# Patient Record
Sex: Male | Born: 1970 | Race: Black or African American | Hispanic: No | Marital: Single | Smoking: Light tobacco smoker
Health system: Southern US, Community
[De-identification: ages and names within clinical notes are randomized; demographics above are authoritative.]

## PROBLEM LIST (undated history)

## (undated) DIAGNOSIS — I5022 Chronic systolic (congestive) heart failure: Secondary | ICD-10-CM

## (undated) DIAGNOSIS — I10 Essential (primary) hypertension: Secondary | ICD-10-CM

## (undated) DIAGNOSIS — E669 Obesity, unspecified: Secondary | ICD-10-CM

## (undated) DIAGNOSIS — I429 Cardiomyopathy, unspecified: Secondary | ICD-10-CM

## (undated) DIAGNOSIS — I428 Other cardiomyopathies: Secondary | ICD-10-CM

## (undated) DIAGNOSIS — G4730 Sleep apnea, unspecified: Secondary | ICD-10-CM

## (undated) DIAGNOSIS — E785 Hyperlipidemia, unspecified: Secondary | ICD-10-CM

## (undated) HISTORY — DX: Chronic systolic (congestive) heart failure: I50.22

## (undated) HISTORY — DX: Hyperlipidemia, unspecified: E78.5

## (undated) HISTORY — PX: VASECTOMY: SHX75

## (undated) HISTORY — DX: Cardiomyopathy, unspecified: I42.9

## (undated) HISTORY — DX: Other cardiomyopathies: I42.8

## (undated) HISTORY — DX: Obesity, unspecified: E66.9

---

## 1998-11-18 ENCOUNTER — Emergency Department (HOSPITAL_COMMUNITY): Admission: EM | Admit: 1998-11-18 | Discharge: 1998-11-18 | Payer: Self-pay | Admitting: Emergency Medicine

## 2000-06-06 ENCOUNTER — Other Ambulatory Visit: Admission: RE | Admit: 2000-06-06 | Discharge: 2000-06-06 | Payer: Self-pay | Admitting: Family Medicine

## 2006-06-24 ENCOUNTER — Emergency Department (HOSPITAL_COMMUNITY): Admission: EM | Admit: 2006-06-24 | Discharge: 2006-06-24 | Payer: Self-pay | Admitting: Emergency Medicine

## 2012-12-22 ENCOUNTER — Encounter (HOSPITAL_COMMUNITY): Payer: Self-pay | Admitting: *Deleted

## 2012-12-22 ENCOUNTER — Inpatient Hospital Stay (HOSPITAL_COMMUNITY)
Admission: EM | Admit: 2012-12-22 | Discharge: 2012-12-26 | DRG: 293 | Disposition: A | Discharge status: Home Health | Payer: MEDICAID | Attending: Internal Medicine | Admitting: Internal Medicine

## 2012-12-22 ENCOUNTER — Emergency Department (INDEPENDENT_AMBULATORY_CARE_PROVIDER_SITE_OTHER)
Admission: EM | Admit: 2012-12-22 | Discharge: 2012-12-22 | Disposition: A | Discharge status: Nursing Home (Includes ICF) | Payer: Self-pay | Source: Home / Self Care

## 2012-12-22 ENCOUNTER — Emergency Department (HOSPITAL_COMMUNITY): Payer: Self-pay

## 2012-12-22 ENCOUNTER — Encounter (HOSPITAL_COMMUNITY): Payer: Self-pay | Admitting: Neurology

## 2012-12-22 DIAGNOSIS — R0609 Other forms of dyspnea: Secondary | ICD-10-CM

## 2012-12-22 DIAGNOSIS — I509 Heart failure, unspecified: Secondary | ICD-10-CM

## 2012-12-22 DIAGNOSIS — I10 Essential (primary) hypertension: Secondary | ICD-10-CM

## 2012-12-22 DIAGNOSIS — R011 Cardiac murmur, unspecified: Secondary | ICD-10-CM | POA: Diagnosis present

## 2012-12-22 DIAGNOSIS — F17200 Nicotine dependence, unspecified, uncomplicated: Secondary | ICD-10-CM

## 2012-12-22 DIAGNOSIS — R600 Localized edema: Secondary | ICD-10-CM

## 2012-12-22 DIAGNOSIS — R609 Edema, unspecified: Secondary | ICD-10-CM

## 2012-12-22 DIAGNOSIS — E876 Hypokalemia: Secondary | ICD-10-CM | POA: Diagnosis present

## 2012-12-22 DIAGNOSIS — R0600 Dyspnea, unspecified: Secondary | ICD-10-CM

## 2012-12-22 DIAGNOSIS — G4733 Obstructive sleep apnea (adult) (pediatric): Secondary | ICD-10-CM | POA: Diagnosis present

## 2012-12-22 DIAGNOSIS — R000 Tachycardia, unspecified: Secondary | ICD-10-CM

## 2012-12-22 DIAGNOSIS — Z720 Tobacco use: Secondary | ICD-10-CM | POA: Diagnosis present

## 2012-12-22 HISTORY — DX: Essential (primary) hypertension: I10

## 2012-12-22 HISTORY — DX: Sleep apnea, unspecified: G47.30

## 2012-12-22 LAB — COMPREHENSIVE METABOLIC PANEL
Albumin: 3.2 g/dL — ABNORMAL LOW (ref 3.5–5.2)
BUN: 15 mg/dL (ref 6–23)
Calcium: 9.2 mg/dL (ref 8.4–10.5)
Chloride: 105 mEq/L (ref 96–112)
Sodium: 141 mEq/L (ref 135–145)
Total Bilirubin: 0.9 mg/dL (ref 0.3–1.2)

## 2012-12-22 LAB — POCT I-STAT TROPONIN I

## 2012-12-22 LAB — CBC WITH DIFFERENTIAL/PLATELET
Eosinophils Relative: 1 % (ref 0–5)
HCT: 47 % (ref 39.0–52.0)
Hemoglobin: 15.4 g/dL (ref 13.0–17.0)
Lymphocytes Relative: 40 % (ref 12–46)
Lymphs Abs: 2.3 10*3/uL (ref 0.7–4.0)
MCH: 28.8 pg (ref 26.0–34.0)
Neutro Abs: 2.5 10*3/uL (ref 1.7–7.7)
RBC: 5.35 MIL/uL (ref 4.22–5.81)

## 2012-12-22 LAB — D-DIMER, QUANTITATIVE: D-Dimer, Quant: 2.56 ug/mL-FEU — ABNORMAL HIGH (ref 0.00–0.48)

## 2012-12-22 MED ORDER — FUROSEMIDE 10 MG/ML IJ SOLN
INTRAMUSCULAR | Status: AC
  Filled 2012-12-22: qty 2

## 2012-12-22 MED ORDER — FUROSEMIDE 10 MG/ML IJ SOLN
40.0000 mg | INTRAMUSCULAR | Status: AC
  Administered 2012-12-22: 40 mg via INTRAVENOUS
  Filled 2012-12-22: qty 4

## 2012-12-22 MED ORDER — SODIUM CHLORIDE 0.9 % IJ SOLN
3.0000 mL | Freq: Two times a day (BID) | INTRAMUSCULAR | Status: DC
Start: 2012-12-22 — End: 2012-12-26
  Administered 2012-12-22 – 2012-12-26 (×8): 3 mL via INTRAVENOUS

## 2012-12-22 MED ORDER — ACETAMINOPHEN 650 MG RE SUPP: 650.0000 mg | Freq: Four times a day (QID) | RECTAL | Status: DC | PRN

## 2012-12-22 MED ORDER — FUROSEMIDE 10 MG/ML IJ SOLN
40.0000 mg | Freq: Two times a day (BID) | INTRAMUSCULAR | Status: DC
  Administered 2012-12-23 – 2012-12-24 (×3): 40 mg via INTRAVENOUS
  Filled 2012-12-22 (×6): qty 4

## 2012-12-22 MED ORDER — ONDANSETRON HCL 4 MG/2ML IJ SOLN: 4.0000 mg | Freq: Four times a day (QID) | INTRAMUSCULAR | Status: DC | PRN

## 2012-12-22 MED ORDER — ACETAMINOPHEN 325 MG PO TABS: 650.0000 mg | ORAL_TABLET | Freq: Four times a day (QID) | ORAL | Status: DC | PRN

## 2012-12-22 MED ORDER — NITROGLYCERIN 2 % TD OINT
1.0000 [in_us] | TOPICAL_OINTMENT | Freq: Once | TRANSDERMAL | Status: AC
  Administered 2012-12-22: 1 [in_us] via TOPICAL
  Filled 2012-12-22: qty 1

## 2012-12-22 MED ORDER — NITROGLYCERIN IN D5W 200-5 MCG/ML-% IV SOLN
5.0000 ug/min | Freq: Once | INTRAVENOUS | Status: AC
  Administered 2012-12-22: 5 ug/min via INTRAVENOUS
  Filled 2012-12-22: qty 250

## 2012-12-22 MED ORDER — SODIUM CHLORIDE 0.9 % IV SOLN
Freq: Once | INTRAVENOUS | Status: AC
  Administered 2012-12-22: 11:00:00 via INTRAVENOUS

## 2012-12-22 MED ORDER — ONDANSETRON HCL 4 MG PO TABS: 4.0000 mg | ORAL_TABLET | Freq: Four times a day (QID) | ORAL | Status: DC | PRN

## 2012-12-22 MED ORDER — ASPIRIN 81 MG PO CHEW
324.0000 mg | CHEWABLE_TABLET | Freq: Once | ORAL | Status: AC
  Administered 2012-12-22: 324 mg via ORAL
  Filled 2012-12-22: qty 4

## 2012-12-22 MED ORDER — FUROSEMIDE 10 MG/ML IJ SOLN
40.0000 mg | Freq: Once | INTRAMUSCULAR | Status: AC
  Administered 2012-12-22: 40 mg via INTRAVENOUS

## 2012-12-22 MED ORDER — ENOXAPARIN SODIUM 40 MG/0.4ML ~~LOC~~ SOLN
40.0000 mg | SUBCUTANEOUS | Status: DC
  Administered 2012-12-22 – 2012-12-25 (×4): 40 mg via SUBCUTANEOUS
  Filled 2012-12-22 (×5): qty 0.4

## 2012-12-22 MED ORDER — FUROSEMIDE 10 MG/ML IJ SOLN
40.0000 mg | Freq: Two times a day (BID) | INTRAMUSCULAR | Status: DC
  Filled 2012-12-22: qty 4

## 2012-12-22 NOTE — ED Provider Notes (Signed)
History     CSN: [id number]  Arrival date & time 12/22/12  1134   First MD Initiated Contact with Patient 12/22/12 1152      Chief Complaint  Patient presents with  . Leg Swelling  . Shortness of Breath    (Consider location/radiation/quality/duration/timing/severity/associated sxs/prior treatment) HPI Comments: Patient is a 41 year old male with a history of hypertension who presents with a 1 month history of SOB and bilateral leg swelling. Symptoms started gradually and progressively worsened since the onset. Today, the symptoms became worse. Associated symptoms include abdominal bloating, orthopnea, and palpitations. Patient is not followed by a physician and denies any other medical history. No alleviating factors. Patient has not tried anything for symptoms.    Past Medical History  Diagnosis Date  . Hypertension     Past Surgical History  Procedure Date  . Vasectomy     No family history on file.  History  Substance Use Topics  . Smoking status: Current Every Day Smoker  . Smokeless tobacco: Not on file  . Alcohol Use: Yes      Review of Systems  Allergies  Review of patient's allergies indicates no known allergies.  Home Medications  No current outpatient prescriptions on file.  BP 158/93  Pulse 117  Temp 98.6 F (37 C) (Oral)  Resp 18  SpO2 99%  Physical Exam  Nursing note and vitals reviewed. Constitutional: He is oriented to person, place, and time. He appears well-developed and well-nourished. No distress.  HENT:  Head: Normocephalic and atraumatic.  Eyes: Conjunctivae normal and EOM are normal. Pupils are equal, round, and reactive to light. No scleral icterus.  Neck: Normal range of motion. Neck supple.  Cardiovascular: Normal rate and regular rhythm.  Exam reveals no gallop and no friction rub.   No murmur heard. Pulmonary/Chest: Effort normal. He has no wheezes. He has rales. He exhibits no tenderness.       Rales at bilateral lung  bases.   Abdominal: Soft. He exhibits distension. There is no tenderness. There is no rebound and no guarding.       Notable abdomen distension.   Musculoskeletal: Normal range of motion.  Neurological: He is alert and oriented to person, place, and time. Coordination normal.       Speech is goal-oriented. Moves limbs without ataxia.   Skin: Skin is warm and dry.  Psychiatric: He has a normal mood and affect. His behavior is normal.    ED Course  Procedures (including critical care time)  CRITICAL CARE Performed by: Emilia Beck   Total critical care time: 30 min  Critical care time was exclusive of separately billable procedures and treating other patients.  Critical care was necessary to treat or prevent imminent or life-threatening deterioration.  Critical care was time spent personally by me on the following activities: development of treatment plan with patient and/or surrogate as well as nursing, discussions with consultants, evaluation of patient's response to treatment, examination of patient, obtaining history from patient or surrogate, ordering and performing treatments and interventions, ordering and review of laboratory studies, ordering and review of radiographic studies, pulse oximetry and re-evaluation of patient's condition.   Labs Reviewed  CBC WITH DIFFERENTIAL - Abnormal; Notable for the following:    Monocytes Relative 15 (*)     All other components within normal limits  COMPREHENSIVE METABOLIC PANEL - Abnormal; Notable for the following:    Albumin 3.2 (*)     AST 91 (*)     ALT  69 (*)     GFR calc non Af Amer 78 (*)     All other components within normal limits  PRO B NATRIURETIC PEPTIDE - Abnormal; Notable for the following:    Pro B Natriuretic peptide (BNP) 2210.0 (*)     All other components within normal limits  D-DIMER, QUANTITATIVE - Abnormal; Notable for the following:    D-Dimer, Quant 2.56 (*)     All other components within normal limits    Dg Chest 2 View  12/22/2012  *RADIOLOGY REPORT*  Clinical Data: Leg swelling and shortness of breath.  CHEST - 2 VIEW  Comparison: None.  Findings: The heart is enlarged.  There is mild tortuosity and ectasia of the thoracic aorta.  There is central vascular congestion, perihilar pulmonary edema and small effusions consistent with CHF.  No infiltrates or pneumothorax.  The bony thorax is intact.  IMPRESSION: CHF.   Original Report Authenticated By: Rudie Meyer, M.D.      1. CHF (congestive heart failure)       MDM  12:01 PM Labs pending. Chest xray pending. Patient will have aspirin here.   3:58 PM Patient experiencing CHF with elevated BNP and concerning chest xray for CHF. I will call for admission.   4:10 PM Dr. Fonnie Jarvis will admit the patient.    Emilia Beck, PA-C 12/22/12 1610

## 2012-12-22 NOTE — Progress Notes (Signed)
Patient BP elevated on admission to 163/105. BP recheck 157/92. MD notified and no new orders placed. RN will continue to monitor. Louretta Parma, RN

## 2012-12-22 NOTE — ED Provider Notes (Signed)
Medical screening examination/treatment/procedure(s) were conducted as a shared visit with non-physician practitioner(s) and myself.  I personally evaluated the patient during the encounter.  Gradual onset DOE, weight gain, edema over last couple weeks, no Hx CHF, voided 3L in ED feeling better but still rales halfway up bilat and periph edema, await trop, plan admit request unassigned IM. RA sat 95% in ED after initial diuresis. 1605  D/w OPC for admit.  ECG: Sinus tachycardia, ventricular 14, right axis deviation, borderline R-wave progression, nonspecific T wave changes, no comparison ECG available  Hurman Horn, MD 12/24/12 2325

## 2012-12-22 NOTE — ED Notes (Signed)
Per EMS- Pt comes from [facility] c/o leg swelling and sob x 1 month. CXR showing fluid in lungs. Given 40 mg lasix. Pt alert and oriented. BP 156/114, HR 122, 99% RA.

## 2012-12-22 NOTE — H&P (Signed)
Hospital Admission Note Date: 12/22/2012  Patient name: Colin Walton Medical record number: [id number] Date of birth: 02/17/1971 Age: 41 y.o. Gender: male PCP: Default, Provider, MD  Medical Service: Internal Medicine Teaching Service  Attending physician:  Dr. Criselda Peaches   1st Contact:  Dr. Sherrine Maples   Pager: [phone] 2nd Contact:  Dr. Dorise Hiss   Pager: [phone] After 5 pm or weekends: 1st Contact:      Pager: [phone] 2nd Contact:      Pager: [phone]  Chief Complaint: New onset CHF  History of Present Illness: 41 year old male with PMH HTN presents with a 1 month history of SOB and bilateral leg swelling. Symptoms started gradually and progressively worsened since the onset. Sx began on Dec 7th with bilateral feet swelling that he thought was due to an allergic reaction. His edema persisted and he began to develop distention of his abdomen and SOB. His symptoms have become progressively worse, so he came to the ED. He endorses worsening orthopnea and states that last night he had to sleep sitting straight up. He has been sleeping sitting up for the past several nights and is not having any cough. He is not having any chest pain. He states that he has family history of heart problems but he is unable to clarify what those may be. He denies history of heart problems or heart attack. He states he has never been tested for sugar or cholesterol problems. He admits to drinking a 40 oz beer daily with "much more" on the weekends. He denies fevers, headaches, chest pain, cough, nausea, vomiting, change in bowel habits, burning on urination, or joint pain.     Meds: None  Allergies: Allergies as of 12/22/2012  . (No Known Allergies)   Past Medical History  Diagnosis Date  . Hypertension    Past Surgical History  Procedure Date  . Vasectomy    No family history on file. History   Social History  . Marital Status: Single    Spouse Name: N/A    Number of Children: N/A  . Years of  Education: N/A   Occupational History  . Not on file.   Social History Main Topics  . Smoking status: Current Every Day Smoker  . Smokeless tobacco: Not on file  . Alcohol Use: Yes  . Drug Use: Yes    Special: Marijuana  . Sexually Active:    Other Topics Concern  . Not on file   Social History Narrative  . No narrative on file    Review of Systems: A 10 point ROS was performed; pertinent positives and negatives were noted in the HPI   Physical Exam: Blood pressure 141/89, pulse 116, temperature 98.6 F (37 C), temperature source Oral, resp. rate 24, SpO2 94.00%. General: Alert & oriented x 3, well-developed, and cooperative on examination.  Head: Normocephalic and atraumatic.  Eyes: Vision grossly intact, pupils equal, round, and reactive to light.  Mouth: Pharynx pink and moist Neck: Supple, full ROM Lungs: Breath sounds diminished at the bases, increased respiratory effort, no accessory muscle use, no crackles, and no wheezes. Heart: Regular rate, regular rhythm, blowing systolic mid sternal murmur, no gallop, and no rub.  Abdomen: Distended, TTP at umbilicius, normal bowel sounds, no guarding, no rebound tenderness, no organomegaly, no palpable hernia.  Msk: No joint swelling, warmth, or erythema.  Extremities: 2+ radial and DP pulses bilaterally. 2+ pitting edema of BLE into feet. Neurologic: Alert & oriented X3, cranial nerves II-XII intact, strength normal in  all extremities Skin: Turgor normal and no rashes.  Psych: Memory intact for recent and remote, normally interactive, good eye contact   Lab results: Basic Metabolic Panel:  Basename 12/22/12 1210  NA 141  K 4.5  CL 105  CO2 24  GLUCOSE 94  BUN 15  CREATININE 1.15  CALCIUM 9.2  MG --  PHOS --   Liver Function Tests:  Basename 12/22/12 1210  AST 91*  ALT 69*  ALKPHOS 76  BILITOT 0.9  PROT 6.6  ALBUMIN 3.2*   CBC:  Basename 12/22/12 1210  WBC 5.7  NEUTROABS 2.5  HGB 15.4  HCT 47.0    MCV 87.9  PLT 226   Cardiac Enzymes: POCT troponin 0.05 (negative)  BNP:  Basename 12/22/12 1210  PROBNP 2210.0*   D-Dimer:  Basename 12/22/12 1210  DDIMER 2.56*   Imaging results:  Dg Chest 2 View  12/22/2012  *RADIOLOGY REPORT*  Clinical Data: Leg swelling and shortness of breath.  CHEST - 2 VIEW  Comparison: None.  Findings: The heart is enlarged.  There is mild tortuosity and ectasia of the thoracic aorta.  There is central vascular congestion, perihilar pulmonary edema and small effusions consistent with CHF.  No infiltrates or pneumothorax.  The bony thorax is intact.  IMPRESSION: CHF.   Original Report Authenticated By: Rudie Meyer, M.D.     Other results: EKG: there are no previous tracings available for comparison, normal sinus rhythm, poor R wave progression and slightly low voltage.  Assessment & Plan by Problem:   Acute CHF- Lasix 40 mg IV given in the ED produced good diuresis (3L) and he still has a lot of fluid additionally. Will also check TSH, HgA1c, ECHO, FLP. Will use 40 mg BID IV lasix to start and will titrate according to output. Will monitor I/O and daily weights. May get CHF team consult given new diagnosis. Unclear etiology although has had uncontrolled hypertension for a number of years and may have some significant alcohol consumption. There is a blowing murmur on exam. Pro BNP 2000. D Dimer positive however very low clinical suspicion of PE and clinically evident process to explain SOB. Wells score 1.5 for PE and very low probability. Saline lock IV and watch on tele overnight.    Hypertension - BP as high as 168/98 in the ED and given some nitro IV. Will start BP medication in the morning as needed. Current BP in the 140s/80s.    Tobacco abuse - 1 PPD smoker for 20 years. Ordered nursing smoking cessation consult. He appears to be pre-contemplative at this time.   Dispo: Disposition is deferred at this time, awaiting improvement of current medical  problems. Anticipated discharge in approximately 1-3 day(s).   The patient does not have a current PCP (Default, Provider, MD), therefore will be requiring OPC follow-up after discharge.   The patient does not have transportation limitations that hinder transportation to clinic appointments.  Signed: Genelle Gather 12/22/2012, 6:34 PM

## 2012-12-22 NOTE — ED Notes (Signed)
Started with BLE swelling on 12/6.  Over past several days has felt "full in my stomach", SOB, fast HR.  Crackles noted in bases.  Pitting edema BLE.  Denies hx.

## 2012-12-22 NOTE — ED Notes (Signed)
[facility] notified of transfer - truck unavailable.  [facility] en route.

## 2012-12-22 NOTE — ED Notes (Signed)
Pt returned from CT with a nosebleed. Study was not done due to nosebleed. At this time pt using gauze, applying pressure to attempt stop nosebleed.

## 2012-12-22 NOTE — ED Notes (Signed)
Called CT notified patient needing CT angio. Nose bleed has stopped at this time.

## 2012-12-22 NOTE — ED Provider Notes (Signed)
History     CSN: [id number]  Arrival date & time 12/22/12  1047   None     Chief Complaint  Patient presents with  . Shortness of Breath    (Consider location/radiation/quality/duration/timing/severity/associated sxs/prior treatment) HPI Comments: 41-year-old male with history of untreated, controlled hypertension that developed insidious onset of dyspnea and abdominal bloating associated with peripheral edema approximately 3 weeks ago. The symptoms is progressed through today when the dyspnea worsened. He is complaining of abdominal bloating, dyspnea, orthopnea, peripheral edema and palpitations. He has no physician and has not seen one in "a long time". No known history of CAD or other cardiopulmonary symptoms.   History reviewed. No pertinent past medical history.  History reviewed. No pertinent past surgical history.  No family history on file.  History  Substance Use Topics  . Smoking status: Current Every Day Smoker  . Smokeless tobacco: Not on file  . Alcohol Use:       Review of Systems  Constitutional: Positive for activity change and fatigue.  HENT: Negative.   Respiratory: Positive for shortness of breath.   Cardiovascular: Positive for palpitations and leg swelling. Negative for chest pain.  Gastrointestinal: Positive for abdominal distention.  Genitourinary: Negative.   Skin: Negative.   Neurological: Negative.     Allergies  Review of patient's allergies indicates no known allergies.  Home Medications  No current outpatient prescriptions on file.  BP 157/85  Pulse 124  Temp 97.4 F (36.3 C) (Oral)  Resp 18  SpO2 100%  Physical Exam  Constitutional: He is oriented to person, place, and time. He appears well-developed and well-nourished. He appears distressed.  Eyes: EOM are normal.  Neck: Normal range of motion. Neck supple.  Cardiovascular:  Murmur heard.      Tachycardia rate 130.   Pulmonary/Chest: He has no wheezes.       Respiratory  effort has increased. Tachypnea left basilar crackles.  Abdominal: He exhibits distension.       Abdomen is firm distended suspect ascites.  Musculoskeletal: He exhibits edema.  Neurological: He is alert and oriented to person, place, and time.  Skin: Skin is warm and dry. No erythema.    ED Course  Procedures (including critical care time)  Labs Reviewed - No data to display No results found.   1. Edema extremities   2. Tachycardia   3. Dyspnea   4. CHF (congestive heart failure)   5. HTN (hypertension)       MDM  Patient in apparent CHF upon arrival. At this point feeling was called for transport and an IV, O2 and monitor was started. The patient is alert, talkative with dyspnea but no chest discomfort. There is peripheral edema in distended tight abdomen probably represents ascites. Is administered Lasix 40 mg IV. He is transferred to the emergency department via [facility] EKG: Poor R-wave progression in the precordial lead nonspecific ST-T changes in V1 V2, Sinus tachycardia       Hayden Rasmussen, NP 12/22/12 1130  Hayden Rasmussen, NP 12/22/12 1132

## 2012-12-22 NOTE — ED Notes (Signed)
Report called to Lequita Halt, ED Charge RN.  Report given to [facility].

## 2012-12-22 NOTE — ED Provider Notes (Signed)
Medical screening examination/treatment/procedure(s) were performed by resident physician or non-physician practitioner and as supervising physician I was immediately available for consultation/collaboration.   KINDL,JAMES DOUGLAS MD.    James D Kindl, MD 12/22/12 1133 

## 2012-12-23 DIAGNOSIS — I359 Nonrheumatic aortic valve disorder, unspecified: Secondary | ICD-10-CM

## 2012-12-23 LAB — CBC
MCH: 27.6 pg (ref 26.0–34.0)
MCHC: 31.2 g/dL (ref 30.0–36.0)
Platelets: 266 10*3/uL (ref 150–400)
RBC: 4.71 MIL/uL (ref 4.22–5.81)

## 2012-12-23 LAB — LIPID PANEL
Cholesterol: 146 mg/dL (ref 0–200)
Triglycerides: 196 mg/dL — ABNORMAL HIGH (ref <150)

## 2012-12-23 LAB — BASIC METABOLIC PANEL
Calcium: 8.6 mg/dL (ref 8.4–10.5)
GFR calc Af Amer: >90 mL/min (ref >90)
GFR calc non Af Amer: 81 mL/min — ABNORMAL LOW (ref >90)
Glucose, Bld: 106 mg/dL — ABNORMAL HIGH (ref 70–99)
Sodium: 137 mEq/L (ref 135–145)

## 2012-12-23 LAB — HEMOGLOBIN A1C: Hgb A1c MFr Bld: 6.2 % — ABNORMAL HIGH (ref <5.7)

## 2012-12-23 LAB — TSH: TSH: 1.674 u[IU]/mL (ref 0.350–4.500)

## 2012-12-23 MED ORDER — HYDROCHLOROTHIAZIDE 25 MG PO TABS
25.0000 mg | ORAL_TABLET | Freq: Every day | ORAL | Status: DC
  Administered 2012-12-23 – 2012-12-24 (×2): 25 mg via ORAL
  Filled 2012-12-23 (×2): qty 1

## 2012-12-23 NOTE — Progress Notes (Signed)
Pt having small nose bleed. Likely d/t BP. Will continue to monitor.

## 2012-12-23 NOTE — Progress Notes (Signed)
Subjective: Pt states that he is feeling better today, and his breathing has improved. Pt going down for ECHO today.  Objective: Vital signs in last 24 hours: Filed Vitals:   12/22/12 2102 12/22/12 2229 12/23/12 0235 12/23/12 0428  BP: 157/92  142/82 154/96  Pulse: 108  86 108  Temp:   98.1 F (36.7 C) 98.3 F (36.8 C)  TempSrc:   Oral Oral  Resp:   18 17  Height:      Weight:   252 lb 3.3 oz (114.4 kg) 251 lb 15.8 oz (114.3 kg)  SpO2:  89% 93% 100%   Weight change:   Intake/Output Summary (Last 24 hours) at 12/23/12 0958 Last data filed at 12/23/12 0930  Gross per 24 hour  Intake    840 ml  Output   4450 ml  Net  -3610 ml   Vitals reviewed. General: Resting in bed, NAD Cardiac: RRR Pulm: Breath sounds diminished at the bases, no crackles, and no wheezes. Abd: Distended, TTP at umbilicius, normal bowel sounds Ext: BLE pitting edema Neuro: Alert and oriented X3, cranial nerves II-XII grossly intact  Lab Results: Basic Metabolic Panel:  Lab 12/23/12 1610 12/22/12 1210  NA 137 141  K 5.7* 4.5  CL 101 105  CO2 27 24  GLUCOSE 106* 94  BUN 15 15  CREATININE 1.12 1.15  CALCIUM 8.6 9.2  MG -- --  PHOS -- --   Liver Function Tests:  Lab 12/22/12 1210  AST 91*  ALT 69*  ALKPHOS 76  BILITOT 0.9  PROT 6.6  ALBUMIN 3.2*   CBC:  Lab 12/23/12 0515 12/22/12 1210  WBC 7.6 5.7  NEUTROABS -- 2.5  HGB 13.0 15.4  HCT 41.7 47.0  MCV 88.5 87.9  PLT 266 226   BNP:  Lab 12/22/12 1210  PROBNP 2210.0*   D-Dimer:  Lab 12/22/12 1210  DDIMER 2.56*   Hemoglobin A1C:  Lab 12/22/12 2044  HGBA1C 6.2*   Fasting Lipid Panel:  Lab 12/23/12 0515  CHOL 146  HDL 34*  LDLCALC 73  TRIG 960*  CHOLHDL 4.3  LDLDIRECT --   Thyroid Function Tests:  Lab 12/22/12 2044  TSH 1.674  T4TOTAL --  FREET4 --  T3FREE --  THYROIDAB --    Studies/Results: Dg Chest 2 View  12/22/2012  *RADIOLOGY REPORT*  Clinical Data: Leg swelling and shortness of breath.  CHEST - 2  VIEW  Comparison: None.  Findings: The heart is enlarged.  There is mild tortuosity and ectasia of the thoracic aorta.  There is central vascular congestion, perihilar pulmonary edema and small effusions consistent with CHF.  No infiltrates or pneumothorax.  The bony thorax is intact.  IMPRESSION: CHF.   Original Report Authenticated By: Rudie Meyer, M.D.    Medications: I have reviewed the patient's current medications. Scheduled Meds:   . enoxaparin (LOVENOX) injection  40 mg Subcutaneous Q24H  . furosemide  40 mg Intravenous BID  . sodium chloride  3 mL Intravenous Q12H   Continuous Infusions:  PRN Meds:.acetaminophen, acetaminophen, ondansetron (ZOFRAN) IV, ondansetron Assessment/Plan: 42 yo M with HTN presents with new onset CHF.  1. Acute CHF: Sxbegan Dec 7th per pt and have gradually worsened. Unsure if sx from uncontrolled HTN or possible EtOH use. Lasix 40 mg IV given in the ED produced good diuresis (3L). Started 40 mg BID IV Lasix with good response; will titrate as needed according to output. Checking ECHO today, and will consult CHF team, likely tomorrow.  - 40mg   IV Lasix BID - f/u ECHO - Consult CHF team  2. Hypertension: BP as high as 168/98 in the ED and pt given IV Nitro. BP remained elevated, will start on HCTZ today. Will continue to monitor. - HCTZ 25mg  daily  3. Tobacco abuse: 1 PPD smoker for 20 years. A nursing smoking cessation consult was ordered on admission.     Dispo: Disposition is deferred at this time, awaiting improvement of current medical problems.  Anticipated discharge in approximately 2 day(s).   The patient I have reviewed the patient's current medications. have a current PCP (Default, Provider, MD), therefore will possibly be requiring OPC follow-up after discharge.   The patient does not have transportation limitations that hinder transportation to clinic appointments.  .Services Needed at time of discharge: Y = Yes, Blank = No PT:   OT:     RN:   Equipment:   Other:     LOS: 1 day   Genelle Gather 12/23/2012, 9:58 AM

## 2012-12-23 NOTE — Progress Notes (Signed)
  Echocardiogram 2D Echocardiogram has been performed.  Colin Walton 12/23/2012, 9:40 AM

## 2012-12-23 NOTE — H&P (Signed)
Internal Medicine Teaching Service Attending Note Date: 12/23/2012  Patient name: Colin Walton  Medical record number: [id number]  Date of birth: February 06, 1971   I have seen and evaluated Colin Walton and discussed their care with the Residency Team.    Colin Walton is a 42yo male with PMH of HTN who presented to the [facility] ED complaining of a one month history of LE edema and SOB.  He reports that about 1 month ago, he began noticing LE edema which he attributed to eating a particularly salty meal.  However, over the month his edema worsened and he also developed abdominal distention/edema.  During this time he also began experiencing SOB which was worse with lying down and decreased exercise tolerance particularly with climbing stairs.  He also reports pillow orthopnea and most recently needing to sleep completely sitting up.  He also endorses occasional PND.  He denies any cough or chest pain.  He reports a family history of heart disease, but no history of sudden cardiac death or heart failure.  He drinks ETOH daily.  He was told he had HTN "many years ago."  He was on medication for a short amount of time, but was able to control his BP with diet and exercise so this medication was discontinued.  Since that time he has not been seen by and MD and does not know what his BP has been.    For further PMH, PSH, PFSH, meds, allergies, ROS, please see resident note.   Physical Exam: Blood pressure 163/96, pulse 110, temperature 98.3 F (36.8 C), temperature source Oral, resp. rate 17, height 5\' 6"  (1.676 m), weight 251 lb 15.8 oz (114.3 kg), SpO2 100.00%. General appearance: alert, cooperative, appears stated age and no distress Head: Normocephalic, without obvious abnormality, atraumatic, healed wound to forehead Eyes: conjunctivae/corneas clear. EOM's intact.  Lungs: Breath sounds diminished at bases, no rales, no wheezes Heart: + systolic murmur, RR, NR Abdomen: + distention, bowel sounds present,  NT Extremities: 2+ pitting edema to mid calf, no skin changes Pulses:difficult to palpate in LE due to edema Skin: no rash or wound  Lab results: Results for orders placed during the hospital encounter of 12/22/12 (from the past 24 hour(s))  CBC WITH DIFFERENTIAL     Status: Abnormal   Collection Time   12/22/12 12:10 PM      Component Value Range   WBC 5.7  4.0 - 10.5 K/uL   RBC 5.35  4.22 - 5.81 MIL/uL   Hemoglobin 15.4  13.0 - 17.0 g/dL   HCT 40.9  81.1 - 91.4 %   MCV 87.9  78.0 - 100.0 fL   MCH 28.8  26.0 - 34.0 pg   MCHC 32.8  30.0 - 36.0 g/dL   RDW 78.2  95.6 - 21.3 %   Platelets 226  150 - 400 K/uL   Neutrophils Relative 43  43 - 77 %   Neutro Abs 2.5  1.7 - 7.7 K/uL   Lymphocytes Relative 40  12 - 46 %   Lymphs Abs 2.3  0.7 - 4.0 K/uL   Monocytes Relative 15 (*) 3 - 12 %   Monocytes Absolute 0.9  0.1 - 1.0 K/uL   Eosinophils Relative 1  0 - 5 %   Eosinophils Absolute 0.1  0.0 - 0.7 K/uL   Basophils Relative 0  0 - 1 %   Basophils Absolute 0.0  0.0 - 0.1 K/uL  COMPREHENSIVE METABOLIC PANEL     Status: Abnormal  Collection Time   12/22/12 12:10 PM      Component Value Range   Sodium 141  135 - 145 mEq/L   Potassium 4.5  3.5 - 5.1 mEq/L   Chloride 105  96 - 112 mEq/L   CO2 24  19 - 32 mEq/L   Glucose, Bld 94  70 - 99 mg/dL   BUN 15  6 - 23 mg/dL   Creatinine, Ser 1.61  0.50 - 1.35 mg/dL   Calcium 9.2  8.4 - 09.6 mg/dL   Total Protein 6.6  6.0 - 8.3 g/dL   Albumin 3.2 (*) 3.5 - 5.2 g/dL   AST 91 (*) 0 - 37 U/L   ALT 69 (*) 0 - 53 U/L   Alkaline Phosphatase 76  39 - 117 U/L   Total Bilirubin 0.9  0.3 - 1.2 mg/dL   GFR calc non Af Amer 78 (*) >90 mL/min   GFR calc Af Amer >90  >90 mL/min  PRO B NATRIURETIC PEPTIDE     Status: Abnormal   Collection Time   12/22/12 12:10 PM      Component Value Range   Pro B Natriuretic peptide (BNP) 2210.0 (*) 0 - 125 pg/mL  D-DIMER, QUANTITATIVE     Status: Abnormal   Collection Time   12/22/12 12:10 PM      Component  Value Range   D-Dimer, Quant 2.56 (*) 0.00 - 0.48 ug/mL-FEU  POCT I-STAT TROPONIN I     Status: Normal   Collection Time   12/22/12  4:08 PM      Component Value Range   Troponin i, poc 0.05  0.00 - 0.08 ng/mL   Comment 3           TSH     Status: Normal   Collection Time   12/22/12  8:44 PM      Component Value Range   TSH 1.674  0.350 - 4.500 uIU/mL  HEMOGLOBIN A1C     Status: Abnormal   Collection Time   12/22/12  8:44 PM      Component Value Range   Hemoglobin A1C 6.2 (*) <5.7 %   Mean Plasma Glucose 131 (*) <117 mg/dL  BASIC METABOLIC PANEL     Status: Abnormal   Collection Time   12/23/12  5:15 AM      Component Value Range   Sodium 137  135 - 145 mEq/L   Potassium 5.7 (*) 3.5 - 5.1 mEq/L   Chloride 101  96 - 112 mEq/L   CO2 27  19 - 32 mEq/L   Glucose, Bld 106 (*) 70 - 99 mg/dL   BUN 15  6 - 23 mg/dL   Creatinine, Ser 0.45  0.50 - 1.35 mg/dL   Calcium 8.6  8.4 - 40.9 mg/dL   GFR calc non Af Amer 81 (*) >90 mL/min   GFR calc Af Amer >90  >90 mL/min  CBC     Status: Normal   Collection Time   12/23/12  5:15 AM      Component Value Range   WBC 7.6  4.0 - 10.5 K/uL   RBC 4.71  4.22 - 5.81 MIL/uL   Hemoglobin 13.0  13.0 - 17.0 g/dL   HCT 81.1  91.4 - 78.2 %   MCV 88.5  78.0 - 100.0 fL   MCH 27.6  26.0 - 34.0 pg   MCHC 31.2  30.0 - 36.0 g/dL   RDW 95.6  21.3 - 08.6 %  Platelets 266  150 - 400 K/uL  LIPID PANEL     Status: Abnormal   Collection Time   12/23/12  5:15 AM      Component Value Range   Cholesterol 146  0 - 200 mg/dL   Triglycerides 161 (*) <150 mg/dL   HDL 34 (*) >09 mg/dL   Total CHOL/HDL Ratio 4.3     VLDL 39  0 - 40 mg/dL   LDL Cholesterol 73  0 - 99 mg/dL    Imaging results:  Dg Chest 2 View  12/22/2012  *RADIOLOGY REPORT*  Clinical Data: Leg swelling and shortness of breath.  CHEST - 2 VIEW  Comparison: None.  Findings: The heart is enlarged.  There is mild tortuosity and ectasia of the thoracic aorta.  There is central vascular congestion,  perihilar pulmonary edema and small effusions consistent with CHF.  No infiltrates or pneumothorax.  The bony thorax is intact.  IMPRESSION: CHF.   Original Report Authenticated By: Rudie Meyer, M.D.     Assessment and Plan: I agree with the formulated Assessment and Plan with the following changes:   1. Acute CHF - TTE is pending to tell us whether this is more systolic or diastolic - Most likely cause is prolonged uncontrolled HTN, but this could be idiopathic or due to ETOH use as well, he denies any signs/symptoms of viral illness making this less likely - Lasix IV BID, he has had good response so far, transition to PO once weight stabilizes - Continue tele monitoring - Will likely need addition of beta blocker, ace-I as tolerated both for HF and BP control  2. HTN - Will start medications including ACE-I and BB as tolerated  Other issues as per resident note  Inez Catalina, MD 1/1/201411:46 AM

## 2012-12-23 NOTE — Progress Notes (Signed)
Pur Pt on RA, will continue to monitor. Instructed Pt to put [place] 2l back on when sleeping

## 2012-12-24 ENCOUNTER — Encounter (HOSPITAL_COMMUNITY): Payer: Self-pay | Admitting: *Deleted

## 2012-12-24 DIAGNOSIS — G4730 Sleep apnea, unspecified: Secondary | ICD-10-CM

## 2012-12-24 HISTORY — DX: Sleep apnea, unspecified: G47.30

## 2012-12-24 LAB — BASIC METABOLIC PANEL
BUN: 12 mg/dL (ref 6–23)
CO2: 31 mEq/L (ref 19–32)
Calcium: 9 mg/dL (ref 8.4–10.5)
Creatinine, Ser: 1.13 mg/dL (ref 0.50–1.35)
GFR calc non Af Amer: 80 mL/min — ABNORMAL LOW (ref >90)
Glucose, Bld: 129 mg/dL — ABNORMAL HIGH (ref 70–99)
Sodium: 138 mEq/L (ref 135–145)

## 2012-12-24 MED ORDER — CARVEDILOL 3.125 MG PO TABS
3.1250 mg | ORAL_TABLET | Freq: Two times a day (BID) | ORAL | Status: DC
  Administered 2012-12-24 – 2012-12-26 (×4): 3.125 mg via ORAL
  Filled 2012-12-24 (×6): qty 1

## 2012-12-24 MED ORDER — FUROSEMIDE 10 MG/ML IJ SOLN
60.0000 mg | Freq: Two times a day (BID) | INTRAMUSCULAR | Status: DC
  Administered 2012-12-24 – 2012-12-25 (×2): 60 mg via INTRAVENOUS
  Filled 2012-12-24 (×3): qty 6

## 2012-12-24 MED ORDER — ASPIRIN EC 81 MG PO TBEC
81.0000 mg | DELAYED_RELEASE_TABLET | Freq: Every day | ORAL | Status: DC
  Administered 2012-12-24 – 2012-12-26 (×3): 81 mg via ORAL
  Filled 2012-12-24 (×3): qty 1

## 2012-12-24 MED ORDER — METOPROLOL TARTRATE 12.5 MG HALF TABLET
12.5000 mg | ORAL_TABLET | Freq: Two times a day (BID) | ORAL | Status: DC
  Filled 2012-12-24 (×2): qty 1

## 2012-12-24 NOTE — Progress Notes (Addendum)
Subjective:    Patient states he is feeling well today, although his leg swelling and SOB at rest persist relatively unchanged (perhaps minimally improved per his report). He is comfortable, however, and denies any other complaints. Denies CP. Pt states he is not using the O2 per [facility] as he does not feel he needs it.    Interval Events: No acute events.   Objective:    Vital Signs:   Temp:  [98.1 F (36.7 C)-99.4 F (37.4 C)] 98.1 F (36.7 C) (01/02 0607) Pulse Rate:  [106-114] 106  (01/02 0940) Resp:  [18] 18  (01/02 0940) BP: (142-159)/(88-95) 142/89 mmHg (01/02 0940) SpO2:  [95 %-97 %] 96 % (01/02 0940) Weight:  [246 lb 12.8 oz (111.948 kg)] 246 lb 12.8 oz (111.948 kg) (01/02 0607) Last BM Date: 12/23/12  24-hour weight change: Weight change: -5 lb 6.5 oz (-2.452 kg)  Intake/Output:   Intake/Output Summary (Last 24 hours) at 12/24/12 1401 Last data filed at 12/24/12 1237  Gross per 24 hour  Intake    963 ml  Output   4850 ml  Net  -3887 ml      Physical Exam: General: Vital signs reviewed and noted. Well-developed, well-nourished, in no acute distress; alert, appropriate and cooperative throughout examination.  Lungs:  Normal respiratory effort. Diminished BS in bilateral bases. No rales/wheezes.  Heart: RRR. S1 and S2 normal without gallop, murmur, or rubs.  Abdomen:  BS normoactive. Soft, Nondistended, non-tender.  No masses or organomegaly.  Extremities: 2+ BLE pitting edema     Labs:  Basic Metabolic Panel:  Lab 12/24/12 1610 12/23/12 1110 12/23/12 0515 12/22/12 1210  NA 138 -- 137 141  K 3.5 4.2 5.7* 4.5  CL 98 -- 101 105  CO2 31 -- 27 24  GLUCOSE 129* -- 106* 94  BUN 12 -- 15 15  CREATININE 1.13 -- 1.12 1.15  CALCIUM 9.0 -- 8.6 9.2  MG -- -- -- --  PHOS -- -- -- --    Liver Function Tests:  Lab 12/22/12 1210  AST 91*  ALT 69*  ALKPHOS 76  BILITOT 0.9  PROT 6.6  ALBUMIN 3.2*    CBC:  Lab 12/23/12 0515 12/22/12 1210  WBC 7.6 5.7    NEUTROABS -- 2.5  HGB 13.0 15.4  HCT 41.7 47.0  MCV 88.5 87.9  PLT 266 226      Medications:    Infusions:    Scheduled Medications:    . enoxaparin (LOVENOX) injection  40 mg Subcutaneous Q24H  . furosemide  40 mg Intravenous BID  . hydrochlorothiazide  25 mg Oral Daily  . sodium chloride  3 mL Intravenous Q12H    PRN Medications: acetaminophen, acetaminophen, ondansetron (ZOFRAN) IV, ondansetron   Assessment/ Plan:   42 yo M with HTN admitted for new onset CHF.   1. Acute CHF: Echo reveals EF = 40-45%, with bilateral atrial enlargement (R>L), and elevated pulm artery pressure (39mm Hg). Pt has diuresed 7.6L since admission, and weight has decreased from 252 -> 246 lbs over ~24hrs. Given pt's presentation of new-onset CHF with unclear etiology, CHF team has been consulted. Pt will need to be started on b-blocker, ace-I, aspirin, and statin therapy. Will start low-dose b-blocker and ASA but hold off on ACE-I and statin in the acute setting. As pt still has considerable LE edema as well as SOB, will escalate diuresis given his Cr has remained stable at ~1.1. - incr IV lasix from 40mg  bid -> 60mg  bid -  coreg 3.125mg  bid - ASA 81mg  QD - await CHF recs  2. Hypertension: BP improved since admission after diuresis, currently ~150/80. Will d/c HCTZ as pt will be on long-term lasix given his new diagnosis of CHF. B-blocker will be instituted at low doses, per above. - d/c HCTZ - coreg 3.125mg  bid - inc lasix to 60mg  IV bid, per above  3. Tobacco abuse: 1 PPD smoker for 20 years. A nursing smoking cessation consult was ordered on admission.   Dispo: Disposition is deferred at this time, awaiting improvement of current medical problems. Anticipated discharge in approximately 2-3 day(s).  The patient I have reviewed the patient's current medications. have a current PCP (Default, Provider, MD), therefore will possibly be requiring OPC follow-up after discharge.  The patient does not  have transportation limitations that hinder transportation to clinic appointments.  .Services Needed at time of discharge: Y = Yes, Blank = No  PT:    OT:    RN:    Equipment:    Other:      Length of Stay: 2 day(s)   Signed: Elfredia Nevins, MD  PGY-1, Internal Medicine Resident Pager: [phone] (7AM-5PM) 12/24/2012, 2:01 PM

## 2012-12-24 NOTE — Progress Notes (Signed)
Internal Medicine Teaching Service Attending Note Date: 12/24/2012  Patient name: Colin Walton  Medical record number: [id number]  Date of birth: May 03, 1971    This patient has been seen and discussed with the house staff. Please see their note for complete details. I concur with their findings with the following additions/corrections: Patient is a 42 year old man with past medical history of hypertension who comes in with acute congestive heart failure and found to have an ejection fraction of 40-45% with pulmonary hypertension per echo. Patient has been diuresed very well since admission but continues to be short of breath and still has a lot of fluids in his extremities. We will continue to diurese him as his renal function allows. We will consult cardiology given that patient has a new onset of congestive heart failure. Patient has been tachycardic so the plan to add small amount of beta blockers today. Patient will need aspirin, statin, beta blockers and Lasix and will need to be evaluated for outpatient addition of ACE inhibitor, aldosterone antagonist and hydralazine/long-acting nitrate for optimal management of systolic congestive heart failure.  Lars Mage 12/24/2012, 11:32 AM

## 2012-12-24 NOTE — Plan of Care (Signed)
Problem: Phase I Progression Outcomes Goal: EF % per last Echo/documented,Core Reminder form on chart Outcome: Completed/Met Date Met:  12/24/12 EF 40-45% per ECHO performed on 12/23/12.

## 2012-12-25 LAB — BASIC METABOLIC PANEL
BUN: 9 mg/dL (ref 6–23)
Creatinine, Ser: 1.13 mg/dL (ref 0.50–1.35)
GFR calc Af Amer: >90 mL/min (ref >90)
GFR calc non Af Amer: 80 mL/min — ABNORMAL LOW (ref >90)
Glucose, Bld: 174 mg/dL — ABNORMAL HIGH (ref 70–99)
Potassium: 3.4 mEq/L — ABNORMAL LOW (ref 3.5–5.1)

## 2012-12-25 MED ORDER — POTASSIUM CHLORIDE CRYS ER 20 MEQ PO TBCR
40.0000 meq | EXTENDED_RELEASE_TABLET | Freq: Once | ORAL | Status: AC
  Administered 2012-12-25: 40 meq via ORAL
  Filled 2012-12-25: qty 2

## 2012-12-25 MED ORDER — FUROSEMIDE 40 MG PO TABS
40.0000 mg | ORAL_TABLET | Freq: Two times a day (BID) | ORAL | Status: DC
  Administered 2012-12-25 – 2012-12-26 (×2): 40 mg via ORAL
  Filled 2012-12-25 (×4): qty 1

## 2012-12-25 NOTE — Progress Notes (Signed)
Internal Medicine Teaching Service Attending Note Date: 12/25/2012  Patient name: Colin Walton  Medical record number: [id number]  Date of birth: 03-17-1971    This patient has been seen and discussed with the house staff. Please see their note for complete details. I concur with their findings with the following additions/corrections:   Patient seems to be on path and currently about -2.5 L since yesterday. Patient's renal function continues to tolerate diuresis. We added a low-dose beta blocker to her therapy yesterday. Patient's blood pressure and heart rate is currently able to tolerate the beta blocker and diuresis. We are awaiting cardiology recommendations at this time for this new onset heart failure.  Lars Mage 12/25/2012, 1:56 PM

## 2012-12-25 NOTE — ED Provider Notes (Signed)
Medical screening examination/treatment/procedure(s) were conducted as a shared visit with non-physician practitioner(s) and myself.  I personally evaluated the patient during the encounter  Patient with increased body edema, by CXR cw pulmonary edema and CHF, will treat with lasix IV and start NTG drip. Patient ended up diuresing significant amount of urine just with the lasix and NTG can be stopped. Initial concern for elevated D-dimer not clinically cw with presentation of CHF and suspect not relavent to PE. Will need admission.    CRITICAL CARE Performed by: Shelda Jakes.   Total critical care time: 30  Critical care time was exclusive of separately billable procedures and treating other patients.  Critical care was necessary to treat or prevent imminent or life-threatening deterioration.  Critical care was time spent personally by me on the following activities: development of treatment plan with patient and/or surrogate as well as nursing, discussions with consultants, evaluation of patient's response to treatment, examination of patient, obtaining history from patient or surrogate, ordering and performing treatments and interventions, ordering and review of laboratory studies, ordering and review of radiographic studies, pulse oximetry and re-evaluation of patient's condition.   Shelda Jakes, MD 12/25/12 2033

## 2012-12-25 NOTE — Progress Notes (Signed)
Subjective:    Pt states his SOB and leg swelling are improved today, and that he is feeling better.   Interval Events: No acute events.   Objective:    Vital Signs:   Temp:  [98.4 F (36.9 C)-99.2 F (37.3 C)] 99.1 F (37.3 C) (01/03 0432) Pulse Rate:  [106-108] 106  (01/03 0900) Resp:  [18-20] 18  (01/03 0432) BP: (131-153)/(70-88) 153/88 mmHg (01/03 0900) SpO2:  [91 %-100 %] 100 % (01/03 0432) Weight:  [239 lb 1.6 oz (108.455 kg)] 239 lb 1.6 oz (108.455 kg) (01/03 0432) Last BM Date: 12/24/12  24-hour weight change: Weight change: -7 lb 11.2 oz (-3.493 kg)  Intake/Output:   Intake/Output Summary (Last 24 hours) at 12/25/12 1136 Last data filed at 12/25/12 0816  Gross per 24 hour  Intake    723 ml  Output   3025 ml  Net  -2302 ml      Physical Exam: General: Vital signs reviewed and noted. Well-developed, well-nourished, in no acute distress; alert, appropriate and cooperative throughout examination.  Lungs:  Normal respiratory effort. Bibasilar rales.   Heart: RRR. 3/6 systolic murmur.  Abdomen:  BS normoactive. Soft, Nondistended, non-tender.  No masses or organomegaly.  Extremities: 2+ BLE edema     Labs:  Basic Metabolic Panel:  Lab 12/25/12 6962 12/24/12 0540 12/23/12 1110 12/23/12 0515 12/22/12 1210  NA 137 138 -- 137 141  K 3.4* 3.5 4.2 5.7* 4.5  CL 95* 98 -- 101 105  CO2 32 31 -- 27 24  GLUCOSE 174* 129* -- 106* 94  BUN 9 12 -- 15 15  CREATININE 1.13 1.13 -- 1.12 1.15  CALCIUM 9.0 9.0 -- 8.6 --  MG -- -- -- -- --  PHOS -- -- -- -- --    Liver Function Tests:  Lab 12/22/12 1210  AST 91*  ALT 69*  ALKPHOS 76  BILITOT 0.9  PROT 6.6  ALBUMIN 3.2*   CBC:  Lab 12/23/12 0515 12/22/12 1210  WBC 7.6 5.7  NEUTROABS -- 2.5  HGB 13.0 15.4  HCT 41.7 47.0  MCV 88.5 87.9  PLT 266 226      Medications:    Infusions:    Scheduled Medications:    . aspirin EC  81 mg Oral Daily  . carvedilol  3.125 mg Oral BID WC  . enoxaparin  (LOVENOX) injection  40 mg Subcutaneous Q24H  . furosemide  60 mg Intravenous BID  . sodium chloride  3 mL Intravenous Q12H    PRN Medications: acetaminophen, acetaminophen, ondansetron (ZOFRAN) IV, ondansetron   Assessment/ Plan:   42 yo M with HTN admitted for new onset CHF.   Acute CHF - Echo reveals EF = 40-45%, with bilateral atrial enlargement (R>L), and elevated pulm artery pressure (39mm Hg). Pt has diuresed 9.3L since admission, and weight has decreased from 252 -> 239 lbs over ~48hrs. Given pt's presentation of new-onset CHF with unclear etiology, CHF team has been consulted. Pt will need to be started on b-blocker, ace-I, aspirin, and statin therapy. Will start low-dose b-blocker and ASA but hold off on ACE-I and statin in the acute setting. As SOB and LE edema are improved, will switch pt to oral lasix in anticipation of discharge in the next 24-48 hrs.  - lasix 40mg  PO bid - coreg 3.125mg  bid  - ASA 81mg  QD  - await CHF recs   Hypertension -  BP improved since admission after diuresis, currently ~130-150/80.  - coreg 3.125mg  bid  -  lasix 40mg  PO bid   Hypokalemia - K = 3.4 today, 2/2 lasix diuresis.  - PO KCl   Tobacco abuse - 1 PPD smoker for 20 years. A nursing smoking cessation consult was ordered on admission.   Dispo: Disposition is deferred at this time, awaiting improvement of current medical problems. Anticipated discharge in approximately 1-2 day(s).  The patient I have reviewed the patient's current medications. have a current PCP (Default, Provider, MD), therefore will possibly be requiring OPC follow-up after discharge.  The patient does not have transportation limitations that hinder transportation to clinic appointments.  .Services Needed at time of discharge: Y = Yes, Blank = No  PT:    OT:    RN:    Equipment:    Other:       Length of Stay: 3 day(s)   Signed: Elfredia Nevins, MD  PGY-1, Internal Medicine Resident Pager: [phone]  (7AM-5PM) 12/25/2012, 11:36 AM

## 2012-12-26 DIAGNOSIS — E876 Hypokalemia: Secondary | ICD-10-CM

## 2012-12-26 LAB — BASIC METABOLIC PANEL
BUN: 11 mg/dL (ref 6–23)
Calcium: 9.3 mg/dL (ref 8.4–10.5)
Chloride: 95 mEq/L — ABNORMAL LOW (ref 96–112)
Creatinine, Ser: 1.23 mg/dL (ref 0.50–1.35)
GFR calc Af Amer: 83 mL/min — ABNORMAL LOW (ref >90)

## 2012-12-26 MED ORDER — CARVEDILOL 3.125 MG PO TABS: 3.1250 mg | ORAL_TABLET | Freq: Two times a day (BID) | ORAL | Status: DC

## 2012-12-26 MED ORDER — ASPIRIN 81 MG PO TBEC: 81.0000 mg | DELAYED_RELEASE_TABLET | Freq: Every day | ORAL | Status: DC

## 2012-12-26 MED ORDER — FUROSEMIDE 40 MG PO TABS: 40.0000 mg | ORAL_TABLET | Freq: Every day | ORAL | Status: DC

## 2012-12-26 NOTE — Plan of Care (Signed)
Problem: Food- and Nutrition-Related Knowledge Deficit (NB-1.1) Goal: Nutrition education Formal process to instruct or train a patient/client in a skill or to impart knowledge to help patients/clients voluntarily manage or modify food choices and eating behavior to maintain or improve health.  Outcome: Completed/Met Date Met:  12/26/12 Nutrition Education Note  RD consulted for nutrition education regarding new onset CHF.  RD provided "Low Sodium Nutrition Therapy" handout from the Academy of [employer]. Reviewed patient's dietary recall. Provided examples on ways to decrease sodium intake in diet. Discouraged intake of processed foods and use of salt shaker. Encouraged fresh fruits and vegetables as well as whole grain sources of carbohydrates to maximize fiber intake.   RD discussed why it is important for patient to adhere to diet recommendations, and emphasized the role of fluids, foods to avoid, and importance of weighing self daily. Teach back method used.  Expect good compliance.  Body mass index is 38.11 kg/(m^2). Pt meets criteria for class 2 obesity based on current BMI.  Current diet order is low sodium, patient is consuming 100% of meals at this time. Labs and medications reviewed. No further nutrition interventions warranted at this time. RD contact information provided. If additional nutrition issues arise, please re-consult RD.   Colin Walton, RD, LDN, CNSC Pager# [phone] After Hours Pager# [phone]

## 2012-12-26 NOTE — Progress Notes (Addendum)
NCM spoke to pt and states he did receive information from [facility]. NCM explained he can pick up his medications from [facility] from $4 meds. States he does work, but currently without drug coverage or insurance. He will pick up scale and meds from [facility]. Provided pt with clinics that accept self pay patients. Made pt aware that [facility] agency cannot follow him for the CHF if he does not have a PCP. Explained the importance of follow up and scheduling an appt with MD post d/c from hospital. States he will call Monday to make appt with [facility].  NCM did follow up with Unit RN, [place] to make her aware information was added to dc instructions. Isidoro Donning [facility] Case Management phone [phone]

## 2012-12-26 NOTE — Progress Notes (Signed)
Subjective:    Pt's SOB and complaints of LE swelling continue to improve. He denies CP. States he is ready to go home.  Interval Events: No acute events.    Objective:    Vital Signs:   Temp:  [98.3 F (36.8 C)-98.6 F (37 C)] 98.3 F (36.8 C) (01/04 0810) Pulse Rate:  [102-109] 102  (01/04 0810) Resp:  [20-22] 20  (01/04 0810) BP: (128-153)/(70-91) 141/80 mmHg (01/04 0810) SpO2:  [96 %-100 %] 100 % (01/04 0810) Weight:  [236 lb 1.8 oz (107.1 kg)] 236 lb 1.8 oz (107.1 kg) (01/04 0414) Last BM Date: 12/26/12  24-hour weight change: Weight change: -2 lb 15.8 oz (-1.355 kg)  Intake/Output:   Intake/Output Summary (Last 24 hours) at 12/26/12 0910 Last data filed at 12/26/12 0827  Gross per 24 hour  Intake   1203 ml  Output   3175 ml  Net  -1972 ml      Physical Exam: General:  Vital signs reviewed and noted. Well-developed, well-nourished, in no acute distress; alert, appropriate and cooperative throughout examination.  Lungs:  Normal respiratory effort. No rales. Heart:  RRR. 3/6 systolic murmur.  Abdomen:  BS normoactive. Soft, Nondistended, non-tender. No masses or organomegaly.  Extremities:  2+ BLE edema   Labs:  Basic Metabolic Panel:  Lab 12/26/12 1610 12/25/12 0845 12/24/12 0540 12/23/12 1110 12/23/12 0515 12/22/12 1210  NA 136 137 138 -- 137 141  K 3.5 3.4* 3.5 4.2 5.7* --  CL 95* 95* 98 -- 101 105  CO2 33* 32 31 -- 27 24  GLUCOSE 138* 174* 129* -- 106* 94  BUN 11 9 12  -- 15 15  CREATININE 1.23 1.13 1.13 -- 1.12 1.15  CALCIUM 9.3 9.0 9.0 -- -- --  MG -- -- -- -- -- --  PHOS -- -- -- -- -- --    Liver Function Tests:  Lab 12/22/12 1210  AST 91*  ALT 69*  ALKPHOS 76  BILITOT 0.9  PROT 6.6  ALBUMIN 3.2*    CBC:  Lab 12/23/12 0515 12/22/12 1210  WBC 7.6 5.7  NEUTROABS -- 2.5  HGB 13.0 15.4  HCT 41.7 47.0  MCV 88.5 87.9  PLT 266 226      Medications:    Scheduled Medications:    . aspirin EC  81 mg Oral Daily  .  carvedilol  3.125 mg Oral BID WC  . enoxaparin (LOVENOX) injection  40 mg Subcutaneous Q24H  . furosemide  40 mg Oral BID  . sodium chloride  3 mL Intravenous Q12H    PRN Medications: acetaminophen, acetaminophen, ondansetron (ZOFRAN) IV, ondansetron   Assessment/ Plan:   42 yo M with HTN admitted for new onset CHF.   Acute CHF - Echo reveals EF = 40-45%, with bilateral atrial enlargement (R>L), and elevated pulm artery pressure (39mm Hg). Pt has diuresed 11.3L since admission, and weight has decreased from 252 -> 236 lbs over ~72hrs. Given pt's presentation of new-onset CHF with unclear etiology, CHF team has been consulted. Pt will need to be started on b-blocker, ace-I, aspirin, and statin therapy. Will start low-dose b-blocker and ASA but hold off on ACE-I and statin in the acute setting. Continues to diurese well after being switched to PO lasix.   Hypertension - BP stable, currently ~140/80.   Hypokalemia - Resolved. K = 3.5 today.   Tobacco abuse - 1 PPD smoker for 20 years. A nursing smoking cessation consult was ordered on admission.   Dispo: Pt  will be discharged home today. He will be prescribed lasix 40mg  QD, coreg, and ASA, and will f/u with cardiology and also with the [facility] to establish with a PCP. The patient does not have transportation limitations that hinder transportation to clinic appointments.  .Services Needed at time of discharge: Y = Yes, Blank = No  PT:    OT:    RN:    Equipment:    Other:       Length of Stay: 4 day(s)   Signed: Elfredia Nevins, MD  PGY-1, Internal Medicine Resident Pager: [phone] (7AM-5PM) 12/26/2012, 9:10 AM

## 2012-12-26 NOTE — Progress Notes (Signed)
Patient discharged with all discharge paperwork and prescriptions. Patient verbalizes understanding of discharge paperwork. Anastasia Fiedler RN

## 2012-12-26 NOTE — Discharge Summary (Signed)
Patient Name:  Colin Walton MRN: [id number]  PCP: Provider Default, MD DOB:  [birth year]       Date of Admission:  12/22/2012  Date of Discharge:  12/26/2012      Attending Physician: Dr. Lars Mage, MD         DISCHARGE DIAGNOSES: Acute CHF Hypertension Hypokalemia Tobacco abuse   DISPOSITION AND FOLLOW-UP: Colin Walton is to follow-up with the listed providers as detailed below, at which time, the following should be addressed:   1. F/u on pt's recently diagnosed CHF, with further investigation into underlying etiology (sleep study, referral to cardiology) 2. F/u on pt's recent hypokalemia he experienced after starting diuretic therapy 3. F/u on BP. Pt was started on coreg and lasix during hospital course (for CHF). He will need to start ACE-I therapy at time of f/u.  4. Labs / imaging needed: BMET 5. Pending labs/ test needing follow-up: N/A  Follow-up Information    Schedule an appointment as soon as possible for a visit with Default, Provider, MD.   Contact information:   [address] [place] [address] [phone]         Discharge Orders    Future Orders Please Complete By Expires   Diet - low sodium heart healthy      Increase activity slowly          DISCHARGE MEDICATIONS:   Medication List     As of 12/26/2012  9:56 AM    TAKE these medications         aspirin 81 MG EC tablet   Take 1 tablet (81 mg total) by mouth daily.      carvedilol 3.125 MG tablet   Commonly known as: COREG   Take 1 tablet (3.125 mg total) by mouth 2 (two) times daily with a meal.      furosemide 40 MG tablet   Commonly known as: LASIX   Take 1 tablet (40 mg total) by mouth daily.         CONSULTS:  CHF Team   PROCEDURES PERFORMED:  Dg Chest 2 View  12/22/2012  *RADIOLOGY REPORT*  Clinical Data: Leg swelling and shortness of breath.  CHEST - 2 VIEW  Comparison: None.  Findings: The heart is enlarged.  There is mild tortuosity and ectasia of the  thoracic aorta.  There is central vascular congestion, perihilar pulmonary edema and small effusions consistent with CHF.  No infiltrates or pneumothorax.  The bony thorax is intact.  IMPRESSION: CHF.   Original Report Authenticated By: Rudie Meyer, M.D.        ADMISSION DATA: H&P: 42 year old male with PMH HTN presents with a 1 month history of SOB and bilateral leg swelling. Symptoms started gradually and progressively worsened since the onset. Sx began on Dec 7th with bilateral feet swelling that he thought was due to an allergic reaction. His edema persisted and he began to develop distention of his abdomen and SOB. His symptoms have become progressively worse, so he came to the ED. He endorses worsening orthopnea and states that last night he had to sleep sitting straight up. He has been sleeping sitting up for the past several nights and is not having any cough. He is not having any chest pain. He states that he has family history of heart problems but he is unable to clarify what those may be. He denies history of heart problems or heart attack. He states he has never been  tested for sugar or cholesterol problems. He admits to drinking a 40 oz beer daily with "much more" on the weekends. He denies fevers, headaches, chest pain, cough, nausea, vomiting, change in bowel habits, burning on urination, or joint pain.   Physical Exam: Blood pressure 141/89, pulse 116, temperature 98.6 F (37 C), temperature source Oral, resp. rate 24, SpO2 94.00%.  General: Alert & oriented x 3, well-developed, and cooperative on examination.  Head: Normocephalic and atraumatic.  Eyes: Vision grossly intact, pupils equal, round, and reactive to light.  Mouth: Pharynx pink and moist Neck: Supple, full ROM Lungs: Breath sounds diminished at the bases, increased respiratory effort, no accessory muscle use, no crackles, and no wheezes. Heart: Regular rate, regular rhythm, blowing systolic mid sternal murmur, no  gallop, and no rub.  Abdomen: Distended, TTP at umbilicius, normal bowel sounds, no guarding, no rebound tenderness, no organomegaly, no palpable hernia.  Msk: No joint swelling, warmth, or erythema.  Extremities: 2+ radial and DP pulses bilaterally. 2+ pitting edema of BLE into feet. Neurologic: Alert & oriented X3, cranial nerves II-XII intact, strength normal in all extremities  Skin: Turgor normal and no rashes.  Psych: Memory intact for recent and remote, normally interactive, good eye contact      Labs: Basic Metabolic Panel:   Basename  12/22/12 1210   NA  141   K  4.5   CL  105   CO2  24   GLUCOSE  94   BUN  15   CREATININE  1.15   CALCIUM  9.2   MG  --   PHOS  --    Liver Function Tests:   Basename  12/22/12 1210   AST  91*   ALT  69*   ALKPHOS  76   BILITOT  0.9   PROT  6.6   ALBUMIN  3.2*    CBC:   Basename  12/22/12 1210   WBC  5.7   NEUTROABS  2.5   HGB  15.4   HCT  47.0   MCV  87.9   PLT  226    Cardiac Enzymes:  POCT troponin 0.05 (negative)  BNP:   Basename  12/22/12 1210   PROBNP  2210.0*    D-Dimer:   Alvira Philips  12/22/12 1210   DDIMER  2.56*      HOSPITAL COURSE: Acute CHF - Echo reveals EF = 40-45%, with bilateral atrial enlargement (R>L), and elevated pulm artery pressure (39mm Hg). Given pt's body habitus and elevated PA pressures, it appears he likely has OSA, which is likely contributing to or causing his CHF. Pt was started on IV lasix and diuresed 11.3L during his hospital course. The SOB and LE edema he complained of on admission were much improved at time of discharge.  Given pt's presentation of new-onset CHF with unclear etiology, CHF team was consulted for patient education. Pt was started on b-blocker and aspirin therapy. His LDL is at goal, so no statin was started during course (however this could be considered at f/u if deemed warranted at that time). ACE-I was not started during course so as to avoid overwhelming pt with  medications, however this should be started at pt's follow-up visit.  He was transitioned to PO lasix prior to discharge, and was sent home on 40mg  lasix QD. Pt should have a sleep study performed as an outpatient, and should be referred to cardiology for further evaluation/management of his newly-diagnosed CHF.   Hypertension - BP slightly elevated during  course, likely 2/2 hypervolemia. This issue should continue to improve as pt continues to diurese. Pt will need to be started on an ACE-I at hospital f/u with the [facility].   Hypokalemia - Mild hypokalemia during course, 2/2 starting diuretic therapy. K = 3.5 at discharge. Issue should be reassessed with BMET at f/u as pt was discharged on lasix.   Tobacco abuse - 1 PPD smoker for 20 years. Pt received smoking cessation counseling during hospital course.  DISCHARGE DATA: Vital Signs: BP 141/80  Pulse 102  Temp 98.3 F (36.8 C) (Oral)  Resp 20  Ht 5\' 6"  (1.676 m)  Wt 236 lb 1.8 oz (107.1 kg)  BMI 38.11 kg/m2  SpO2 100%  Labs: Results for orders placed during the hospital encounter of 12/22/12 (from the past 24 hour(s))  BASIC METABOLIC PANEL     Status: Abnormal   Collection Time   12/26/12  5:00 AM      Component Value Range   Sodium 136  135 - 145 mEq/L   Potassium 3.5  3.5 - 5.1 mEq/L   Chloride 95 (*) 96 - 112 mEq/L   CO2 33 (*) 19 - 32 mEq/L   Glucose, Bld 138 (*) 70 - 99 mg/dL   BUN 11  6 - 23 mg/dL   Creatinine, Ser 8.65  0.50 - 1.35 mg/dL   Calcium 9.3  8.4 - 78.4 mg/dL   GFR calc non Af Amer 72 (*) >90 mL/min   GFR calc Af Amer 83 (*) >90 mL/min     Time spent on discharge: 40 minutes  Services Ordered on Discharge: Y = Yes; Blank = No PT:   OT:   RN:   Equipment:   Other:     Signed: Elfredia Nevins, MD   PGY-1, Internal Medicine Resident 12/26/2012, 9:56 AM

## 2012-12-28 NOTE — Progress Notes (Signed)
Utilization Review Completed.   Cherokee Clowers, RN, BSN Nurse Case Manager  [phone]  

## 2012-12-30 ENCOUNTER — Telehealth: Payer: Self-pay | Admitting: *Deleted

## 2012-12-30 NOTE — Telephone Encounter (Signed)
Pt told HHN that he was declining visit today, he told her possibly Friday would be good

## 2013-01-05 ENCOUNTER — Ambulatory Visit: Payer: Self-pay | Admitting: Internal Medicine

## 2013-01-05 ENCOUNTER — Encounter: Payer: Self-pay | Admitting: Internal Medicine

## 2013-01-05 ENCOUNTER — Ambulatory Visit (INDEPENDENT_AMBULATORY_CARE_PROVIDER_SITE_OTHER): Payer: Self-pay | Admitting: Internal Medicine

## 2013-01-05 VITALS — BP 132/81 | HR 100 | Temp 98.3°F | Ht 66.0 in | Wt 230.4 lb

## 2013-01-05 DIAGNOSIS — Z0000 Encounter for general adult medical examination without abnormal findings: Secondary | ICD-10-CM

## 2013-01-05 DIAGNOSIS — Z720 Tobacco use: Secondary | ICD-10-CM

## 2013-01-05 DIAGNOSIS — F17200 Nicotine dependence, unspecified, uncomplicated: Secondary | ICD-10-CM

## 2013-01-05 DIAGNOSIS — Z23 Encounter for immunization: Secondary | ICD-10-CM

## 2013-01-05 DIAGNOSIS — I509 Heart failure, unspecified: Secondary | ICD-10-CM

## 2013-01-05 DIAGNOSIS — G4733 Obstructive sleep apnea (adult) (pediatric): Secondary | ICD-10-CM | POA: Insufficient documentation

## 2013-01-05 DIAGNOSIS — R040 Epistaxis: Secondary | ICD-10-CM | POA: Insufficient documentation

## 2013-01-05 LAB — BASIC METABOLIC PANEL WITH GFR
CO2: 27 mEq/L (ref 19–32)
Calcium: 9.2 mg/dL (ref 8.4–10.5)
Creat: 1.11 mg/dL (ref 0.50–1.35)
GFR, Est African American: >89 mL/min
Glucose, Bld: 104 mg/dL — ABNORMAL HIGH (ref 70–99)
Sodium: 137 mEq/L (ref 135–145)

## 2013-01-05 MED ORDER — CARVEDILOL 3.125 MG PO TABS: 3.1250 mg | ORAL_TABLET | Freq: Two times a day (BID) | ORAL | Status: DC

## 2013-01-05 MED ORDER — FUROSEMIDE 40 MG PO TABS: 40.0000 mg | ORAL_TABLET | Freq: Every day | ORAL | Status: DC

## 2013-01-05 MED ORDER — LISINOPRIL 5 MG PO TABS: 5.0000 mg | ORAL_TABLET | Freq: Every day | ORAL | Status: DC

## 2013-01-05 NOTE — Patient Instructions (Signed)
1. I added another medications, lisinopril, 5 mg daily to your regimen. This is for your congestive heart failure. We will make an appointment for you to see cardiologist. We will also make an appointment for you to do a sleep study. Please did not miss these appointments. We will let you to see our social worker for application of orange card.  2. Please take all medications as prescribed.  3. If you have worsening of your symptoms or new symptoms arise, please call the clinic ([phone]), or go to the ER immediately if symptoms are severe.  Heart Failure  Heart failure (HF) is a condition in which the heart has trouble pumping blood. This means your heart does not pump blood efficiently for your body to work well. In some cases of HF, fluid may back up into your lungs or you may have swelling (edema) in your lower legs. HF is a long-term (chronic) condition. It is important for you to take good care of yourself and follow your caregiver's treatment plan. CAUSES   Health conditions:  High blood pressure (hypertension) causes the heart muscle to work harder than normal. When pressure in the blood vessels is high, the heart needs to pump (contract) with more force in order to circulate blood throughout the body. High blood pressure eventually causes the heart to become stiff and weak.  Coronary artery disease (CAD) is the buildup of cholesterol and fat (plaques) in the arteries of the heart. The blockage in the arteries deprives the heart muscle of oxygen and blood. This can cause chest pain and may lead to a heart attack. High blood pressure can also contribute to CAD.  Heart attack (myocardial infarction) occurs when 1 or more arteries in the heart become blocked. The loss of oxygen damages the muscle tissue of the heart. When this happens, part of the heart muscle dies. The injured tissue does not contract as well and weakens the heart's ability to pump blood.  Abnormal heart valves can cause HF  when the heart valves do not open and close properly. This makes the heart muscle pump harder to keep the blood flowing.  Heart muscle disease (cardiomyopathy or myocarditis) is damage to the heart muscle from a variety of causes. These can include drug or alcohol abuse, infections, or unknown reasons. These can increase the risk of HF.  Lung disease makes the heart work harder because the lungs do not work properly. This can cause a strain on the heart leading it to fail.  Diabetes increases the risk of HF. High blood sugar contributes to high fat (lipid) levels in the blood. Diabetes can also cause slow damage to tiny blood vessels that carry important nutrients to the heart muscle. When the heart does not get enough oxygen and food, it can cause the heart to become weak and stiff. This leads to a heart that does not contract efficiently.  Other diseases can contribute to HF. These include abnormal heart rhythms, thyroid problems, and low blood counts (anemia).  Unhealthy lifestyle habits:  Obesity.  Smoking.  Eating foods high in fat and cholesterol.  Eating or drinking beverages high in salt.  Drug or alcohol abuse.  Lack of exercise. SYMPTOMS  HF symptoms may vary and can be hard to detect. Symptoms may include:  Shortness of breath with activity, such as climbing stairs.  Persistent cough.  Swelling of the feet, ankles, legs, or abdomen.  Unexplained weight gain.  Difficulty breathing when lying flat.  Waking from sleep because  of the need to sit up and get more air.  Rapid heartbeat.  Fatigue and loss of energy.  Feeling lightheaded or close to fainting. DIAGNOSIS  A diagnosis of HF is based on your history, symptoms, physical examination, and diagnostic tests. Diagnostic tests for HF may include:  EKG.  Chest X-ray.  Blood tests.  Exercise stress test.  Blood oxygen test (arterial blood gas).  Evaluation by a heart doctor (cardiologist).  Ultrasound  evaluation of the heart (echocardiogram).  Heart artery test to look for blockages (angiogram).  Radioactive imaging to look at the heart (radionuclide test). TREATMENT  Treatment is aimed at managing the symptoms of HF. Medicines, lifestyle changes, or surgical intervention may be necessary to treat HF.  Medicines to help treat HF may include:  Angiotensin-converting enzyme (ACE) inhibitors. These block the effects of a blood protein called angiotensin-converting enzyme. ACE inhibitors relax (dilate) the blood vessels and help lower blood pressure. This decreases the workload of the heart, slows the progression of HF, and improves symptoms.  Angiotensin receptor blockers (ARBs). These medications work similar to ACE inhibitors. ARBs may be an alternative for people who cannot tolerate an ACE inhibitor.  Aldosterone antagonists. This medication helps get rid of extra fluid from your body. This lowers the volume of blood the heart has to pump.  Water pills (diuretics). Diuretics cause the kidneys to remove salt and water from the blood. The extra fluid is removed by urination. By removing extra fluid from the body, diuretics help lower the workload of the heart and help prevent fluid buildup in the lungs so breathing is easier.  Beta blockers. These prevent the heart from beating too fast and improve heart muscle strength. Beta blockers help maintain a normal heart rate, control blood pressure, and improve HF symptoms.  Digitalis. This increases the force of the heartbeat and may be helpful to people with HF or heart rhythm problems.  Healthy lifestyle changes include:  Stopping smoking.  Eating a healthy diet. Avoid foods high in fat. Avoid foods fried in oil or made with fat. A dietician can help with healthy food choices.  Limiting how much salt you eat.  Limiting alcohol intake to no more than 1 drink per day for women and 2 drinks per day for men. Drinking more than that is harmful  to your heart. If your heart has already been damaged by alcohol or you have severe HF, drinking alcohol should be stopped completely.  Exercising as directed by your caregiver.  Surgical treatment for HF may include:  Procedures to open blocked arteries, repair damaged heart valves, or remove damaged heart muscle tissue.  A pacemaker to help heart muscle function and to control certain abnormal heart rhythms.  A defibrillator to possibly prevent sudden cardiac death. HOME CARE INSTRUCTIONS   Activity level. Your caregiver can help you determine what type of exercise program may be helpful. It is important to maintain your strength. Pace your physical activity to avoid shortness of breath or chest pain. Rest for 1 hour before and after meals. A cardiac rehabilitation program may be helpful to some people with HF.  Diet. Eat a heart healthy diet. Food choices should be low in saturated fat and cholesterol. Talk to a dietician to learn about heart healthy foods.  Salt intake. When you have HF, you need to limit the amount of salt you eat. Eat less than 1500 milligrams (mg) of salt per day or as recommended by your caregiver.  Weight monitoring. Weigh yourself  every day. You should weigh yourself in the morning after you urinate and before you eat breakfast. Wear the same amount of clothing each time you weigh yourself. Record your weight daily. Bring your recorded weights to your clinic visits. Tell your caregiver right away if you have gained 3 lb/1.4 kg in 1 day, or 5 lb/2.3 kg in a week or whatever amount you were told to report.  Blood pressure monitoring. This should be done as directed by your caregiver. A home blood pressure cuff can be purchased at a drugstore. Record your blood pressure numbers and bring them to your clinic visits. Tell your caregiver if you become dizzy or lightheaded upon standing up.  Smoking. If you are currently a smoker, it is time to quit. Nicotine makes your  heart work harder by causing your blood vessels to constrict. Do not use nicotine gum or patches before talking to your caregiver.  Follow up. Be sure to schedule a follow-up visit with your caregiver. Keep all your appointments. SEEK MEDICAL CARE IF:   Your weight increases by 3 lb/1.4 kg in 1 day or 5 lb/2.3 kg in a week.  You notice increasing shortness of breath that is unusual for you. This may happen during rest, sleep, or with activity.  You cough more than normal, especially with physical activity.  You notice more swelling in your hands, feet, ankles, or belly (abdomen).  You are unable to sleep because it is hard to breathe.  You cough up bloody mucus (sputum).  You begin to feel "jumping" or "fluttering" sensations (palpitations) in your chest. SEEK IMMEDIATE MEDICAL CARE IF:   You have severe chest pain or pressure which may include symptoms such as:  Pain or pressure in the arms, neck, jaw, or back.  Feeling sweaty.  Feeling sick to your stomach (nauseous).  Feeling short of breath while at rest.  Having a fast or irregular heartbeat.  You experience stroke symptoms. These symptoms include:  Facial weakness or numbness.  Weakness or numbness in an arm, leg, or on one side of your body.  Blurred vision.  Difficulty talking or thinking.  Dizziness or fainting.  Severe headache. MAKE SURE YOU:   Understand these instructions.  Will watch your condition.  Will get help right away if you are not doing well or get worse. Document Released: 12/09/2005 Document Revised: 06/09/2012 Document Reviewed: 03/23/2010 [facility] Patient Information 2013 [place], [place].

## 2013-01-05 NOTE — Assessment & Plan Note (Signed)
Patient smoked one pack a day for more than 20 years. He quit his smoking on 12/22/13. He does not have any withdrawal symptoms. He does not need nicotine patch per patient. Patient was encouraged to not smoke again.

## 2013-01-05 NOTE — Assessment & Plan Note (Signed)
Patient has some mild nosebleeding for several days. It is aggravated by coughing. Nose examination showed no obvious abnormalities. It is likely due to the dry season. We'll followup.

## 2013-01-05 NOTE — Assessment & Plan Note (Signed)
Patient had acute congestive heart failure recently. 2-D echo showed EF 40-45% with diffuse hypokinesis and bilateral atrial enlargement. He also had elevated pulmonary artery pressure at 39 mmHG. He had  negative EKG for ischemic change in the hospital. Patient's acute symptoms resolved after diuresis. Patient was discharged on aspirin, beta blocker and Lasix. Today he feels much better. He has mild cough, no palpitation or chest pain. His body weight is decreased by 7 kg since 12/23/12. The etiology for patient's acute congestive heart failure is not clear currently, but it could be related to obstructive sleep apnea. Patient has symptoms for OSA for long time before his recent admission.  -will add ACE inhibitor, lisinopril 5 mg daily. -Check BMP -Give referral to cardiology -will get sleep study -Give referral to social worker for orange card application

## 2013-01-05 NOTE — Assessment & Plan Note (Signed)
-  will give flu shot today.  

## 2013-01-05 NOTE — Assessment & Plan Note (Signed)
Patient has symptoms for obstructive sleep apnea which may have caused his acute congestive heart failure. Will get a sleep study.

## 2013-01-05 NOTE — Progress Notes (Signed)
Patient ID: Colin Walton, male   DOB: [birth year], 42 y.o.   MRN: [id number] Subjective:   Patient ID: Colin Walton male   DOB: 04-20-1971 42 y.o.   MRN: [id number]  CC:   Hospital followup visit following recent acute CHF HPI:  Colin Walton is a 42 y.o. manwith past medical history as outlined below, who presents for a hospital followup visit today.  1) CHF: Patient was recently hospitalized from 12/31-12/27/11 due to acute congestive heart failure. Patient had a 2-D echo on 12/23/12, demonstrating EF = 40-45%, with bilateral atrial enlargement (R>L), and elevated pulmo artery pressure (39mm Hg). Patient may have OSA given his body habitus and elevated PA pressures. Patient had a negative troponin and a LDL 73 in the hospital. Patient was treated with diuretics and discharged on lasix, beta blocker and aspirin. Today patient's feels much better. He reports having mild cough which is also improving. Occasionally he said he has a cough spell which caused nosebleeding. Patient does not have chest pain, shortness of breath or palpitation today. His body weight decreased by 7 kg 12/23/12.   2) OSA: Patient reports that he snores a lot before his recent admission. He wakes up at least 4-5 times during the night. He feels tired during the daytime and sometimes he needs to have shortness sleep and a day times.  Denies fever, chills, headaches, abdominal pain,diarrhea, constipation, dysuria, urgency, frequency, hematuria, joint pain or leg swelling.  Past Medical History  Diagnosis Date  . Hypertension   . Sleep apnea 12/24/12   Current Outpatient Prescriptions  Medication Sig Dispense Refill  . aspirin EC 81 MG EC tablet Take 1 tablet (81 mg total) by mouth daily.  30 tablet  5  . carvedilol (COREG) 3.125 MG tablet Take 1 tablet (3.125 mg total) by mouth 2 (two) times daily with a meal.  60 tablet  1  . furosemide (LASIX) 40 MG tablet Take 1 tablet (40 mg total) by mouth daily.  30 tablet  1    No family history on file. History   Social History  . Marital Status: Single    Spouse Name: N/A    Number of Children: N/A  . Years of Education: N/A   Social History Main Topics  . Smoking status: Former Smoker    Quit date: 12/23/2012  . Smokeless tobacco: None  . Alcohol Use: Yes  . Drug Use: Yes    Special: Marijuana  . Sexually Active: None   Other Topics Concern  . None   Social History Narrative  . None    Review of Systems: General: no fevers, chills. Has nose bleeding. Skin: no rash HEENT: no blurry vision, hearing changes or sore throat Pulm: no dyspnea, has mild coughing, No wheezing CV: no chest pain, palpitations Abd: no nausea/vomiting, abdominal pain, diarrhea/constipation GU: no dysuria, hematuria, polyuria Ext: no arthralgias, myalgias Neuro: no weakness, numbness, or tingling  Objective:  Physical Exam: Filed Vitals:   01/05/13 0837  BP: 132/81  Pulse: 100  Temp: 98.3 F (36.8 C)  TempSrc: Oral  Height: 5\' 6"  (1.676 m)  Weight: 230 lb 6.4 oz (104.509 kg)  SpO2: 96%   General: Not in acute distress HEENT: PERRL, EOMI, no scleral icterus, No bruit or JVD Cardiac: S1/S2, RRR, has 1/6 systolic murmurs, No gallops or rubs Pulm: Good air movement bilaterally, Clear to auscultation bilaterally, No rales, wheezing, rhonchi or rubs. Abd: Soft,  nondistended, nontender, no rebound pain, no organomegaly, BS present Ext:  No rashes. Has 1 to 2 + pitting edema in legs, 2+DP/PT pulse bilaterally Musculoskeletal: No joint deformities, erythema, or stiffness, ROM full and nontender Skin: no rashes. No skin bruise. Neuro: Alert and oriented X3, cranial nerves II-XII grossly intact, muscle strength 5/5 in all extremeties,  sensation to light touch intact. Brachial reflexes 1+ bilaterally. Knee reflexes 2+ bilaterally. Babinski's sign negative. Psych: patient is not psychotic, no suicidal or hemocidal ideation.    Assessment & Plan:

## 2013-01-08 ENCOUNTER — Ambulatory Visit: Payer: Self-pay

## 2013-01-18 ENCOUNTER — Encounter: Payer: Self-pay | Admitting: Internal Medicine

## 2013-01-18 ENCOUNTER — Ambulatory Visit (INDEPENDENT_AMBULATORY_CARE_PROVIDER_SITE_OTHER): Payer: Self-pay | Admitting: Internal Medicine

## 2013-01-18 VITALS — BP 149/89 | HR 98 | Temp 97.4°F | Ht 66.0 in | Wt 220.1 lb

## 2013-01-18 DIAGNOSIS — I509 Heart failure, unspecified: Secondary | ICD-10-CM

## 2013-01-18 DIAGNOSIS — G4733 Obstructive sleep apnea (adult) (pediatric): Secondary | ICD-10-CM

## 2013-01-18 LAB — BASIC METABOLIC PANEL WITH GFR
BUN: 16 mg/dL (ref 6–23)
Calcium: 9.6 mg/dL (ref 8.4–10.5)
Chloride: 102 mEq/L (ref 96–112)
GFR, Est African American: >89 mL/min
GFR, Est Non African American: 79 mL/min
Glucose, Bld: 91 mg/dL (ref 70–99)
Sodium: 140 mEq/L (ref 135–145)

## 2013-01-18 MED ORDER — CARVEDILOL 3.125 MG PO TABS: 6.2500 mg | ORAL_TABLET | Freq: Two times a day (BID) | ORAL | Status: DC

## 2013-01-18 NOTE — Patient Instructions (Addendum)
1. You have done great job in taking all your medications. I appreciate it very much. Please continue doing that. 2. Please increase your Coreg dose to 6.25 mg twice day. 3. If you have worsening of your symptoms or new symptoms arise, please call the clinic ([phone]), or go to the ER immediately if symptoms are severe.    Heart Failure Heart failure (HF) is a condition in which the heart has trouble pumping blood. This means your heart does not pump blood efficiently for your body to work well. In some cases of HF, fluid may back up into your lungs or you may have swelling (edema) in your lower legs. HF is a long-term (chronic) condition. It is important for you to take good care of yourself and follow your caregiver's treatment plan. CAUSES   Health conditions:  High blood pressure (hypertension) causes the heart muscle to work harder than normal. When pressure in the blood vessels is high, the heart needs to pump (contract) with more force in order to circulate blood throughout the body. High blood pressure eventually causes the heart to become stiff and weak.  Coronary artery disease (CAD) is the buildup of cholesterol and fat (plaques) in the arteries of the heart. The blockage in the arteries deprives the heart muscle of oxygen and blood. This can cause chest pain and may lead to a heart attack. High blood pressure can also contribute to CAD.  Heart attack (myocardial infarction) occurs when 1 or more arteries in the heart become blocked. The loss of oxygen damages the muscle tissue of the heart. When this happens, part of the heart muscle dies. The injured tissue does not contract as well and weakens the heart's ability to pump blood.  Abnormal heart valves can cause HF when the heart valves do not open and close properly. This makes the heart muscle pump harder to keep the blood flowing.  Heart muscle disease (cardiomyopathy or myocarditis) is damage to the heart muscle from a variety of  causes. These can include drug or alcohol abuse, infections, or unknown reasons. These can increase the risk of HF.  Lung disease makes the heart work harder because the lungs do not work properly. This can cause a strain on the heart leading it to fail.  Diabetes increases the risk of HF. High blood sugar contributes to high fat (lipid) levels in the blood. Diabetes can also cause slow damage to tiny blood vessels that carry important nutrients to the heart muscle. When the heart does not get enough oxygen and food, it can cause the heart to become weak and stiff. This leads to a heart that does not contract efficiently.  Other diseases can contribute to HF. These include abnormal heart rhythms, thyroid problems, and low blood counts (anemia).  Unhealthy lifestyle habits:  Obesity.  Smoking.  Eating foods high in fat and cholesterol.  Eating or drinking beverages high in salt.  Drug or alcohol abuse.  Lack of exercise. SYMPTOMS  HF symptoms may vary and can be hard to detect. Symptoms may include:  Shortness of breath with activity, such as climbing stairs.  Persistent cough.  Swelling of the feet, ankles, legs, or abdomen.  Unexplained weight gain.  Difficulty breathing when lying flat.  Waking from sleep because of the need to sit up and get more air.  Rapid heartbeat.  Fatigue and loss of energy.  Feeling lightheaded or close to fainting. DIAGNOSIS  A diagnosis of HF is based on your history, symptoms, physical  examination, and diagnostic tests. Diagnostic tests for HF may include:  EKG.  Chest X-ray.  Blood tests.  Exercise stress test.  Blood oxygen test (arterial blood gas).  Evaluation by a heart doctor (cardiologist).  Ultrasound evaluation of the heart (echocardiogram).  Heart artery test to look for blockages (angiogram).  Radioactive imaging to look at the heart (radionuclide test). TREATMENT  Treatment is aimed at managing the symptoms of  HF. Medicines, lifestyle changes, or surgical intervention may be necessary to treat HF.  Medicines to help treat HF may include:  Angiotensin-converting enzyme (ACE) inhibitors. These block the effects of a blood protein called angiotensin-converting enzyme. ACE inhibitors relax (dilate) the blood vessels and help lower blood pressure. This decreases the workload of the heart, slows the progression of HF, and improves symptoms.  Angiotensin receptor blockers (ARBs). These medications work similar to ACE inhibitors. ARBs may be an alternative for people who cannot tolerate an ACE inhibitor.  Aldosterone antagonists. This medication helps get rid of extra fluid from your body. This lowers the volume of blood the heart has to pump.  Water pills (diuretics). Diuretics cause the kidneys to remove salt and water from the blood. The extra fluid is removed by urination. By removing extra fluid from the body, diuretics help lower the workload of the heart and help prevent fluid buildup in the lungs so breathing is easier.  Beta blockers. These prevent the heart from beating too fast and improve heart muscle strength. Beta blockers help maintain a normal heart rate, control blood pressure, and improve HF symptoms.  Digitalis. This increases the force of the heartbeat and may be helpful to people with HF or heart rhythm problems.  Healthy lifestyle changes include:  Stopping smoking.  Eating a healthy diet. Avoid foods high in fat. Avoid foods fried in oil or made with fat. A dietician can help with healthy food choices.  Limiting how much salt you eat.  Limiting alcohol intake to no more than 1 drink per day for women and 2 drinks per day for men. Drinking more than that is harmful to your heart. If your heart has already been damaged by alcohol or you have severe HF, drinking alcohol should be stopped completely.  Exercising as directed by your caregiver.  Surgical treatment for HF may  include:  Procedures to open blocked arteries, repair damaged heart valves, or remove damaged heart muscle tissue.  A pacemaker to help heart muscle function and to control certain abnormal heart rhythms.  A defibrillator to possibly prevent sudden cardiac death. HOME CARE INSTRUCTIONS   Activity level. Your caregiver can help you determine what type of exercise program may be helpful. It is important to maintain your strength. Pace your physical activity to avoid shortness of breath or chest pain. Rest for 1 hour before and after meals. A cardiac rehabilitation program may be helpful to some people with HF.  Diet. Eat a heart healthy diet. Food choices should be low in saturated fat and cholesterol. Talk to a dietician to learn about heart healthy foods.  Salt intake. When you have HF, you need to limit the amount of salt you eat. Eat less than 1500 milligrams (mg) of salt per day or as recommended by your caregiver.  Weight monitoring. Weigh yourself every day. You should weigh yourself in the morning after you urinate and before you eat breakfast. Wear the same amount of clothing each time you weigh yourself. Record your weight daily. Bring your recorded weights to your clinic  visits. Tell your caregiver right away if you have gained 3 lb/1.4 kg in 1 day, or 5 lb/2.3 kg in a week or whatever amount you were told to report.  Blood pressure monitoring. This should be done as directed by your caregiver. A home blood pressure cuff can be purchased at a drugstore. Record your blood pressure numbers and bring them to your clinic visits. Tell your caregiver if you become dizzy or lightheaded upon standing up.  Smoking. If you are currently a smoker, it is time to quit. Nicotine makes your heart work harder by causing your blood vessels to constrict. Do not use nicotine gum or patches before talking to your caregiver.  Follow up. Be sure to schedule a follow-up visit with your caregiver. Keep all  your appointments. SEEK MEDICAL CARE IF:   Your weight increases by 3 lb/1.4 kg in 1 day or 5 lb/2.3 kg in a week.  You notice increasing shortness of breath that is unusual for you. This may happen during rest, sleep, or with activity.  You cough more than normal, especially with physical activity.  You notice more swelling in your hands, feet, ankles, or belly (abdomen).  You are unable to sleep because it is hard to breathe.  You cough up bloody mucus (sputum).  You begin to feel "jumping" or "fluttering" sensations (palpitations) in your chest. SEEK IMMEDIATE MEDICAL CARE IF:   You have severe chest pain or pressure which may include symptoms such as:  Pain or pressure in the arms, neck, jaw, or back.  Feeling sweaty.  Feeling sick to your stomach (nauseous).  Feeling short of breath while at rest.  Having a fast or irregular heartbeat.  You experience stroke symptoms. These symptoms include:  Facial weakness or numbness.  Weakness or numbness in an arm, leg, or on one side of your body.  Blurred vision.  Difficulty talking or thinking.  Dizziness or fainting.  Severe headache. MAKE SURE YOU:   Understand these instructions.  Will watch your condition.  Will get help right away if you are not doing well or get worse. Document Released: 12/09/2005 Document Revised: 06/09/2012 Document Reviewed: 03/23/2010 [facility] Patient Information 2013 [place], [place].

## 2013-01-18 NOTE — Assessment & Plan Note (Signed)
Pending sleep study, if positive, will consider CPAP.

## 2013-01-18 NOTE — Assessment & Plan Note (Addendum)
Patient had acute congestive heart failure recently. Etiology was not completely clear.  2-D echo showed EF 40-45% with diffuse hypokinesis and bilateral atrial enlargement. He also had elevated pulmonary artery pressure at 39 mmHG. He had negative EKG for ischemic change in the hospital. Currently patient is on aspirin, lisinopril, Coreg and Lasix. Today he is asymptomatic. No leg edema. His body weight is decreased by more than 4 Kg. patient's heart rate 98 is not targeting goal for beta blocker. Lisinopril targeting dose should be 20-40 mg daily.   -Check BMP  -will increase Coreg dose to 6.25 mg daily. Will consider to titrate up lisinopril dose in next visit. -Give referral to cardiology  -Pending sleep study  -patient was given a referral to social worker for orange card application, but patient choose to purchase his own private insurance.

## 2013-01-18 NOTE — Progress Notes (Signed)
Patient ID: Colin Walton, male   DOB: 1970/12/26, 42 y.o.   MRN: [id number] Patient ID: Colin Walton, male   DOB: 27-Nov-1971, 42 y.o.   MRN: [id number] Subjective:   Patient ID: Colin Walton male   DOB: 04/18/1971 41 y.o.   MRN: [id number]  CC:  Follow up visit for CHF HPI:  Colin Walton is a 42 y.o. manwith past medical history as outlined below, who presents for a  followup visit today.  1) CHF: patient was seen by me in clinic on 01/05/13 for recent acute CHF hospital follow up visit. He was recently hospitalized from 12/31-12/27/11 due to acute congestive heart failure. Patient had a 2-D echo on 12/23/12, demonstrating EF = 40-45%, with bilateral atrial enlargement (R>L), and elevated pulmo artery pressure (39mm Hg). Patient may have OSA given his body habitus and elevated PA pressures. Patient had a negative troponin and a LDL 73 in the hospital. The patient is currently taking Lasix, aspirin, Coreg and lisinopril. Today patient's feels good except for left eye redness. He reports that he used to have right eye redness which resolved spontaneously. Yesterday, he noticed left eye redness without any trigerring reason. There is no vision change or discharge. He reports having mild cough which is also improving. Occasionally he said he has a cough spell which caused left lower chest wall muscle pain. Patient does not have chest pain, shortness of breath or palpitation today. His body weight decreased by more than 4 kg.   2) OSA: Patient reports that he snores a lot before his recent admission. He wakes up at least 4-5 times during the night. He feels tired during the daytime and sometimes he needs to have shortness sleep and a day times. He was given a referral for sleep study in last visit. He has an appointment on 01/29/29 for sleep study.   Denies fever, chills, headaches, abdominal pain,diarrhea, constipation, dysuria, urgency, frequency, hematuria, joint pain or leg swelling.  Past  Medical History  Diagnosis Date  . Hypertension   . Sleep apnea 12/24/12   Current Outpatient Prescriptions  Medication Sig Dispense Refill  . aspirin EC 81 MG EC tablet Take 1 tablet (81 mg total) by mouth daily.  30 tablet  5  . carvedilol (COREG) 3.125 MG tablet Take 1 tablet (3.125 mg total) by mouth 2 (two) times daily with a meal.  60 tablet  1  . furosemide (LASIX) 40 MG tablet Take 1 tablet (40 mg total) by mouth daily.  30 tablet  1   No family history on file. History   Social History  . Marital Status: Single    Spouse Name: N/A    Number of Children: N/A  . Years of Education: N/A   Social History Main Topics  . Smoking status: Former Smoker    Quit date: 12/23/2012  . Smokeless tobacco: None  . Alcohol Use: Yes  . Drug Use: Yes    Special: Marijuana  . Sexually Active: None   Other Topics Concern  . None   Social History Narrative  . None    Review of Systems: General: no fevers, chills. Has red eye on the left side Skin: no rash HEENT: no blurry vision, hearing changes or sore throat Pulm: no dyspnea, has mild coughing, No wheezing CV: no chest pain, palpitations Abd: no nausea/vomiting, abdominal pain, diarrhea/constipation GU: no dysuria, hematuria, polyuria Ext: no arthralgias, myalgias Neuro: no weakness, numbness, or tingling  Objective:  Bp 149/89, and Repeat  BP 128/76 mmHg, HR=98, O2 Sat 96%.  General: Not in acute distress HEENT: PERRL, EOMI, no scleral icterus, No bruit or JVD Cardiac: S1/S2, RRR, has 1/6 systolic murmurs, No gallops or rubs Pulm: Good air movement bilaterally, Clear to auscultation bilaterally, No rales, wheezing, rhonchi or rubs. Abd: Soft,  nondistended, nontender, no rebound pain, no organomegaly, BS present Ext: No rashes. Has no edema in legs, 2+DP/PT pulse bilaterally. There is no tenderness or swelling over calf areas bilaterally. Musculoskeletal: No joint deformities, erythema, or stiffness, ROM full and  nontender Skin: no rashes. No skin bruise. Neuro: Alert and oriented X3, cranial nerves II-XII grossly intact, muscle strength 5/5 in all extremeties,  sensation to light touch intact. Brachial reflexes 1+ bilaterally. Knee reflexes 2+ bilaterally. Babinski's sign negative. Psych: patient is not psychotic, no suicidal or hemocidal ideation.    Assessment & Plan:

## 2013-01-21 ENCOUNTER — Telehealth: Payer: Self-pay | Admitting: Internal Medicine

## 2013-01-21 NOTE — Telephone Encounter (Signed)
Per patient's request, I called patient back. Patient reports that he has red eyes. Actually, patient's right eye was red before, which is better now. But his left eye turns red recently. He does not have eye pain or loss of vision. He said that his mother read from some where that his lisinopril and lasix could be responsible for this problem. I discussed with patient that these two medications are important medications for his CHF. Although it is possible that lisinopril or lasix can cause red eye side effect, if it is not getting worse, we probably can continue current regimen and observe him closely from now. If red eye gets worse, will consider to switch medications. He agreed with this plan.   Lorretta Harp, MD PGY2, Internal Medicine Teaching Service Pager: [phone]

## 2013-01-29 ENCOUNTER — Encounter (HOSPITAL_BASED_OUTPATIENT_CLINIC_OR_DEPARTMENT_OTHER): Payer: Self-pay

## 2013-02-12 ENCOUNTER — Ambulatory Visit (HOSPITAL_BASED_OUTPATIENT_CLINIC_OR_DEPARTMENT_OTHER): Payer: 59 | Attending: Internal Medicine

## 2013-02-12 VITALS — Ht 68.0 in | Wt 213.0 lb

## 2013-02-12 DIAGNOSIS — R0982 Postnasal drip: Secondary | ICD-10-CM | POA: Insufficient documentation

## 2013-02-12 DIAGNOSIS — R059 Cough, unspecified: Secondary | ICD-10-CM | POA: Insufficient documentation

## 2013-02-12 DIAGNOSIS — R0989 Other specified symptoms and signs involving the circulatory and respiratory systems: Secondary | ICD-10-CM | POA: Insufficient documentation

## 2013-02-12 DIAGNOSIS — Z7982 Long term (current) use of aspirin: Secondary | ICD-10-CM | POA: Insufficient documentation

## 2013-02-12 DIAGNOSIS — R05 Cough: Secondary | ICD-10-CM | POA: Insufficient documentation

## 2013-02-12 DIAGNOSIS — R0609 Other forms of dyspnea: Secondary | ICD-10-CM | POA: Insufficient documentation

## 2013-02-12 DIAGNOSIS — G4733 Obstructive sleep apnea (adult) (pediatric): Secondary | ICD-10-CM

## 2013-02-12 DIAGNOSIS — Z79899 Other long term (current) drug therapy: Secondary | ICD-10-CM | POA: Insufficient documentation

## 2013-02-14 DIAGNOSIS — R0989 Other specified symptoms and signs involving the circulatory and respiratory systems: Secondary | ICD-10-CM

## 2013-02-14 DIAGNOSIS — R0982 Postnasal drip: Secondary | ICD-10-CM

## 2013-02-14 DIAGNOSIS — Z79899 Other long term (current) drug therapy: Secondary | ICD-10-CM

## 2013-02-14 DIAGNOSIS — R0609 Other forms of dyspnea: Secondary | ICD-10-CM

## 2013-02-14 DIAGNOSIS — Z7982 Long term (current) use of aspirin: Secondary | ICD-10-CM

## 2013-02-14 DIAGNOSIS — R05 Cough: Secondary | ICD-10-CM

## 2013-02-19 ENCOUNTER — Encounter (HOSPITAL_BASED_OUTPATIENT_CLINIC_OR_DEPARTMENT_OTHER): Payer: Self-pay

## 2013-02-22 ENCOUNTER — Ambulatory Visit: Payer: 59 | Admitting: Cardiovascular Disease

## 2013-02-24 NOTE — Procedures (Signed)
NAMEBRADON, Colin Walton             ACCOUNT NO.:  [id number]  MEDICAL RECORD NO.:  [id number]          PATIENT TYPE:  OUT  LOCATION:  SLEEP CENTER                 FACILITY[facility]  [facility]  PHYSICIAN:  Clinton D. Maple Hudson, MD, FCCP, FACPDATE OF BIRTH:  08-30-72  DATE OF STUDY:  02/12/2013                           NOCTURNAL POLYSOMNOGRAM  REFERRING PHYSICIAN:  C. Ulyess Mort, M.D.  INDICATION FOR STUDY:  Hypersomnia with sleep apnea.  EPWORTH SLEEPINESS SCORE:  5/24.  BMI 32, weight 213 pounds, height 68 inches, neck 18 inches.  MEDICATIONS:  Home medications are charted and reviewed.  SLEEP ARCHITECTURE:  Split-study protocol.  During the diagnostic phase, total sleep time 121.5 minutes with sleep efficiency 84.4%.  Stage I was 4.1%, stage II 71.2%, stage III absent. REM 24.7% of total sleep time. Sleep latency 73.5 minutes, awake after sleep onset 2.5 minutes. Arousal index 1.5.  BEDTIME MEDICATION:  None.  RESPIRATORY DATA:  Apnea-hypopnea index (AHI) 115.6 per hour.  A total of 234 events was scored including 109 obstructive apneas, 31 central apneas, 7 mixed apneas, 87 hypopneas.  The events were associated with non-supine sleep position.  REM/AHI 78 per hour.  CPAP was titrated to 13 CWP with inadequate control and a residual AHI of 83.1 per hour. Bilevel (BiPAP) was then titrated to an inspiratory pressure of 14 and an expiratory pressure of 12 CWP.  At the end of the study, this left a few residual hypopneas with an AHI of 27.7 per hour.  He wore a medium ResMed Quattro FX full-face mask with heated humidifier.  Snoring before CPAP with oxygen desaturation to a nadir of 84% on room air.  With final BiPAP titration, snoring was prevented and mean oxygen saturation held 96.2% on room air.  OXYGEN DATA:  CARDIAC DATA:  Normal sinus rhythm.  MOVEMENT-PARASOMNIA:  No significant movement disturbance.  No bathroom trips.  IMPRESSIONS-RECOMMENDATIONS: 1. Severe obstructive  sleep apnea/hypopnea syndrome, AHI 115.6 per     hour with non-supine events.  Loud snoring with oxygen desaturation     to a nadir of 84% on room air. 2. CPAP titration was not providing adequate control at 13 CWP, and     was changed at that point to bilevel (BiPAP).  Final BiLevel titration to     inspiratory pressure of 14 and expiratory pressure 12 CWP.  There     were a few residual events.  He     wore a medium ResMed Quattro FX full-face mask with heated     humidifier.  Recommend that he start at home at inspiratory 14 and     expiratory 12 with Bilevel Pap.  Outpatient adjustment may be     required for optimum pressure.  Consider if ENT evaluation is     needed for potential obstructing anatomy in the nasopharynx.     Clinton D. Maple Hudson, MD, [facility], FACP Diplomate, American Board of Sleep Medicine    CDY/MEDQ  D:  02/14/2013 13:59:31  T:  02/15/2013 01:10:56  Job:  [id number]

## 2013-02-25 ENCOUNTER — Telehealth: Payer: Self-pay | Admitting: *Deleted

## 2013-02-25 NOTE — Telephone Encounter (Signed)
Agree with plan 

## 2013-02-25 NOTE — Telephone Encounter (Signed)
Pt called stating he noted a 6 pound weight gain over 4 days.  Denies SOB, ankle swelling.  He feels pretty good, but was told to call for weight gain. Pt has appointment scheduled in clinic on 3/11. Instructed to call back if he gains more weight, develops SOB or swelling. Also to be sure and keep his appointment next week.  Please advise for more instructions. Pt # [id number]  Hx: CHF

## 2013-03-02 ENCOUNTER — Encounter: Payer: Self-pay | Admitting: Internal Medicine

## 2013-03-02 ENCOUNTER — Ambulatory Visit (INDEPENDENT_AMBULATORY_CARE_PROVIDER_SITE_OTHER): Payer: 59 | Admitting: Internal Medicine

## 2013-03-02 VITALS — BP 149/88 | HR 100 | Temp 98.2°F | Ht 66.0 in | Wt 229.8 lb

## 2013-03-02 DIAGNOSIS — I509 Heart failure, unspecified: Secondary | ICD-10-CM

## 2013-03-02 DIAGNOSIS — G4733 Obstructive sleep apnea (adult) (pediatric): Secondary | ICD-10-CM

## 2013-03-02 NOTE — Patient Instructions (Addendum)
1. Please increase your lasix dose to 60 mg daily from now. 2. Please increase your Coreg dose to 12.5 mg twice a day from now. please come back in 2 weeks. 3. If you have worsening of your symptoms or new symptoms arise, please call the clinic ([phone]), or go to the ER immediately if symptoms are severe.  You have done great job in taking all your medications. I appreciate it very much. Please continue doing that.

## 2013-03-02 NOTE — Progress Notes (Signed)
Patient ID: Colin Walton, male   DOB: 1971-04-02, 42 y.o.   MRN: [id number] Subjective:   Patient ID: Colin Walton male   DOB: January 10, 1971 42 y.o.   MRN: [id number]  CC:  Follow up visit for CHF and OSA         HPI:  Colin Walton is a 42 y.o. man with past medical history as outlined below, who presents for a followup visit today.  1) CHF:  Patient was newly diagnosed as congestive heart failure in recent hospitalization from 12/22/12 to 12/26/12. He was seen by me on 01/18/13 for follow up visit. His Coreg dose was incresed from 3.15 to 6.25 mg bid. Patient had a 2-D echo on 12/23/12, demonstrating EF = 40-45%, with bilateral atrial enlargement (R>L), and elevated pulmo artery pressure (39mm Hg). He is currently taking Lasix 40 mg daily, aspirin, Coreg 6.25 mg bid and lisinopril 5 mg daily.  Today patient's feels good except for gaining 9 LBs since last seen on 01/18/13. He has occasional SOB. He sleeps with 2 pillows at night. He does not have PND or orthopnea. 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 He can walk more than 2 blocks without shortness breath. Patient reports that she has been having occasional dry cough before he was hospitalized and before he started taking lisinopril. Currently his cough is mild and occasional. He has an appointment with cardiologist on 03/26/13.   2) OSA: Patient reports that he snores a lot before his recent admission. He wakes up at least 4-5 times during the night. He feels tired during the daytime and sometimes he needs to have shortness sleep and a day times. He was given a referral for sleep study in last visit. The results showed severe obstructive sleep apnea/hypopnea syndrome with AHI 115.6 per Hour and loud snoring with oxygen desaturation  to a nadir of 84% on room air.   ROS: Denies fever, chills, headaches, abdominal pain,diarrhea, constipation, dysuria, urgency, frequency, hematuria, joint pain or leg swelling.  Past  Medical History  Diagnosis Date  . Hypertension   . Sleep apnea 12/24/12   Current Outpatient Prescriptions  Medication Sig Dispense Refill  . aspirin EC 81 MG EC tablet Take 1 tablet (81 mg total) by mouth daily.  30 tablet  5  . carvedilol (COREG) 3.125 MG tablet Take 12.5 mg by mouth 2 (two) times daily with a meal.      . furosemide (LASIX) 40 MG tablet Take 60 mg by mouth daily.      Marland Kitchen lisinopril (PRINIVIL,ZESTRIL) 5 MG tablet Take 1 tablet (5 mg total) by mouth daily.  30 tablet  3   No current facility-administered medications for this visit.   No family history on file. History   Social History  . Marital Status: Single    Spouse Name: N/A    Number of Children: N/A  . Years of Education: N/A   Social History Main Topics  . Smoking status: Former Smoker    Quit date: 12/22/2012  . Smokeless tobacco: None  . Alcohol Use: Yes  . Drug Use: Yes    Special: Marijuana  . Sexually Active: None   Other Topics Concern  . None   Social History Narrative  . None    Review of Systems: as per HPI  Objective:  Physical Exam: Filed Vitals:   03/02/13 1609  BP: 149/88  Pulse: 100  Temp: 98.2 F (36.8 C)  TempSrc: Oral  Height: 5'  6" (1.676 m)  Weight: 229 lb 12.8 oz (104.237 kg)  SpO2: 97%   General: Not in acute distress HEENT: PERRL, EOMI, no scleral icterus, No bruit. It is difficult to assess JVD due to body habitus. Cardiac: S1/S2, RRR, has 1/6 systolic murmurs, No gallops or rubs Pulm: Good air movement bilaterally, Clear to auscultation bilaterally, No rales, wheezing, rhonchi or rubs. Abd: Soft,  nondistended, nontender, no rebound pain, no organomegaly, BS present Ext: No rashes. Trace edema in legs, 2+DP/PT pulse bilaterally. There is no tenderness or swelling over calf areas bilaterally. Musculoskeletal: No joint deformities, erythema, or stiffness, ROM full and nontender Skin: no rashes. No skin bruise. Neuro: Alert and oriented X3, cranial nerves  II-XII grossly intact, muscle strength 5/5 in all extremeties,  sensation to light touch intact.  Psych: patient is not psychotic, no suicidal or hemocidal ideation.   Assessment & Plan:

## 2013-03-02 NOTE — Assessment & Plan Note (Signed)
He had sleep study. The results showed severe OSA with AHI of 115.6 per hour.  I discussed with him about starting BiPAP. He feels that the machine is very uncomfortable and would like to see ENT doctor first before he can decide whether to start BiPAP.   -will give him referral to ENT and follow up recommendation.

## 2013-03-02 NOTE — Assessment & Plan Note (Signed)
The etiology was not clear for his CHF.  2-D echo showed EF 40-45% with diffuse hypokinesis and bilateral atrial enlargement. He also had elevated pulmonary artery pressure at 39 mmHG. He had negative EKG for ischemic change in the hospital. Currently patient is on aspirin, lisinopril 5 mg daily, Coreg 6.25 mg bid and Lasix 40 mg daily. He has occasional mild SOB. His body weight has increased by 9 Lbs since last seen on 01/18/13, indicating he has some fluid retention. Patient's heart rate of 100/min is not at targeting goal for beta blocker. Lisinopril targeting dose should be 20-40 mg daily. He has an appointment with cardiology on 03/26/13.  - will increase Coreg dose to 12.5 bid.  - Increase his lasix dose to 60 mg daily and follow up in 2 weeks.  - Will consider to titrate up lisinopril dose in next visit.

## 2013-03-03 ENCOUNTER — Other Ambulatory Visit: Payer: Self-pay | Admitting: *Deleted

## 2013-03-03 DIAGNOSIS — I509 Heart failure, unspecified: Secondary | ICD-10-CM

## 2013-03-03 MED ORDER — FUROSEMIDE 40 MG PO TABS: 60.0000 mg | ORAL_TABLET | Freq: Every day | ORAL | Status: DC

## 2013-03-03 MED ORDER — CARVEDILOL 3.125 MG PO TABS: 12.5000 mg | ORAL_TABLET | Freq: Two times a day (BID) | ORAL | Status: DC

## 2013-03-23 ENCOUNTER — Ambulatory Visit (INDEPENDENT_AMBULATORY_CARE_PROVIDER_SITE_OTHER): Payer: 59 | Admitting: Internal Medicine

## 2013-03-23 ENCOUNTER — Encounter: Payer: Self-pay | Admitting: Internal Medicine

## 2013-03-23 DIAGNOSIS — I509 Heart failure, unspecified: Secondary | ICD-10-CM

## 2013-03-23 DIAGNOSIS — G4733 Obstructive sleep apnea (adult) (pediatric): Secondary | ICD-10-CM

## 2013-03-23 MED ORDER — FUROSEMIDE 40 MG PO TABS: 80.0000 mg | ORAL_TABLET | Freq: Every day | ORAL | Status: DC

## 2013-03-23 MED ORDER — LISINOPRIL 5 MG PO TABS: 10.0000 mg | ORAL_TABLET | Freq: Every day | ORAL | Status: DC

## 2013-03-23 NOTE — Progress Notes (Signed)
Patient ID: Colin Walton, male   DOB: 02-17-1971, 42 y.o.   MRN: [id number] Subjective:   Patient ID: Colin Walton male   DOB: [birth year] 41 y.o.   MRN: [id number]  CC:   Follow up visit for CHF  HPI:   Colin Walton is a 41 y.o.  Man with past medical history as outlined below, who presents for a followup visit today.  1. CHF:  Patient was newly diagnosed with congestive heart failure 12/22/12. Patient had a 2-D echo on 12/23/12, demonstrating EF = 40-45%, with bilateral atrial enlargement (R>L), and elevated pulmo artery pressure (39mm Hg).He was seen by me on 03/02/13 for follow up visit. Because of BW increase by 9 LBs, I increased his lasix from 40 mg daily to 60 mg daily. I also increased his Coreg dose to 12.5 mg bid. Currently, he is on Lasix 60 mg daily, coreg 12.g mg daily, lisinopril 5 mg daily, and ASA  BW:  220 Lbs on 01/18/13 213 Lbs on 02/12/13 (likely his dry weight) 220 Lbs on 03/02/13 236 Lbs on 03/23/13 (Today)  He has occasional SOB. He sleeps with 2 pillows at night. He does not have PND or orthopnea. 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 He can walk more than 2 blocks without shortness breath. Patient does not have chest pain, cough, or palpitation. He has an appointment with cardiologist on 03/26/13.   2) OSA: Patient reports that he snores a lot before his recent admission. He wakes up at least 4-5 times during the night. He feels tired during the daytime and sometimes he needs to have shortness sleep and a day times. He was given a referral for sleep study in last visit. The results showed severe obstructive sleep apnea/hypopnea syndrome with AHI 115.6 per Hour and loud snoring with oxygen desaturation to a nadir of 84% on room air. I discussed with him about using BiPAP in last visit. He felt that the machine waa very uncomfortable and would like to see ENT doctor first before he could decide whether to start BiPAP. He was given a referral  to ENT in last visit. paitent visited ENT office on  03/19/13 and was told that he needs to start using BiPAP.  ROS: Denies fever, chills, headaches, abdominal pain,diarrhea, constipation, dysuria, urgency, frequency, hematuria, joint pain or leg swelling.    Past Medical History  Diagnosis Date  . Hypertension   . Sleep apnea 12/24/12   Current Outpatient Prescriptions  Medication Sig Dispense Refill  . aspirin EC 81 MG EC tablet Take 1 tablet (81 mg total) by mouth daily.  30 tablet  5  . carvedilol (COREG) 3.125 MG tablet Take 4 tablets (12.5 mg total) by mouth 2 (two) times daily with a meal.  120 tablet  5  . furosemide (LASIX) 40 MG tablet Take 1.5 tablets (60 mg total) by mouth daily.  30 tablet  5  . lisinopril (PRINIVIL,ZESTRIL) 5 MG tablet Take 1 tablet (5 mg total) by mouth daily.  30 tablet  3   No current facility-administered medications for this visit.   No family history on file. History   Social History  . Marital Status: Single    Spouse Name: N/A    Number of Children: N/A  . Years of Education: N/A   Social History Main Topics  . Smoking status: Former Smoker    Quit date: 12/22/2012  . Smokeless tobacco: Not on file  . Alcohol Use: Yes  .  Drug Use: Yes    Special: Marijuana  . Sexually Active: Not on file   Other Topics Concern  . Not on file   Social History Narrative  . No narrative on file    Review of Systems: as per HPI   Objective:  Physical Exam: There were no vitals filed for this visit.  General: Not in acute distress HEENT: PERRL, EOMI, no scleral icterus, No bruit. It is difficult to assess JVD due to body habitus. Cardiac: S1/S2, RRR, has 1/6 systolic murmurs, No gallops or rubs Pulm: Good air movement bilaterally, Clear to auscultation bilaterally, No rales, wheezing, rhonchi or rubs. Abd: Soft,  nondistended, nontender, no rebound pain, no organomegaly, BS present Ext: No rashes. Trace edema in legs, 2+DP/PT pulse bilaterally.  There is no tenderness or swelling over calf areas bilaterally. Musculoskeletal: No joint deformities, erythema, or stiffness, ROM full and nontender Skin: no rashes. No skin bruise. Neuro: Alert and oriented X3, cranial nerves II-XII grossly intact, muscle strength 5/5 in all extremeties,  sensation to light touch intact.   Psych: patient is not psychotic, no suicidal or hemocidal ideation.   Assessment & Plan:

## 2013-03-23 NOTE — Assessment & Plan Note (Signed)
Patient has severe obstructive sleep apnea on recent sleep study. He was also evaluated by ENT doctor, who suggested to start BiPAP for patient.  -will give prescription for BiPAP today.

## 2013-03-23 NOTE — Assessment & Plan Note (Signed)
Patient is clinically doing okay. He does not have worsening shortness of breath, PND, chest pain, palpitation. His physical examination showed trace amount of leg edema bilaterally. However his body weight continued to increase from 213 on 02/12/13 to 236 Lbs today. Patient reports that he has been drinking beer, 40 ounce daily.  - will increase Lasix from 60 mg to 80 mg daily. Patient was advised to quit drinking beer or at least cut down on his drinking. - will increase his lisinopril dosage from 5 mg daily to 10 mg daily from now.

## 2013-03-23 NOTE — Patient Instructions (Addendum)
1. Please increase your lasix to 80 mg daily from now. Please increase your lisinopril to 10 mg daily from now. 2. Please stop drink beers, at least cut down on your drinking. 3. If you have worsening of your symptoms or new symptoms arise, please call the clinic ([phone]), or go to the ER immediately if symptoms are severe.  You have done great job in taking all your medications. I appreciate it very much. Please continue doing that.   Heart Failure Heart failure (HF) is a condition in which the heart has trouble pumping blood. This means your heart does not pump blood efficiently for your body to work well. In some cases of HF, fluid may back up into your lungs or you may have swelling (edema) in your lower legs. HF is a long-term (chronic) condition. It is important for you to take good care of yourself and follow your caregiver's treatment plan. CAUSES   Health conditions:  High blood pressure (hypertension) causes the heart muscle to work harder than normal. When pressure in the blood vessels is high, the heart needs to pump (contract) with more force in order to circulate blood throughout the body. High blood pressure eventually causes the heart to become stiff and weak.  Coronary artery disease (CAD) is the buildup of cholesterol and fat (plaques) in the arteries of the heart. The blockage in the arteries deprives the heart muscle of oxygen and blood. This can cause chest pain and may lead to a heart attack. High blood pressure can also contribute to CAD.  Heart attack (myocardial infarction) occurs when 1 or more arteries in the heart become blocked. The loss of oxygen damages the muscle tissue of the heart. When this happens, part of the heart muscle dies. The injured tissue does not contract as well and weakens the heart's ability to pump blood.  Abnormal heart valves can cause HF when the heart valves do not open and close properly. This makes the heart muscle pump harder to keep the  blood flowing.  Heart muscle disease (cardiomyopathy or myocarditis) is damage to the heart muscle from a variety of causes. These can include drug or alcohol abuse, infections, or unknown reasons. These can increase the risk of HF.  Lung disease makes the heart work harder because the lungs do not work properly. This can cause a strain on the heart leading it to fail.  Diabetes increases the risk of HF. High blood sugar contributes to high fat (lipid) levels in the blood. Diabetes can also cause slow damage to tiny blood vessels that carry important nutrients to the heart muscle. When the heart does not get enough oxygen and food, it can cause the heart to become weak and stiff. This leads to a heart that does not contract efficiently.  Other diseases can contribute to HF. These include abnormal heart rhythms, thyroid problems, and low blood counts (anemia).  Unhealthy lifestyle habits:  Obesity.  Smoking.  Eating foods high in fat and cholesterol.  Eating or drinking beverages high in salt.  Drug or alcohol abuse.  Lack of exercise. SYMPTOMS  HF symptoms may vary and can be hard to detect. Symptoms may include:  Shortness of breath with activity, such as climbing stairs.  Persistent cough.  Swelling of the feet, ankles, legs, or abdomen.  Unexplained weight gain.  Difficulty breathing when lying flat.  Waking from sleep because of the need to sit up and get more air.  Rapid heartbeat.  Fatigue and loss of  energy.  Feeling lightheaded or close to fainting. DIAGNOSIS  A diagnosis of HF is based on your history, symptoms, physical examination, and diagnostic tests. Diagnostic tests for HF may include:  EKG.  Chest X-ray.  Blood tests.  Exercise stress test.  Blood oxygen test (arterial blood gas).  Evaluation by a heart doctor (cardiologist).  Ultrasound evaluation of the heart (echocardiogram).  Heart artery test to look for blockages  (angiogram).  Radioactive imaging to look at the heart (radionuclide test). TREATMENT  Treatment is aimed at managing the symptoms of HF. Medicines, lifestyle changes, or surgical intervention may be necessary to treat HF.  Medicines to help treat HF may include:  Angiotensin-converting enzyme (ACE) inhibitors. These block the effects of a blood protein called angiotensin-converting enzyme. ACE inhibitors relax (dilate) the blood vessels and help lower blood pressure. This decreases the workload of the heart, slows the progression of HF, and improves symptoms.  Angiotensin receptor blockers (ARBs). These medications work similar to ACE inhibitors. ARBs may be an alternative for people who cannot tolerate an ACE inhibitor.  Aldosterone antagonists. This medication helps get rid of extra fluid from your body. This lowers the volume of blood the heart has to pump.  Water pills (diuretics). Diuretics cause the kidneys to remove salt and water from the blood. The extra fluid is removed by urination. By removing extra fluid from the body, diuretics help lower the workload of the heart and help prevent fluid buildup in the lungs so breathing is easier.  Beta blockers. These prevent the heart from beating too fast and improve heart muscle strength. Beta blockers help maintain a normal heart rate, control blood pressure, and improve HF symptoms.  Digitalis. This increases the force of the heartbeat and may be helpful to people with HF or heart rhythm problems.  Healthy lifestyle changes include:  Stopping smoking.  Eating a healthy diet. Avoid foods high in fat. Avoid foods fried in oil or made with fat. A dietician can help with healthy food choices.  Limiting how much salt you eat.  Limiting alcohol intake to no more than 1 drink per day for women and 2 drinks per day for men. Drinking more than that is harmful to your heart. If your heart has already been damaged by alcohol or you have severe  HF, drinking alcohol should be stopped completely.  Exercising as directed by your caregiver.  Surgical treatment for HF may include:  Procedures to open blocked arteries, repair damaged heart valves, or remove damaged heart muscle tissue.  A pacemaker to help heart muscle function and to control certain abnormal heart rhythms.  A defibrillator to possibly prevent sudden cardiac death. HOME CARE INSTRUCTIONS   Activity level. Your caregiver can help you determine what type of exercise program may be helpful. It is important to maintain your strength. Pace your physical activity to avoid shortness of breath or chest pain. Rest for 1 hour before and after meals. A cardiac rehabilitation program may be helpful to some people with HF.  Diet. Eat a heart healthy diet. Food choices should be low in saturated fat and cholesterol. Talk to a dietician to learn about heart healthy foods.  Salt intake. When you have HF, you need to limit the amount of salt you eat. Eat less than 1500 milligrams (mg) of salt per day or as recommended by your caregiver.  Weight monitoring. Weigh yourself every day. You should weigh yourself in the morning after you urinate and before you eat breakfast. Wear  the same amount of clothing each time you weigh yourself. Record your weight daily. Bring your recorded weights to your clinic visits. Tell your caregiver right away if you have gained 3 lb/1.4 kg in 1 day, or 5 lb/2.3 kg in a week or whatever amount you were told to report.  Blood pressure monitoring. This should be done as directed by your caregiver. A home blood pressure cuff can be purchased at a drugstore. Record your blood pressure numbers and bring them to your clinic visits. Tell your caregiver if you become dizzy or lightheaded upon standing up.  Smoking. If you are currently a smoker, it is time to quit. Nicotine makes your heart work harder by causing your blood vessels to constrict. Do not use nicotine gum  or patches before talking to your caregiver.  Follow up. Be sure to schedule a follow-up visit with your caregiver. Keep all your appointments. SEEK MEDICAL CARE IF:   Your weight increases by 3 lb/1.4 kg in 1 day or 5 lb/2.3 kg in a week.  You notice increasing shortness of breath that is unusual for you. This may happen during rest, sleep, or with activity.  You cough more than normal, especially with physical activity.  You notice more swelling in your hands, feet, ankles, or belly (abdomen).  You are unable to sleep because it is hard to breathe.  You cough up bloody mucus (sputum).  You begin to feel "jumping" or "fluttering" sensations (palpitations) in your chest. SEEK IMMEDIATE MEDICAL CARE IF:   You have severe chest pain or pressure which may include symptoms such as:  Pain or pressure in the arms, neck, jaw, or back.  Feeling sweaty.  Feeling sick to your stomach (nauseous).  Feeling short of breath while at rest.  Having a fast or irregular heartbeat.  You experience stroke symptoms. These symptoms include:  Facial weakness or numbness.  Weakness or numbness in an arm, leg, or on one side of your body.  Blurred vision.  Difficulty talking or thinking.  Dizziness or fainting.  Severe headache. MAKE SURE YOU:   Understand these instructions.  Will watch your condition.  Will get help right away if you are not doing well or get worse. Document Released: 12/09/2005 Document Revised: 06/09/2012 Document Reviewed: 03/23/2010 [facility] Patient Information 2013 [place], [place].

## 2013-03-24 LAB — BASIC METABOLIC PANEL WITH GFR
BUN: 12 mg/dL (ref 6–23)
Creat: 1.24 mg/dL (ref 0.50–1.35)
GFR, Est African American: 84 mL/min
GFR, Est Non African American: 72 mL/min
Glucose, Bld: 80 mg/dL (ref 70–99)

## 2013-03-26 ENCOUNTER — Encounter: Payer: Self-pay | Admitting: Cardiovascular Disease

## 2013-03-26 ENCOUNTER — Ambulatory Visit (INDEPENDENT_AMBULATORY_CARE_PROVIDER_SITE_OTHER): Payer: 59 | Admitting: Cardiovascular Disease

## 2013-03-26 ENCOUNTER — Encounter: Payer: Self-pay | Admitting: *Deleted

## 2013-03-26 VITALS — BP 159/77 | HR 87 | Ht 67.5 in | Wt 231.8 lb

## 2013-03-26 DIAGNOSIS — I428 Other cardiomyopathies: Secondary | ICD-10-CM

## 2013-03-26 DIAGNOSIS — I5023 Acute on chronic systolic (congestive) heart failure: Secondary | ICD-10-CM

## 2013-03-26 DIAGNOSIS — I420 Dilated cardiomyopathy: Secondary | ICD-10-CM

## 2013-03-26 DIAGNOSIS — I509 Heart failure, unspecified: Secondary | ICD-10-CM

## 2013-03-26 LAB — CBC WITH DIFFERENTIAL/PLATELET
Basophils Absolute: 0 10*3/uL (ref 0.0–0.1)
Eosinophils Absolute: 0.1 10*3/uL (ref 0.0–0.7)
Lymphocytes Relative: 44 % (ref 12.0–46.0)
MCHC: 32.9 g/dL (ref 30.0–36.0)
Monocytes Relative: 10.1 % (ref 3.0–12.0)
Neutrophils Relative %: 43.6 % (ref 43.0–77.0)
Platelets: 281 10*3/uL (ref 150.0–400.0)
RDW: 17 % — ABNORMAL HIGH (ref 11.5–14.6)

## 2013-03-26 LAB — BASIC METABOLIC PANEL
CO2: 28 mEq/L (ref 19–32)
Calcium: 9.4 mg/dL (ref 8.4–10.5)
Chloride: 101 mEq/L (ref 96–112)
Sodium: 138 mEq/L (ref 135–145)

## 2013-03-26 LAB — PROTIME-INR
INR: 0.9 ratio (ref 0.8–1.0)
Prothrombin Time: 9.3 s — ABNORMAL LOW (ref 10.2–12.4)

## 2013-03-26 MED ORDER — LISINOPRIL 20 MG PO TABS: 20.0000 mg | ORAL_TABLET | Freq: Every day | ORAL | Status: DC

## 2013-03-26 MED ORDER — FUROSEMIDE 40 MG PO TABS: ORAL_TABLET | ORAL | Status: DC

## 2013-03-26 NOTE — Progress Notes (Signed)
 History of Present Illness: 41 yo HTN, OSA, cardiomyopathy who is referred today for further evaluation of his cardiomyopathy. He has been seen in the [facility]. Initially admitted to [facility] 12/22/13 with volume overload and acute CHF. He was diuresed. Echo 12/23/12 with LVEF 40-45%. He was discharged home on Lasix and Coreg. Sleep study February 2014 with severe OSA. He has been drinking 4-6 beers per day for years. He has had uncontrolled HTN for years.   He tells me today that he has gained over 20 lbs in the last 2 months. He denies any chest pain or pressure. Breathing is normal. No PND, orthopnea, LE edema. No dyspnea with exertion.    Primary Care Physician: Niu, Xilin ([facility])  Last Lipid Profile:Lipid Panel     Component Value Date/Time   CHOL 146 12/23/2012 0515   TRIG 196* 12/23/2012 0515   HDL 34* 12/23/2012 0515   CHOLHDL 4.3 12/23/2012 0515   VLDL 39 12/23/2012 0515   LDLCALC 73 12/23/2012 0515     Past Medical History  Diagnosis Date  . Hypertension   . Sleep apnea 12/24/12    Sleep study February 2014  . Cardiomyopathy   . Chronic systolic CHF (congestive heart failure), NYHA class 3     Past Surgical History  Procedure Laterality Date  . Vasectomy      Current Outpatient Prescriptions  Medication Sig Dispense Refill  . aspirin EC 81 MG EC tablet Take 1 tablet (81 mg total) by mouth daily.  30 tablet  5  . carvedilol (COREG) 3.125 MG tablet Take 4 tablets (12.5 mg total) by mouth 2 (two) times daily with a meal.  120 tablet  5  . furosemide (LASIX) 40 MG tablet Take 2 tablets (80 mg total) by mouth daily.  30 tablet  5  . lisinopril (PRINIVIL,ZESTRIL) 5 MG tablet Take 2 tablets (10 mg total) by mouth daily.  30 tablet  3   No current facility-administered medications for this visit.    No Known Allergies  History   Social History  . Marital Status: Single    Spouse Name: N/A    Number of Children: 3  . Years of Education: N/A    Occupational History  . Works with furniture    Social History Main Topics  . Smoking status: Former Smoker -- 1.00 packs/day for 22 years    Types: Cigarettes    Quit date: 12/22/2012  . Smokeless tobacco: Not on file  . Alcohol Use: 15.0 oz/week    30 drink(s) per week  . Drug Use: No  . Sexually Active: Not on file   Other Topics Concern  . Not on file   Social History Narrative  . No narrative on file    Family History  Problem Relation Age of Onset  . Arrhythmia Maternal Grandfather     Atrial fibrillation  . CAD Neg Hx     Review of Systems:  As stated in the HPI and otherwise negative.   BP 159/77  Pulse 87  Ht 5' 7.5" (1.715 m)  Wt 231 lb 12.8 oz (105.144 kg)  BMI 35.75 kg/m2  Physical Examination: General: Well developed, well nourished, NAD HEENT: OP clear, mucus membranes moist SKIN: warm, dry. No rashes. Neuro: No focal deficits Musculoskeletal: Muscle strength 5/5 all ext Psychiatric: Mood and affect normal Neck: No JVD, no carotid bruits, no thyromegaly, no lymphadenopathy. Lungs:Clear bilaterally, no wheezes, rhonci, crackles Cardiovascular: Regular rate and rhythm. No murmurs,   gallops or rubs. Abdomen:Soft. Bowel sounds present. Non-tender.  Extremities: No lower extremity edema. Pulses are 2 + in the bilateral DP/PT.  EKG: NSR, rate 81 bpm. LAE.   Echo 12/23/12:  Left ventricle: The cavity size was normal. Wall thickness was increased in a pattern of mild LVH. Systolic function was mildly to moderately reduced. The estimated ejection fraction was in the range of 40% to 45%. Diffuse hypokinesis. Left ventricular diastolic function parameters were normal. - Aortic valve: Mild regurgitation. - Left atrium: The atrium was mildly dilated. - Right atrium: The atrium was moderately dilated. - Pulmonary arteries: PA peak pressure: 39mm Hg (S). - Pericardium, extracardiac: A trivial pericardial effusion was identified.   Assessment and Plan:    1. Cardiomyopathy: Question etiology. Will arrange right and left heart cath on Friday April 02, 2013 to measure pressures and exclude CAD. Etoh use or HTN may also be a reason for his cardiomyopathy. Continue Coreg and ASA.    2. Acute on chronic systolic CHF: Continue current therapy. Will increase Lasix to 80 mg po QAM and 40 mg po QPM. Will increase Lisinopril to 20 mg po Qdaily.   3. HTN: BP elevated. Increase Lisinopril as above  4. Etoh abuse: He has stopped drinking.  

## 2013-03-26 NOTE — Patient Instructions (Addendum)
Your physician recommends that you schedule a follow-up appointment in:  4 weeks  Your physician has requested that you have a cardiac catheterization. Cardiac catheterization is used to diagnose and/or treat various heart conditions. Doctors may recommend this procedure for a number of different reasons. The most common reason is to evaluate chest pain. Chest pain can be a symptom of coronary artery disease (CAD), and cardiac catheterization can show whether plaque is narrowing or blocking your heart's arteries. This procedure is also used to evaluate the valves, as well as measure the blood flow and oxygen levels in different parts of your heart. For further information please visit [email or web address] Please follow instruction sheet, as given. Scheduled for April 02, 2013   Your physician has recommended you make the following change in your medication:  Increase lisinopril to 20 mg by mouth daily. Increase furosemide to 80 mg every morning and 40 mg every evening.       Coronary Angiography Coronary angiography is an X-ray procedure used to look at the arteries in the heart. In this procedure, a dye is injected through a long, hollow tube (catheter). The catheter is about the size of a piece of cooked spaghetti. The catheter injects a dye into an artery in your groin. X-rays are then taken to show if there is a blockage in the arteries of your heart. BEFORE THE PROCEDURE   Let your caregiver know if you have allergies to shellfish or contrast dye. Also let your caregiver know if you have kidney problems or failure.  Do not eat or drink starting from midnight up to the time of the procedure, or as directed.  You may drink enough water to take your medications the morning of the procedure if you were instructed to do so.  You should be at the hospital or outpatient facility where the procedure is to be done 60 minutes prior to the procedure or as directed. PROCEDURE  You may be given an IV  medication to help you relax before the procedure.  You will be prepared for the procedure by washing and shaving the area where the catheter will be inserted. This is usually done in the groin but may be done in the fold of your arm by your elbow.  A medicine will be given to numb your groin where the catheter will be inserted.  A specially trained doctor will insert the catheter into an artery in your groin. The catheter is guided by using a special type of X-ray (fluoroscopy) to the blood vessel being examined.  A special dye is then injected into the catheter and X-rays are taken. The dye helps to show where any narrowing or blockages are located in the heart arteries. AFTER THE PROCEDURE   After the procedure you will be kept in bed lying flat for several hours. You will be instructed to not bend or cross your legs.  The groin insertion site will be watched and checked frequently.  The pulse in your feet will be checked frequently.  Additional blood tests, X-rays and an EKG may be done.  You may stay in the hospital overnight for observation. SEEK IMMEDIATE MEDICAL CARE IF:   You develop chest pain, shortness of breath, feel faint, or pass out.  There is bleeding, swelling, or drainage from the catheter insertion site.  You develop pain, discoloration, coldness, or severe bruising in the leg or area where the catheter was inserted.  You have a fever. Document Released: 06/15/2003 Document Revised: 03/02/2012 Document  Reviewed: 08/03/2008 [facility] Patient Information 2013 [place], [place].

## 2013-03-29 ENCOUNTER — Telehealth: Payer: Self-pay | Admitting: Cardiovascular Disease

## 2013-03-29 NOTE — Telephone Encounter (Signed)
**Note De-Identified Colin Walton Obfuscation** Pt given his activation code for My Chart, he repeated code back and verbalized understanding.

## 2013-03-29 NOTE — Telephone Encounter (Signed)
New Prob   Pt having some issues with MyChart, would like some assistance. Please call.

## 2013-03-29 NOTE — Telephone Encounter (Signed)
New problem   Pt need code to MyChart to access his chart. Please call pt concerning this matter.

## 2013-03-29 NOTE — Telephone Encounter (Signed)
LMTCB on 03/29/13@1 :10pm and I left the number for him to call for assistance with MyChart [phone]).

## 2013-04-02 ENCOUNTER — Inpatient Hospital Stay (HOSPITAL_BASED_OUTPATIENT_CLINIC_OR_DEPARTMENT_OTHER)
Admission: RE | Admit: 2013-04-02 | Discharge: 2013-04-02 | Disposition: A | Discharge status: Home / Self Care | Payer: 59 | Source: Ambulatory Visit | Attending: Cardiovascular Disease | Admitting: Cardiovascular Disease

## 2013-04-02 ENCOUNTER — Encounter (HOSPITAL_BASED_OUTPATIENT_CLINIC_OR_DEPARTMENT_OTHER)
Admission: RE | Disposition: A | Discharge status: Home / Self Care | Payer: Self-pay | Source: Ambulatory Visit | Attending: Cardiovascular Disease

## 2013-04-02 DIAGNOSIS — I5022 Chronic systolic (congestive) heart failure: Secondary | ICD-10-CM | POA: Insufficient documentation

## 2013-04-02 DIAGNOSIS — I10 Essential (primary) hypertension: Secondary | ICD-10-CM | POA: Insufficient documentation

## 2013-04-02 DIAGNOSIS — G4733 Obstructive sleep apnea (adult) (pediatric): Secondary | ICD-10-CM | POA: Insufficient documentation

## 2013-04-02 DIAGNOSIS — I509 Heart failure, unspecified: Secondary | ICD-10-CM

## 2013-04-02 DIAGNOSIS — I5023 Acute on chronic systolic (congestive) heart failure: Secondary | ICD-10-CM

## 2013-04-02 DIAGNOSIS — I428 Other cardiomyopathies: Secondary | ICD-10-CM | POA: Insufficient documentation

## 2013-04-02 SURGERY — JV LEFT AND RIGHT HEART CATHETERIZATION WITH CORONARY ANGIOGRAM
Anesthesia: Moderate Sedation | Laterality: Left

## 2013-04-02 MED ORDER — SODIUM CHLORIDE 0.9 % IJ SOLN: 3.0000 mL | Freq: Two times a day (BID) | INTRAMUSCULAR | Status: DC

## 2013-04-02 MED ORDER — SODIUM CHLORIDE 0.9 % IV SOLN: INTRAVENOUS | Status: AC

## 2013-04-02 MED ORDER — ASPIRIN 81 MG PO CHEW
324.0000 mg | CHEWABLE_TABLET | ORAL | Status: AC
  Administered 2013-04-02: 243 mg via ORAL

## 2013-04-02 MED ORDER — SODIUM CHLORIDE 0.9 % IV SOLN
250.0000 mL | INTRAVENOUS | Status: DC | PRN
Start: 2013-04-02 — End: 2013-04-02

## 2013-04-02 MED ORDER — SODIUM CHLORIDE 0.9 % IJ SOLN: 3.0000 mL | INTRAMUSCULAR | Status: DC | PRN

## 2013-04-02 MED ORDER — ONDANSETRON HCL 4 MG/2ML IJ SOLN: 4.0000 mg | Freq: Four times a day (QID) | INTRAMUSCULAR | Status: DC | PRN

## 2013-04-02 MED ORDER — DIAZEPAM 5 MG PO TABS
5.0000 mg | ORAL_TABLET | ORAL | Status: AC
  Administered 2013-04-02: 5 mg via ORAL

## 2013-04-02 MED ORDER — SODIUM CHLORIDE 0.9 % IV SOLN: INTRAVENOUS | Status: DC

## 2013-04-02 MED ORDER — ACETAMINOPHEN 325 MG PO TABS: 650.0000 mg | ORAL_TABLET | ORAL | Status: DC | PRN

## 2013-04-02 NOTE — OR Nursing (Signed)
Dr McAlhany at bedside to discuss results and treatment plan with pt and family 

## 2013-04-02 NOTE — OR Nursing (Signed)
Meal served 

## 2013-04-02 NOTE — CV Procedure (Signed)
    Cardiac Catheterization Operative Report  KHALEEF RUBY [id number] 4/11/20149:07 AM Lorretta Harp, MD  Procedure Performed:  1. Left Heart Catheterization 2. Selective Coronary Angiography 3. Right Heart Catheterization 4. Left ventricular angiogram  Operator: Verne Carrow, MD  Indication:   42 yo male with history of etoh abuse, HTN, OSA, recent diagnosis of CHF and found to have a cardiomyopathy who is here today for further evaluation of his cardiomyopathy with right and left heart cath.  He has been seen in the IM residents clinic. Initially admitted to [facility] 12/22/13 with volume overload and acute CHF. He was diuresed. Echo 12/23/12 with LVEF 40-45%. He was discharged home on Lasix and Coreg. Sleep study February 2014 with severe OSA. He has been drinking 4-6 beers per day for years. He has had uncontrolled HTN for years.                                Procedure Details: The risks, benefits, complications, treatment options, and expected outcomes were discussed with the patient. The patient and/or family concurred with the proposed plan, giving informed consent. The patient was brought to the cath lab after IV hydration was begun and oral premedication was given. The patient was further sedated with Versed and Fentanyl. The patient was prepped and draped in the usual manner. Using the modified Seldinger access technique, a 4 French sheath was placed in the femoral artery. A 6 French sheath was inserted into the right femoral vein. A multi-purpose catheter was used to perform a right heart catheterization. Standard diagnostic catheters were used to perform selective coronary angiography. A pigtail catheter was used to perform a left ventricular angiogram. There were no immediate complications. The patient was taken to the recovery area in stable condition.   Hemodynamic Findings: Ao:  120/60              LV: 119/17/23 RA:  10          RV: 37/12/13 PA:  37/18 (mean  26) PCWP:  9 Fick Cardiac Output:  Fick Cardiac Index: Central Aortic Saturation: 96% Pulmonary Artery Saturation: 63%   Angiographic Findings:  Left main: No obstructive disease.   Left Anterior Descending Artery: Large caliber vessel that reaches the apex but becomes very small caliber at the apex. There is a moderate caliber diagonal branch with no obstructive disease.   Circumflex Artery: Large caliber vessel with large obtuse marginal branch. No obstructive disease noted.   Right Coronary Artery: Large, dominant vessel with no obstructive disease noted.   Left Ventricular Angiogram: LVEF=55%.   Impression: 1. No angiographic evidence of CAD 2. Low normal LV systolic function 3. Non-ischemic cardiomyopathy  Recommendations: Continue medical management.        Complications:  None; patient tolerated the procedure well.

## 2013-04-02 NOTE — OR Nursing (Signed)
Tegaderm dressing applied, site level 0, bedrest begins at 0920

## 2013-04-02 NOTE — H&P (View-Only) (Signed)
History of Present Illness: 42 yo HTN, OSA, cardiomyopathy who is referred today for further evaluation of his cardiomyopathy. He has been seen in the IM residents clinic. Initially admitted to [facility] 12/22/13 with volume overload and acute CHF. He was diuresed. Echo 12/23/12 with LVEF 40-45%. He was discharged home on Lasix and Coreg. Sleep study February 2014 with severe OSA. He has been drinking 4-6 beers per day for years. He has had uncontrolled HTN for years.   He tells me today that he has gained over 20 lbs in the last 2 months. He denies any chest pain or pressure. Breathing is normal. No PND, orthopnea, LE edema. No dyspnea with exertion.    Primary Care Physician: Lorretta Harp ([facility])  Last Lipid Profile:Lipid Panel     Component Value Date/Time   CHOL 146 12/23/2012 0515   TRIG 196* 12/23/2012 0515   HDL 34* 12/23/2012 0515   CHOLHDL 4.3 12/23/2012 0515   VLDL 39 12/23/2012 0515   LDLCALC 73 12/23/2012 0515     Past Medical History  Diagnosis Date  . Hypertension   . Sleep apnea 12/24/12    Sleep study February 2014  . Cardiomyopathy   . Chronic systolic CHF (congestive heart failure), NYHA class 3     Past Surgical History  Procedure Laterality Date  . Vasectomy      Current Outpatient Prescriptions  Medication Sig Dispense Refill  . aspirin EC 81 MG EC tablet Take 1 tablet (81 mg total) by mouth daily.  30 tablet  5  . carvedilol (COREG) 3.125 MG tablet Take 4 tablets (12.5 mg total) by mouth 2 (two) times daily with a meal.  120 tablet  5  . furosemide (LASIX) 40 MG tablet Take 2 tablets (80 mg total) by mouth daily.  30 tablet  5  . lisinopril (PRINIVIL,ZESTRIL) 5 MG tablet Take 2 tablets (10 mg total) by mouth daily.  30 tablet  3   No current facility-administered medications for this visit.    No Known Allergies  History   Social History  . Marital Status: Single    Spouse Name: N/A    Number of Children: 3  . Years of Education: N/A    Occupational History  . Works with furniture    Social History Main Topics  . Smoking status: Former Smoker -- 1.00 packs/day for 22 years    Types: Cigarettes    Quit date: 12/22/2012  . Smokeless tobacco: Not on file  . Alcohol Use: 15.0 oz/week    30 drink(s) per week  . Drug Use: No  . Sexually Active: Not on file   Other Topics Concern  . Not on file   Social History Narrative  . No narrative on file    Family History  Problem Relation Age of Onset  . Arrhythmia Maternal Grandfather     Atrial fibrillation  . CAD Neg Hx     Review of Systems:  As stated in the HPI and otherwise negative.   BP 159/77  Pulse 87  Ht 5' 7.5" (1.715 m)  Wt 231 lb 12.8 oz (105.144 kg)  BMI 35.75 kg/m2  Physical Examination: General: Well developed, well nourished, NAD HEENT: OP clear, mucus membranes moist SKIN: warm, dry. No rashes. Neuro: No focal deficits Musculoskeletal: Muscle strength 5/5 all ext Psychiatric: Mood and affect normal Neck: No JVD, no carotid bruits, no thyromegaly, no lymphadenopathy. Lungs:Clear bilaterally, no wheezes, rhonci, crackles Cardiovascular: Regular rate and rhythm. No murmurs,  gallops or rubs. Abdomen:Soft. Bowel sounds present. Non-tender.  Extremities: No lower extremity edema. Pulses are 2 + in the bilateral DP/PT.  EKG: NSR, rate 81 bpm. LAE.   Echo 12/23/12:  Left ventricle: The cavity size was normal. Wall thickness was increased in a pattern of mild LVH. Systolic function was mildly to moderately reduced. The estimated ejection fraction was in the range of 40% to 45%. Diffuse hypokinesis. Left ventricular diastolic function parameters were normal. - Aortic valve: Mild regurgitation. - Left atrium: The atrium was mildly dilated. - Right atrium: The atrium was moderately dilated. - Pulmonary arteries: PA peak pressure: 39mm Hg (S). - Pericardium, extracardiac: A trivial pericardial effusion was identified.   Assessment and Plan:    1. Cardiomyopathy: Question etiology. Will arrange right and left heart cath on Friday April 02, 2013 to measure pressures and exclude CAD. Etoh use or HTN may also be a reason for his cardiomyopathy. Continue Coreg and ASA.    2. Acute on chronic systolic CHF: Continue current therapy. Will increase Lasix to 80 mg po QAM and 40 mg po QPM. Will increase Lisinopril to 20 mg po Qdaily.   3. HTN: BP elevated. Increase Lisinopril as above  4. Etoh abuse: He has stopped drinking.

## 2013-04-02 NOTE — OR Nursing (Signed)
Discharge instructions reviewed and signed, pt stated understanding, ambulated in hall without difficulty, site level 0, transported to mother's car via wheelchair 

## 2013-04-02 NOTE — Interval H&P Note (Signed)
History and Physical Interval Note:  04/02/2013 8:17 AM  Colin Walton  has presented today for cardiac cath with the diagnosis of chf.  The various methods of treatment have been discussed with the patient and family. After consideration of risks, benefits and other options for treatment, the patient has consented to  Procedure(s): JV LEFT AND RIGHT HEART CATHETERIZATION WITH CORONARY ANGIOGRAM (Left) as a surgical intervention .  The patient's history has been reviewed, patient examined, no change in status, stable for surgery.  I have reviewed the patient's chart and labs.  Questions were answered to the patient's satisfaction.     MCALHANY,CHRISTOPHER

## 2013-04-05 LAB — POCT I-STAT 3, VENOUS BLOOD GAS (G3P V)
Bicarbonate: 27.5 mEq/L — ABNORMAL HIGH (ref 20.0–24.0)
O2 Saturation: 63 %
pCO2, Ven: 52.1 mmHg — ABNORMAL HIGH (ref 45.0–50.0)
pO2, Ven: 36 mmHg (ref 30.0–45.0)

## 2013-04-05 LAB — POCT I-STAT 3, ART BLOOD GAS (G3+)
Bicarbonate: 27.9 mEq/L — ABNORMAL HIGH (ref 20.0–24.0)
TCO2: 29 mmol/L (ref 0–100)
pH, Arterial: 7.348 — ABNORMAL LOW (ref 7.350–7.450)

## 2013-04-21 ENCOUNTER — Encounter: Payer: Self-pay | Admitting: Cardiovascular Disease

## 2013-04-21 ENCOUNTER — Ambulatory Visit (INDEPENDENT_AMBULATORY_CARE_PROVIDER_SITE_OTHER): Payer: 59 | Admitting: Cardiovascular Disease

## 2013-04-21 VITALS — BP 150/78 | HR 84 | Ht 67.0 in | Wt 242.8 lb

## 2013-04-21 DIAGNOSIS — I428 Other cardiomyopathies: Secondary | ICD-10-CM

## 2013-04-21 DIAGNOSIS — I5022 Chronic systolic (congestive) heart failure: Secondary | ICD-10-CM

## 2013-04-21 DIAGNOSIS — I509 Heart failure, unspecified: Secondary | ICD-10-CM

## 2013-04-21 MED ORDER — LISINOPRIL 40 MG PO TABS: 40.0000 mg | ORAL_TABLET | Freq: Every day | ORAL | Status: DC

## 2013-04-21 MED ORDER — FUROSEMIDE 80 MG PO TABS: 80.0000 mg | ORAL_TABLET | Freq: Two times a day (BID) | ORAL | Status: DC

## 2013-04-21 NOTE — Progress Notes (Signed)
History of Present Illness: 42 yo male with history of HTN, OSA, cardiomyopathy who is here today for cardiac follow up. He was seen as a new patient 03/26/13 for further evaluation of his cardiomyopathy. He has been seen in the IM residents clinic. Initially admitted to [facility] 12/22/13 with volume overload and acute CHF. He was diuresed. Echo 12/23/12 with LVEF 40-45%. He was discharged home on Lasix and Coreg. Sleep study February 2014 with severe OSA. He has been drinking 4-6 beers per day for years. He has had uncontrolled HTN for years. I performed a right and left heart cath on  He tells me today that he has gained over 10 lbs in the last 3 weeks. He denies any chest pain or pressure. Breathing is normal. No PND, orthopnea, LE edema. No dyspnea with exertion. Feels well. Taking all meds.   Primary Care Physician: Lorretta Harp ([facility])  Last Lipid Profile:Lipid Panel     Component Value Date/Time   CHOL 146 12/23/2012 0515   TRIG 196* 12/23/2012 0515   HDL 34* 12/23/2012 0515   CHOLHDL 4.3 12/23/2012 0515   VLDL 39 12/23/2012 0515   LDLCALC 73 12/23/2012 0515     Past Medical History  Diagnosis Date  . Hypertension   . Sleep apnea 12/24/12    Sleep study February 2014  . Cardiomyopathy   . Chronic systolic CHF (congestive heart failure), NYHA class 3     Past Surgical History  Procedure Laterality Date  . Vasectomy      Current Outpatient Prescriptions  Medication Sig Dispense Refill  . aspirin EC 81 MG EC tablet Take 1 tablet (81 mg total) by mouth daily.  30 tablet  5  . carvedilol (COREG) 3.125 MG tablet Take 4 tablets (12.5 mg total) by mouth 2 (two) times daily with a meal.  120 tablet  5  . furosemide (LASIX) 40 MG tablet Take 2 tablets by mouth every morning and 1 tablet by mouth every afternoon  90 tablet  6  . lisinopril (PRINIVIL,ZESTRIL) 20 MG tablet Take 1 tablet (20 mg total) by mouth daily.  30 tablet  6   No current facility-administered medications for  this visit.    No Known Allergies  History   Social History  . Marital Status: Single    Spouse Name: N/A    Number of Children: 3  . Years of Education: N/A   Occupational History  . Works with furniture    Social History Main Topics  . Smoking status: Former Smoker -- 1.00 packs/day for 22 years    Types: Cigarettes    Quit date: 12/22/2012  . Smokeless tobacco: Not on file  . Alcohol Use: 15.0 oz/week    30 drink(s) per week  . Drug Use: No  . Sexually Active: Not on file   Other Topics Concern  . Not on file   Social History Narrative  . No narrative on file    Family History  Problem Relation Age of Onset  . Arrhythmia Maternal Grandfather     Atrial fibrillation  . CAD Neg Hx     Review of Systems:  As stated in the HPI and otherwise negative.   BP 150/78  Pulse 84  Ht 5\' 7"  (1.702 m)  Wt 242 lb 12.8 oz (110.133 kg)  BMI 38.02 kg/m2  Physical Examination: General: Well developed, well nourished, NAD HEENT: OP clear, mucus membranes moist SKIN: warm, dry. No rashes. Neuro: No focal deficits Musculoskeletal:  Muscle strength 5/5 all ext Psychiatric: Mood and affect normal Neck: No JVD, no carotid bruits, no thyromegaly, no lymphadenopathy. Lungs:Clear bilaterally, no wheezes, rhonci, crackles Cardiovascular: Regular rate and rhythm. No murmurs, gallops or rubs. Abdomen:Soft. Bowel sounds present. Non-tender.  Extremities: No lower extremity edema. Pulses are 2 + in the bilateral DP/PT.  Cardiac cath 04/02/13: Left main: No obstructive disease.  Left Anterior Descending Artery: Large caliber vessel that reaches the apex but becomes very small caliber at the apex. There is a moderate caliber diagonal branch with no obstructive disease.  Circumflex Artery: Large caliber vessel with large obtuse marginal branch. No obstructive disease noted.  Right Coronary Artery: Large, dominant vessel with no obstructive disease noted.  Left Ventricular Angiogram:  LVEF=55%.  Impression:  1. No angiographic evidence of CAD  2. Low normal LV systolic function  3. Non-ischemic cardiomyopathy   Assessment and Plan:   1. Non-ischemic Cardiomyopathy: Likely due to etoh abuse or HTN. Continue medical therapy   2. Acute on chronic systolic CHF: His weight is up by 10 lbs. No SOB. Continue Coreg. Will increase Lasix to 80 mg po BID. Will increase Lisinopril to 40 mg po Qdaily.   3. HTN: BP elevated. Increase Lisinopril to 40 mg po Qdaily.   4. Etoh abuse: He has stopped drinking.

## 2013-04-21 NOTE — Patient Instructions (Addendum)
Your physician recommends that you schedule a follow-up appointment in:  3 months. --July 20, 2013 at 4:15  Your physician has recommended you make the following change in your medication:  Increase furosemide to 80 mg by mouth twice daily. Increase lisinopril to 40 mg by mouth daily

## 2013-04-23 ENCOUNTER — Ambulatory Visit (INDEPENDENT_AMBULATORY_CARE_PROVIDER_SITE_OTHER): Payer: 59 | Admitting: Internal Medicine

## 2013-04-23 ENCOUNTER — Encounter: Payer: Self-pay | Admitting: Internal Medicine

## 2013-04-23 VITALS — BP 140/80 | HR 85 | Temp 98.6°F | Ht 67.0 in | Wt 243.0 lb

## 2013-04-23 DIAGNOSIS — F1010 Alcohol abuse, uncomplicated: Secondary | ICD-10-CM

## 2013-04-23 DIAGNOSIS — I509 Heart failure, unspecified: Secondary | ICD-10-CM

## 2013-04-23 DIAGNOSIS — I10 Essential (primary) hypertension: Secondary | ICD-10-CM

## 2013-04-23 MED ORDER — CARVEDILOL 12.5 MG PO TABS: 12.5000 mg | ORAL_TABLET | Freq: Two times a day (BID) | ORAL | Status: DC

## 2013-04-23 NOTE — Progress Notes (Signed)
Subjective:   Patient ID: Colin Walton male   DOB: May 20, 1971 41 y.o.   MRN: [id number]  HPI: Colin Walton is a 42 y.o. male with PHM significant as outlined below who presented to the clinic for a regular follow up .  1. CHF: patient was evaluated on 04/21/13 at cardiology office and was noted to have a 10 pound weight gain. Patient was advised to increase Lasix to 80 mg twice a day as well as lisinopril 40 mg daily. Patient noted he has been weighing himself every day. He noticed a 1 pound loss. Denies today chest pain, shortness of breath, leg swelling.   2. Alcohol use: Patient continues to drink but has cut down significantly. Patient currently drinks one beer a day. He wouldn't use to drink 40 ounce of beer a day along with brandy and other liquor. Patient noted he does not want any help. He had stopped smoking by himself and he would like to do this seen with alcohol. I underlined the importance of discontinuing drinking alcohol in the setting of severe CHF. Patient noted he understood and he will try his best.    Past Medical History  Diagnosis Date  . Hypertension   . Sleep apnea 12/24/12    Sleep study February 2014  . Cardiomyopathy   . Chronic systolic CHF (congestive heart failure), NYHA class 3    Current Outpatient Prescriptions  Medication Sig Dispense Refill  . aspirin EC 81 MG EC tablet Take 1 tablet (81 mg total) by mouth daily.  30 tablet  5  . carvedilol (COREG) 3.125 MG tablet Take 4 tablets (12.5 mg total) by mouth 2 (two) times daily with a meal.  120 tablet  5  . furosemide (LASIX) 80 MG tablet Take 1 tablet (80 mg total) by mouth 2 (two) times daily.  60 tablet  6  . lisinopril (PRINIVIL,ZESTRIL) 40 MG tablet Take 1 tablet (40 mg total) by mouth daily.  30 tablet  6   No current facility-administered medications for this visit.   Family History  Problem Relation Age of Onset  . Arrhythmia Maternal Grandfather     Atrial fibrillation  . CAD Neg Hx     History   Social History  . Marital Status: Single    Spouse Name: N/A    Number of Children: 3  . Years of Education: N/A   Occupational History  . Works with furniture    Social History Main Topics  . Smoking status: Former Smoker -- 1.00 packs/day for 22 years    Types: Cigarettes    Quit date: 12/22/2012  . Smokeless tobacco: None  . Alcohol Use: No     Comment: x 1 week  . Drug Use: No  . Sexually Active: None   Other Topics Concern  . None   Social History Narrative  . None   Review of Systems: Constitutional: Denies fever, chills, diaphoresis, appetite change and fatigue.  Respiratory: Denies SOB, DOE, cough, chest tightness,  and wheezing.   Cardiovascular: Denies chest pain, palpitations and leg swelling.  Gastrointestinal: Denies nausea, vomiting, abdominal pain, diarrhea, constipation, blood in stool and abdominal distention.   Neurological: Denies dizziness, headaches.    Objective:  Physical Exam: Filed Vitals:   04/23/13 1527  BP: 140/80  Pulse: 100  Temp: 98.6 F (37 C)  TempSrc: Oral  Height: 5\' 7"  (1.702 m)  Weight: 243 lb (110.224 kg)  SpO2: 95%   Constitutional: Vital signs reviewed.  Patient is  a well-developed and well-nourished male in no acute distress and cooperative with exam. Alert and oriented x3.   Neck: Supple,  Cardiovascular: RRR, S1 normal, S2 normal, no MRG, pulses symmetric and intact bilaterally Pulmonary/Chest: CTAB, no wheezes, rales, or rhonchi Abdominal: Soft. Non-tender, non-distended, bowel sounds are normal,  Neurological: A&O x3, Strength is normal and symmetric bilaterally,

## 2013-04-24 DIAGNOSIS — I10 Essential (primary) hypertension: Secondary | ICD-10-CM | POA: Insufficient documentation

## 2013-04-24 DIAGNOSIS — F1010 Alcohol abuse, uncomplicated: Secondary | ICD-10-CM | POA: Insufficient documentation

## 2013-04-24 NOTE — Assessment & Plan Note (Signed)
Patient continues to drink but has cut down significantly. Counseled on the importance of discontinuing alcohol in the setting of  severe CHF the

## 2013-04-24 NOTE — Assessment & Plan Note (Signed)
Recommended to continue Lasix 80 mg twice a day along with lisinopril 40 mg daily. I provided patient a prescription for Coreg 12.5 mg twice a day since he was taking 4 tablets of 3.125 mg twice a day daily.

## 2013-04-28 ENCOUNTER — Telehealth: Payer: Self-pay | Admitting: Cardiovascular Disease

## 2013-04-28 NOTE — Telephone Encounter (Signed)
New problem    Was told to call back speak with nurse.

## 2013-04-28 NOTE — Telephone Encounter (Signed)
Pt advised,verbalized understanding. 

## 2013-04-28 NOTE — Telephone Encounter (Signed)
Continue current lasix regimen for now and follow weights daily at home. If any increase in weight, he should let us know? Thanks, chris

## 2013-04-28 NOTE — Telephone Encounter (Signed)
His lasix was increased to 80mg  bid at the time of office visit 04/21/13.  His weight in our office 04/21/13 was 242 lbs. Pt states he did not weigh on his home scales the day he saw Dr Clifton James 04/21/13 but our scales and his scales are off by 5-7 pounds. Today pt weighed 237lbs on his scales at home. He states he usually weights 236-237lbs on his home scales. He feels he has only lost a pound or so since his lasix was increased 04/21/13. He states he was asked to call back if he had not lost a significant amount of weight since lasix was increased 04/21/13.  He states he feels fine with no increase in SOB or edema. I will forward to Dr Clifton James for review.

## 2013-04-28 NOTE — Telephone Encounter (Signed)
Spoke with patient. Pt saw Dr Clifton James 04/21/13.

## 2013-05-25 ENCOUNTER — Encounter: Payer: Self-pay | Admitting: Internal Medicine

## 2013-05-25 ENCOUNTER — Ambulatory Visit (INDEPENDENT_AMBULATORY_CARE_PROVIDER_SITE_OTHER): Payer: 59 | Admitting: Internal Medicine

## 2013-05-25 VITALS — BP 155/78 | HR 83 | Temp 97.6°F | Ht 67.0 in | Wt 250.2 lb

## 2013-05-25 DIAGNOSIS — F1010 Alcohol abuse, uncomplicated: Secondary | ICD-10-CM

## 2013-05-25 DIAGNOSIS — I509 Heart failure, unspecified: Secondary | ICD-10-CM

## 2013-05-25 DIAGNOSIS — I5022 Chronic systolic (congestive) heart failure: Secondary | ICD-10-CM

## 2013-05-25 MED ORDER — CARVEDILOL 25 MG PO TABS: 25.0000 mg | ORAL_TABLET | Freq: Two times a day (BID) | ORAL | Status: DC

## 2013-05-25 NOTE — Assessment & Plan Note (Addendum)
Patient continued to drink alcohol but has cut down. Patient was educated again for the importance of quitting alcohol. He completed understand the importance of quitting alcohol. He would like to try his best to cut down his drinking further and finally stop it. He said he does not need help for quitting alcohol.

## 2013-05-25 NOTE — Assessment & Plan Note (Addendum)
patient's BW increased significantly from his dry weight of 213 to 250 Lbs. however patient is completely asymptomatic. Physical examination hs no signs of fluid overload. Lungs is clear to auscultation bilaterally. There is no any edema in legs. It ss a not completely clear why patient gain so much weight. She quit smoking in last December which may partially explain weight gain. Of note, his recent cardiac cath on 04/02/13 showed no evidence of CAD and Left ventricular EF is 55% which is better than his previous EF of 40 to 45% .   -I discussed with Dr. Dalphine Handing. Since he is completely asymptomatic. Clinically there is no evidence of fluid overload. We will observe him closely for 2 weeks and then reevaluate him  in clinic. -will continue current regimen -Check a BMP today -will increase his dose from a 12.5 to 25 mg twice a day since his HR is not beta-blocked. This change should also help his bp.

## 2013-05-25 NOTE — Patient Instructions (Signed)
1. Please increase your Coreg to 25 mg daily from now.  2. Please come back in 2 weeks. 3. If you have worsening of your symptoms or new symptoms arise, please call the clinic ([phone]), or go to the ER immediately if symptoms are severe.  You have done great job in taking all your medications. I appreciate it very much. Please continue doing that.

## 2013-05-25 NOTE — Progress Notes (Addendum)
Patient ID: Colin Walton, male   DOB: January 05, 1971, 42 y.o.   MRN: [id number] Subjective:   Patient ID: Colin Walton male   DOB: 10-Dec-1971 42 y.o.   MRN: [id number]  CC:  Follow up visit.       HPI:  Colin Walton is a 42 y.o.  man with past medical history as outlined below, who presents for a followup visit today.  1. CHF:  Patient was diagnosed with congestive heart failure 12/22/12. Patient had a 2-D echo on 12/23/12, demonstrating EF = 40-45%, with bilateral atrial enlargement (R>L), and elevated pulmo artery pressure (39mm Hg). Patient was evaluated on 04/21/13 at cardiology office and was noted to have a 10 pound weight gain. Patient was advised to increase Lasix to 80 mg twice a day as well as lisinopril 40 mg daily. Patient's.weight continued to increase from 242 to 250 Lbs today. Patient is completely asymptomatic. He does not have chest pain, shortness of breath, leg swelling, PNA or orthopnea. Patient is doing painting work. He did not noticed any shortness of breath in work. Of note , his recent cardiac cath on 04/02/13 showed left ventricular EF of 55% without any evidence of CAD.  BW:  250 Lbs on 05/25/13 242 Lbs on 04/21/13 231 Lbs on 04/02/13 220 Lbs on 01/18/13 213 Lbs on 02/12/13 (likely his dry weight) 220 Lbs on 03/02/13 236 Lbs on 03/23/13 (Today)  2. Alcohol use: Patient continues to drink, but has cut down significantly. Patient currently drinks one to two beers a day. Patient noted he does not want any help. He had stopped smoking by himself and he would like to do this seen with alcohol. He underlined the importance of discontinuing drinking alcohol in the setting of severe CHF. Patient noted he understood and he will try his best in cutting down his drinking and finally stop it.   ROS: Denies fever, chills, headaches, abdominal pain,diarrhea, constipation, dysuria, urgency, frequency, hematuria, joint pain or leg swelling. He had sleep study.     Past Medical  History  Diagnosis Date  . Hypertension   . Sleep apnea 12/24/12    Sleep study February 2014  . Cardiomyopathy   . Chronic systolic CHF (congestive heart failure), NYHA class 3    Current Outpatient Prescriptions  Medication Sig Dispense Refill  . aspirin EC 81 MG EC tablet Take 1 tablet (81 mg total) by mouth daily.  30 tablet  5  . carvedilol (COREG) 25 MG tablet Take 1 tablet (25 mg total) by mouth 2 (two) times daily with a meal.  60 tablet  2  . furosemide (LASIX) 80 MG tablet Take 1 tablet (80 mg total) by mouth 2 (two) times daily.  60 tablet  6  . lisinopril (PRINIVIL,ZESTRIL) 40 MG tablet Take 1 tablet (40 mg total) by mouth daily.  30 tablet  6   No current facility-administered medications for this visit.   Family History  Problem Relation Age of Onset  . Arrhythmia Maternal Grandfather     Atrial fibrillation  . CAD Neg Hx    History   Social History  . Marital Status: Single    Spouse Name: N/A    Number of Children: 3  . Years of Education: N/A   Occupational History  . Works with furniture    Social History Main Topics  . Smoking status: Former Smoker -- 1.00 packs/day for 22 years    Types: Cigarettes    Quit date: 12/22/2012  .  Smokeless tobacco: None  . Alcohol Use: No     Comment: x 1 week  . Drug Use: No  . Sexually Active: None   Other Topics Concern  . None   Social History Narrative  . None    Review of Systems: as per HPI  Objective:  Physical Exam: Filed Vitals:   05/25/13 1612  BP: 155/78  Pulse: 83  Temp: 97.6 F (36.4 C)  TempSrc: Oral  Height: 5\' 7"  (1.702 m)  Weight: 250 lb 3.2 oz (113.49 kg)  SpO2: 93%   General: Not in acute distress HEENT: PERRL, EOMI, no scleral icterus, No bruit. It is difficult to assess JVD due to body habitus. Cardiac: S1/S2, RRR, has 1/6 systolic murmurs, No gallops or rubs Pulm: Good air movement bilaterally, Clear to auscultation bilaterally, No rales, wheezing, rhonchi or rubs. Abd: Soft,   nondistended, nontender, no rebound pain, no organomegaly, BS present Ext: No rashes. No any edema in legs, 2+DP/PT pulse bilaterally. There is no tenderness or swelling over calf areas bilaterally. Musculoskeletal: No joint deformities, erythema, or stiffness, ROM full and nontender Skin: no rashes. No skin bruise. Neuro: Alert and oriented X3, cranial nerves II-XII grossly intact, muscle strength 5/5 in all extremeties,  sensation to light touch intact.   Psych: patient is not psychotic, no suicidal or hemocidal ideation.   Assessment & Plan:   Addendum: Patient's BMP showed Cre increased from normal on 03/26/13 to 1.47 on 05/26/13. It is mostly likely due to the combination of two factors: 1) increased Lasix dose from 80 daily to 80 mg bid, and 2) increased dose of lisinopril from 20 to 40 mg daily recently.  Plan:  Will hold lisinopril temporarily from now. He will have a follow up appointment in 2 weeks. Will re-check his BMP then. His bp was elevated when I sam him in clinic yesterday (155/78 mmHg). Will treat him temporarily with Amlodipine 10 mg  daily until his next visit in 2 weeks. I called patient to have informed this change. He verbally understood and agreed to do so.  Lorretta Harp, MD PGY2, Internal Medicine Teaching Service Pager: [phone]

## 2013-05-26 ENCOUNTER — Other Ambulatory Visit: Payer: Self-pay | Admitting: Internal Medicine

## 2013-05-26 DIAGNOSIS — I10 Essential (primary) hypertension: Secondary | ICD-10-CM

## 2013-05-26 LAB — BASIC METABOLIC PANEL WITH GFR
CO2: 32 mEq/L (ref 19–32)
Calcium: 9.9 mg/dL (ref 8.4–10.5)
Creat: 1.47 mg/dL — ABNORMAL HIGH (ref 0.50–1.35)
Glucose, Bld: 68 mg/dL — ABNORMAL LOW (ref 70–99)

## 2013-05-26 MED ORDER — AMLODIPINE BESYLATE 10 MG PO TABS: 10.0000 mg | ORAL_TABLET | Freq: Every day | ORAL | Status: DC

## 2013-05-28 NOTE — Progress Notes (Signed)
TEACHING ATTENDING ADDENDUM: I discussed this case with Dr. Clyde Lundborg at the time of the patient visit. I agree with the HPI, exam findings and have read the documentation provided by the resident,  and I concur with the plan of care. Please see the resident note for details of management.

## 2013-06-11 ENCOUNTER — Ambulatory Visit (INDEPENDENT_AMBULATORY_CARE_PROVIDER_SITE_OTHER): Payer: 59 | Admitting: Internal Medicine

## 2013-06-11 ENCOUNTER — Encounter: Payer: Self-pay | Admitting: Internal Medicine

## 2013-06-11 VITALS — BP 127/74 | HR 82 | Temp 97.6°F | Ht 67.0 in | Wt 246.8 lb

## 2013-06-11 DIAGNOSIS — I5022 Chronic systolic (congestive) heart failure: Secondary | ICD-10-CM

## 2013-06-11 DIAGNOSIS — I509 Heart failure, unspecified: Secondary | ICD-10-CM

## 2013-06-11 LAB — BASIC METABOLIC PANEL WITH GFR
BUN: 11 mg/dL (ref 6–23)
Calcium: 9.7 mg/dL (ref 8.4–10.5)
GFR, Est African American: >89 mL/min
Glucose, Bld: 122 mg/dL — ABNORMAL HIGH (ref 70–99)
Sodium: 137 mEq/L (ref 135–145)

## 2013-06-11 LAB — MAGNESIUM: Magnesium: 2 mg/dL (ref 1.5–2.5)

## 2013-06-11 MED ORDER — CARVEDILOL 25 MG PO TABS: 25.0000 mg | ORAL_TABLET | Freq: Two times a day (BID) | ORAL | Status: DC

## 2013-06-11 NOTE — Progress Notes (Signed)
Subjective:   Patient ID: Colin Walton male   DOB: November 30, 1971 42 y.o.   MRN: [id number]  CC:  Follow up visit.       HPI:  [name] is a 42 y.o.  man with PMH of HTN, OSA, and CHF, who presents for a followup visit today.  Patient was diagnosed with congestive heart failure 12/22/12. A 2-D echo on 12/23/12 showed EF 40-45%, with bilateral atrial enlargement (R>L), and elevated pulmo artery pressure (39mm Hg). Patient was evaluated at cardiology office on 04/21/13 and was noted to have a 10 pound weight gain. Patient was advised to increase Lasix to 80 mg twice a day as well as lisinopril 40 mg daily.  Subsequently, he was followed by his PCP Dr. Clyde Lundborg on 05/25/13.  His Cr was noted to be elevated at 1.47. His lisinopril was discontinued, Coreg dose was increased to 25 BID, and Amlodipine was added temporarily for BP control.  He is here for follow up. He states that he feels fine, and denies any discomfort.    Past Medical History  Diagnosis Date  . Hypertension   . Sleep apnea 12/24/12    Sleep study February 2014  . Cardiomyopathy   . Chronic systolic CHF (congestive heart failure), NYHA class 3    Current Outpatient Prescriptions  Medication Sig Dispense Refill  . amLODipine (NORVASC) 10 MG tablet Take 1 tablet (10 mg total) by mouth daily.  30 tablet  0  . aspirin EC 81 MG EC tablet Take 1 tablet (81 mg total) by mouth daily.  30 tablet  5  . carvedilol (COREG) 25 MG tablet Take 1 tablet (25 mg total) by mouth 2 (two) times daily with a meal.  60 tablet  2  . furosemide (LASIX) 80 MG tablet Take 1 tablet (80 mg total) by mouth 2 (two) times daily.  60 tablet  6  . lisinopril (PRINIVIL,ZESTRIL) 40 MG tablet Take 1 tablet (40 mg total) by mouth daily.  30 tablet  6   No current facility-administered medications for this visit.   Family History  Problem Relation Age of Onset  . Arrhythmia Maternal Grandfather     Atrial fibrillation  . CAD Neg Hx    History   Social  History  . Marital Status: Single    Spouse Name: N/A    Number of Children: 3  . Years of Education: N/A   Occupational History  . Works with furniture    Social History Main Topics  . Smoking status: Former Smoker -- 1.00 packs/day for 22 years    Types: Cigarettes    Quit date: 12/22/2012  . Smokeless tobacco: None  . Alcohol Use: No     Comment: x 1 week  . Drug Use: No  . Sexually Active: None   Other Topics Concern  . None   Social History Narrative  . None    Review of Systems: Review of Systems:  Constitutional:  Denies fever, chills, diaphoresis, appetite change and fatigue.   HEENT:  Denies congestion, sore throat, rhinorrhea, sneezing, mouth sores, trouble swallowing, neck pain   Respiratory:  Denies SOB, DOE, cough, and wheezing.   Cardiovascular:  Denies palpitations and leg swelling.   Gastrointestinal:  Denies nausea, vomiting, abdominal pain, diarrhea, constipation, blood in stool and abdominal distention.   Genitourinary:  Denies dysuria, urgency, frequency, hematuria, flank pain and difficulty urinating.   Musculoskeletal:  Denies myalgias, back pain, joint swelling, arthralgias and gait problem.   Skin:  Denies pallor, rash and wound.   Neurological:  Denies dizziness, seizures, syncope, weakness, light-headedness, numbness and headaches.    .    Objective:  Physical Exam: Filed Vitals:   06/11/13 1109  BP: 127/74  Pulse: 82  Temp: 97.6 F (36.4 C)  TempSrc: Oral  Height: 5\' 7"  (1.702 m)  Weight: 246 lb 12.8 oz (111.948 kg)  SpO2: 100%    General: alert, well-developed, and cooperative to examination.  Head: normocephalic and atraumatic.  Eyes: vision grossly intact, pupils equal, pupils round, pupils reactive to light, no injection and anicteric.  Mouth: pharynx pink and moist, no erythema, and no exudates.  Neck: supple, full ROM, no thyromegaly, no JVD, and no carotid bruits.  Lungs: normal respiratory effort, no accessory muscle use,  normal breath sounds, no crackles, and no wheezes. Heart: normal rate, regular rhythm, no murmur, no gallop, and no rub.  Abdomen: soft, non-tender, normal bowel sounds, no distention, no guarding, no rebound tenderness, no hepatomegaly, and no splenomegaly.  Msk: no joint swelling, no joint warmth, and no redness over joints.  Pulses: 2+ DP/PT pulses bilaterally Extremities: No cyanosis, clubbing, edema Neurologic: alert & oriented X3, cranial nerves II-XII intact, strength normal in all extremities, sensation intact to light touch, and gait normal.  Skin: turgor normal and no rashes.  Psych: Oriented X3, memory intact for recent and remote, normally interactive, good eye contact, not anxious appearing, and not depressed appearing.    Assessment & Plan:

## 2013-06-11 NOTE — Assessment & Plan Note (Addendum)
Patient is asymptomatic.  Weight down 4 pounds since last visit 2 weeks ago. Physical examination is unremarkable.  No signs/symptoms of fluid overload.  -Patient is euvolemic.  - continue his current diuretic regimen. - will check his BMP today - follow up in 2 weeks to determine whether he indeed needs long-term Lasix 80 BID.  - need to repeat BMP again during next office visit - may resume Lisinopril and stop amlodipine if his BMI is stablized next office visit in 2 weeks.

## 2013-06-11 NOTE — Patient Instructions (Addendum)
1. Will check your BMP and Mg 2. Will need follow up in 2 weeks 3. Will follow up with Dr. Clyde Lundborg in 1-2 months

## 2013-06-14 NOTE — Progress Notes (Signed)
Case discussed with Dr. Li soon after the resident saw the patient. We reviewed the resident's history and exam and pertinent patient test results. I agree with the assessment, diagnosis, and plan of care documented in the resident's note. 

## 2013-07-02 ENCOUNTER — Other Ambulatory Visit: Payer: Self-pay | Admitting: Internal Medicine

## 2013-07-02 DIAGNOSIS — I10 Essential (primary) hypertension: Secondary | ICD-10-CM

## 2013-07-20 ENCOUNTER — Ambulatory Visit: Payer: 59 | Admitting: Cardiovascular Disease

## 2013-08-05 ENCOUNTER — Ambulatory Visit (INDEPENDENT_AMBULATORY_CARE_PROVIDER_SITE_OTHER): Payer: 59 | Admitting: Cardiovascular Disease

## 2013-08-05 ENCOUNTER — Encounter: Payer: Self-pay | Admitting: Cardiovascular Disease

## 2013-08-05 VITALS — BP 148/90 | HR 80 | Ht 67.0 in | Wt 242.0 lb

## 2013-08-05 DIAGNOSIS — I10 Essential (primary) hypertension: Secondary | ICD-10-CM

## 2013-08-05 DIAGNOSIS — I509 Heart failure, unspecified: Secondary | ICD-10-CM

## 2013-08-05 DIAGNOSIS — I428 Other cardiomyopathies: Secondary | ICD-10-CM

## 2013-08-05 DIAGNOSIS — I5022 Chronic systolic (congestive) heart failure: Secondary | ICD-10-CM

## 2013-08-05 DIAGNOSIS — I351 Nonrheumatic aortic (valve) insufficiency: Secondary | ICD-10-CM

## 2013-08-05 DIAGNOSIS — I359 Nonrheumatic aortic valve disorder, unspecified: Secondary | ICD-10-CM

## 2013-08-05 MED ORDER — LISINOPRIL 40 MG PO TABS: 40.0000 mg | ORAL_TABLET | Freq: Every day | ORAL | Status: DC

## 2013-08-05 MED ORDER — FUROSEMIDE 80 MG PO TABS: 80.0000 mg | ORAL_TABLET | Freq: Two times a day (BID) | ORAL | Status: DC

## 2013-08-05 MED ORDER — CARVEDILOL 25 MG PO TABS: 25.0000 mg | ORAL_TABLET | Freq: Two times a day (BID) | ORAL | Status: DC

## 2013-08-05 NOTE — Progress Notes (Signed)
History of Present Illness: 42 yo male with history of HTN, OSA, cardiomyopathy who is here today for cardiac follow up. He was seen as a new patient 03/26/13 for further evaluation of his cardiomyopathy. He has been seen in the IM residents clinic. Initially admitted to [facility] 12/22/12 with volume overload and acute CHF. He was diuresed. Echo 12/23/12 with LVEF 40-45%. He was discharged home on Lasix and Coreg. Sleep study February 2014 with severe OSA. He has been drinking 4-6 beers per day for years. He has had uncontrolled HTN for years. I performed a right and left heart cath on 04/02/13 with no evidence of CAD.  He is here today for follow up. He tells me today that he feels great. No chest pain, SOB, orthopnea, PND. Weight down 8 lbs over 2 months. Taking all meds.   Primary Care Physician: Lorretta Harp ([facility])  Last Lipid Profile:Lipid Panel     Component Value Date/Time   CHOL 146 12/23/2012 0515   TRIG 196* 12/23/2012 0515   HDL 34* 12/23/2012 0515   CHOLHDL 4.3 12/23/2012 0515   VLDL 39 12/23/2012 0515   LDLCALC 73 12/23/2012 0515     Past Medical History  Diagnosis Date  . Hypertension   . Sleep apnea 12/24/12    Sleep study February 2014  . Cardiomyopathy   . Chronic systolic CHF (congestive heart failure), NYHA class 3     Past Surgical History  Procedure Laterality Date  . Vasectomy      Current Outpatient Prescriptions  Medication Sig Dispense Refill  . aspirin EC 81 MG EC tablet Take 1 tablet (81 mg total) by mouth daily.  30 tablet  5  . carvedilol (COREG) 25 MG tablet Take 1 tablet (25 mg total) by mouth 2 (two) times daily with a meal.  60 tablet  2  . furosemide (LASIX) 80 MG tablet Take 1 tablet (80 mg total) by mouth 2 (two) times daily.  60 tablet  6  . lisinopril (PRINIVIL,ZESTRIL) 40 MG tablet Take 1 tablet (40 mg total) by mouth daily.  30 tablet  6  . amLODipine (NORVASC) 10 MG tablet TAKE ONE TABLET BY MOUTH ONCE DAILY  90 tablet  3   No  current facility-administered medications for this visit.    No Known Allergies  History   Social History  . Marital Status: Single    Spouse Name: N/A    Number of Children: 3  . Years of Education: N/A   Occupational History  . Works with furniture    Social History Main Topics  . Smoking status: Former Smoker -- 1.00 packs/day for 22 years    Types: Cigarettes    Quit date: 12/22/2012  . Smokeless tobacco: Not on file  . Alcohol Use: No     Comment: x 1 week  . Drug Use: No  . Sexual Activity: Not on file   Other Topics Concern  . Not on file   Social History Narrative  . No narrative on file    Family History  Problem Relation Age of Onset  . Arrhythmia Maternal Grandfather     Atrial fibrillation  . CAD Neg Hx     Review of Systems:  As stated in the HPI and otherwise negative.   BP 148/90  Pulse 80  Ht 5\' 7"  (1.702 m)  Wt 242 lb (109.77 kg)  BMI 37.89 kg/m2  SpO2 97%  Physical Examination: General: Well developed, well nourished, NAD HEENT:  OP clear, mucus membranes moist SKIN: warm, dry. No rashes. Neuro: No focal deficits Musculoskeletal: Muscle strength 5/5 all ext Psychiatric: Mood and affect normal Neck: No JVD, no carotid bruits, no thyromegaly, no lymphadenopathy. Lungs:Clear bilaterally, no wheezes, rhonci, crackles Cardiovascular: Regular rate and rhythm. Diastolic murmur noted. No gallops or rubs. Abdomen:Soft. Bowel sounds present. Non-tender.  Extremities: No lower extremity edema. Pulses are 2 + in the bilateral DP/PT.  Assessment and Plan:   1. Non-ischemic Cardiomyopathy: Likely due to etoh abuse or HTN. Continue medical therapy. Repeat echo now.   2. Acute on chronic systolic CHF: Continue Coreg, Lisinopril. Will continue Lasix 80 mg po BID. Weight stable.    3. Etoh abuse: He has stopped drinking.   4. Aortic valve insufficiency: Mild by echo January 2014. Will repeat echo to assess as murmur seems louder.

## 2013-08-05 NOTE — Patient Instructions (Addendum)
Your physician wants you to follow-up in: 6 MONTHS with Dr Clifton James.  You will receive a reminder letter in the mail two months in advance. If you don't receive a letter, please call our office to schedule the follow-up appointment.  Your physician has requested that you have an echocardiogram within the next month. Echocardiography is a painless test that uses sound waves to create images of your heart. It provides your doctor with information about the size and shape of your heart and how well your heart's chambers and valves are working. This procedure takes approximately one hour. There are no restrictions for this procedure.  Your physician recommends that you continue on your current medications as directed. Please refer to the Current Medication list given to you today.

## 2013-08-27 ENCOUNTER — Other Ambulatory Visit: Payer: Self-pay

## 2013-08-27 DIAGNOSIS — I509 Heart failure, unspecified: Secondary | ICD-10-CM

## 2013-08-27 DIAGNOSIS — I5022 Chronic systolic (congestive) heart failure: Secondary | ICD-10-CM

## 2013-08-27 MED ORDER — LISINOPRIL 40 MG PO TABS: 40.0000 mg | ORAL_TABLET | Freq: Every day | ORAL | Status: DC

## 2013-08-27 MED ORDER — CARVEDILOL 25 MG PO TABS: 25.0000 mg | ORAL_TABLET | Freq: Two times a day (BID) | ORAL | Status: DC

## 2013-08-27 MED ORDER — FUROSEMIDE 80 MG PO TABS: 80.0000 mg | ORAL_TABLET | Freq: Two times a day (BID) | ORAL | Status: DC

## 2013-09-03 ENCOUNTER — Ambulatory Visit (HOSPITAL_COMMUNITY): Payer: 59 | Attending: Cardiovascular Disease | Admitting: Radiology

## 2013-09-03 ENCOUNTER — Other Ambulatory Visit (HOSPITAL_COMMUNITY): Payer: 59

## 2013-09-03 DIAGNOSIS — I428 Other cardiomyopathies: Secondary | ICD-10-CM

## 2013-09-03 DIAGNOSIS — I10 Essential (primary) hypertension: Secondary | ICD-10-CM | POA: Insufficient documentation

## 2013-09-03 DIAGNOSIS — I359 Nonrheumatic aortic valve disorder, unspecified: Secondary | ICD-10-CM | POA: Insufficient documentation

## 2013-09-03 DIAGNOSIS — Z87891 Personal history of nicotine dependence: Secondary | ICD-10-CM | POA: Insufficient documentation

## 2013-09-03 DIAGNOSIS — I351 Nonrheumatic aortic (valve) insufficiency: Secondary | ICD-10-CM

## 2013-09-03 DIAGNOSIS — I319 Disease of pericardium, unspecified: Secondary | ICD-10-CM | POA: Insufficient documentation

## 2013-09-03 DIAGNOSIS — G4733 Obstructive sleep apnea (adult) (pediatric): Secondary | ICD-10-CM | POA: Insufficient documentation

## 2013-09-03 DIAGNOSIS — I5020 Unspecified systolic (congestive) heart failure: Secondary | ICD-10-CM | POA: Insufficient documentation

## 2013-09-03 NOTE — Progress Notes (Signed)
Echocardiogram performed.  

## 2013-09-15 ENCOUNTER — Ambulatory Visit: Payer: 59 | Admitting: Cardiovascular Disease

## 2013-09-23 ENCOUNTER — Telehealth: Payer: Self-pay | Admitting: Cardiovascular Disease

## 2013-09-23 MED ORDER — LOSARTAN POTASSIUM 100 MG PO TABS: 100.0000 mg | ORAL_TABLET | Freq: Every day | ORAL | Status: DC

## 2013-09-23 NOTE — Telephone Encounter (Signed)
Called patient back. He is willing to take Cozaar 100mg  every day. Will call back if he has any problems.

## 2013-09-23 NOTE — Telephone Encounter (Signed)
He needs to have an Ace-inh or ARB on board with LV dysfunction and HTN. Can we check and see if he would be willing to take Cozaar 100 mg po Qdaily instead of Lisinopril? Thanks, chris

## 2013-09-23 NOTE — Telephone Encounter (Signed)
New Problem   Pt stop taking Lisinopril because he believes it was the cause of the sharp pains in his legs. He states that he has stopped taking it for 3 days and the pain has seised. He is asking for an alternative medication if its even needed. Please call back to advise.

## 2013-09-23 NOTE — Telephone Encounter (Signed)
Spoke with patient who states he stopped taking Lisinopril Sunday or Monday due to pain or cramping in his leg, particularly on one side and in the area of the thigh.  Patient states since he has stopped taking the Lisinopril he has not had any pain.  I reviewed patient's medications with him and he verified that he is taking Lasix 80 mg BID and is not taking a potassium supplement.  Patient does not have his blood pressure readings with him at work but states he has been checking it regularly and it has been "fine."  Patient denies SOB, chest discomfort or any other symptoms.  I advised patient that I will send message to Dr. Clifton James for advice and will call patient back.  Patient verbalized understanding and agreement with plan.

## 2013-10-28 ENCOUNTER — Other Ambulatory Visit: Payer: Self-pay

## 2013-12-27 ENCOUNTER — Telehealth: Payer: Self-pay | Admitting: Cardiovascular Disease

## 2013-12-27 NOTE — Telephone Encounter (Signed)
Chart reviewed and I do not see pt scheduled for any procedures. I placed call to pt and left message to call back

## 2013-12-27 NOTE — Telephone Encounter (Signed)
Patient is returning your call. Please call back.  °

## 2013-12-27 NOTE — Telephone Encounter (Signed)
New message     Pt thinks he has a procedure scheduled for 12-27-13 at 7:30.   Is this true and what is the procedure?

## 2013-12-28 NOTE — Telephone Encounter (Signed)
New Problem:  Pt is requesting a call back from [facility] in reference to his upcoming Cath procedure.

## 2013-12-28 NOTE — Telephone Encounter (Signed)
Spoke with pt who reports he received automated message from our office that he had an appointment on December 27, 2013 at 7:30. He thinks this is for a catheterization. Chart reviewed and I do not see any appointments scheduled for pt. He reports he is doing well and not having any problems.  I told him he is scheduled for an appointment with Dr. Clifton James on February 25, 2014 at 9:45.  He will call us if he has problems prior to this appointment.

## 2014-01-28 ENCOUNTER — Encounter: Payer: Self-pay | Admitting: Internal Medicine

## 2014-01-31 ENCOUNTER — Other Ambulatory Visit: Payer: Self-pay | Admitting: *Deleted

## 2014-01-31 MED ORDER — LOSARTAN POTASSIUM 100 MG PO TABS: 100.0000 mg | ORAL_TABLET | Freq: Every day | ORAL | Status: DC

## 2014-02-18 ENCOUNTER — Ambulatory Visit: Payer: 59 | Admitting: Cardiovascular Disease

## 2014-02-18 ENCOUNTER — Encounter: Payer: Self-pay | Admitting: Cardiovascular Disease

## 2014-02-18 ENCOUNTER — Ambulatory Visit (INDEPENDENT_AMBULATORY_CARE_PROVIDER_SITE_OTHER): Payer: 59 | Admitting: Cardiovascular Disease

## 2014-02-18 VITALS — BP 166/79 | HR 74 | Ht 67.0 in | Wt 245.4 lb

## 2014-02-18 DIAGNOSIS — I428 Other cardiomyopathies: Secondary | ICD-10-CM

## 2014-02-18 DIAGNOSIS — I359 Nonrheumatic aortic valve disorder, unspecified: Secondary | ICD-10-CM

## 2014-02-18 DIAGNOSIS — I509 Heart failure, unspecified: Secondary | ICD-10-CM

## 2014-02-18 DIAGNOSIS — I351 Nonrheumatic aortic (valve) insufficiency: Secondary | ICD-10-CM

## 2014-02-18 DIAGNOSIS — I5022 Chronic systolic (congestive) heart failure: Secondary | ICD-10-CM

## 2014-02-18 DIAGNOSIS — N529 Male erectile dysfunction, unspecified: Secondary | ICD-10-CM

## 2014-02-18 DIAGNOSIS — I10 Essential (primary) hypertension: Secondary | ICD-10-CM

## 2014-02-18 MED ORDER — AMLODIPINE BESYLATE 10 MG PO TABS: 10.0000 mg | ORAL_TABLET | Freq: Every day | ORAL | Status: DC

## 2014-02-18 MED ORDER — SILDENAFIL CITRATE 50 MG PO TABS: 50.0000 mg | ORAL_TABLET | Freq: Every day | ORAL | Status: DC | PRN

## 2014-02-18 NOTE — Patient Instructions (Signed)
Your physician wants you to follow-up in:  12 months.  You will receive a reminder letter in the mail two months in advance. If you don't receive a letter, please call our office to schedule the follow-up appointment.  Your physician has recommended you make the following change in your medication: Stop Coreg (carvedilol).  Start amlodipine 10 mg by mouth daily

## 2014-02-18 NOTE — Progress Notes (Signed)
History of Present Illness: 43 yo male with history of HTN, OSA, cardiomyopathy who is here today for cardiac follow up. He was seen as a new patient 03/26/13 for further evaluation of his cardiomyopathy. He has been seen in the IM residents clinic. Initially admitted to [facility] 12/22/12 with volume overload and acute CHF. He was diuresed. Echo 12/23/12 with LVEF 40-45%. He was discharged home on Lasix and Coreg. Sleep study February 2014 with severe OSA. He has been drinking 4-6 beers per day for years. He has had uncontrolled HTN for years. I performed a right and left heart cath on 04/02/13 with no evidence of CAD.  He is here today for follow up. He tells me today that he feels great. No chest pain, SOB, orthopnea, PND. Weight down 8 lbs over 2 months. Taking all meds.   Primary Care Physician: Lorretta Harpiu, [place] ([facility])  Last Lipid Profile:Lipid Panel     Component Value Date/Time   CHOL 146 12/23/2012 0515   TRIG 196* 12/23/2012 0515   HDL 34* 12/23/2012 0515   CHOLHDL 4.3 12/23/2012 0515   VLDL 39 12/23/2012 0515   LDLCALC 73 12/23/2012 0515     Past Medical History  Diagnosis Date  . Hypertension   . Sleep apnea 12/24/12    Sleep study February 2014  . Cardiomyopathy   . Chronic systolic CHF (congestive heart failure), NYHA class 3     Past Surgical History  Procedure Laterality Date  . Vasectomy      Current Outpatient Prescriptions  Medication Sig Dispense Refill  . aspirin EC 81 MG EC tablet Take 1 tablet (81 mg total) by mouth daily.  30 tablet  5  . carvedilol (COREG) 25 MG tablet Take 1 tablet (25 mg total) by mouth 2 (two) times daily with a meal.  180 tablet  3  . furosemide (LASIX) 80 MG tablet Take 1 tablet (80 mg total) by mouth 2 (two) times daily.  180 tablet  3  . losartan (COZAAR) 100 MG tablet Take 1 tablet (100 mg total) by mouth daily.  90 tablet  1   No current facility-administered medications for this visit.    No Known Allergies  History    Social History  . Marital Status: Single    Spouse Name: N/A    Number of Children: 3  . Years of Education: N/A   Occupational History  . Works with furniture    Social History Main Topics  . Smoking status: Former Smoker -- 1.00 packs/day for 22 years    Types: Cigarettes    Quit date: 12/22/2012  . Smokeless tobacco: Not on file  . Alcohol Use: No     Comment: x 1 week  . Drug Use: No  . Sexual Activity: Not on file   Other Topics Concern  . Not on file   Social History Narrative  . No narrative on file    Family History  Problem Relation Age of Onset  . Arrhythmia Maternal Grandfather     Atrial fibrillation  . CAD Neg Hx     Review of Systems:  As stated in the HPI and otherwise negative.   BP 166/79  Pulse 74  Ht 5\' 7"  (1.702 m)  Wt 245 lb 6.4 oz (111.313 kg)  BMI 38.43 kg/m2  Physical Examination: General: Well developed, well nourished, NAD HEENT: OP clear, mucus membranes moist SKIN: warm, dry. No rashes. Neuro: No focal deficits Musculoskeletal: Muscle strength 5/5 all ext  Psychiatric: Mood and affect normal Neck: No JVD, no carotid bruits, no thyromegaly, no lymphadenopathy. Lungs:Clear bilaterally, no wheezes, rhonci, crackles Cardiovascular: Regular rate and rhythm. Diastolic murmur noted. No gallops or rubs. Abdomen:Soft. Bowel sounds present. Non-tender.  Extremities: No lower extremity edema. Pulses are 2 + in the bilateral DP/PT.  EKG: NSR, rate 74 bpm.   Echo 09/03/13: Left ventricle: The cavity size was mildly dilated. Wall thickness was increased in a pattern of mild LVH. Systolic function was normal. The estimated ejection fraction was in the range of 60% to 65%. Features are consistent with a pseudonormal left ventricular filling pattern, with concomitant abnormal relaxation and increased filling pressure (grade 2 diastolic dysfunction).  Assessment and Plan:   1. Non-ischemic Cardiomyopathy: Likely due to etoh abuse or HTN.  Continue medical therapy. LV function normal by echo 9/14.   2. Acute on chronic systolic CHF: Will stop Coreg as he thinks this is contributing to his impotence. LV function is normal. Will start Norvasc 10 mg daily and continue Lisinopril. Will continue Lasix 80 mg po BID. Weight stable.    3. Etoh abuse: He has stopped drinking.   4. Aortic valve insufficiency: Mild by echo 9/14.  5. Impotence: may be related to beta blocker. Will stop Coreg and start Norvasc for BP control. Viagra prn.

## 2014-02-25 ENCOUNTER — Ambulatory Visit: Payer: 59 | Admitting: Cardiovascular Disease

## 2014-07-01 ENCOUNTER — Other Ambulatory Visit: Payer: Self-pay | Admitting: *Deleted

## 2014-07-01 MED ORDER — LOSARTAN POTASSIUM 100 MG PO TABS: 100.0000 mg | ORAL_TABLET | Freq: Every day | ORAL | Status: DC

## 2014-07-01 MED ORDER — AMLODIPINE BESYLATE 10 MG PO TABS: 10.0000 mg | ORAL_TABLET | Freq: Every day | ORAL | Status: DC

## 2014-07-01 MED ORDER — FUROSEMIDE 80 MG PO TABS: 80.0000 mg | ORAL_TABLET | Freq: Two times a day (BID) | ORAL | Status: DC

## 2014-07-01 MED ORDER — AMLODIPINE BESYLATE 10 MG PO TABS
10.0000 mg | ORAL_TABLET | Freq: Every day | ORAL | Status: DC
Start: 2014-07-01 — End: 2014-07-14

## 2014-07-14 ENCOUNTER — Other Ambulatory Visit: Payer: Self-pay

## 2014-07-14 MED ORDER — FUROSEMIDE 80 MG PO TABS: 80.0000 mg | ORAL_TABLET | Freq: Two times a day (BID) | ORAL | Status: DC

## 2014-07-14 MED ORDER — LOSARTAN POTASSIUM 100 MG PO TABS: 100.0000 mg | ORAL_TABLET | Freq: Every day | ORAL | Status: DC

## 2014-07-14 MED ORDER — AMLODIPINE BESYLATE 10 MG PO TABS: 10.0000 mg | ORAL_TABLET | Freq: Every day | ORAL | Status: DC

## 2014-09-09 ENCOUNTER — Encounter: Payer: Self-pay | Admitting: Cardiovascular Disease

## 2014-09-09 ENCOUNTER — Ambulatory Visit (INDEPENDENT_AMBULATORY_CARE_PROVIDER_SITE_OTHER): Payer: BC Managed Care – PPO | Admitting: Cardiovascular Disease

## 2014-09-09 VITALS — BP 138/80 | HR 92 | Ht 67.0 in | Wt 237.4 lb

## 2014-09-09 DIAGNOSIS — I5022 Chronic systolic (congestive) heart failure: Secondary | ICD-10-CM

## 2014-09-09 DIAGNOSIS — I509 Heart failure, unspecified: Secondary | ICD-10-CM

## 2014-09-09 DIAGNOSIS — N529 Male erectile dysfunction, unspecified: Secondary | ICD-10-CM

## 2014-09-09 DIAGNOSIS — I351 Nonrheumatic aortic (valve) insufficiency: Secondary | ICD-10-CM

## 2014-09-09 DIAGNOSIS — I359 Nonrheumatic aortic valve disorder, unspecified: Secondary | ICD-10-CM

## 2014-09-09 DIAGNOSIS — I428 Other cardiomyopathies: Secondary | ICD-10-CM

## 2014-09-09 LAB — BASIC METABOLIC PANEL
BUN: 13 mg/dL (ref 6–23)
CHLORIDE: 101 meq/L (ref 96–112)
CO2: 26 meq/L (ref 19–32)
Calcium: 9.7 mg/dL (ref 8.4–10.5)
Creatinine, Ser: 1.3 mg/dL (ref 0.4–1.5)
GFR: 81.36 mL/min (ref >60.00)
Glucose, Bld: 113 mg/dL — ABNORMAL HIGH (ref 70–99)
POTASSIUM: 3.7 meq/L (ref 3.5–5.1)
SODIUM: 137 meq/L (ref 135–145)

## 2014-09-09 MED ORDER — TADALAFIL 10 MG PO TABS: 10.0000 mg | ORAL_TABLET | Freq: Every day | ORAL | Status: DC | PRN

## 2014-09-09 NOTE — Patient Instructions (Addendum)
Your physician wants you to follow-up in:  12 months.  You will receive a reminder letter in the mail two months in advance. If you don't receive a letter, please call our office to schedule the follow-up appointment.  Your physician has recommended you make the following change in your medication: Stop aspirin   

## 2014-09-09 NOTE — Progress Notes (Signed)
History of Present Illness: 43 yo male with history of HTN, OSA, cardiomyopathy who is here today for cardiac follow up. He was seen as a new patient 03/26/13 for evaluation of his cardiomyopathy. Initially admitted to [facility] 12/22/12 with volume overload and acute CHF. He was diuresed. Echo 12/23/12 with LVEF 40-45%. He was discharged home on Lasix and Coreg. Sleep study February 2014 with severe OSA. He had been drinking 4-6 beers per day for years. He has had uncontrolled HTN for years. I performed a right and left heart cath on 04/02/13 with no evidence of CAD.  He is here today for follow up. He tells me today that he feels great. No chest pain, SOB, orthopnea, PND. No LE edema. Weight stable. Could not afford viagra.   Primary Care Physician: Rich Number  Last Lipid Profile:Lipid Panel     Component Value Date/Time   CHOL 146 12/23/2012 0515   TRIG 196* 12/23/2012 0515   HDL 34* 12/23/2012 0515   CHOLHDL 4.3 12/23/2012 0515   VLDL 39 12/23/2012 0515   LDLCALC 73 12/23/2012 0515     Past Medical History  Diagnosis Date  . Hypertension   . Sleep apnea 12/24/12    Sleep study February 2014  . Cardiomyopathy   . Chronic systolic CHF (congestive heart failure), NYHA class 3     Past Surgical History  Procedure Laterality Date  . Vasectomy      Current Outpatient Prescriptions  Medication Sig Dispense Refill  . amLODipine (NORVASC) 10 MG tablet Take 1 tablet (10 mg total) by mouth daily.  90 tablet  1  . aspirin EC 81 MG EC tablet Take 1 tablet (81 mg total) by mouth daily.  30 tablet  5  . furosemide (LASIX) 80 MG tablet Take 1 tablet (80 mg total) by mouth 2 (two) times daily.  180 tablet  1  . losartan (COZAAR) 100 MG tablet Take 1 tablet (100 mg total) by mouth daily.  90 tablet  1   No current facility-administered medications for this visit.    No Known Allergies  History   Social History  . Marital Status: Single    Spouse Name: N/A    Number of Children: 3  .  Years of Education: N/A   Occupational History  . Works with furniture    Social History Main Topics  . Smoking status: Former Smoker -- 1.00 packs/day for 22 years    Types: Cigarettes    Quit date: 12/22/2012  . Smokeless tobacco: Not on file  . Alcohol Use: No     Comment: x 1 week  . Drug Use: No  . Sexual Activity: Not on file   Other Topics Concern  . Not on file   Social History Narrative  . No narrative on file    Family History  Problem Relation Age of Onset  . Arrhythmia Maternal Grandfather     Atrial fibrillation  . CAD Neg Hx     Review of Systems:  As stated in the HPI and otherwise negative.   BP 138/80  Pulse 92  Ht 5\' 7"  (1.702 m)  Wt 237 lb 6.4 oz (107.684 kg)  BMI 37.17 kg/m2  Physical Examination: General: Well developed, well nourished, NAD HEENT: OP clear, mucus membranes moist SKIN: warm, dry. No rashes. Neuro: No focal deficits Musculoskeletal: Muscle strength 5/5 all ext Psychiatric: Mood and affect normal Neck: No JVD, no carotid bruits, no thyromegaly, no lymphadenopathy. Lungs:Clear bilaterally, no wheezes,  rhonci, crackles Cardiovascular: Regular rate and rhythm. Diastolic murmur noted. No gallops or rubs. Abdomen:Soft. Bowel sounds present. Non-tender.  Extremities: No lower extremity edema. Pulses are 2 + in the bilateral DP/PT.  Echo 09/03/13: Left ventricle: The cavity size was mildly dilated. Wall thickness was increased in a pattern of mild LVH. Systolic function was normal. The estimated ejection fraction was in the range of 60% to 65%. Features are consistent with a pseudonormal left ventricular filling pattern, with concomitant abnormal relaxation and increased filling pressure (grade 2 diastolic dysfunction).  Assessment and Plan:   1. Non-ischemic Cardiomyopathy: Likely due to etoh abuse or HTN. Continue medical therapy. LV function normal by echo 9/14. Stop ASA.   2. Chronic systolic CHF: He is not on Coreg due to  sexual dysfunction. LV function is normal. Continue Norvasc and Cozaar. Will continue Lasix 80 mg po BID. Weight stable.    3. Etoh abuse: He has stopped drinking.   4. Aortic valve insufficiency: Mild by echo 9/14.  5. Impotence: He cannot afford Viagra. Will try Cialis. Free voucher printed.

## 2014-09-12 ENCOUNTER — Encounter: Payer: Self-pay | Admitting: *Deleted

## 2014-09-14 ENCOUNTER — Telehealth: Payer: Self-pay | Admitting: *Deleted

## 2014-09-14 NOTE — Telephone Encounter (Signed)
Spoke with pt and reviewed recent BMP results with him.

## 2014-10-07 ENCOUNTER — Other Ambulatory Visit: Payer: Self-pay

## 2014-12-23 DIAGNOSIS — I340 Nonrheumatic mitral (valve) insufficiency: Secondary | ICD-10-CM

## 2014-12-23 HISTORY — DX: Nonrheumatic mitral (valve) insufficiency: I34.0

## 2015-01-04 ENCOUNTER — Other Ambulatory Visit: Payer: Self-pay | Admitting: *Deleted

## 2015-01-04 MED ORDER — AMLODIPINE BESYLATE 10 MG PO TABS: 10.0000 mg | ORAL_TABLET | Freq: Every day | ORAL | Status: DC

## 2015-01-04 MED ORDER — LOSARTAN POTASSIUM 100 MG PO TABS: 100.0000 mg | ORAL_TABLET | Freq: Every day | ORAL | Status: DC

## 2015-01-04 MED ORDER — FUROSEMIDE 80 MG PO TABS: 80.0000 mg | ORAL_TABLET | Freq: Two times a day (BID) | ORAL | Status: DC

## 2015-12-07 ENCOUNTER — Ambulatory Visit
Admission: RE | Admit: 2015-12-07 | Discharge: 2015-12-07 | Disposition: A | Discharge status: Home / Self Care | Payer: 59 | Source: Ambulatory Visit | Attending: Physician Assistant | Admitting: Physician Assistant

## 2015-12-07 ENCOUNTER — Ambulatory Visit (INDEPENDENT_AMBULATORY_CARE_PROVIDER_SITE_OTHER): Payer: 59

## 2015-12-07 ENCOUNTER — Ambulatory Visit (INDEPENDENT_AMBULATORY_CARE_PROVIDER_SITE_OTHER): Payer: 59 | Admitting: Physician Assistant

## 2015-12-07 ENCOUNTER — Telehealth: Payer: Self-pay

## 2015-12-07 VITALS — BP 158/88 | HR 116 | Temp 99.5°F | Ht 66.75 in | Wt 230.0 lb

## 2015-12-07 DIAGNOSIS — R9389 Abnormal findings on diagnostic imaging of other specified body structures: Secondary | ICD-10-CM

## 2015-12-07 DIAGNOSIS — I509 Heart failure, unspecified: Secondary | ICD-10-CM

## 2015-12-07 DIAGNOSIS — R058 Other specified cough: Secondary | ICD-10-CM

## 2015-12-07 DIAGNOSIS — R938 Abnormal findings on diagnostic imaging of other specified body structures: Secondary | ICD-10-CM

## 2015-12-07 DIAGNOSIS — R05 Cough: Secondary | ICD-10-CM | POA: Diagnosis not present

## 2015-12-07 DIAGNOSIS — R0600 Dyspnea, unspecified: Secondary | ICD-10-CM | POA: Diagnosis not present

## 2015-12-07 LAB — POCT CBC
Granulocyte percent: 62.1 %G (ref 37–80)
HEMATOCRIT: 48.5 % (ref 43.5–53.7)
HEMOGLOBIN: 16.7 g/dL (ref 14.1–18.1)
LYMPH, POC: 2.4 (ref 0.6–3.4)
MCH, POC: 30.3 pg (ref 27–31.2)
MCHC: 34.4 g/dL (ref 31.8–35.4)
MCV: 88.1 fL (ref 80–97)
MID (CBC): 0.8 (ref 0–0.9)
MPV: 7.5 fL (ref 0–99.8)
POC Granulocyte: 5.3 (ref 2–6.9)
POC LYMPH %: 28.5 % (ref 10–50)
POC MID %: 9.4 % (ref 0–12)
Platelet Count, POC: 202 10*3/uL (ref 142–424)
RBC: 5.5 M/uL (ref 4.69–6.13)
RDW, POC: 14.1 %
WBC: 8.5 10*3/uL (ref 4.6–10.2)

## 2015-12-07 MED ORDER — ALBUTEROL SULFATE HFA 108 (90 BASE) MCG/ACT IN AERS: 2.0000 | INHALATION_SPRAY | RESPIRATORY_TRACT | Status: DC | PRN

## 2015-12-07 MED ORDER — AZITHROMYCIN 250 MG PO TABS: ORAL_TABLET | ORAL | Status: DC

## 2015-12-07 MED ORDER — BENZONATATE 100 MG PO CAPS: 100.0000 mg | ORAL_CAPSULE | Freq: Three times a day (TID) | ORAL | Status: DC | PRN

## 2015-12-07 NOTE — Telephone Encounter (Signed)
Discussed ct image with patient.  Advised that we need to re-image in 3-6 months.  He voiced understanding.

## 2015-12-07 NOTE — Telephone Encounter (Signed)
Can you review his CT results?

## 2015-12-07 NOTE — Progress Notes (Signed)
[facility] [address] [place] [address] [phone]  Date:  12/07/2015   Name:  Colin Walton   DOB:  28-Aug-1971   MRN:  [id number]  PCP:  No primary care provider on file.    History of Present Illness:  Colin Walton is a 44 y.o. male patient who presents to [facility] for cc of cough and dyspnea.   --2 weeks,  Started as mild cough with some productive sputum.  He thought  cough--worse with laying down.  Coughing mucus yellow/green.  Some dyspnea when laying.  Laying up, or sides.  No fever or chills.  No sorethroat.  Some nasal congestion.  He has taken mucinex, emergen-c.  No one sick around him.   No chest pains or leg swellings.  He is taking his anti-hypertensives. Smoking.   etOH no etOH use.  7 days 1/2 fifth.     Patient Active Problem List   Diagnosis Date Noted  . Alcohol abuse 04/24/2013  . Essential hypertension, benign 04/24/2013  . CHF (congestive heart failure) (HCC) 01/18/2013  . Bleeding nose 01/05/2013  . OSA (obstructive sleep apnea) 01/05/2013  . Health care maintenance 01/05/2013    Past Medical History  Diagnosis Date  . Hypertension   . Sleep apnea 12/24/12    Sleep study February 2014  . Cardiomyopathy (HCC)   . Chronic systolic CHF (congestive heart failure), NYHA class 3 (HCC)     Past Surgical History  Procedure Laterality Date  . Vasectomy      Social History  Substance Use Topics  . Smoking status: Light Tobacco Smoker -- 0.05 packs/day for 22 years    Types: Cigarettes  . Smokeless tobacco: Never Used  . Alcohol Use: No     Comment: x 1 week    Family History  Problem Relation Age of Onset  . Arrhythmia Maternal Grandfather     Atrial fibrillation  . CAD Neg Hx     No Known Allergies  Medication list has been reviewed and updated.  Current Outpatient Prescriptions on File Prior to Visit  Medication Sig Dispense Refill  . amLODipine (NORVASC) 10 MG tablet Take 1 tablet (10 mg total) by  mouth daily. 90 tablet 1  . furosemide (LASIX) 80 MG tablet Take 1 tablet (80 mg total) by mouth 2 (two) times daily. 180 tablet 1  . losartan (COZAAR) 100 MG tablet Take 1 tablet (100 mg total) by mouth daily. 90 tablet 1  . tadalafil (CIALIS) 10 MG tablet Take 1 tablet (10 mg total) by mouth daily as needed for erectile dysfunction. (Patient not taking: Reported on 12/07/2015) 10 tablet 2   No current facility-administered medications on file prior to visit.    ROS ROS otherwise unremarkable unless listed above.   Physical Examination: BP 158/88 mmHg  Pulse 116  Temp(Src) 99.5 F (37.5 C) (Oral)  Ht 5' 6.75" (1.695 m)  Wt 230 lb (104.327 kg)  BMI 36.31 kg/m2  SpO2 93% Ideal Body Weight: Weight in (lb) to have BMI = 25: 158.1  Physical Exam  Constitutional: He is oriented to person, place, and time. He appears well-developed and well-nourished. No distress.  HENT:  Head: Atraumatic.  Right Ear: Tympanic membrane, external ear and ear canal normal.  Left Ear: Tympanic membrane, external ear and ear canal normal.  Nose: Mucosal edema and rhinorrhea present. Right sinus exhibits no maxillary sinus tenderness and no frontal sinus tenderness. Left sinus exhibits no maxillary sinus tenderness and  no frontal sinus tenderness.  Mouth/Throat: No uvula swelling. No oropharyngeal exudate, posterior oropharyngeal edema or posterior oropharyngeal erythema.  Eyes: Conjunctivae, EOM and lids are normal. Pupils are equal, round, and reactive to light. Right eye exhibits normal extraocular motion. Left eye exhibits normal extraocular motion.  Neck: Trachea normal and full passive range of motion without pain. No edema and no erythema present.  Cardiovascular: Normal rate and regular rhythm.   Pulses:      Radial pulses are 2+ on the right side, and 2+ on the left side.       Dorsalis pedis pulses are 2+ on the right side, and 2+ on the left side.  Pulmonary/Chest: Effort normal. No respiratory  distress. He has no decreased breath sounds. He has wheezes. He has rhonchi.  Musculoskeletal: He exhibits no edema.  Neurological: He is alert and oriented to person, place, and time.  Skin: Skin is warm and dry. He is not diaphoretic.  Psychiatric: He has a normal mood and affect. His behavior is normal.    Results for orders placed or performed in visit on 12/07/15  POCT CBC  Result Value Ref Range   WBC 8.5 4.6 - 10.2 K/uL   Lymph, poc 2.4 0.6 - 3.4   POC LYMPH PERCENT 28.5 10 - 50 %L   MID (cbc) 0.8 0 - 0.9   POC MID % 9.4 0 - 12 %M   POC Granulocyte 5.3 2 - 6.9   Granulocyte percent 62.1 37 - 80 %G   RBC 5.50 4.69 - 6.13 M/uL   Hemoglobin 16.7 14.1 - 18.1 g/dL   HCT, POC 92.4 46.2 - 53.7 %   MCV 88.1 80 - 97 fL   MCH, POC 30.3 27 - 31.2 pg   MCHC 34.4 31.8 - 35.4 g/dL   RDW, POC 86.3 %   Platelet Count, POC 202 142 - 424 K/uL   MPV 7.5 0 - 99.8 fL   [facility] reading (PRIMARY) by  Dr. Alwyn Ren: Cardiomegaly with possible right hilar mass  Assessment and Plan: Colin Walton is a 44 y.o. male who is here today for cc of cough and dyspnea.  CT performed today.  I have advised that he see pulmonologist for possible change and copd.  Advised smoking cessation and alcohol cessation. -starting azithromycin at this time.   -at this time, cardiology consult is advised. -hepatic function test ordered  Dyspnea - Plan: POCT CBC, azithromycin (ZITHROMAX) 250 MG tablet, Ambulatory referral to Cardiology, CT Chest Wo Contrast, benzonatate (TESSALON) 100 MG capsule, albuterol (PROVENTIL HFA;VENTOLIN HFA) 108 (90 BASE) MCG/ACT inhaler, CANCELED: COMPLETE METABOLIC PANEL WITH GFR  Productive cough - Plan: DG Chest 2 View, POCT CBC, azithromycin (ZITHROMAX) 250 MG tablet, Ambulatory referral to Cardiology, CT Chest Wo Contrast, albuterol (PROVENTIL HFA;VENTOLIN HFA) 108 (90 BASE) MCG/ACT inhaler, CANCELED: COMPLETE METABOLIC PANEL WITH GFR  Congestive heart failure, unspecified congestive  heart failure chronicity, unspecified congestive heart failure type (HCC) - Plan: Ambulatory referral to Cardiology, CT Chest Wo Contrast, benzonatate (TESSALON) 100 MG capsule  Abnormal CXR (chest x-ray) - Plan: CT Chest Wo Contrast  Abnormal finding on CT scan - Plan: Hepatic Function Panel   Trena Platt, PA-C [facility] 12/07/2015 8:39 AM

## 2015-12-07 NOTE — Patient Instructions (Addendum)
Please await contact for your appointment with your cardiologist.  I want you to see him within the next week.   I am giving you prescription of the azithromycin.  We will get this filled too.   Please head to [facility] [address] [place], [place] now.  I will contact you with the results.

## 2015-12-07 NOTE — Telephone Encounter (Signed)
Pt called requesting the results from his CT scan today.

## 2015-12-13 ENCOUNTER — Encounter: Payer: Self-pay | Admitting: Physician Assistant

## 2015-12-14 ENCOUNTER — Encounter: Payer: Self-pay | Admitting: Cardiovascular Disease

## 2015-12-15 ENCOUNTER — Encounter: Payer: Self-pay | Admitting: Nurse Practitioner

## 2015-12-15 ENCOUNTER — Ambulatory Visit (INDEPENDENT_AMBULATORY_CARE_PROVIDER_SITE_OTHER): Payer: 59 | Admitting: Nurse Practitioner

## 2015-12-15 VITALS — BP 180/80 | HR 105 | Ht 66.5 in | Wt 239.8 lb

## 2015-12-15 DIAGNOSIS — I429 Cardiomyopathy, unspecified: Secondary | ICD-10-CM | POA: Diagnosis not present

## 2015-12-15 DIAGNOSIS — N529 Male erectile dysfunction, unspecified: Secondary | ICD-10-CM | POA: Diagnosis not present

## 2015-12-15 DIAGNOSIS — I351 Nonrheumatic aortic (valve) insufficiency: Secondary | ICD-10-CM | POA: Diagnosis not present

## 2015-12-15 DIAGNOSIS — I10 Essential (primary) hypertension: Secondary | ICD-10-CM

## 2015-12-15 DIAGNOSIS — I428 Other cardiomyopathies: Secondary | ICD-10-CM

## 2015-12-15 DIAGNOSIS — I5022 Chronic systolic (congestive) heart failure: Secondary | ICD-10-CM

## 2015-12-15 LAB — HEPATIC FUNCTION PANEL
ALT: 24 U/L (ref 9–46)
AST: 21 U/L (ref 10–40)
Albumin: 3.6 g/dL (ref 3.6–5.1)
Alkaline Phosphatase: 46 U/L (ref 40–115)
Bilirubin, Direct: 0.1 mg/dL (ref <=0.2)
Indirect Bilirubin: 0.3 mg/dL (ref 0.2–1.2)
Total Bilirubin: 0.4 mg/dL (ref 0.2–1.2)
Total Protein: 6.6 g/dL (ref 6.1–8.1)

## 2015-12-15 LAB — CBC
HCT: 47.2 % (ref 39.0–52.0)
Hemoglobin: 16 g/dL (ref 13.0–17.0)
MCH: 29.7 pg (ref 26.0–34.0)
MCHC: 33.9 g/dL (ref 30.0–36.0)
MCV: 87.6 fL (ref 78.0–100.0)
MPV: 9.6 fL (ref 8.6–12.4)
Platelets: 281 10*3/uL (ref 150–400)
RBC: 5.39 MIL/uL (ref 4.22–5.81)
RDW: 13.6 % (ref 11.5–15.5)
WBC: 6.2 10*3/uL (ref 4.0–10.5)

## 2015-12-15 LAB — BASIC METABOLIC PANEL
BUN: 12 mg/dL (ref 7–25)
CO2: 27 mmol/L (ref 20–31)
Calcium: 9.1 mg/dL (ref 8.6–10.3)
Chloride: 104 mmol/L (ref 98–110)
Creat: 1.21 mg/dL (ref 0.60–1.35)
Glucose, Bld: 115 mg/dL — ABNORMAL HIGH (ref 65–99)
Potassium: 3.5 mmol/L (ref 3.5–5.3)
Sodium: 142 mmol/L (ref 135–146)

## 2015-12-15 LAB — BRAIN NATRIURETIC PEPTIDE: Brain Natriuretic Peptide: 225.5 pg/mL — ABNORMAL HIGH (ref 0.0–100.0)

## 2015-12-15 MED ORDER — HYDRALAZINE HCL 50 MG PO TABS: 50.0000 mg | ORAL_TABLET | Freq: Two times a day (BID) | ORAL | Status: DC

## 2015-12-15 NOTE — Progress Notes (Signed)
CARDIOLOGY OFFICE NOTE  Date:  12/15/2015    Colin Walton Date of Birth: 30-Jan-1971 Medical Record #[id number]  PCP:  No primary care provider on file.  Cardiologist:  Colin Walton    Chief Complaint  Patient presents with  . Cardiomyopathy    Work in visit - seen for Dr. Clifton Walton  . Shortness of Breath  . Edema    History of Present Illness: Colin Walton is a 44 y.o. male who presents today for a work in visit. Seen for Dr. Clifton Walton.   He has not been seen here since September of 2015.   He has a history of history of HTN, OSA, and nonischemic cardiomyopathy. Initially admitted to [facility] 12/22/12 with volume overload and acute CHF. He was diuresed. Echo 12/23/12 with LVEF 40-45%. He was discharged home on Lasix and Coreg. Sleep study February 2014 with severe OSA. He has long standing alcohol and tobacco abuse.  He has had uncontrolled HTN for years. He has had a right and left heart cath on 04/02/13 with no evidence of CAD.  Comes in today. Here with his mother. Says he went to [facility] due to wheezing and cough - told to "stop drinking and stop smoking". He had had a cough for about 2 weeks - was given antibiotics. One episode of hemoptysis - none recent. He notes he is better from this standpoint. He is short of breath with activity. No symptoms at rest. No swelling. Weight is fairly stable. BP sky high. Continues to have both alcohol and tobacco abuse. Has had a CT of the chest - lymph nodes noted - emphysema and cirrhosis as well. These results were reviewed with him. Says he does not get too much salt - girlfriend does the cooking. No real eating out.   Past Medical History  Diagnosis Date  . Hypertension   . Sleep apnea 12/24/12    Sleep study February 2014  . Cardiomyopathy (HCC)   . Chronic systolic CHF (congestive heart failure), NYHA class 3 (HCC)     Past Surgical History  Procedure Laterality Date  . Vasectomy       Medications: Current  Outpatient Prescriptions  Medication Sig Dispense Refill  . albuterol (PROVENTIL HFA;VENTOLIN HFA) 108 (90 BASE) MCG/ACT inhaler Inhale 2 puffs into the lungs every 4 (four) hours as needed for wheezing or shortness of breath (cough, shortness of breath or wheezing.). 1 Inhaler 1  . amLODipine (NORVASC) 10 MG tablet Take 1 tablet (10 mg total) by mouth daily. 90 tablet 1  . benzonatate (TESSALON) 100 MG capsule Take 1-2 capsules (100-200 mg total) by mouth 3 (three) times daily as needed for cough. 40 capsule 0  . furosemide (LASIX) 80 MG tablet Take 1 tablet (80 mg total) by mouth 2 (two) times daily. 180 tablet 1  . losartan (COZAAR) 100 MG tablet Take 1 tablet (100 mg total) by mouth daily. 90 tablet 1  . tadalafil (CIALIS) 10 MG tablet Take 1 tablet (10 mg total) by mouth daily as needed for erectile dysfunction. 10 tablet 2  . hydrALAZINE (APRESOLINE) 50 MG tablet Take 1 tablet (50 mg total) by mouth 2 (two) times daily. 60 tablet 3   No current facility-administered medications for this visit.    Allergies: No Known Allergies  Social History: The patient  reports that he has been smoking Cigarettes.  He has a 1.1 pack-year smoking history. He has never used smokeless tobacco. He reports that he does not  drink alcohol or use illicit drugs.   Family History: The patient's family history includes Arrhythmia in his maternal grandfather. There is no history of CAD.   Review of Systems: Please see the history of present illness.   Otherwise, the review of systems is positive for none.   All other systems are reviewed and negative.   Physical Exam: VS:  BP 180/80 mmHg  Pulse 105  Ht 5' 6.5" (1.689 m)  Wt 239 lb 12.8 oz (108.773 kg)  BMI 38.13 kg/m2 .  BMI Body mass index is 38.13 kg/(m^2).  Wt Readings from Last 3 Encounters:  12/15/15 239 lb 12.8 oz (108.773 kg)  12/07/15 230 lb (104.327 kg)  09/09/14 237 lb 6.4 oz (107.684 kg)    General: Alert. He looks older than his stated  age. He is in no acute distress.  HEENT: Normal. Neck: Supple, no JVD, carotid bruits, or masses noted.  Cardiac: Regular rate and rhythm. He has a + gallop noted. No edema.  Respiratory:  Lungs are coarse with normal work of breathing.  GI: Soft and nontender.  MS: No deformity or atrophy. Gait and ROM intact. Skin: Warm and dry. Color is normal.  Neuro:  Strength and sensation are intact and no gross focal deficits noted.  Psych: Alert, appropriate and with normal affect.   LABORATORY DATA:  EKG:  EKG is ordered today. This demonstrates sinus tachycardia.  Lab Results  Component Value Date   WBC 8.5 12/07/2015   HGB 16.7 12/07/2015   HCT 48.5 12/07/2015   PLT 281.0 03/26/2013   GLUCOSE 113* 09/09/2014   CHOL 146 12/23/2012   TRIG 196* 12/23/2012   HDL 34* 12/23/2012   LDLCALC 73 12/23/2012   ALT 69* 12/22/2012   AST 91* 12/22/2012   NA 137 09/09/2014   K 3.7 09/09/2014   CL 101 09/09/2014   CREATININE 1.3 09/09/2014   BUN 13 09/09/2014   CO2 26 09/09/2014   TSH 1.674 12/22/2012   INR 0.9 03/26/2013   HGBA1C 6.2* 12/22/2012    BNP (last 3 results) No results for input(s): BNP in the last 8760 hours.  ProBNP (last 3 results) No results for input(s): PROBNP in the last 8760 hours.   Other Studies Reviewed Today:  Echo 09/03/13: Left ventricle: The cavity size was mildly dilated. Wall thickness was increased in a pattern of mild LVH. Systolic function was normal. The estimated ejection fraction was in the range of 60% to 65%. Features are consistent with a pseudonormal left ventricular filling pattern, with concomitant abnormal relaxation and increased filling pressure (grade 2 diastolic dysfunction).   CT CHEST IMPRESSION 11/2015: When correlating with prior chest radiograph, the prominent perihilar opacity may have reflected prominent pulmonary vasculature.  Moderate paraseptal emphysematous changes in the bilateral upper lobes. No suspicious pulmonary  nodules.  Prominent mediastinal, right hilar, and infrahilar lymph nodes, measuring up to 1.5 cm short axis, possibly reactive. Follow-up CT chest is suggested in 3-6 months.  Cardiomegaly. No frank interstitial edema.  Possible mild cirrhosis. Superimposed hepatic steatosis.   Electronically Signed  By: Charline Bills M.D.  On: 12/07/2015 14:31   Assessment and Plan:   1. Non-ischemic Cardiomyopathy: Likely due to ETOH abuse and/or HTN. Discussed at length about the need to stop drinking/smoking. His prognosis is quite poor if he is not able to make life style changes - this was also discussed with him at length. Encouraged to go to AA if needed.   2. Chronic systolic CHF: He is not  on Coreg due to sexual dysfunction. Having more shortness of breath. Will update his echo. Has gallop on exam, BP not controlled.  3. Etoh abuse: Advised to stop all alcohol - he has cirrhosis already noted on the CT.   4. Aortic valve insufficiency: Mild by echo 9/14. Updating  5. Recent URI - says cough is better but continues with DOE.  6.HTN - uncontrolled - adding Hydralazine 50 mg BID today (he is not able to take medicines more than twice a day and even this schedule may be difficult for him to follow).   7. Multisubstance abuse - discussed at length today.   Current medicines are reviewed with the patient today.  The patient does not have concerns regarding medicines other than what has been noted above.  The following changes have been made:  See above.  Labs/ tests ordered today include:    Orders Placed This Encounter  Procedures  . Basic metabolic panel  . CBC  . Brain natriuretic peptide  . Hepatic function panel  . EKG 12-Lead  . ECHOCARDIOGRAM COMPLETE     Disposition:   FU with Dr. Clifton Walton in January as planned.   Patient is agreeable to this plan and will call if any problems develop in the interim.   Signed: Rosalio Macadamia, RN, ANP-C 12/15/2015  10:58 AM  [facility] [address] [place], [place]  [address] Phone: [phone] Fax: [phone]

## 2015-12-15 NOTE — Patient Instructions (Addendum)
We will be checking the following labs today - BMET, BNP, HPF & CBC    Medication Instructions:    Continue with your current medicines. BUT  I am adding Hydralazine 50 mg to take twice a day    Testing/Procedures To Be Arranged:  Echocardiogram  Follow-Up:   See  Dr. Clifton James as planned.    Other Special Instructions:   Think about what we talked about today.     If you need a refill on your cardiac medications before your next appointment, please call your pharmacy.   Call the [facility] office at [phone] if you have any questions, problems or concerns.

## 2015-12-16 ENCOUNTER — Encounter: Payer: Self-pay | Admitting: Physician Assistant

## 2015-12-21 ENCOUNTER — Ambulatory Visit (HOSPITAL_COMMUNITY): Payer: 59 | Attending: Cardiovascular Disease

## 2015-12-21 ENCOUNTER — Other Ambulatory Visit: Payer: Self-pay

## 2015-12-21 DIAGNOSIS — I10 Essential (primary) hypertension: Secondary | ICD-10-CM | POA: Diagnosis not present

## 2015-12-21 DIAGNOSIS — I517 Cardiomegaly: Secondary | ICD-10-CM | POA: Diagnosis not present

## 2015-12-21 DIAGNOSIS — I5022 Chronic systolic (congestive) heart failure: Secondary | ICD-10-CM | POA: Insufficient documentation

## 2015-12-21 DIAGNOSIS — I340 Nonrheumatic mitral (valve) insufficiency: Secondary | ICD-10-CM | POA: Diagnosis not present

## 2015-12-21 DIAGNOSIS — I351 Nonrheumatic aortic (valve) insufficiency: Secondary | ICD-10-CM | POA: Diagnosis not present

## 2015-12-21 DIAGNOSIS — N529 Male erectile dysfunction, unspecified: Secondary | ICD-10-CM | POA: Diagnosis not present

## 2015-12-21 DIAGNOSIS — I509 Heart failure, unspecified: Secondary | ICD-10-CM | POA: Diagnosis present

## 2015-12-21 DIAGNOSIS — I428 Other cardiomyopathies: Secondary | ICD-10-CM

## 2015-12-21 DIAGNOSIS — I429 Cardiomyopathy, unspecified: Secondary | ICD-10-CM | POA: Insufficient documentation

## 2015-12-21 DIAGNOSIS — F17200 Nicotine dependence, unspecified, uncomplicated: Secondary | ICD-10-CM | POA: Insufficient documentation

## 2016-01-01 ENCOUNTER — Ambulatory Visit: Payer: Self-pay | Admitting: Cardiovascular Disease

## 2016-01-03 ENCOUNTER — Encounter: Payer: Self-pay | Admitting: Cardiovascular Disease

## 2016-01-03 ENCOUNTER — Ambulatory Visit (INDEPENDENT_AMBULATORY_CARE_PROVIDER_SITE_OTHER): Payer: 59 | Admitting: Cardiovascular Disease

## 2016-01-03 VITALS — BP 140/70 | HR 107 | Wt 232.0 lb

## 2016-01-03 DIAGNOSIS — I429 Cardiomyopathy, unspecified: Secondary | ICD-10-CM

## 2016-01-03 DIAGNOSIS — I10 Essential (primary) hypertension: Secondary | ICD-10-CM | POA: Diagnosis not present

## 2016-01-03 DIAGNOSIS — I5022 Chronic systolic (congestive) heart failure: Secondary | ICD-10-CM

## 2016-01-03 DIAGNOSIS — I428 Other cardiomyopathies: Secondary | ICD-10-CM

## 2016-01-03 DIAGNOSIS — I340 Nonrheumatic mitral (valve) insufficiency: Secondary | ICD-10-CM

## 2016-01-03 DIAGNOSIS — I351 Nonrheumatic aortic (valve) insufficiency: Secondary | ICD-10-CM

## 2016-01-03 DIAGNOSIS — Z720 Tobacco use: Secondary | ICD-10-CM

## 2016-01-03 MED ORDER — VARENICLINE TARTRATE 0.5 MG X 11 & 1 MG X 42 PO MISC: ORAL | Status: DC

## 2016-01-03 MED ORDER — VARENICLINE TARTRATE 1 MG PO TABS: 1.0000 mg | ORAL_TABLET | Freq: Two times a day (BID) | ORAL | Status: DC

## 2016-01-03 NOTE — Addendum Note (Signed)
Addended byDossie Arbour on: 01/03/2016 02:16 PM   Modules accepted: Medications

## 2016-01-03 NOTE — Progress Notes (Signed)
Chief Complaint  Patient presents with  . Annual Exam     History of Present Illness: 45 yo male with history of HTN, OSA, cardiomyopathy who is here today for cardiac follow up. He was seen as a new patient 03/26/13 for evaluation of his cardiomyopathy. Initially admitted to [facility] 12/22/12 with volume overload and acute CHF. He was diuresed. Echo 12/23/12 with LVEF 40-45%. He was discharged home on Lasix and Coreg. Sleep study February 2014 with severe OSA. He had been drinking 4-6 beers per day for years. He has had uncontrolled HTN for years. I performed a right and left heart cath on 04/02/13 with no evidence of CAD He has not taken Coreg due to sexual dysfunction. Echo 12/21/15 with LVEF=35-40%.   He is here today for follow up. He tells me today that he feels great. No chest pain, SOB, orthopnea, PND. No LE edema. Weight stable. He is still smoking but wishes to stop.   Primary Care Physician: Polite  Past Medical History  Diagnosis Date  . Hypertension   . Sleep apnea 12/24/12    Sleep study February 2014  . Cardiomyopathy (HCC)   . Chronic systolic CHF (congestive heart failure), NYHA class 3 (HCC)     Past Surgical History  Procedure Laterality Date  . Vasectomy      Current Outpatient Prescriptions  Medication Sig Dispense Refill  . amLODipine (NORVASC) 10 MG tablet Take 1 tablet (10 mg total) by mouth daily. 90 tablet 1  . carvedilol (COREG) 6.25 MG tablet Take 6.25 mg by mouth daily.    . furosemide (LASIX) 80 MG tablet Take 1 tablet (80 mg total) by mouth 2 (two) times daily. 180 tablet 1  . hydrALAZINE (APRESOLINE) 50 MG tablet Take 1 tablet (50 mg total) by mouth 2 (two) times daily. 60 tablet 3  . losartan (COZAAR) 50 MG tablet Take 50 mg by mouth daily.    . varenicline (CHANTIX CONTINUING MONTH PAK) 1 MG tablet Take 1 tablet (1 mg total) by mouth 2 (two) times daily. 60 tablet 0  . varenicline (CHANTIX PAK) 0.5 MG X 11 & 1 MG X 42 tablet Follow label  instructions 53 tablet 0   No current facility-administered medications for this visit.    No Known Allergies  Social History   Social History  . Marital Status: Single    Spouse Name: N/A  . Number of Children: 3  . Years of Education: N/A   Occupational History  . Works with furniture    Social History Main Topics  . Smoking status: Light Tobacco Smoker -- 0.05 packs/day for 22 years    Types: Cigarettes  . Smokeless tobacco: Never Used  . Alcohol Use: No     Comment: x 1 week  . Drug Use: No  . Sexual Activity: Not on file   Other Topics Concern  . Not on file   Social History Narrative    Family History  Problem Relation Age of Onset  . Arrhythmia Maternal Grandfather     Atrial fibrillation  . CAD Neg Hx     Review of Systems:  As stated in the HPI and otherwise negative.   BP 140/70 mmHg  Pulse 107  Wt 232 lb (105.235 kg)  SpO2 97%  Physical Examination: General: Well developed, well nourished, NAD HEENT: OP clear, mucus membranes moist SKIN: warm, dry. No rashes. Neuro: No focal deficits Musculoskeletal: Muscle strength 5/5 all ext Psychiatric: Mood and affect normal Neck: No  JVD, no carotid bruits, no thyromegaly, no lymphadenopathy. Lungs:Clear bilaterally, no wheezes, rhonci, crackles Cardiovascular: Regular rate and rhythm. Diastolic murmur noted. No gallops or rubs. Abdomen:Soft. Bowel sounds present. Non-tender.  Extremities: No lower extremity edema. Pulses are 2 + in the bilateral DP/PT.  Echo 12/21/15: Left ventricle: The cavity size was moderately dilated. Wall thickness was normal. Systolic function was moderately reduced. The estimated ejection fraction was in the range of 35% to 40%. Diffuse hypokinesis. - Aortic valve: There was trivial regurgitation. - Mitral valve: There was mild regurgitation. - Left atrium: The atrium was moderately dilated. - Atrial septum: No defect or patent foramen ovale was identified  EKG:  EKG  is not ordered today. The ekg ordered today demonstrates   Recent Labs: 12/15/2015: ALT 24; BUN 12; Creat 1.21; Hemoglobin 16.0; Platelets 281; Potassium 3.5; Sodium 142   Lipid Panel    Component Value Date/Time   CHOL 146 12/23/2012 0515   TRIG 196* 12/23/2012 0515   HDL 34* 12/23/2012 0515   CHOLHDL 4.3 12/23/2012 0515   VLDL 39 12/23/2012 0515   LDLCALC 73 12/23/2012 0515     Wt Readings from Last 3 Encounters:  01/03/16 232 lb (105.235 kg)  12/15/15 239 lb 12.8 oz (108.773 kg)  12/07/15 230 lb (104.327 kg)     Other studies Reviewed: Additional studies/ records that were reviewed today include: . Review of the above records demonstrates:    Assessment and Plan:   1. Non-ischemic Cardiomyopathy: Likely due to ETOH abuse and/or HTN. Discussed at length about the need to stop drinking/smoking. His prognosis is quite poor if he is not able to make life style changes - this was also discussed with him at length. Encouraged to go to AA if needed. Will continue Cozaar. He has also been started on Coreg and tolerating. It had been stopped in the past due to sexual dysfunction.   2. Chronic systolic CHF: Volume status is ok. Continue Lasix. .   3. Etoh abuse: Advised to stop all alcohol - he has cirrhosis already noted on the CT.   4. Aortic valve insufficiency: Mild by echo 12/16.   5. HTN: BP better controlled today.    6. Mitral regurgitation: Mild by echo 12/16.   7. Tobacco abuse: Long discussion today regarding smoking cessation. He is willing to try Chantix to assist with cessation.   Current medicines are reviewed at length with the patient today.  The patient does not have concerns regarding medicines.  The following changes have been made:  no change  Labs/ tests ordered today include:  No orders of the defined types were placed in this encounter.    Disposition:   FU with me in 6 months  Signed, Verne Carrow, MD 01/03/2016 2:14 PM    [facility] [address] [place], [place], [place]  [address] Phone: [phone]; Fax: [phone]

## 2016-01-03 NOTE — Patient Instructions (Addendum)
Medication Instructions:  Your physician has recommended you make the following change in your medication: Start Chantix.  Follow label instructions.    Labwork: none  Testing/Procedures: none  Follow-Up: Your physician wants you to follow-up in: 6 months.  You will receive a reminder letter in the mail two months in advance. If you don't receive a letter, please call our office to schedule the follow-up appointment.  Call us with the dose of Coreg you have been taking.  Any Other Special Instructions Will Be Listed Below (If Applicable).     If you need a refill on your cardiac medications before your next appointment, please call your pharmacy.

## 2016-03-15 ENCOUNTER — Other Ambulatory Visit: Payer: Self-pay | Admitting: *Deleted

## 2016-03-15 MED ORDER — FUROSEMIDE 80 MG PO TABS: 80.0000 mg | ORAL_TABLET | Freq: Two times a day (BID) | ORAL | Status: DC

## 2016-03-15 MED ORDER — AMLODIPINE BESYLATE 10 MG PO TABS: 10.0000 mg | ORAL_TABLET | Freq: Every day | ORAL | Status: DC

## 2016-04-27 ENCOUNTER — Encounter (HOSPITAL_COMMUNITY): Payer: Self-pay | Admitting: Emergency Medicine

## 2016-04-27 ENCOUNTER — Emergency Department (HOSPITAL_COMMUNITY)
Admission: EM | Admit: 2016-04-27 | Discharge: 2016-04-27 | Disposition: A | Discharge status: Home / Self Care | Payer: 59 | Attending: Emergency Medicine | Admitting: Emergency Medicine

## 2016-04-27 ENCOUNTER — Emergency Department (HOSPITAL_COMMUNITY): Payer: 59

## 2016-04-27 DIAGNOSIS — Y939 Activity, unspecified: Secondary | ICD-10-CM | POA: Diagnosis not present

## 2016-04-27 DIAGNOSIS — W25XXXA Contact with sharp glass, initial encounter: Secondary | ICD-10-CM | POA: Diagnosis not present

## 2016-04-27 DIAGNOSIS — I5022 Chronic systolic (congestive) heart failure: Secondary | ICD-10-CM | POA: Diagnosis not present

## 2016-04-27 DIAGNOSIS — I110 Hypertensive heart disease with heart failure: Secondary | ICD-10-CM | POA: Diagnosis not present

## 2016-04-27 DIAGNOSIS — Z792 Long term (current) use of antibiotics: Secondary | ICD-10-CM | POA: Insufficient documentation

## 2016-04-27 DIAGNOSIS — Y929 Unspecified place or not applicable: Secondary | ICD-10-CM | POA: Diagnosis not present

## 2016-04-27 DIAGNOSIS — S51011A Laceration without foreign body of right elbow, initial encounter: Secondary | ICD-10-CM | POA: Insufficient documentation

## 2016-04-27 DIAGNOSIS — F17210 Nicotine dependence, cigarettes, uncomplicated: Secondary | ICD-10-CM | POA: Diagnosis not present

## 2016-04-27 DIAGNOSIS — IMO0002 Reserved for concepts with insufficient information to code with codable children: Secondary | ICD-10-CM

## 2016-04-27 DIAGNOSIS — Y999 Unspecified external cause status: Secondary | ICD-10-CM | POA: Diagnosis not present

## 2016-04-27 DIAGNOSIS — Z79899 Other long term (current) drug therapy: Secondary | ICD-10-CM | POA: Insufficient documentation

## 2016-04-27 MED ORDER — LIDOCAINE HCL 1 % IJ SOLN
INTRAMUSCULAR | Status: AC
  Administered 2016-04-27: 20 mL
  Filled 2016-04-27: qty 20

## 2016-04-27 MED ORDER — CEPHALEXIN 500 MG PO CAPS: 500.0000 mg | ORAL_CAPSULE | Freq: Two times a day (BID) | ORAL | Status: DC

## 2016-04-27 MED ORDER — LIDOCAINE HCL (PF) 1 % IJ SOLN: 5.0000 mL | Freq: Once | INTRAMUSCULAR | Status: DC

## 2016-04-27 NOTE — ED Notes (Signed)
Per pt, states he cut right elbow on some glass

## 2016-04-27 NOTE — ED Provider Notes (Signed)
CSN: [id number]     Arrival date & time 04/27/16  1226 History  By signing my name below, I, Colin Walton, attest that this documentation has been prepared under the direction and in the presence of non-physician practitioner, Audry Pili, PA. Electronically Signed: Marisue Walton, Scribe. 04/27/2016. 1:09 PM.   Chief Complaint  Patient presents with  . Extremity Laceration   The history is provided by the patient. No language interpreter was used.   HPI Comments:  Colin Walton is a 45 y.o. male with PMHx of HTN, CHF and cardiomyopathy who presents to the Emergency Department complaining of 2/10 painful laceration to right elbow ~20 minutes ago. Pt states his right arm hit a glass vase while he was getting into a car and shattered the vase. No alleviating factors noted or treatments attempted PTA. Last Tetanus 1-2 years ago. Denies numbness or arthralgias.  Past Medical History  Diagnosis Date  . Hypertension   . Sleep apnea 12/24/12    Sleep study February 2014  . Cardiomyopathy (HCC)   . Chronic systolic CHF (congestive heart failure), NYHA class 3 (HCC)    Past Surgical History  Procedure Laterality Date  . Vasectomy     Family History  Problem Relation Age of Onset  . Arrhythmia Maternal Grandfather     Atrial fibrillation  . CAD Neg Hx    Social History  Substance Use Topics  . Smoking status: Light Tobacco Smoker -- 0.05 packs/day for 22 years    Types: Cigarettes  . Smokeless tobacco: Never Used  . Alcohol Use: No     Comment: x 1 week    Review of Systems  Musculoskeletal: Negative for arthralgias.  Skin: Positive for wound.  Neurological: Negative for numbness.   Allergies  Review of patient's allergies indicates no known allergies.  Home Medications   Prior to Admission medications   Medication Sig Start Date End Date Taking? Authorizing Provider  amLODipine (NORVASC) 10 MG tablet Take 1 tablet (10 mg total) by mouth daily. 03/15/16   Kathleene Hazel, MD  carvedilol (COREG) 6.25 MG tablet Take 6.25 mg by mouth 2 (two) times daily with a meal.  01/01/16   Historical Provider, MD  cephALEXin (KEFLEX) 500 MG capsule Take 1 capsule (500 mg total) by mouth 2 (two) times daily. 04/27/16   Audry Pili, PA-C  furosemide (LASIX) 80 MG tablet Take 1 tablet (80 mg total) by mouth 2 (two) times daily. 03/15/16   Kathleene Hazel, MD  hydrALAZINE (APRESOLINE) 50 MG tablet Take 1 tablet (50 mg total) by mouth 2 (two) times daily. 12/15/15   Rosalio Macadamia, NP  losartan (COZAAR) 50 MG tablet Take 50 mg by mouth daily. 01/01/16   Historical Provider, MD  varenicline (CHANTIX CONTINUING MONTH PAK) 1 MG tablet Take 1 tablet (1 mg total) by mouth 2 (two) times daily. 01/03/16   Kathleene Hazel, MD  varenicline (CHANTIX PAK) 0.5 MG X 11 & 1 MG X 42 tablet Follow label instructions 01/03/16   Kathleene Hazel, MD   BP 191/84 mmHg  Pulse 115  Temp(Src) 98.2 F (36.8 C) (Oral)  Resp 18  Ht 5\' 7"  (1.702 m)  Wt 230 lb (104.327 kg)  BMI 36.01 kg/m2  SpO2 100%   Physical Exam  Constitutional: He is oriented to person, place, and time. He appears well-developed and well-nourished.  HENT:  Head: Normocephalic and atraumatic.  Eyes: Conjunctivae and EOM are normal. Right eye exhibits no discharge. Left eye  exhibits no discharge.  Neck: Normal range of motion.  Cardiovascular: Normal rate and regular rhythm.   Pulmonary/Chest: Effort normal. No respiratory distress.  Abdominal: Soft.  Musculoskeletal: Normal range of motion.       Right elbow: He exhibits laceration. He exhibits normal range of motion, no swelling, no effusion and no deformity. No tenderness found. No radial head, no medial epicondyle, no lateral epicondyle and no olecranon process tenderness noted.  1.5cm superficial laceration noted on posterior right elbow. Bleeding controlled. No swelling. No erythema. No signs of infection present.   Neurological: He is alert and oriented  to person, place, and time. Coordination normal.  Skin: Skin is warm and dry. No rash noted. He is not diaphoretic. No erythema.  Psychiatric: He has a normal mood and affect. His behavior is normal. Thought content normal.  Nursing note and vitals reviewed.  ED Course  Procedures  DIAGNOSTIC STUDIES:  Oxygen Saturation is 100% on RA, normal by my interpretation.    COORDINATION OF CARE:  1:07 PM Will repair laceration. Recommended follow-up in 8-10 days to remove sutures. Discussed treatment plan with pt at bedside and pt agreed to plan.  LACERATION REPAIR Performed by: Audry Pili, PA Consent: Verbal consent obtained. Risks and benefits: risks, benefits and alternatives were discussed Patient identity confirmed: provided demographic data Time out performed prior to procedure Prepped and Draped in normal sterile fashion Wound explored Laceration Location: Posterior right elbow Laceration Length: 1.5 cm No Foreign Bodies seen or palpated Anesthesia: local infiltration Local anesthetic: lidocaine 1% Anesthetic total: 1 ml Irrigation method: syringe Amount of cleaning: standard Skin closure: simple Number of sutures or staples: #5 Prolene 4-0 Technique: simple Patient tolerance: Patient tolerated the procedure well with no immediate complications.  Labs Review Labs Reviewed - No data to display  Imaging Review Dg Elbow Complete Right  04/27/2016  CLINICAL DATA:  Arm cut by broken glass vase EXAM: RIGHT ELBOW - COMPLETE 3+ VIEW COMPARISON:  None. FINDINGS: Frontal, lateral, and bilateral oblique views were obtained. No fracture or dislocation. No joint effusion. The joint spaces appear normal. There is no appreciable soft tissue air or radiopaque foreign body. IMPRESSION: No demonstrable radiopaque foreign body. No bony abnormality. No appreciable arthropathy. Electronically Signed   By: Bretta Bang III M.D.   On: 04/27/2016 13:43   I have personally reviewed and evaluated  these images and lab results as part of my medical decision-making.   EKG Interpretation None      MDM   I have reviewed and evaluated the relevant imaging studies. I have reviewed the relevant previous healthcare records. I obtained HPI from historian.  ED Course:  Assessment: Tetanus UTD. Laceration occurred < 12 hours prior to repair. Discussed laceration care with pt and answered questions. Pt to f-u for suture removal in 8-10 days and wound check sooner should there be signs of dehiscence or infection. Pt is hemodynamically stable with no complaints prior to dc. Given Rx Keflex.     Disposition/Plan:  DC Home Additional Verbal discharge instructions given and discussed with patient.  Pt Instructed to f/u with PCP in the next week for evaluation and treatment of symptoms. Return precautions given Pt acknowledges and agrees with plan  Supervising Physician Pricilla Loveless, MD   Final diagnoses:  Laceration    I personally performed the services described in this documentation, which was scribed in my presence. The recorded information has been reviewed and is accurate.     Audry Pili, PA-C 04/27/16 1410  Pricilla Loveless, MD 04/27/16 [phone]

## 2016-04-27 NOTE — Discharge Instructions (Signed)
Please read and follow all provided instructions.  Your diagnoses today include:  1. Laceration    Tests performed today include:  X-ray of the affected area that did not show any foreign bodies or broken bones  Vital signs. See below for your results today.   Medications prescribed:   Take any prescribed medications only as directed.   Home care instructions:  Follow any educational materials and wound care instructions contained in this packet.   You may shower and wash the area with soap and water, just be sure to pat the area dry and not rub over the stitches. Do no put your stiches underwater (in a bath, pool, or lake). Getting stiches wet can slow down healing and increase your chances of getting an infection. You may apply Bacitracin or Neosporin twice a day for 7 days, and keep the ara clean with  bandage or gauze. Do not apply alcohol or hydrogen peroxide. Cover the area if it draining or weeping.   Follow-up instructions: Suture Removal: Return to the Emergency Department or see your primary care care doctor in 8-10 days for a recheck of your wound and removal of your sutures or staples.    Return instructions:  Return to the Emergency Department if you have:  Fever  Worsening pain  Worsening swelling of the wound  Pus draining from the wound  Redness of the skin that moves away from the wound, especially if it streaks away from the affected area   Any other emergent concerns  Your vital signs today were: BP 191/84 mmHg   Pulse 115   Temp(Src) 98.2 F (36.8 C) (Oral)   Resp 18   Ht 5\' 7"  (1.702 m)   Wt 104.327 kg   BMI 36.01 kg/m2   SpO2 100% If your blood pressure (BP) was elevated above 135/85 this visit, please have this repeated by your doctor within one month. --------------

## 2016-08-26 ENCOUNTER — Other Ambulatory Visit: Payer: Self-pay | Admitting: Nurse Practitioner

## 2016-11-08 ENCOUNTER — Ambulatory Visit: Payer: 59 | Admitting: Cardiology

## 2016-11-19 ENCOUNTER — Encounter: Payer: Self-pay | Admitting: Cardiology

## 2016-11-19 ENCOUNTER — Ambulatory Visit (INDEPENDENT_AMBULATORY_CARE_PROVIDER_SITE_OTHER): Payer: 59 | Admitting: Cardiology

## 2016-11-19 VITALS — BP 150/82 | HR 89 | Ht 67.0 in | Wt 249.8 lb

## 2016-11-19 DIAGNOSIS — I10 Essential (primary) hypertension: Secondary | ICD-10-CM

## 2016-11-19 DIAGNOSIS — I428 Other cardiomyopathies: Secondary | ICD-10-CM | POA: Diagnosis not present

## 2016-11-19 DIAGNOSIS — I5022 Chronic systolic (congestive) heart failure: Secondary | ICD-10-CM

## 2016-11-19 NOTE — Progress Notes (Signed)
11/19/2016 Colin Walton   08-Sep-1971  [id number]  Primary Physician Katy Apo, MD Primary Cardiologist: Dr. Clifton James    Reason for Visit/CC: F/u for Nonischemic Cardiomyopathy   HPI:  The patient is a 45 year old male followed by Dr. Clifton James, with a history of nonischemic cardiomyopathy, diagnosed in 2014 when he presented to [facility] with volume overload and acute CHF. Echocardiogram in January 2014 revealed a left ventricular ejection fraction of 40-45%. Subsequent right and left heart catheterization showed no evidence of CAD. His most recent echocardiogram was 12/21/2015. This showed an EF of 35-40%. It also showed mild aortic insufficiency and mild mitral regurgitation. He also has a history of alcohol and tobacco abuse, uncontrolled hypertension and severe obstructive sleep apnea.He was last seen in clinic by Dr. Clifton James on 01/03/2016.  He presents back to clinic today for f/u. He reports that he has done well. No problems since his last office visit. He denies dyspnea. No lower extremity edema, orthopnea or PND. He also denies chest pain. He's been fully compliant with his CPAP every night. He is also been fully compliant with his medications, although he reports that he did not take his meds prior to this morning's appointment due to desire to avoid frequent urination. He plans to take his medications when he leaves the office today. He continues to drink alcohol and smoke cigarettes however he reports that he has made significant reduction over recent months. He now only drinks wine. He drinks about 1 1/2 bottles in one week. He is down to 5 cigarettes a day.  Current Meds  Medication Sig  . amLODipine (NORVASC) 10 MG tablet Take 1 tablet (10 mg total) by mouth daily.  . carvedilol (COREG) 6.25 MG tablet Take 6.25 mg by mouth 2 (two) times daily with a meal.   . furosemide (LASIX) 80 MG tablet Take 1 tablet (80 mg total) by mouth 2 (two) times daily.  .  hydrALAZINE (APRESOLINE) 50 MG tablet TAKE ONE TABLET BY MOUTH TWICE DAILY  . losartan (COZAAR) 50 MG tablet Take 50 mg by mouth daily.   No Known Allergies Past Medical History:  Diagnosis Date  . Cardiomyopathy (HCC)   . Chronic systolic CHF (congestive heart failure), NYHA class 3 (HCC)   . Hypertension   . Sleep apnea 12/24/12   Sleep study February 2014   Family History  Problem Relation Age of Onset  . Arrhythmia Maternal Grandfather     Atrial fibrillation  . CAD Neg Hx    Past Surgical History:  Procedure Laterality Date  . VASECTOMY     Social History   Social History  . Marital status: Single    Spouse name: N/A  . Number of children: 3  . Years of education: N/A   Occupational History  . Works with Research [employer], political party   Social History Main Topics  . Smoking status: Light Tobacco Smoker    Packs/day: 0.05    Years: 22.00    Types: Cigarettes  . Smokeless tobacco: Never Used  . Alcohol use No     Comment: x 1 week  . Drug use: No  . Sexual activity: Not on file   Other Topics Concern  . Not on file   Social History Narrative  . No narrative on file     Review of Systems: General: negative for chills, fever, night sweats or weight changes.  Cardiovascular: negative for chest pain, dyspnea on exertion, edema, orthopnea, palpitations, paroxysmal nocturnal dyspnea  or shortness of breath Dermatological: negative for rash Respiratory: negative for cough or wheezing Urologic: negative for hematuria Abdominal: negative for nausea, vomiting, diarrhea, bright red blood per rectum, melena, or hematemesis Neurologic: negative for visual changes, syncope, or dizziness All other systems reviewed and are otherwise negative except as noted above.   Physical Exam:  Blood pressure (!) 150/82, pulse 89, height 5\' 7"  (1.702 m), weight 249 lb 12 oz (113.3 kg).  General appearance: alert, cooperative, no distress and moderately obese Neck: no carotid bruit, no JVD  and thyroid not enlarged, symmetric, no tenderness/mass/nodules Lungs: clear to auscultation bilaterally Heart: regular rate and rhythm, S1, S2 normal, no murmur, click, rub or gallop Extremities: extremities normal, atraumatic, no cyanosis or edema Pulses: 2+ and symmetric Skin: Skin color, texture, turgor normal. No rashes or lesions  EKG NSR. 89 bpm.   ASSESSMENT AND PLAN:   1. Non-ischemic Cardiomyopathy: Likely due to ETOH abuse and/or HTN. He continues to drink and smoke. BP is 150 systolic today but this is prior to meds this morning. He is euvolemic on physical exam. No dyspnea. continue medical therapy with BB, ARB, hydralazine and diuretic. ETOH cessation advised.    2. Chronic systolic CHF: Volume status is stable. No dyspnea. Continue Lasix, BB, ARBand hydralazine. We discussed importance of low sodium diet and daily weights to monitor volume status.   3. Etoh abuse: Advised to stop all alcohol   4. Aortic valve insufficiency: Mild by echo 12/16.   5. HTN: continue Coreg, Losartan, Lasix and amlodipine.   6. Mitral regurgitation: Mild by echo 12/16.   7. Tobacco abuse: continues to smoke but reduced consumption down to 5 cigarettes/day. Complete smoking cessation strongly advised.   8. OSA: compliant with nightly use of CPAP.   PLAN  F/u with Dr. Clifton James in 6 months.   Robbie Lis [facility] 11/19/2016 8:36 AM

## 2016-11-19 NOTE — Patient Instructions (Addendum)
Medication Instructions:  Your physician recommends that you continue on your current medications as directed. Please refer to the Current Medication list given to you today.   Labwork: None ordered  Testing/Procedures: None ordered  Follow-Up: Your physician wants you to follow-up in: 6 MONTHS WITH DR. MCALHANY   You will receive a reminder letter in the mail two months in advance. If you don't receive a letter, please call our office to schedule the follow-up appointment.   Any Other Special Instructions Will Be Listed Below (If Applicable).     If you need a refill on your cardiac medications before your next appointment, please call your pharmacy.   

## 2017-05-01 ENCOUNTER — Encounter: Payer: Self-pay | Admitting: Physician Assistant

## 2017-05-15 ENCOUNTER — Ambulatory Visit: Payer: 59 | Admitting: Physician Assistant

## 2017-06-11 ENCOUNTER — Encounter: Payer: Self-pay | Admitting: Cardiovascular Disease

## 2017-06-30 ENCOUNTER — Ambulatory Visit (INDEPENDENT_AMBULATORY_CARE_PROVIDER_SITE_OTHER): Payer: 59 | Admitting: Cardiovascular Disease

## 2017-06-30 ENCOUNTER — Encounter: Payer: Self-pay | Admitting: Cardiovascular Disease

## 2017-06-30 VITALS — BP 180/82 | HR 102 | Ht 67.0 in | Wt 242.8 lb

## 2017-06-30 DIAGNOSIS — I351 Nonrheumatic aortic (valve) insufficiency: Secondary | ICD-10-CM | POA: Diagnosis not present

## 2017-06-30 DIAGNOSIS — I10 Essential (primary) hypertension: Secondary | ICD-10-CM | POA: Diagnosis not present

## 2017-06-30 DIAGNOSIS — Z720 Tobacco use: Secondary | ICD-10-CM | POA: Diagnosis not present

## 2017-06-30 DIAGNOSIS — I428 Other cardiomyopathies: Secondary | ICD-10-CM

## 2017-06-30 DIAGNOSIS — I5022 Chronic systolic (congestive) heart failure: Secondary | ICD-10-CM | POA: Diagnosis not present

## 2017-06-30 MED ORDER — AMLODIPINE BESYLATE 10 MG PO TABS: 10.0000 mg | ORAL_TABLET | Freq: Every day | ORAL | 11 refills | Status: DC

## 2017-06-30 MED ORDER — LOSARTAN POTASSIUM 50 MG PO TABS: 50.0000 mg | ORAL_TABLET | Freq: Every day | ORAL | 11 refills | Status: DC

## 2017-06-30 MED ORDER — CARVEDILOL 12.5 MG PO TABS: 12.5000 mg | ORAL_TABLET | Freq: Two times a day (BID) | ORAL | 11 refills | Status: DC

## 2017-06-30 MED ORDER — HYDRALAZINE HCL 50 MG PO TABS: 50.0000 mg | ORAL_TABLET | Freq: Two times a day (BID) | ORAL | 11 refills | Status: DC

## 2017-06-30 MED ORDER — ATORVASTATIN CALCIUM 10 MG PO TABS: 10.0000 mg | ORAL_TABLET | Freq: Every day | ORAL | 11 refills | Status: DC

## 2017-06-30 MED ORDER — FUROSEMIDE 80 MG PO TABS: 80.0000 mg | ORAL_TABLET | Freq: Two times a day (BID) | ORAL | 2 refills | Status: DC

## 2017-06-30 NOTE — Patient Instructions (Addendum)
Medication Instructions:  REFILLS HAVE BEEN SENT  INCREASE COREG TO 12.5 MG DAILY  Labwork: NONE ORDERED  Testing/Procedures: NONE ORDERED  Follow-Up: Your physician wants you to follow-up in: 1 YEAR WITH DR. Clifton James You will receive a reminder letter in the mail two months in advance. If you don't receive a letter, please call our office to schedule the follow-up appointment.   Any Other Special Instructions Will Be Listed Below (If Applicable).     If you need a refill on your cardiac medications before your next appointment, please call your pharmacy.

## 2017-06-30 NOTE — Progress Notes (Signed)
Chief Complaint  Patient presents with  . Follow-up    Non-ischemic cardiomyopathy   History of Present Illness: 46 yo male with history of HTN, OSA, cardiomyopathy and moderate alcohol use who is here today for cardiac follow up. He was admitted to [facility] December 2013 with acute CHF and was found to have reduced LV systolic function. He was diuresed and improved quickly. Echo 12/23/12 with LVEF 40-45%. He was discharged home on Lasix and Coreg. Sleep study February 2014 with severe OSA. He had been drinking 4-6 beers per day for years. He has had uncontrolled HTN for years. I performed a right and left heart cath on 04/02/13 with no evidence of CAD. Echo 12/21/15 with LVEF=35-40%.   He is here today for follow up. The patient denies any chest pain, dyspnea, palpitations, lower extremity edema, orthopnea, PND, dizziness, near syncope or syncope.   Primary Care Physician: Renford Dills, MD  Past Medical History:  Diagnosis Date  . Cardiomyopathy (HCC)   . Chronic systolic CHF (congestive heart failure), NYHA class 3 (HCC)   . Hypertension   . Sleep apnea 12/24/12   Sleep study February 2014    Past Surgical History:  Procedure Laterality Date  . VASECTOMY      Current Outpatient Prescriptions  Medication Sig Dispense Refill  . amLODipine (NORVASC) 10 MG tablet Take 1 tablet (10 mg total) by mouth daily. 30 tablet 11  . atorvastatin (LIPITOR) 10 MG tablet Take 1 tablet (10 mg total) by mouth daily. 30 tablet 11  . hydrALAZINE (APRESOLINE) 50 MG tablet Take 1 tablet (50 mg total) by mouth 2 (two) times daily. 60 tablet 11  . losartan (COZAAR) 50 MG tablet Take 1 tablet (50 mg total) by mouth daily. 30 tablet 11  . carvedilol (COREG) 12.5 MG tablet Take 1 tablet (12.5 mg total) by mouth 2 (two) times daily. 60 tablet 11  . furosemide (LASIX) 80 MG tablet Take 1 tablet (80 mg total) by mouth 2 (two) times daily. 180 tablet 2   No current facility-administered medications for this visit.       No Known Allergies  Social History   Social History  . Marital status: Single    Spouse name: N/A  . Number of children: 3  . Years of education: N/A   Occupational History  . Works with [employer]   Social History Main Topics  . Smoking status: Light Tobacco Smoker    Packs/day: 0.05    Years: 22.00    Types: Cigarettes  . Smokeless tobacco: Never Used  . Alcohol use No     Comment: x 1 week  . Drug use: No  . Sexual activity: Not on file   Other Topics Concern  . Not on file   Social History Narrative  . No narrative on file    Family History  Problem Relation Age of Onset  . Arrhythmia Maternal Grandfather        Atrial fibrillation  . CAD Neg Hx     Review of Systems:  As stated in the HPI and otherwise negative.   BP (!) 180/82   Pulse (!) 102   Ht 5\' 7"  (1.702 m)   Wt 242 lb 12.8 oz (110.1 kg)   SpO2 98%   BMI 38.03 kg/m   Physical Examination:  General: Well developed, well nourished, NAD  HEENT: OP clear, mucus membranes moist  SKIN: warm, dry. No rashes. Neuro: No focal deficits  Musculoskeletal: Muscle strength 5/5  all ext  Psychiatric: Mood and affect normal  Neck: No JVD, no carotid bruits, no thyromegaly, no lymphadenopathy.  Lungs:Clear bilaterally, no wheezes, rhonci, crackles Cardiovascular: Regular rate and rhythm. No murmurs, gallops or rubs. Abdomen:Soft. Bowel sounds present. Non-tender.  Extremities: No lower extremity edema. Pulses are 2 + in the bilateral DP/PT.  Echo 12/21/15: Left ventricle: The cavity size was moderately dilated. Wall thickness was normal. Systolic function was moderately reduced. The estimated ejection fraction was in the range of 35% to 40%. Diffuse hypokinesis. - Aortic valve: There was trivial regurgitation. - Mitral valve: There was mild regurgitation. - Left atrium: The atrium was moderately dilated. - Atrial septum: No defect or patent foramen ovale was identified  EKG:  EKG  is  ordered today. The ekg ordered today demonstrates NSR, rate 99 bpm. BAE. TWI inferior leads, unchanged.   Recent Labs: No results found for requested labs within last 8760 hours.   Lipid Panel    Component Value Date/Time   CHOL 146 12/23/2012 0515   TRIG 196 (H) 12/23/2012 0515   HDL 34 (L) 12/23/2012 0515   CHOLHDL 4.3 12/23/2012 0515   VLDL 39 12/23/2012 0515   LDLCALC 73 12/23/2012 0515     Wt Readings from Last 3 Encounters:  06/30/17 242 lb 12.8 oz (110.1 kg)  11/19/16 249 lb 12 oz (113.3 kg)  04/27/16 230 lb (104.3 kg)     Other studies Reviewed: Additional studies/ records that were reviewed today include: . Review of the above records demonstrates:    Assessment and Plan:   1. Non-ischemic Cardiomyopathy: This is felt to be due to be due to HTN and etoh abuse. BP is not well controlled. Continue beta blocker and ARB. Will increase Coreg to 12.5 mg po BID.   2. Chronic systolic CHF: Volume status is ok. Continue Lasix.    3. Etoh abuse: Cessation advised. He is only drinking occasional win.   4. Aortic valve insufficiency: Mild by echo 2016  5. HTN: BP is elevated today. Will continue Norvasc, Coreg, Cozaar and Hydralazine. Will increase Coreg to 12.5 mg po BID. He will buy  A BP cuff today and follow at home.   6. Mitral regurgitation: Mild by echo 2016  7. Tobacco abuse: Complete cessation recommended.   Current medicines are reviewed at length with the patient today.  The patient does not have concerns regarding medicines.  The following changes have been made:  no change  Labs/ tests ordered today include:   Orders Placed This Encounter  Procedures  . EKG 12-Lead    Disposition:   FU with me in 12 months  Signed, Verne Carrow, MD 06/30/2017 9:51 AM    [facility] [address] [place], [place], [place]  [address] Phone: [phone]; Fax: [phone]

## 2018-05-25 ENCOUNTER — Emergency Department (HOSPITAL_COMMUNITY)
Admission: EM | Admit: 2018-05-25 | Discharge: 2018-05-25 | Disposition: A | Discharge status: Home / Self Care | Payer: Managed Care, Other (non HMO) | Attending: Emergency Medicine | Admitting: Emergency Medicine

## 2018-05-25 ENCOUNTER — Encounter (HOSPITAL_COMMUNITY): Payer: Self-pay

## 2018-05-25 DIAGNOSIS — Z79899 Other long term (current) drug therapy: Secondary | ICD-10-CM | POA: Insufficient documentation

## 2018-05-25 DIAGNOSIS — X088XXA Exposure to other specified smoke, fire and flames, initial encounter: Secondary | ICD-10-CM | POA: Insufficient documentation

## 2018-05-25 DIAGNOSIS — Y999 Unspecified external cause status: Secondary | ICD-10-CM | POA: Diagnosis not present

## 2018-05-25 DIAGNOSIS — T23232A Burn of second degree of multiple left fingers (nail), not including thumb, initial encounter: Secondary | ICD-10-CM | POA: Insufficient documentation

## 2018-05-25 DIAGNOSIS — I110 Hypertensive heart disease with heart failure: Secondary | ICD-10-CM | POA: Diagnosis not present

## 2018-05-25 DIAGNOSIS — T320 Corrosions involving less than 10% of body surface: Secondary | ICD-10-CM | POA: Diagnosis not present

## 2018-05-25 DIAGNOSIS — Y9389 Activity, other specified: Secondary | ICD-10-CM | POA: Insufficient documentation

## 2018-05-25 DIAGNOSIS — T23202A Burn of second degree of left hand, unspecified site, initial encounter: Secondary | ICD-10-CM | POA: Insufficient documentation

## 2018-05-25 DIAGNOSIS — I5022 Chronic systolic (congestive) heart failure: Secondary | ICD-10-CM | POA: Diagnosis not present

## 2018-05-25 DIAGNOSIS — Y929 Unspecified place or not applicable: Secondary | ICD-10-CM | POA: Diagnosis not present

## 2018-05-25 MED ORDER — CARVEDILOL 12.5 MG PO TABS: 12.5000 mg | ORAL_TABLET | Freq: Two times a day (BID) | ORAL | 2 refills | Status: DC

## 2018-05-25 MED ORDER — AMLODIPINE BESYLATE 10 MG PO TABS: 10.0000 mg | ORAL_TABLET | Freq: Every day | ORAL | 11 refills | Status: DC

## 2018-05-25 MED ORDER — ATORVASTATIN CALCIUM 10 MG PO TABS: 10.0000 mg | ORAL_TABLET | Freq: Every day | ORAL | 11 refills | Status: DC

## 2018-05-25 MED ORDER — SILVER SULFADIAZINE 1 % EX CREA
TOPICAL_CREAM | Freq: Once | CUTANEOUS | Status: AC
  Administered 2018-05-25: 14:00:00 via TOPICAL
  Filled 2018-05-25: qty 50

## 2018-05-25 MED ORDER — ACETAMINOPHEN 500 MG PO TABS
1000.0000 mg | ORAL_TABLET | Freq: Once | ORAL | Status: AC
  Administered 2018-05-25: 1000 mg via ORAL
  Filled 2018-05-25: qty 2

## 2018-05-25 MED ORDER — HYDRALAZINE HCL 50 MG PO TABS: 50.0000 mg | ORAL_TABLET | Freq: Two times a day (BID) | ORAL | 0 refills | Status: DC
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
## 2018-08-13 ENCOUNTER — Other Ambulatory Visit: Payer: Self-pay

## 2018-08-13 ENCOUNTER — Emergency Department (HOSPITAL_COMMUNITY)
Admission: EM | Admit: 2018-08-13 | Discharge: 2018-08-13 | Disposition: A | Discharge status: Home / Self Care | Payer: Managed Care, Other (non HMO) | Attending: Emergency Medicine | Admitting: Emergency Medicine

## 2018-08-13 DIAGNOSIS — F17210 Nicotine dependence, cigarettes, uncomplicated: Secondary | ICD-10-CM | POA: Diagnosis not present

## 2018-08-13 DIAGNOSIS — M25531 Pain in right wrist: Secondary | ICD-10-CM | POA: Diagnosis not present

## 2018-08-13 DIAGNOSIS — Z79899 Other long term (current) drug therapy: Secondary | ICD-10-CM | POA: Diagnosis not present

## 2018-08-13 DIAGNOSIS — I110 Hypertensive heart disease with heart failure: Secondary | ICD-10-CM | POA: Diagnosis not present

## 2018-08-13 DIAGNOSIS — I5022 Chronic systolic (congestive) heart failure: Secondary | ICD-10-CM | POA: Diagnosis not present

## 2018-08-13 MED ORDER — COLCHICINE 0.6 MG PO TABS: ORAL_TABLET | ORAL | 0 refills | Status: DC

## 2018-08-13 MED ORDER — HYDROCODONE-ACETAMINOPHEN 5-325 MG PO TABS: 1.0000 | ORAL_TABLET | ORAL | 0 refills | Status: DC | PRN

## 2018-08-13 NOTE — ED Triage Notes (Signed)
Pt reports severe pain in right wrist. Pt denies injury.

## 2018-08-13 NOTE — ED Provider Notes (Signed)
[facility]-EMERGENCY DEPT Provider Note: Colin Dell, MD, FACEP  CSN: [id number] MRN: [id number] ARRIVAL: 08/13/18 at 0433 ROOM: WA20/[facility]20   CHIEF COMPLAINT  Wrist Pain   HISTORY OF PRESENT ILLNESS  08/13/18 5:38 AM Colin Walton is a 47 y.o. male who had the gradual onset of pain in his right wrist over the past 12 hours.  The pain is now severe and he has limited range of motion of the right wrist.  He denies any injury.  He denies a history of gout.  There is no associated erythema or warmth.     Past Medical History:  Diagnosis Date  . Cardiomyopathy (HCC)   . Chronic systolic CHF (congestive heart failure), NYHA class 3 (HCC)   . Hypertension   . Sleep apnea 12/24/12   Sleep study February 2014    Past Surgical History:  Procedure Laterality Date  . VASECTOMY      Family History  Problem Relation Age of Onset  . Arrhythmia Maternal Grandfather        Atrial fibrillation  . CAD Neg Hx     Social History   Tobacco Use  . Smoking status: Light Tobacco Smoker    Packs/day: 0.05    Years: 22.00    Pack years: 1.10    Types: Cigarettes  . Smokeless tobacco: Never Used  Substance Use Topics  . Alcohol use: No    Alcohol/week: 30.0 standard drinks    Types: 30 Standard drinks or equivalent per week    Comment: x 1 week  . Drug use: No    Types: Marijuana    Prior to Admission medications   Medication Sig Start Date End Date Taking? Authorizing Provider  amLODipine (NORVASC) 10 MG tablet Take 1 tablet (10 mg total) by mouth daily. 05/25/18  Yes McDonald, Mia A, PA-C  atorvastatin (LIPITOR) 10 MG tablet Take 1 tablet (10 mg total) by mouth daily. 05/25/18  Yes McDonald, Mia A, PA-C  carvedilol (COREG) 12.5 MG tablet Take 1 tablet (12.5 mg total) by mouth 2 (two) times daily. 05/25/18 08/23/18 Yes McDonald, Mia A, PA-C  furosemide (LASIX) 80 MG tablet Take 1 tablet (80 mg total) by mouth 2 (two) times daily. 06/30/17  Yes Kathleene Hazel, MD  hydrALAZINE  (APRESOLINE) 50 MG tablet Take 1 tablet (50 mg total) by mouth 2 (two) times daily. 05/25/18  Yes McDonald, Mia A, PA-C  losartan (COZAAR) 50 MG tablet Take 50 mg by mouth daily.   Yes [provider]    Allergies Patient has no known allergies.   REVIEW OF SYSTEMS  Negative except as noted here or in the History of Present Illness.   PHYSICAL EXAMINATION  Initial Vital Signs Blood pressure (!) 197/100, pulse 97, temperature 98 F (36.7 C), temperature source Oral, resp. rate 16, height 5\' 8"  (1.727 m), weight 104.3 kg, SpO2 97 %.  Examination General: Well-developed, well-nourished male in no acute distress; appearance consistent with age of record HENT: normocephalic; atraumatic Eyes: Normal appearance Neck: supple Heart: regular rate and rhythm Lungs: clear to auscultation bilaterally Abdomen: soft; nondistended Extremities: No deformity; tenderness and reduced range of motion of right wrist without erythema, warmth or swelling Neurologic: Awake, alert and oriented; motor function intact in all extremities and symmetric; no facial droop Skin: Warm and dry Psychiatric: Normal mood and affect   RESULTS  Summary of this visit's results, reviewed by myself:   EKG Interpretation  Date/Time:    Ventricular Rate:    PR  Interval:    QRS Duration:   QT Interval:    QTC Calculation:   R Axis:     Text Interpretation:        Laboratory Studies: No results found for this or any previous visit (from the past 24 hour(s)). Imaging Studies: No results found.  ED COURSE and MDM  Nursing notes and initial vitals signs, including pulse oximetry, reviewed.  Vitals:   08/13/18 0441 08/13/18 0443  BP: (!) 197/100   Pulse: 97   Resp: 16   Temp: 98 F (36.7 C)   TempSrc: Oral   SpO2: 97%   Weight:  104.3 kg  Height:  5\' 8"  (1.727 m)   Suspect new onset gout and will treat accordingly.  Patient was advised to return if symptoms worsen over the next 48 hours,  especially worsening pain, fever, erythema or warmth.   Consultation with the [facility] state controlled substances database reveals the patient has received one opioid prescription in the past 2 years.   PROCEDURES    ED DIAGNOSES     ICD-10-CM   1. Acute pain of right wrist M25.531        Yalonda Sample, Jonny Ruiz, MD 08/13/18 [phone]

## 2018-08-18 ENCOUNTER — Other Ambulatory Visit: Payer: Self-pay | Admitting: Cardiovascular Disease

## 2018-08-18 MED ORDER — AMLODIPINE BESYLATE 10 MG PO TABS
10.0000 mg | ORAL_TABLET | Freq: Every day | ORAL | 0 refills | Status: DC
Start: 2018-08-18 — End: 2018-11-18

## 2018-08-18 MED ORDER — AMLODIPINE BESYLATE 10 MG PO TABS: 10.0000 mg | ORAL_TABLET | Freq: Every day | ORAL | 0 refills | Status: DC

## 2018-11-17 NOTE — Progress Notes (Signed)
Chief Complaint  Patient presents with  . Follow-up    HTN   History of Present Illness: 47 yo male with history of HTN, OSA, cardiomyopathy and moderate alcohol use who is here today for cardiac follow up. He was admitted to [facility] December 2013 with acute CHF and was found to have reduced LV systolic function. He was diuresed and improved quickly. Echo 12/23/12 with LVEF 40-45%. He was discharged home on Lasix and Coreg. Sleep study February 2014 with severe OSA. He had been drinking 4-6 beers per day for years. He has had uncontrolled HTN for years. I performed a right and left heart cath on 04/02/13 with no evidence of CAD. Echo 12/21/15 with LVEF=35-40%.   He is here today for follow up. The patient denies any chest pain, dyspnea, palpitations, lower extremity edema, orthopnea, PND, dizziness, near syncope or syncope. He has been doing great. He has gained a few pounds but admits eating poorly.   Primary Care Physician: Renford Dills, MD  Past Medical History:  Diagnosis Date  . Cardiomyopathy (HCC)   . Chronic systolic CHF (congestive heart failure), NYHA class 3 (HCC)   . Hypertension   . Sleep apnea 12/24/12   Sleep study February 2014    Past Surgical History:  Procedure Laterality Date  . VASECTOMY      Current Outpatient Medications  Medication Sig Dispense Refill  . colchicine 0.6 MG tablet Take 2 tablets at first sign of gout flare and then 1 tablet an hour later.  Repeat every 3 days until gout flare subsides. 12 tablet 0  . amLODipine (NORVASC) 10 MG tablet Take 1 tablet (10 mg total) by mouth daily. 90 tablet 3  . atorvastatin (LIPITOR) 10 MG tablet Take 1 tablet (10 mg total) by mouth daily. 90 tablet 3  . carvedilol (COREG) 25 MG tablet Take 1 tablet (25 mg total) by mouth 2 (two) times daily. 180 tablet 3  . furosemide (LASIX) 80 MG tablet Take 1 tablet (80 mg total) by mouth 2 (two) times daily. 180 tablet 3  . hydrALAZINE (APRESOLINE) 50 MG tablet Take 1 tablet (50  mg total) by mouth 2 (two) times daily. 180 tablet 3  . losartan (COZAAR) 50 MG tablet Take 1 tablet (50 mg total) by mouth daily. 90 tablet 3   No current facility-administered medications for this visit.     No Known Allergies  Social History   Socioeconomic History  . Marital status: Single    Spouse name: Not on file  . Number of children: 3  . Years of education: Not on file  . Highest education level: Not on file  Occupational History  . Occupation: Works with Copywriter, advertising: [employer]  Social Needs  . Financial resource strain: Not on file  . Food insecurity:    Worry: Not on file    Inability: Not on file  . Transportation needs:    Medical: Not on file    Non-medical: Not on file  Tobacco Use  . Smoking status: Light Tobacco Smoker    Packs/day: 0.05    Years: 22.00    Pack years: 1.10    Types: Cigarettes  . Smokeless tobacco: Never Used  Substance and Sexual Activity  . Alcohol use: No    Alcohol/week: 30.0 standard drinks    Types: 30 Standard drinks or equivalent per week    Comment: x 1 week  . Drug use: No    Types: Marijuana  .  Sexual activity: Not on file  Lifestyle  . Physical activity:    Days per week: Not on file    Minutes per session: Not on file  . Stress: Not on file  Relationships  . Social connections:    Talks on phone: Not on file    Gets together: Not on file    Attends religious service: Not on file    Active member of club or organization: Not on file    Attends meetings of clubs or organizations: Not on file    Relationship status: Not on file  . Intimate partner violence:    Fear of current or ex partner: Not on file    Emotionally abused: Not on file    Physically abused: Not on file    Forced sexual activity: Not on file  Other Topics Concern  . Not on file  Social History Narrative  . Not on file    Family History  Problem Relation Age of Onset  . Arrhythmia Maternal Grandfather        Atrial fibrillation   . CAD Neg Hx     Review of Systems:  As stated in the HPI and otherwise negative.   Pulse (!) 107   Ht 5\' 8"  (1.727 m)   Wt 247 lb 12.8 oz (112.4 kg)   SpO2 95%   BMI 37.68 kg/m   Physical Examination:  General: Well developed, well nourished, NAD  HEENT: OP clear, mucus membranes moist  SKIN: warm, dry. No rashes. Neuro: No focal deficits  Musculoskeletal: Muscle strength 5/5 all ext  Psychiatric: Mood and affect normal  Neck: No JVD, no carotid bruits, no thyromegaly, no lymphadenopathy.  Lungs:Clear bilaterally, no wheezes, rhonci, crackles Cardiovascular: Regular rate and rhythm. No murmurs, gallops or rubs. Abdomen:Soft. Bowel sounds present. Non-tender.  Extremities: No lower extremity edema. Pulses are 2 + in the bilateral DP/PT.  Echo 12/21/15: Left ventricle: The cavity size was moderately dilated. Wall thickness was normal. Systolic function was moderately reduced. The estimated ejection fraction was in the range of 35% to 40%. Diffuse hypokinesis. - Aortic valve: There was trivial regurgitation. - Mitral valve: There was mild regurgitation. - Left atrium: The atrium was moderately dilated. - Atrial septum: No defect or patent foramen ovale was identified  EKG:  EKG is ordered today. The ekg ordered today demonstrates sinus tachycardia, rate 107 bpm.   Recent Labs: No results found for requested labs within last 8760 hours.   Lipid Panel    Component Value Date/Time   CHOL 146 12/23/2012 0515   TRIG 196 (H) 12/23/2012 0515   HDL 34 (L) 12/23/2012 0515   CHOLHDL 4.3 12/23/2012 0515   VLDL 39 12/23/2012 0515   LDLCALC 73 12/23/2012 0515     Wt Readings from Last 3 Encounters:  11/18/18 247 lb 12.8 oz (112.4 kg)  08/13/18 230 lb (104.3 kg)  05/25/18 240 lb 9 oz (109.1 kg)     Other studies Reviewed: Additional studies/ records that were reviewed today include: . Review of the above records demonstrates:    Assessment and Plan:   1.  Non-ischemic Cardiomyopathy: This is felt to be due to be due to HTN and etoh abuse. Will continue hydralazine, ARB and beta blocker. BP is well controlled.    2. Chronic systolic CHF:  Weight is up. He admits to dietary non-compliance. Volume status is ok. No LE edema. Continue Lasix.   3. Etoh abuse: He no longer drinks etoh.   4. Aortic  valve insufficiency: Mild by echo 2016  5. HTN: BP is elevated. Will increase Coreg to 25 mg po BID. Continue ARB, hydralazine and Norvasc. Norvasc is not ideal given his cardiomyopathy but it has been necessary to control his BP. Continue current meds.    6. Mitral regurgitation: Mild by echo 2016  7. Tobacco abuse:  I have asked him to stop smoking.    Current medicines are reviewed at length with the patient today.  The patient does not have concerns regarding medicines.  The following changes have been made:  no change  Labs/ tests ordered today include:   Orders Placed This Encounter  Procedures  . EKG 12-Lead    Disposition:   FU with me in 12 months  Signed, Verne Carrow, MD 11/18/2018 9:33 AM    [facility] [address] [place], [place], [place]  [address] Phone: [phone]; Fax: [phone]

## 2018-11-18 ENCOUNTER — Encounter: Payer: Self-pay | Admitting: Cardiovascular Disease

## 2018-11-18 ENCOUNTER — Ambulatory Visit (INDEPENDENT_AMBULATORY_CARE_PROVIDER_SITE_OTHER): Payer: Managed Care, Other (non HMO) | Admitting: Cardiovascular Disease

## 2018-11-18 VITALS — HR 107 | Ht 68.0 in | Wt 247.8 lb

## 2018-11-18 DIAGNOSIS — I10 Essential (primary) hypertension: Secondary | ICD-10-CM

## 2018-11-18 DIAGNOSIS — I5022 Chronic systolic (congestive) heart failure: Secondary | ICD-10-CM | POA: Diagnosis not present

## 2018-11-18 DIAGNOSIS — I428 Other cardiomyopathies: Secondary | ICD-10-CM

## 2018-11-18 DIAGNOSIS — I351 Nonrheumatic aortic (valve) insufficiency: Secondary | ICD-10-CM | POA: Diagnosis not present

## 2018-11-18 DIAGNOSIS — Z720 Tobacco use: Secondary | ICD-10-CM

## 2018-11-18 MED ORDER — LOSARTAN POTASSIUM 50 MG PO TABS: 50.0000 mg | ORAL_TABLET | Freq: Every day | ORAL | 3 refills | Status: DC

## 2018-11-18 MED ORDER — HYDRALAZINE HCL 50 MG PO TABS: 50.0000 mg | ORAL_TABLET | Freq: Two times a day (BID) | ORAL | 3 refills | Status: DC

## 2018-11-18 MED ORDER — ATORVASTATIN CALCIUM 10 MG PO TABS: 10.0000 mg | ORAL_TABLET | Freq: Every day | ORAL | 3 refills | Status: DC

## 2018-11-18 MED ORDER — AMLODIPINE BESYLATE 10 MG PO TABS: 10.0000 mg | ORAL_TABLET | Freq: Every day | ORAL | 3 refills | Status: DC

## 2018-11-18 MED ORDER — FUROSEMIDE 80 MG PO TABS: 80.0000 mg | ORAL_TABLET | Freq: Two times a day (BID) | ORAL | 3 refills | Status: DC

## 2018-11-18 MED ORDER — CARVEDILOL 25 MG PO TABS: 25.0000 mg | ORAL_TABLET | Freq: Two times a day (BID) | ORAL | 3 refills | Status: DC

## 2018-11-18 NOTE — Patient Instructions (Signed)
Medication Instructions:  Your physician has recommended you make the following change in your medication:  Increase Coreg to 25 mg by mouth twice daily.   If you need a refill on your cardiac medications before your next appointment, please call your pharmacy.   Lab work: none If you have labs (blood work) drawn today and your tests are completely normal, you will receive your results only by: Marland Kitchen MyChart Message (if you have MyChart) OR . A paper copy in the mail If you have any lab test that is abnormal or we need to change your treatment, we will call you to review the results.  Testing/Procedures: none  Follow-Up: At [facility], you and your health needs are our priority.  As part of our continuing mission to provide you with exceptional heart care, we have created designated Provider Care Teams.  These Care Teams include your primary Cardiologist (physician) and Advanced Practice Providers (APPs -  Physician Assistants and Nurse Practitioners) who all work together to provide you with the care you need, when you need it. You will need a follow up appointment in 12 months.  Please call our office 2 months in advance to schedule this appointment.  You may see Verne Carrow, MD or one of the following Advanced Practice Providers on your designated Care Team:   [place], PA-C [name], PA-C . Jacolyn Reedy, PA-C  Any Other Special Instructions Will Be Listed Below (If Applicable).

## 2018-12-03 ENCOUNTER — Telehealth: Payer: Self-pay | Admitting: Cardiovascular Disease

## 2018-12-03 NOTE — Telephone Encounter (Signed)
Please send to primary care

## 2018-12-03 NOTE — Telephone Encounter (Signed)
New message    *STAT* If patient is at the pharmacy, call can be transferred to refill team.   1. Which medications need to be refilled? (please list name of each medication and dose if known)  Colchicine 0.6  2. Which pharmacy/location (including street and city if local pharmacy) is medication to be sent to? [facility] on [address]   3. Do they need a 30 day or 90 day supply? 30 day

## 2018-12-03 NOTE — Telephone Encounter (Signed)
Spoke with patient and made him aware to contact pcp to address refill for colchicine. He verbalized his understanding.

## 2018-12-03 NOTE — Telephone Encounter (Signed)
Please advise on refill request. Thanks, MI 

## 2019-11-04 ENCOUNTER — Encounter: Payer: Self-pay | Admitting: Physician Assistant

## 2019-11-04 ENCOUNTER — Telehealth: Payer: Self-pay | Admitting: Physician Assistant

## 2019-11-04 NOTE — Progress Notes (Signed)
Virtual Visit via Video Note   This visit type was conducted due to national recommendations for restrictions regarding the COVID-19 Pandemic (e.g. social distancing) in an effort to limit this patient's exposure and mitigate transmission in our community.  Due to his co-morbid illnesses, this patient is at least at moderate risk for complications without adequate follow up.  This format is felt to be most appropriate for this patient at this time.  All issues noted in this document were discussed and addressed.  A limited physical exam was performed with this format.  Please refer to the patient's chart for his consent to telehealth for [facility].   Date:  11/08/2019   ID:  [name], DOB 1971-04-07, MRN [id number]  Patient Location: Home Provider Location: Home  PCP:  Renford Dills, MD  Cardiologist:  Verne Carrow, MD  Electrophysiologist:  None   Evaluation Performed:  Follow-Up Visit  Chief Complaint:  F/u CHF  History of Present Illness:    Colin Walton is a 48 y.o. male with HTN, OSA, nonischemic cardiomyopathy, hyperlipidemia and moderate alcohol use who is seen for virtual follow-up today, last OV 10/2018. He was originally admitted to [facility] 11/2012-12/2012 with acute CHF and was found to have reduced LV systolic function. He was diuresed and improved quickly. 2D echo 12/23/12 showed LVEF 40-45%. Sleep study February 2014 showed severe OSA prompting initiation of CPAP. He had been drinking 4-6 beers per day and had had uncontrolled HTN for years. Right and left heart cath 03/2013 showed no significant CAD, low-normal LV function with EF 55%. His last echo in 11/2015 showed EF 35-40% with diffuse hypokinesis, mild MR, trivial AI, moderately dilated LA. He was tachycardic at last visit 10/2018. Carvedilol was increased. Blood pressure was not listed at that visit but reported to be controlled. Last labs by [facility] in 2018 showed albumin 4.2, normal AST/ALT, Cr 1.16,  Hgb 17.3, K 4.9, TSH wnl, LDL 140, TSH 0.7.  He is seen back virtually for followup today. He reports chronic exertional dyspnea that has been stable over the last year. He also has noticed intermittent pitting edema at his sock line. He does not follow his BP regularly but reports good control today. He denies any CP, orthopnea, PND, palpitations or syncope. He reports compliance with medications. He does not weigh regularly. He has significantly cut back ETOH and drinks approximately 4 glasses of wine per week, and does not drink every day.  The patient does not have symptoms concerning for COVID-19 infection (fever, chills, cough, or new shortness of breath).    Past Medical History:  Diagnosis Date   Chronic systolic CHF (congestive heart failure) (HCC)    Hyperlipidemia    Hypertension    Mild mitral regurgitation 2016   NICM (nonischemic cardiomyopathy) (HCC)    Obesity    Sleep apnea 12/24/12   Sleep study February 2014   Past Surgical History:  Procedure Laterality Date   VASECTOMY       Current Meds  Medication Sig   amLODipine (NORVASC) 10 MG tablet Take 1 tablet (10 mg total) by mouth daily.   atorvastatin (LIPITOR) 10 MG tablet Take 1 tablet (10 mg total) by mouth daily.   carvedilol (COREG) 25 MG tablet Take 1 tablet (25 mg total) by mouth 2 (two) times daily.   colchicine 0.6 MG tablet Take 2 tablets at first sign of gout flare and then 1 tablet an hour later.  Repeat every 3 days until gout flare  subsides.   furosemide (LASIX) 80 MG tablet Take 1 tablet (80 mg total) by mouth 2 (two) times daily.   hydrALAZINE (APRESOLINE) 50 MG tablet Take 1 tablet (50 mg total) by mouth 2 (two) times daily.   losartan (COZAAR) 50 MG tablet Take 1 tablet (50 mg total) by mouth daily.     Allergies:   Patient has no known allergies.   Social History   Tobacco Use   Smoking status: Light Tobacco Smoker    Packs/day: 0.05    Years: 22.00    Pack years: 1.10     Types: Cigarettes   Smokeless tobacco: Never Used  Substance Use Topics   Alcohol use: No    Alcohol/week: 30.0 standard drinks    Types: 30 Standard drinks or equivalent per week    Comment: x 1 week   Drug use: No    Types: Marijuana     Family Hx: The patient's family history includes Arrhythmia in his maternal grandfather. There is no history of CAD.  ROS:   Please see the history of present illness.    All other systems reviewed and are negative.   Prior CV studies:    Most recent pertinent cardiac studies are outlined above.  Labs/Other Tests and Data Reviewed:    EKG:  An ECG dated 11/18/18 was personally reviewed today and demonstrated:  sinus tachycardia 107bpm, biatrial enlargement, nonspecific TW changes  Recent Labs: No results found for requested labs within last 8760 hours.   Recent Lipid Panel Lab Results  Component Value Date/Time   CHOL 146 12/23/2012 05:15 AM   TRIG 196 (H) 12/23/2012 05:15 AM   HDL 34 (L) 12/23/2012 05:15 AM   CHOLHDL 4.3 12/23/2012 05:15 AM   LDLCALC 73 12/23/2012 05:15 AM    Wt Readings from Last 3 Encounters:  11/18/18 247 lb 12.8 oz (112.4 kg)  08/13/18 230 lb (104.3 kg)  05/25/18 240 lb 9 oz (109.1 kg)     Objective:    Vital Signs:  BP 128/80    Pulse 93    Ht 5\' 7"  (1.702 m)    BMI 38.81 kg/m    VS reviewed. General - pleasant AAM in no acute distress HEENT - NCAT, EOM intact Pulm - No labored breathing, no coughing during visit, no audible wheezing, speaking in full sentences Extremities - on video, patient appears to have 1-2+ pitting bilateral LE edema Neuro - A+Ox3, no slurred speech, answers questions appropriately Psych - Pleasant affect    ASSESSMENT & PLAN:    1. NICM/Chronic systolic CHF with mild miltral regurgitation and exertional dyspnea - based on pitting edema and DOE, I suspect his LV function is still down. This previously flucutated but last echo in 2016 showed depressed EF of 35-40%. There  is certainly room to optimize his regimen to consider inclusion of spironolactone (in lieu of amlodipine) or consideration of transition from losartan to [facility]. Will plan to bring him in for full panel of labs - CMET, CBC, TSH, BNP and lipid profile - and update echocardiogram. Anticipate further med changes based on this information. I did not want to make any changes today since we do not have updated kidney function or potassium numbers. He is in need of refills of his medication so per our discussion will send in a 30 day supply of all, pending further testing. Also reviewed 2g sodium restriction, 2L fluid restriction, daily weights with patient. Will plan in-person re-eval with EKG after testing completed. 2. Essential  HTN - controlled per patient report. Needs updated labs. 3. OSA - he is compliant with CPAP per his report, but no one seems to be following this for him. Will have him establish with Dr. Radford Pax for management of sleep apnea. 4. Hyperlipidemia - due for updated CMET/lipids, will obtain at next convenience.  COVID-19 Education: The signs and symptoms of COVID-19 were discussed with the patient and how to seek care for testing (follow up with PCP or arrange E-visit).  The importance of social distancing was discussed today.  Time:   Today, I have spent 20 minutes with the patient with telehealth technology discussing the above problems.  Per his request he will be going to sleep shortly as he works the night shift and requests his instructions be sent to MyChart and for nurse to call him Wednesday or Thursday with instructions.  Medication Adjustments/Labs and Tests Ordered: Current medicines are reviewed at length with the patient today.  Concerns regarding medicines are outlined above.   Disposition:  Follow up for in-person visit with Dr. Angelena Form or APP in 2 weeks after testing.  Signed, Charlie Pitter, PA-C  11/08/2019 8:28 AM    [place]

## 2019-11-04 NOTE — Telephone Encounter (Signed)
Perfect - thank you! Imajean Mcdermid PA-C

## 2019-11-04 NOTE — Telephone Encounter (Signed)
Spoke with the pt re: his virtual appt scheduled for 11/08/19 at 8am... per Melina Copa PA he will need to have his BP and HR, due to his history, prior to his appt.. 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 he will talk with her this evening when he goes into work to be sure she will be available that morning, if not he will call our office first thing in the morning and reschedule to an in office appt.

## 2019-11-04 NOTE — Telephone Encounter (Addendum)
   Chart reviewed in prep for clinic next week - listed as virtual Ov. Please confirm patient will have a way to check his vitals at this visit (tachycardic last year and meds were adjusted). If unable to do so would best be served with in person visit instead. I also anticipate we will need labs since his last labs were in 2016. Dayna Dunn PA-C

## 2019-11-08 ENCOUNTER — Other Ambulatory Visit: Payer: Self-pay

## 2019-11-08 ENCOUNTER — Telehealth (INDEPENDENT_AMBULATORY_CARE_PROVIDER_SITE_OTHER): Payer: Managed Care, Other (non HMO) | Admitting: Physician Assistant

## 2019-11-08 ENCOUNTER — Telehealth: Payer: Self-pay | Admitting: *Deleted

## 2019-11-08 ENCOUNTER — Telehealth: Payer: Self-pay

## 2019-11-08 ENCOUNTER — Encounter: Payer: Self-pay | Admitting: Physician Assistant

## 2019-11-08 ENCOUNTER — Encounter: Payer: Self-pay | Admitting: *Deleted

## 2019-11-08 VITALS — BP 128/80 | HR 93 | Ht 67.0 in

## 2019-11-08 DIAGNOSIS — G4733 Obstructive sleep apnea (adult) (pediatric): Secondary | ICD-10-CM

## 2019-11-08 DIAGNOSIS — I428 Other cardiomyopathies: Secondary | ICD-10-CM | POA: Diagnosis not present

## 2019-11-08 DIAGNOSIS — I340 Nonrheumatic mitral (valve) insufficiency: Secondary | ICD-10-CM

## 2019-11-08 DIAGNOSIS — I5022 Chronic systolic (congestive) heart failure: Secondary | ICD-10-CM

## 2019-11-08 DIAGNOSIS — R0609 Other forms of dyspnea: Secondary | ICD-10-CM

## 2019-11-08 DIAGNOSIS — E785 Hyperlipidemia, unspecified: Secondary | ICD-10-CM

## 2019-11-08 DIAGNOSIS — I10 Essential (primary) hypertension: Secondary | ICD-10-CM

## 2019-11-08 MED ORDER — LOSARTAN POTASSIUM 50 MG PO TABS: 50.0000 mg | ORAL_TABLET | Freq: Every day | ORAL | 3 refills | Status: DC

## 2019-11-08 MED ORDER — CARVEDILOL 25 MG PO TABS: 25.0000 mg | ORAL_TABLET | Freq: Two times a day (BID) | ORAL | 3 refills | Status: DC

## 2019-11-08 MED ORDER — FUROSEMIDE 80 MG PO TABS: 80.0000 mg | ORAL_TABLET | Freq: Two times a day (BID) | ORAL | 3 refills | Status: DC

## 2019-11-08 MED ORDER — AMLODIPINE BESYLATE 10 MG PO TABS: 10.0000 mg | ORAL_TABLET | Freq: Every day | ORAL | 3 refills | Status: AC

## 2019-11-08 MED ORDER — HYDRALAZINE HCL 50 MG PO TABS: 50.0000 mg | ORAL_TABLET | Freq: Two times a day (BID) | ORAL | 3 refills | Status: DC

## 2019-11-08 MED ORDER — ATORVASTATIN CALCIUM 10 MG PO TABS: 10.0000 mg | ORAL_TABLET | Freq: Every day | ORAL | 3 refills | Status: AC

## 2019-11-08 NOTE — Telephone Encounter (Signed)
Spoke with pt and confirmed medications, pharmacy, and vitals. Virtual consent was also obtained from the pt to perform this visit.    YOUR CARDIOLOGY TEAM HAS ARRANGED FOR AN E-VISIT FOR YOUR APPOINTMENT - PLEASE REVIEW IMPORTANT INFORMATION BELOW SEVERAL DAYS PRIOR TO YOUR APPOINTMENT  Due to the recent COVID-19 pandemic, we are transitioning in-person office visits to tele-medicine visits in an effort to decrease unnecessary exposure to our patients, their families, and staff. These visits are billed to your insurance just like a normal visit is. We also encourage you to sign up for MyChart if you have not already done so. You will need a smartphone if possible. For patients that do not have this, we can still complete the visit using a regular telephone but do prefer a smartphone to enable video when possible. You may have a family member that lives with you that can help. If possible, we also ask that you have a blood pressure cuff and scale at home to measure your blood pressure, heart rate and weight prior to your scheduled appointment. Patients with clinical needs that need an in-person evaluation and testing will still be able to come to the office if absolutely necessary. If you have any questions, feel free to call our office.     YOUR PROVIDER WILL BE USING THE FOLLOWING PLATFORM TO COMPLETE YOUR VISIT: [email or web address] . IF USING MYCHART - How to Download the MyChart App to Your SmartPhone   - If Apple, go to [employer] and type in MyChart in the search bar and download the app. If Android, ask patient to go to [employer] and type in [place] in the search bar and download the app. The app is free but as with any other app downloads, your phone may require you to verify saved payment information or Apple/Android password.  - You will need to then log into the app with your MyChart username and password, and select [facility] as your healthcare provider to link the account.  - When it is  time for your visit, go to the MyChart app, find appointments, and click Begin Video Visit. Be sure to Select Allow for your device to access the Microphone and Camera for your visit. You will then be connected, and your provider will be with you shortly.  **If you have any issues connecting or need assistance, please contact MyChart service desk [phone])**  **If using a computer, in order to ensure the best quality for your visit, you will need to use either of the following Internet Browsers: Agricultural consultant or [employer]**  . IF USING DOXIMITY or DOXY.ME - The staff will give you instructions on receiving your link to join the meeting the day of your visit.      2-3 DAYS BEFORE YOUR APPOINTMENT  You will receive a telephone call from one of our HeartCare team members - your caller ID may say "Unknown caller." If this is a video visit, we will walk you through how to get the video launched on your phone. We will remind you check your blood pressure, heart rate and weight prior to your scheduled appointment. If you have an Apple Watch or Kardia, please upload any pertinent ECG strips the day before or morning of your appointment to MyChart. Our staff will also make sure you have reviewed the consent and agree to move forward with your scheduled tele-health visit.     THE DAY OF YOUR APPOINTMENT  Approximately 15 minutes prior to  your scheduled appointment, you will receive a telephone call from one of [facility] team - your caller ID may say "Unknown caller."  Our staff will confirm medications, vital signs for the day and any symptoms you may be experiencing. Please have this information available prior to the time of visit start. It may also be helpful for you to have a pad of paper and pen handy for any instructions given during your visit. They will also walk you through joining the smartphone meeting if this is a video visit.    CONSENT FOR TELE-HEALTH VISIT - PLEASE  REVIEW  I hereby voluntarily request, consent and authorize [facility] and its employed or contracted physicians, physician assistants, nurse practitioners or other licensed health care professionals (the Practitioner), to provide me with telemedicine health care services (the "Services") as deemed necessary by the treating Practitioner. I acknowledge and consent to receive the Services by the Practitioner via telemedicine. I understand that the telemedicine visit will involve communicating with the Practitioner through live audiovisual communication technology and the disclosure of certain medical information by electronic transmission. I acknowledge that I have been given the opportunity to request an in-person assessment or other available alternative prior to the telemedicine visit and am voluntarily participating in the telemedicine visit.  I understand that I have the right to withhold or withdraw my consent to the use of telemedicine in the course of my care at any time, without affecting my right to future care or treatment, and that the Practitioner or I may terminate the telemedicine visit at any time. I understand that I have the right to inspect all information obtained and/or recorded in the course of the telemedicine visit and may receive copies of available information for a reasonable fee.  I understand that some of the potential risks of receiving the Services via telemedicine include:  Marland Kitchen Delay or interruption in medical evaluation due to technological equipment failure or disruption; . Information transmitted may not be sufficient (e.g. poor resolution of images) to allow for appropriate medical decision making by the Practitioner; and/or  . In rare instances, security protocols could fail, causing a breach of personal health information.  Furthermore, I acknowledge that it is my responsibility to provide information about my medical history, conditions and care that is complete and  accurate to the best of my ability. I acknowledge that Practitioner's advice, recommendations, and/or decision may be based on factors not within their control, such as incomplete or inaccurate data provided by me or distortions of diagnostic images or specimens that may result from electronic transmissions. I understand that the practice of medicine is not an exact science and that Practitioner makes no warranties or guarantees regarding treatment outcomes. I acknowledge that I will receive a copy of this consent concurrently upon execution via email to the email address I last provided but may also request a printed copy by calling the office of [facility].    I understand that my insurance will be billed for this visit.   I have read or had this consent read to me. . I understand the contents of this consent, which adequately explains the benefits and risks of the Services being provided via telemedicine.  . I have been provided ample opportunity to ask questions regarding this consent and the Services and have had my questions answered to my satisfaction. . I give my informed consent for the services to be provided through the use of telemedicine in my medical care  By participating in  this telemedicine visit I agree to the above.

## 2019-11-08 NOTE — Telephone Encounter (Signed)
Pt had virtual visit with Melina Copa, PA.  He asked we call him later this week to schedule echo,fasting labs and follow up appointment. Will contact pt later this week.

## 2019-11-08 NOTE — Telephone Encounter (Signed)
-----   Message from Sueanne Margarita, MD sent at 11/08/2019  2:25 PM EST ----- Regarding: RE: new patient appt Ok to set up next available appt  Traci ----- Message ----- From: Freada Bergeron, CMA Sent: 11/08/2019   9:23 AM EST To: Sueanne Margarita, MD Subject: FW: new patient appt                           Sleep study in media 02/16/2013. Please advise ----- Message ----- From: Thompson Grayer, RN Sent: 11/08/2019   8:49 AM EST To: Freada Bergeron, CMA Subject: new patient appt                               Needs new pt appointment with Dr. Radford Pax for OSA (no one currently follows it but he uses CPAP)  -message above is from [facility]'s note-Can you call him to schedule the appointment?  Thanks, Fraser Din

## 2019-11-08 NOTE — Telephone Encounter (Signed)
Patient scheduled for 11/18 at 10:40 to discuss sleep apnea.

## 2019-11-08 NOTE — Patient Instructions (Addendum)
Medication Instructions:  Your physician recommends that you continue on your current medications as directed. Please refer to the Current Medication list given to you today.  *If you need a refill on your cardiac medications before your next appointment, please call your pharmacy*  Lab Work: Your physician recommends that you return for lab work on day of echocardiogram.  This will be fasting--Lipid profile, CMET, CBC, TSH, BNP  If you have labs (blood work) drawn today and your tests are completely normal, you will receive your results only by: Marland Kitchen MyChart Message (if you have MyChart) OR . A paper copy in the mail If you have any lab test that is abnormal or we need to change your treatment, we will call you to review the results.  Testing/Procedures: Your physician has requested that you have an echocardiogram. Echocardiography is a painless test that uses sound waves to create images of your heart. It provides your doctor with information about the size and shape of your heart and how well your heart's chambers and valves are working. This procedure takes approximately one hour. There are no restrictions for this procedure. When office calls to schedule this please ask for it to be done in the early morning so fasting lab work can be done the same day.     Follow-Up: At [facility], you and your health needs are our priority.  As part of our continuing mission to provide you with exceptional heart care, we have created designated Provider Care Teams.  These Care Teams include your primary Cardiologist (physician) and Advanced Practice Providers (APPs -  Physician Assistants and Nurse Practitioners) who all work together to provide you with the care you need, when you need it.  Your next appointment:   About 2 weeks (after echocardiogram and lab work)--our office will contact you to schedule this appointment  The format for your next appointment:   In Person  Provider:   You may see  Lauree Chandler, MD or one of the following Advanced Practice Providers on your designated Care Team:    Melina Copa, PA-C  Ermalinda Barrios, PA-C  We will also contact you to schedule a new patient appointment with Dr Radford Pax to follow your sleep apnea Other Instructions  For patients with congestive heart failure, we give them these special instructions:  1. Follow a low-salt diet - you are allowed no more than 2,000mg  of sodium per day. Watch your fluid intake. In general, you should not be taking in more than about 64 ounces of fluid per day. This includes sources of water in foods like soup, coffee, tea, milk, etc.  2. Weigh yourself on the same scale at same time of day and keep a log.  3. Call your doctor: (Anytime you feel any of the following symptoms)  - 3lb weight gain overnight or 5lb within a few days  - Shortness of breath, with or without a dry hacking cough  - Swelling in the hands, feet or stomach  - If you have to sleep on extra pillows at night in order to breathe   IT IS IMPORTANT TO LET YOUR DOCTOR KNOW EARLY ON IF YOU ARE HAVING SYMPTOMS SO WE CAN HELP YOU!

## 2019-11-10 ENCOUNTER — Telehealth (INDEPENDENT_AMBULATORY_CARE_PROVIDER_SITE_OTHER): Payer: Managed Care, Other (non HMO) | Admitting: Cardiology

## 2019-11-10 ENCOUNTER — Other Ambulatory Visit: Payer: Self-pay

## 2019-11-10 ENCOUNTER — Telehealth: Payer: Self-pay | Admitting: *Deleted

## 2019-11-10 VITALS — Ht 67.0 in | Wt 238.0 lb

## 2019-11-10 DIAGNOSIS — G4733 Obstructive sleep apnea (adult) (pediatric): Secondary | ICD-10-CM

## 2019-11-10 DIAGNOSIS — I10 Essential (primary) hypertension: Secondary | ICD-10-CM

## 2019-11-10 DIAGNOSIS — E669 Obesity, unspecified: Secondary | ICD-10-CM

## 2019-11-10 NOTE — Telephone Encounter (Signed)
Echo and lab work have been scheduled for November 30,2020.  I placed call to pt to schedule follow up in person visit.  Left message to call office.

## 2019-11-10 NOTE — Telephone Encounter (Signed)
° ° °  Patient states he wants to wait schedule appt after sleep study arranged

## 2019-11-10 NOTE — Telephone Encounter (Signed)
Sent to sleep lab

## 2019-11-10 NOTE — Progress Notes (Signed)
Virtual Visit via Video Note   This visit type was conducted due to national recommendations for restrictions regarding the COVID-19 Pandemic (e.g. social distancing) in an effort to limit this patient's exposure and mitigate transmission in our community.  Due to his co-morbid illnesses, this patient is at least at moderate risk for complications without adequate follow up.  This format is felt to be most appropriate for this patient at this time.  All issues noted in this document were discussed and addressed.  A limited physical exam was performed with this format.  Please refer to the patient's chart for his consent to telehealth for [facility].   Evaluation Performed:  Follow-up visit  This visit type was conducted due to national recommendations for restrictions regarding the COVID-19 Pandemic (e.g. social distancing).  This format is felt to be most appropriate for this patient at this time.  All issues noted in this document were discussed and addressed.  No physical exam was performed (except for noted visual exam findings with Video Visits).  Please refer to the patient's chart (MyChart message for video visits and phone note for telephone visits) for the patient's consent to telehealth for [facility].  Date:  11/10/2019   ID:  Colin Walton, DOB Jan 26, 1971, MRN [id number]  Patient Location:  Home  Provider location:   [place]  PCP:  Seward Carol, MD  Cardiologist:  Lauree Chandler, MD  Sleep Medicine: Fransico Him, MD Electrophysiologist:  None   Chief Complaint:  OSA  History of Present Illness:    Colin Walton is a 48 y.o. male who presents via audio/video conferencing for a telehealth visit today.    This is a 48yo male with a hx of OSA, NICM and chornic systolic CHF.  He had a sleep study in 2014 that showed severe OSA with an AHI of 115/hr and was initially started on CPAP but due to ongoing respiratory events he was titrated on BIPAP and has  been on a setting of 14/60mmHg.   He is now here to establish care with a  Sleep MD. He is doing well with his CPAP device.  He tolerates the mask and feels the pressure is adequate.  He has been having some sleepiness during the day but works 12 hours shifts.  He would like to get a new PCP device.   The patient does not have symptoms concerning for COVID-19 infection (fever, chills, cough, or new shortness of breath).   Prior CV studies:   The following studies were reviewed today:  PAP compliance download  Past Medical History:  Diagnosis Date  . Chronic systolic CHF (congestive heart failure) ([place])   . Hyperlipidemia   . Hypertension   . Mild mitral regurgitation 2016  . NICM (nonischemic cardiomyopathy) ([place])   . Obesity   . Sleep apnea 12/24/12   Sleep study February 2014   Past Surgical History:  Procedure Laterality Date  . VASECTOMY       Current Meds  Medication Sig  . amLODipine (NORVASC) 10 MG tablet Take 1 tablet (10 mg total) by mouth daily.  Marland Kitchen atorvastatin (LIPITOR) 10 MG tablet Take 1 tablet (10 mg total) by mouth daily.  . carvedilol (COREG) 25 MG tablet Take 1 tablet (25 mg total) by mouth 2 (two) times daily.  . colchicine 0.6 MG tablet Take 2 tablets at first sign of gout flare and then 1 tablet an hour later.  Repeat every 3 days until gout flare subsides.  Marland Kitchen  furosemide (LASIX) 80 MG tablet Take 1 tablet (80 mg total) by mouth 2 (two) times daily.  . hydrALAZINE (APRESOLINE) 50 MG tablet Take 1 tablet (50 mg total) by mouth 2 (two) times daily.  Marland Kitchen losartan (COZAAR) 50 MG tablet Take 1 tablet (50 mg total) by mouth daily.     Allergies:   Patient has no known allergies.   Social History   Tobacco Use  . Smoking status: Light Tobacco Smoker    Packs/day: 0.05    Years: 22.00    Pack years: 1.10    Types: Cigarettes  . Smokeless tobacco: Never Used  Substance Use Topics  . Alcohol use: No    Alcohol/week: 30.0 standard drinks    Types: 30 Standard  drinks or equivalent per week    Comment: x 1 week  . Drug use: No    Types: Marijuana     Family Hx: The patient's family history includes Arrhythmia in his maternal grandfather. There is no history of CAD.  ROS:   Please see the history of present illness.     All other systems reviewed and are negative.   Labs/Other Tests and Data Reviewed:    Recent Labs: No results found for requested labs within last 8760 hours.   Recent Lipid Panel Lab Results  Component Value Date/Time   CHOL 146 12/23/2012 05:15 AM   TRIG 196 (H) 12/23/2012 05:15 AM   HDL 34 (L) 12/23/2012 05:15 AM   CHOLHDL 4.3 12/23/2012 05:15 AM   LDLCALC 73 12/23/2012 05:15 AM    Wt Readings from Last 3 Encounters:  11/10/19 238 lb (108 kg)  11/18/18 247 lb 12.8 oz (112.4 kg)  08/13/18 230 lb (104.3 kg)     Objective:    Vital Signs:  Ht 5\' 7"  (1.702 m)   Wt 238 lb (108 kg)   BMI 37.28 kg/m    CONSTITUTIONAL:  Well nourished, well developed male in no acute distress.  EYES: anicteric MOUTH: oral mucosa is pink RESPIRATORY: Normal respiratory effort, symmetric expansion CARDIOVASCULAR: No peripheral edema SKIN: No rash, lesions or ulcers MUSCULOSKELETAL: no digital cyanosis NEURO: Cranial Nerves II-XII grossly intact, moves all extremities PSYCH: Intact judgement and insight.  A&O x 3, Mood/affect appropriate   ASSESSMENT & PLAN:    1.  OSA -  The patient has been using and benefiting from PAP use and will continue to benefit from therapy. He says that his device is very old and he would like to get a new machine.  Since it has been so long since he had his last sleep study we will get a split night sleep study and then get device ordered.  2.  HTN -BP controlled on exam -continue amlodipine 10mg  daily, Carvedilol 25mg  BID, Hydralazine 50mg  BID and Losartan 50mg  daily.   3.  Obesity -I have encouraged him to get into a routine exercise program and cut back on carbs and portions.   COVID-19  Education: The signs and symptoms of COVID-19 were discussed with the patient and how to seek care for testing (follow up with PCP or arrange E-visit).  The importance of social distancing was discussed today.  Patient Risk:   After full review of this patient's clinical status, I feel that they are at least moderate risk at this time.  Time:   Today, I have spent 20 minutes directly with the patient on telemedicine discussing medical problems including OSA, HTN.  We also reviewed the symptoms of COVID 19 and the  ways to protect against contracting the virus with telehealth technology.  I spent an additional 5 minutes reviewing patient's chart including pap compliance download.  Medication Adjustments/Labs and Tests Ordered: Current medicines are reviewed at length with the patient today.  Concerns regarding medicines are outlined above.  Tests Ordered: No orders of the defined types were placed in this encounter.  Medication Changes: No orders of the defined types were placed in this encounter.   Disposition:  Follow up after sleep study  Signed, Armanda Magic, MD  11/10/2019 10:50 AM    [facility]

## 2019-11-10 NOTE — Telephone Encounter (Signed)
-----   Message from Sueanne Margarita, MD sent at 11/10/2019 10:51 AM EST ----- Please order spilt night sleep study for OSA and followup after sleep study

## 2019-11-16 NOTE — Telephone Encounter (Signed)
This encounter was created in error - please disregard.

## 2019-11-22 ENCOUNTER — Other Ambulatory Visit: Payer: Self-pay

## 2019-11-22 ENCOUNTER — Ambulatory Visit (HOSPITAL_COMMUNITY): Payer: Managed Care, Other (non HMO) | Attending: Cardiology

## 2019-11-22 ENCOUNTER — Other Ambulatory Visit: Payer: Managed Care, Other (non HMO) | Admitting: *Deleted

## 2019-11-22 DIAGNOSIS — R0609 Other forms of dyspnea: Secondary | ICD-10-CM

## 2019-11-22 DIAGNOSIS — I5022 Chronic systolic (congestive) heart failure: Secondary | ICD-10-CM | POA: Diagnosis not present

## 2019-11-22 DIAGNOSIS — R0600 Dyspnea, unspecified: Secondary | ICD-10-CM | POA: Diagnosis not present

## 2019-11-22 DIAGNOSIS — E785 Hyperlipidemia, unspecified: Secondary | ICD-10-CM

## 2019-11-22 DIAGNOSIS — I428 Other cardiomyopathies: Secondary | ICD-10-CM

## 2019-11-22 DIAGNOSIS — I340 Nonrheumatic mitral (valve) insufficiency: Secondary | ICD-10-CM

## 2019-11-22 DIAGNOSIS — I10 Essential (primary) hypertension: Secondary | ICD-10-CM

## 2019-11-22 DIAGNOSIS — G4733 Obstructive sleep apnea (adult) (pediatric): Secondary | ICD-10-CM

## 2019-11-22 LAB — TSH: TSH: 1.7 u[IU]/mL (ref 0.450–4.500)

## 2019-11-22 LAB — COMPREHENSIVE METABOLIC PANEL
ALT: 12 IU/L (ref 0–44)
AST: 20 IU/L (ref 0–40)
Albumin/Globulin Ratio: 1.3 (ref 1.2–2.2)
Albumin: 3.6 g/dL — ABNORMAL LOW (ref 4.0–5.0)
Alkaline Phosphatase: 104 IU/L (ref 39–117)
BUN/Creatinine Ratio: 10 (ref 9–20)
BUN: 12 mg/dL (ref 6–24)
Bilirubin Total: 1.4 mg/dL — ABNORMAL HIGH (ref 0.0–1.2)
CO2: 24 mmol/L (ref 20–29)
Calcium: 8.9 mg/dL (ref 8.7–10.2)
Chloride: 100 mmol/L (ref 96–106)
Creatinine, Ser: 1.16 mg/dL (ref 0.76–1.27)
GFR calc Af Amer: 86 mL/min/{1.73_m2} (ref >59)
GFR calc non Af Amer: 75 mL/min/{1.73_m2} (ref >59)
Globulin, Total: 2.8 g/dL (ref 1.5–4.5)
Glucose: 111 mg/dL — ABNORMAL HIGH (ref 65–99)
Potassium: 3.5 mmol/L (ref 3.5–5.2)
Sodium: 140 mmol/L (ref 134–144)
Total Protein: 6.4 g/dL (ref 6.0–8.5)

## 2019-11-22 LAB — PRO B NATRIURETIC PEPTIDE: NT-Pro BNP: 1711 pg/mL — ABNORMAL HIGH (ref 0–121)

## 2019-11-22 LAB — CBC
Hematocrit: 48.1 % (ref 37.5–51.0)
Hemoglobin: 15.6 g/dL (ref 13.0–17.7)
MCH: 27.4 pg (ref 26.6–33.0)
MCHC: 32.4 g/dL (ref 31.5–35.7)
MCV: 84 fL (ref 79–97)
Platelets: 267 10*3/uL (ref 150–450)
RBC: 5.7 x10E6/uL (ref 4.14–5.80)
RDW: 14.1 % (ref 11.6–15.4)
WBC: 5.4 10*3/uL (ref 3.4–10.8)

## 2019-11-22 LAB — LIPID PANEL
Chol/HDL Ratio: 3.9 ratio (ref 0.0–5.0)
Cholesterol, Total: 125 mg/dL (ref 100–199)
HDL: 32 mg/dL — ABNORMAL LOW (ref >39)
LDL Chol Calc (NIH): 73 mg/dL (ref 0–99)
Triglycerides: 106 mg/dL (ref 0–149)
VLDL Cholesterol Cal: 20 mg/dL (ref 5–40)

## 2019-11-23 ENCOUNTER — Telehealth: Payer: Self-pay

## 2019-11-23 ENCOUNTER — Other Ambulatory Visit: Payer: Self-pay

## 2019-11-23 ENCOUNTER — Telehealth: Payer: Self-pay | Admitting: *Deleted

## 2019-11-23 MED ORDER — POTASSIUM CHLORIDE ER 20 MEQ PO TBCR: 40.0000 meq | EXTENDED_RELEASE_TABLET | Freq: Every day | ORAL | 3 refills | Status: DC

## 2019-11-23 NOTE — Telephone Encounter (Addendum)
Angie - I put this patient on your schedule for Thursday morning. I scoured the templates at [facility] and could not find any availability for at least the next 2 weeks, and I am concerned about this patient.  I recently met him via telemedicine for his routine followup (have not met him in person before). He has h/o NICM going back to 2014. At one point he did have a normalized LVEF but it's been low again since 2016.  He complained of continued DOE and also increased swelling. He appeared to have pitting edema on exam. He reported his blood pressure was totally normal although it does seem a little odd to me that his BP had been historically uncontrolled in other encounters. It was not recorded at last OV 10/2018. I got an echo and his EF is still persistently low, a little worse than prior (now 30-35%). He also has moderate to severe aortic insufficiency - have cc'd report to Dr. Angelena Form to get his input on how he would proceed.   He likely needs HF tuning up to include consideration of changing losartan to [facility] first, then possibly transitioning off amlodipine into spiro or Imdur + hydral. It all really depends on how his blood pressure actually looks in the office. His K and Cr were OK but BNP was up so we titrated his Lasix from 59m BID to 1253mQAM/805mPM x 3 days in prep to offload him a little bit. If he does not have good clinical response to this, consideration can be given to changing to torsemide vs a trial of limited metolazone.  I was uncomfortable with the idea of us [place]ing this entirely via telemed since he needs an EKG, formal vitals check to verify the authenticity of his home blood pressure, and physical exam. I think his follow-up thereafter can be virtual and I am more than happy to follow after you see him for the initial visit.   ChrGerald Stabst what point would you consider TEE as suggested in the echo? Before or after med titration? Thanks in advance for everyone's  help. Dayna

## 2019-11-23 NOTE — Telephone Encounter (Signed)
Sleep study PA submitted to [facility] via web portal.

## 2019-11-23 NOTE — Telephone Encounter (Signed)
-----   Message from Charlie Pitter, [place] sent at 11/23/2019  7:54 AM EST ----- Triage, can we instead please if he can come into our [facility] office on Thursday to see [name]? I could not find any in person availability at [facility] for the next 1-2 weeks but Angie has several openings on Tuesday. Given his volume overload as well as leaky valve on echo below I really feel he needs to be seen in person if we can at all make this happen - he has only been seen via telemed recently - will need EKG at that visit. Please find out if patient can do so. I can relay recommendations from Dr. Angelena Form to [facility] when I hear back, but he really needs to be examined in person due to his persistently low EF and the fact that his aortic valve is leaking more than it had in prior years. I am also a little perplexed as to how his BP has been consistently high in other visits in our health system but normal by telemed visit so would be helpful to have this checked by nursing staff to make sure we are getting an accurate reading (the aortic insufficiency can contribute to some wide variations between the top and bottom numbers). Thanks.

## 2019-11-23 NOTE — Telephone Encounter (Signed)
I spoke to the patient and scheduled him for OV with [name] on Thursday at 8 am ([facility]).  He verbalized understanding.

## 2019-11-23 NOTE — Telephone Encounter (Signed)
Hey, Did not see this note. Just sent you another message regarding this. Thanks, chris

## 2019-11-23 NOTE — Telephone Encounter (Addendum)
Thank you :)

## 2019-11-24 NOTE — Telephone Encounter (Addendum)
FYI for visit tomorrow.  Colin Blanks, MD  Colin Walton, Nedra Hai, PA-C        Hey. His echo is odd. It is hard to see any AI in most views but the three chamber view shows some AI, probably moderate. I looked at it with Skeet Latch. She has recommended we get a TEE to better define the AI. I agree that the CHF clinic may be a good thing for him since he is so young. They can definitely keep a closer eye on him than we can. Thanks for seeing him. Gerald Stabs

## 2019-11-24 NOTE — Progress Notes (Signed)
Cardiology Office Note:    Date:  11/25/2019   ID:  Colin Walton, DOB 10/17/1971, MRN [id number]  PCP:  Renford DillsPolite, Ronald, MD  Cardiologist:  Verne Carrowhristopher McAlhany, MD   Referring MD: Renford DillsPolite, Ronald, MD   Chief Complaint  Patient presents with  . Follow-up    CHF, hypertension    History of Present Illness:    Colin Walton is a 48 y.o. male with a hx of hypertension, chronic systolic heart failure, nonischemic cardiomyopathy, moderate aortic insufficiency, OSA now on CPAP, and alcohol use. He was first diagnosed with CHF in 2013 during a hospitalization. Echo revealed EF of 40-45%. Follow up sleep study revealed severe OSA prompting CPAP therapy (followed by Dr. Mayford Knifeurner). He was also noted to have uncontrolled hypertension. Right and left heart cath 03/2013 with no obstructive CAD and an EF of 55%. Unfortunately, repeat echo 11/2015 revealed and EF of 35-40% and diffuse hypokinesis, trivial AI, and moderately dilated LA. [facility]. He reported DOE and pitting edema, prompting a repeat echocardiogram. Echo on  11/22/19 revealed an EF of 30-35% with LVH, mildly dilated left atrium, moderate aortic valve regurgitation, and elevated pulmonary pressure. Echo was reviewed and TEE was recommended to better evaluate the aortic valve. Given his volume overload on exam with follow up BNP of 1700, his lasix were increased to 120 qAM and 80 qPM. An in-office visit was recommended.   He presents today for follow up. He is currently maintained on amlodipine 10 mg daily, coreg 25 mg BID, hydralazine 50 mg BID, losartan 50 mg daily. He has been taking 120/80 mg lasix for 3 days and has noticed a big improvement in his swelling. Unfortunately, he did not take any of his BP medications this morning prior to his visit. He did not want to take lasix prior to an appt, so he didn't take anything. He has a BP cuff at home, but has not used it. He  doesn't know how to set it up. I have asked him to bring that cuff with him to his next office visit. He requests an order for PT because he gets winded while walking. With further questioning, he is walking the equivalent of 0.5 miles. I do not think he needs PT. I strongly encouraged him to keep walking and advised that it will get easier. We discussed sodium intake. He eats fast food about 3 times per week. I have asked him to decrease this and have provided the [employer].    Past Medical History:  Diagnosis Date  . Chronic systolic CHF (congestive heart failure) (HCC)   . Hyperlipidemia   . Hypertension   . Mild mitral regurgitation 2016  . NICM (nonischemic cardiomyopathy) (HCC)   . Obesity   . Sleep apnea 12/24/12   Sleep study February 2014    Past Surgical History:  Procedure Laterality Date  . VASECTOMY      Current Medications: Current Meds  Medication Sig  . amLODipine (NORVASC) 10 MG tablet Take 1 tablet (10 mg total) by mouth daily.  Marland Kitchen. atorvastatin (LIPITOR) 10 MG tablet Take 1 tablet (10 mg total) by mouth daily.  . carvedilol (COREG) 25 MG tablet Take 1 tablet (25 mg total) by mouth 2 (two) times daily.  . colchicine 0.6 MG tablet Take 2 tablets at first sign of gout flare and then 1 tablet an hour later.  Repeat every 3 days until gout flare  subsides.  . furosemide (LASIX) 80 MG tablet Take 1 tablet (80 mg total) by mouth 2 (two) times daily.  . hydrALAZINE (APRESOLINE) 50 MG tablet Take 1 tablet (50 mg total) by mouth 2 (two) times daily.  . potassium chloride 20 MEQ TBCR Take 40 mEq by mouth daily.  . [DISCONTINUED] losartan (COZAAR) 50 MG tablet Take 1 tablet (50 mg total) by mouth daily.     Allergies:   Patient has no known allergies.   Social History   Socioeconomic History  . Marital status: Single    Spouse name: Not on file  . Number of children: 3  . Years of education: Not on file  . Highest education level: Not on file  Occupational History  .  Occupation: Works with Copywriter, advertising: [employer]  Social Needs  . Financial resource strain: Not on file  . Food insecurity    Worry: Not on file    Inability: Not on file  . Transportation needs    Medical: Not on file    Non-medical: Not on file  Tobacco Use  . Smoking status: Light Tobacco Smoker    Packs/day: 0.05    Years: 22.00    Pack years: 1.10    Types: Cigarettes  . Smokeless tobacco: Never Used  Substance and Sexual Activity  . Alcohol use: No    Alcohol/week: 30.0 standard drinks    Types: 30 Standard drinks or equivalent per week    Comment: x 1 week  . Drug use: No    Types: Marijuana  . Sexual activity: Not on file  Lifestyle  . Physical activity    Days per week: Not on file    Minutes per session: Not on file  . Stress: Not on file  Relationships  . Social Musician on phone: Not on file    Gets together: Not on file    Attends religious service: Not on file    Active member of club or organization: Not on file    Attends meetings of clubs or organizations: Not on file    Relationship status: Not on file  Other Topics Concern  . Not on file  Social History Narrative  . Not on file     Family History: The patient's family history includes Arrhythmia in his maternal grandfather. There is no history of CAD.  ROS:   Please see the history of present illness.     All other systems reviewed and are negative.  EKGs/Labs/Other Studies Reviewed:    The following studies were reviewed today: Echo 11/22/19:  1. Left ventricular ejection fraction, by visual estimation, is 30 to 35%. The left ventricle has severely decreased function. There is mildly increased left ventricular hypertrophy.  2. Multiple segmental abnormalities exist. See findings.  3. Mildly dilated left ventricular internal cavity size.  4. Global right ventricle has normal systolic function.The right ventricular size is normal. No increase in right ventricular wall  thickness.  5. Left atrial size was mildly dilated.  6. Right atrial size was normal.  7. The mitral valve is normal in structure. Trace mitral valve regurgitation. No evidence of mitral stenosis.  8. The tricuspid valve is normal in structure. Tricuspid valve regurgitation is mild.  9. Aortic regurgitation PHT measures 253 msec. 10. Aortic valve regurgitation is moderate to severe. 11. The aortic valve is grossly normal. Aortic valve regurgitation is moderate to severe. No evidence of aortic valve sclerosis or stenosis. 12. The  pulmonic valve was normal in structure. Pulmonic valve regurgitation is mild. 13. Moderately elevated pulmonary artery systolic pressure. 14. The inferior vena cava is dilated in size with <50% respiratory variability, suggesting right atrial pressure of 15 mmHg. 15. Prior echo (trace aortic regurgitation). Now moderate to severe. Consider TEE for clarification.   EKG:  EKG is  ordered today.  The ekg ordered today demonstrates sinus tachycardia with HR 117, left atrial enlargement  Recent Labs: 11/22/2019: ALT 12; BUN 12; Creatinine, Ser 1.16; Hemoglobin 15.6; NT-Pro BNP 1,711; Platelets 267; Potassium 3.5; Sodium 140; TSH 1.700  Recent Lipid Panel    Component Value Date/Time   CHOL 125 11/22/2019 0907   TRIG 106 11/22/2019 0907   HDL 32 (L) 11/22/2019 0907   CHOLHDL 3.9 11/22/2019 0907   CHOLHDL 4.3 12/23/2012 0515   VLDL 39 12/23/2012 0515   LDLCALC 73 11/22/2019 0907    Physical Exam:    VS:  BP (!) 172/96   Pulse (!) 117   Ht 5\' 7"  (1.702 m)   Wt 252 lb 12.8 oz (114.7 kg)   BMI 39.59 kg/m     Wt Readings from Last 3 Encounters:  11/25/19 252 lb 12.8 oz (114.7 kg)  11/10/19 238 lb (108 kg)  11/18/18 247 lb 12.8 oz (112.4 kg)     GEN: Well nourished, well developed in no acute distress HEENT: Normal NECK: + JVD; No carotid bruits LYMPHATICS: No lymphadenopathy CARDIAC: regular rhythm, tachycardic rate, no murmurs, rubs, gallops  RESPIRATORY:  Clear to auscultation without rales, wheezing or rhonchi  ABDOMEN: Soft, non-tender, non-distended MUSCULOSKELETAL:  1+ LE edema; No deformity  SKIN: Warm and dry NEUROLOGIC:  Alert and oriented x 3 PSYCHIATRIC:  Normal affect   ASSESSMENT:    1. Essential hypertension, benign   2. Congestive heart failure, unspecified HF chronicity, unspecified heart failure type ([place])   3. NICM (nonischemic cardiomyopathy) (HCC)   4. Obesity (BMI 30-39.9)   5. OSA (obstructive sleep apnea)   6. Sinus tachycardia   7. Nonrheumatic aortic valve insufficiency    PLAN:    In order of problems listed above:  Chronic systolic heart failure - weight today is 252.8 lbs (247 lbs 10/2018) - he will get a digital scales for home - transition losartan to entresto 24-26 mg BID - he still has excess fluid - lower extremity edema and JVD, but sounds much improved - he will continue 120/80 lasix for three days after starting entresto, then decrease to 80 mg BID - given his age, recommend referral to AHF clinic - will collect BMP and BNP today - he will keep a BP, weight, and HR log   Moderate aortic regurgitation - will plan for TEE to better visualize the valve once he is closer to euvolemic   Sinus tachycardia - he states he gets very nervous at doctor's offices - I have asked him to include HR in his BP/weight log - he is at 25 mg coreg BID - once heart failure medications are titrated, may need to consider corlanor if he is still tachycardic   Hypertension - he his hypertensive today, but he did not take BP medications. He works 7p-7a and takes medications at adjusted times to work with this schedule - I think he needs another in-office appt to closely monitor his BP and check his home cuff - as above, will transition losartan to entresto   OSA on CPAP - this will help with his pressure control   Obesity - strongly  encouraged him to continue his walking program   I will  see him back in 2 weeks while we wait for AHF team appt. He prefers in-office appt at [facility] in the short term because he lives 3 minutes away. He generally sleeps during the day given his work schedule.   Medication Adjustments/Labs and Tests Ordered: Current medicines are reviewed at length with the patient today.  Concerns regarding medicines are outlined above.  Orders Placed This Encounter  Procedures  . Basic metabolic panel  . Brain natriuretic peptide  . AMB referral to CHF clinic  . EKG 12-Lead   Meds ordered this encounter  Medications  . sacubitril-valsartan (ENTRESTO) 24-26 MG    Sig: Take 1 tablet by mouth 2 (two) times daily.    Dispense:  60 tablet    Refill:  1    Signed, Marcelino Duster, [place]  11/25/2019 9:00 AM    [facility]

## 2019-11-25 ENCOUNTER — Telehealth: Payer: Managed Care, Other (non HMO) | Admitting: Cardiology

## 2019-11-25 ENCOUNTER — Encounter: Payer: Self-pay | Admitting: Physician Assistant

## 2019-11-25 ENCOUNTER — Ambulatory Visit: Payer: Managed Care, Other (non HMO) | Admitting: Physician Assistant

## 2019-11-25 ENCOUNTER — Other Ambulatory Visit: Payer: Self-pay

## 2019-11-25 VITALS — BP 172/96 | HR 117 | Ht 67.0 in | Wt 252.8 lb

## 2019-11-25 DIAGNOSIS — I351 Nonrheumatic aortic (valve) insufficiency: Secondary | ICD-10-CM

## 2019-11-25 DIAGNOSIS — I428 Other cardiomyopathies: Secondary | ICD-10-CM | POA: Diagnosis not present

## 2019-11-25 DIAGNOSIS — E669 Obesity, unspecified: Secondary | ICD-10-CM

## 2019-11-25 DIAGNOSIS — I10 Essential (primary) hypertension: Secondary | ICD-10-CM | POA: Diagnosis not present

## 2019-11-25 DIAGNOSIS — R000 Tachycardia, unspecified: Secondary | ICD-10-CM

## 2019-11-25 DIAGNOSIS — G4733 Obstructive sleep apnea (adult) (pediatric): Secondary | ICD-10-CM

## 2019-11-25 DIAGNOSIS — I509 Heart failure, unspecified: Secondary | ICD-10-CM

## 2019-11-25 MED ORDER — SACUBITRIL-VALSARTAN 24-26 MG PO TABS: 1.0000 | ORAL_TABLET | Freq: Two times a day (BID) | ORAL | 1 refills | Status: DC

## 2019-11-25 NOTE — Patient Instructions (Addendum)
Medication Instructions:   STOP Losartan  START Entresto 24-26 mg---1 tablet twice daily.  CONTINUE Furosemide (Lasix) 120 mg in the morning and 80 mg in the evening for three days after starting Entresto. Then, decrease to 80 mg twice daily.  *If you need a refill on your cardiac medications before your next appointment, please call your pharmacy*  Lab Work: Your physician recommends that you return for lab work today: BMET and BNP  If you have labs (blood work) drawn today and your tests are completely normal, you will receive your results only by: Marland Kitchen. MyChart Message (if you have MyChart) OR . A paper copy in the mail If you have any lab test that is abnormal or we need to change your treatment, we will call you to review the results.   Follow-Up: At [facility], you and your health needs are our priority.  As part of our continuing mission to provide you with exceptional heart care, we have created designated Provider Care Teams.  These Care Teams include your primary Cardiologist (physician) and Advanced Practice Providers (APPs -  Physician Assistants and Nurse Practitioners) who all work together to provide you with the care you need, when you need it.  Your next appointment:   You have been scheduled for an IN OFFICE follow-up appointment on Thursday, 12/09/19 at 8:00 AM with Micah FlesherAngela Keyera Hattabaugh, PA-C.  You have been referred to the [facility]. They will call you to schedule an appointment.   Other Instructions  Your physician has requested that you regularly monitor and record your blood pressure readings, heart rate, and weight at home. Please use the same machine at the same time of day to check your readings and record them to bring to your follow-up visit.   PLEASE TAKE ALL BP MEDICATIONS PRIOR TO HIS NEXT VISIT    Low-Sodium Eating Plan Sodium, which is an element that makes up salt, helps you maintain a healthy balance of fluids in your body. Too much sodium  can increase your blood pressure and cause fluid and waste to be held in your body. Your health care provider or dietitian may recommend following this plan if you have high blood pressure (hypertension), kidney disease, liver disease, or heart failure. Eating less sodium can help lower your blood pressure, reduce swelling, and protect your heart, liver, and kidneys. What are tips for following this plan? General guidelines  Most people on this plan should limit their sodium intake to 1,500-2,000 mg (milligrams) of sodium each day. Reading food labels   The Nutrition Facts label lists the amount of sodium in one serving of the food. If you eat more than one serving, you must multiply the listed amount of sodium by the number of servings.  Choose foods with less than 140 mg of sodium per serving.  Avoid foods with 300 mg of sodium or more per serving. Shopping  Look for lower-sodium products, often labeled as "low-sodium" or "no salt added."  Always check the sodium content even if foods are labeled as "unsalted" or "no salt added".  Buy fresh foods. ? Avoid canned foods and premade or frozen meals. ? Avoid canned, cured, or processed meats  Buy breads that have less than 80 mg of sodium per slice. Cooking  Eat more home-cooked food and less restaurant, buffet, and fast food.  Avoid adding salt when cooking. Use salt-free seasonings or herbs instead of table salt or sea salt. Check with your health care provider or pharmacist before using  salt substitutes.  Cook with plant-based oils, such as canola, sunflower, or olive oil. Meal planning  When eating at a restaurant, ask that your food be prepared with less salt or no salt, if possible.  Avoid foods that contain MSG (monosodium glutamate). MSG is sometimes added to [place] food, bouillon, and some canned foods. What foods are recommended? The items listed may not be a complete list. Talk with your dietitian about what dietary  choices are best for you. Grains Low-sodium cereals, including oats, puffed wheat and rice, and shredded wheat. Low-sodium crackers. Unsalted rice. Unsalted pasta. Low-sodium bread. Whole-grain breads and whole-grain pasta. Vegetables Fresh or frozen vegetables. "No salt added" canned vegetables. "No salt added" tomato sauce and paste. Low-sodium or reduced-sodium tomato and vegetable juice. Fruits Fresh, frozen, or canned fruit. Fruit juice. Meats and other protein foods Fresh or frozen (no salt added) meat, poultry, seafood, and fish. Low-sodium canned tuna and salmon. Unsalted nuts. Dried peas, beans, and lentils without added salt. Unsalted canned beans. Eggs. Unsalted nut butters. Dairy Milk. Soy milk. Cheese that is naturally low in sodium, such as ricotta cheese, fresh mozzarella, or Swiss cheese Low-sodium or reduced-sodium cheese. Cream cheese. Yogurt. Fats and oils Unsalted butter. Unsalted margarine with no trans fat. Vegetable oils such as canola or olive oils. Seasonings and other foods Fresh and dried herbs and spices. Salt-free seasonings. Low-sodium mustard and ketchup. Sodium-free salad dressing. Sodium-free light mayonnaise. Fresh or refrigerated horseradish. Lemon juice. Vinegar. Homemade, reduced-sodium, or low-sodium soups. Unsalted popcorn and pretzels. Low-salt or salt-free chips. What foods are not recommended? The items listed may not be a complete list. Talk with your dietitian about what dietary choices are best for you. Grains Instant hot cereals. Bread stuffing, pancake, and biscuit mixes. Croutons. Seasoned rice or pasta mixes. Noodle soup cups. Boxed or frozen macaroni and cheese. Regular salted crackers. Self-rising flour. Vegetables Sauerkraut, pickled vegetables, and relishes. Olives. [place] fries. Onion rings. Regular canned vegetables (not low-sodium or reduced-sodium). Regular canned tomato sauce and paste (not low-sodium or reduced-sodium). Regular tomato and  vegetable juice (not low-sodium or reduced-sodium). Frozen vegetables in sauces. Meats and other protein foods Meat or fish that is salted, canned, smoked, spiced, or pickled. Bacon, ham, sausage, hotdogs, corned beef, chipped beef, packaged lunch meats, salt pork, jerky, pickled herring, anchovies, regular canned tuna, sardines, salted nuts. Dairy Processed cheese and cheese spreads. Cheese curds. Blue cheese. Feta cheese. String cheese. Regular cottage cheese. Buttermilk. Canned milk. Fats and oils Salted butter. Regular margarine. Ghee. Bacon fat. Seasonings and other foods Onion salt, garlic salt, seasoned salt, table salt, and sea salt. Canned and packaged gravies. Worcestershire sauce. Tartar sauce. Barbecue sauce. Teriyaki sauce. Soy sauce, including reduced-sodium. Steak sauce. Fish sauce. Oyster sauce. Cocktail sauce. Horseradish that you find on the shelf. Regular ketchup and mustard. Meat flavorings and tenderizers. Bouillon cubes. Hot sauce and Tabasco sauce. Premade or packaged marinades. Premade or packaged taco seasonings. Relishes. Regular salad dressings. Salsa. Potato and tortilla chips. Corn chips and puffs. Salted popcorn and pretzels. Canned or dried soups. Pizza. Frozen entrees and pot pies. Summary  Eating less sodium can help lower your blood pressure, reduce swelling, and protect your heart, liver, and kidneys.  Most people on this plan should limit their sodium intake to 1,500-2,000 mg (milligrams) of sodium each day.  Canned, boxed, and frozen foods are high in sodium. Restaurant foods, fast foods, and pizza are also very high in sodium. You also get sodium by adding salt to food.  Try to cook at home, eat more fresh fruits and vegetables, and eat less fast food, canned, processed, or prepared foods. This information is not intended to replace advice given to you by your health care provider. Make sure you discuss any questions you have with your health care provider.  Document Released: 05/31/2002 Document Revised: 11/21/2017 Document Reviewed: 12/02/2016 Elsevier Patient Education  2020 [place].  ____________________________________________________________________________________________    Heart Failure Action Plan A heart failure action plan helps you understand what to do when you have symptoms of heart failure. Follow the plan that was created by you and your health care provider. Review your plan each time you visit your health care provider. Red zone These signs and symptoms mean you should get medical help right away:  You have trouble breathing when resting.  You have a dry cough that is getting worse.  You have swelling or pain in your legs or abdomen that is getting worse.  You suddenly gain more than 2-3 lb (0.9-1.4 kg) in a day, or more than 5 lb (2.3 kg) in one week. This amount may be more or less depending on your condition.  You have trouble staying awake or you feel confused.  You have chest pain.  You do not have an appetite.  You pass out. If you experience any of these symptoms:  Call your local emergency services (911 in the [place] right away or seek help at the emergency department of the nearest hospital. Yellow zone These signs and symptoms mean your condition may be getting worse and you should make some changes:  You have trouble breathing when you are active or you need to sleep with extra pillows.  You have swelling in your legs or abdomen.  You gain 2-3 lb (0.9-1.4 kg) in one day, or 5 lb (2.3 kg) in one week. This amount may be more or less depending on your condition.  You get tired easily.  You have trouble sleeping.  You have a dry cough. If you experience any of these symptoms:  Contact your health care provider within the next day.  Your health care provider may adjust your medicines. Green zone These signs mean you are doing well and can continue what you are doing:  You do not have  shortness of breath.  You have very little swelling or no new swelling.  Your weight is stable (no gain or loss).  You have a normal activity level.  You do not have chest pain or any other new symptoms. Follow these instructions at home:  Take over-the-counter and prescription medicines only as told by your health care provider.  Weigh yourself daily. Your target weight is ____250______ lb. ? Call your health care provider if you gain more than ___3_______ lbs in a day, or more than ___5_______ lbs in one week.  Eat a heart-healthy diet. Work with a diet and nutrition specialist (dietitian) to create an eating plan that is best for you.  Keep all follow-up visits as told by your health care provider. This is important. Where to find more information  American Heart Association: www.heart.org Summary  Follow the action plan that was created by you and your health care provider.  Get help right away if you have any symptoms in the Red zone. This information is not intended to replace advice given to you by your health care provider. Make sure you discuss any questions you have with your health care provider. Document Released: 01/18/2017 Document Revised: 11/21/2017 Document  Reviewed: 01/18/2017 Elsevier Patient Education  2020 [employer].   ____________________________________________________________________________________________________   Hypertension, Adult Hypertension is another name for high blood pressure. High blood pressure forces your heart to work harder to pump blood. This can cause problems over time. There are two numbers in a blood pressure reading. There is a top number (systolic) over a bottom number (diastolic). It is best to have a blood pressure that is below 120/80. Healthy choices can help lower your blood pressure, or you may need medicine to help lower it. What are the causes? The cause of this condition is not known. Some conditions may be related  to high blood pressure. What increases the risk?  Smoking.  Having type 2 diabetes mellitus, high cholesterol, or both.  Not getting enough exercise or physical activity.  Being overweight.  Having too much fat, sugar, calories, or salt (sodium) in your diet.  Drinking too much alcohol.  Having long-term (chronic) kidney disease.  Having a family history of high blood pressure.  Age. Risk increases with age.  Race. You may be at higher risk if you are African American.  Gender. Men are at higher risk than women before age 65. After age 39, women are at higher risk than men.  Having obstructive sleep apnea.  Stress. What are the signs or symptoms?  High blood pressure may not cause symptoms. Very high blood pressure (hypertensive crisis) may cause: ? Headache. ? Feelings of worry or nervousness (anxiety). ? Shortness of breath. ? Nosebleed. ? A feeling of being sick to your stomach (nausea). ? Throwing up (vomiting). ? Changes in how you see. ? Very bad chest pain. ? Seizures. How is this treated?  This condition is treated by making healthy lifestyle changes, such as: ? Eating healthy foods. ? Exercising more. ? Drinking less alcohol.  Your health care provider may prescribe medicine if lifestyle changes are not enough to get your blood pressure under control, and if: ? Your top number is above 130. ? Your bottom number is above 80.  Your personal target blood pressure may vary. Follow these instructions at home: Eating and drinking   If told, follow the DASH eating plan. To follow this plan: ? Fill one half of your plate at each meal with fruits and vegetables. ? Fill one fourth of your plate at each meal with whole grains. Whole grains include whole-wheat pasta, brown rice, and whole-grain bread. ? Eat or drink low-fat dairy products, such as skim milk or low-fat yogurt. ? Fill one fourth of your plate at each meal with low-fat (lean) proteins. Low-fat  proteins include fish, chicken without skin, eggs, beans, and tofu. ? Avoid fatty meat, cured and processed meat, or chicken with skin. ? Avoid pre-made or processed food.  Eat less than 1,500 mg of salt each day.  Do not drink alcohol if: ? Your doctor tells you not to drink. ? You are pregnant, may be pregnant, or are planning to become pregnant.  If you drink alcohol: ? Limit how much you use to:  0-1 drink a day for women.  0-2 drinks a day for men. ? Be aware of how much alcohol is in your drink. In the [place] one drink equals one 12 oz bottle of beer (355 mL), one 5 oz glass of wine (148 mL), or one 1 oz glass of hard liquor (44 mL). Lifestyle   Work with your doctor to stay at a healthy weight or to lose weight. Ask your doctor what  the best weight is for you.  Get at least 30 minutes of exercise most days of the week. This may include walking, swimming, or biking.  Get at least 30 minutes of exercise that strengthens your muscles (resistance exercise) at least 3 days a week. This may include lifting weights or doing Pilates.  Do not use any products that contain nicotine or tobacco, such as cigarettes, e-cigarettes, and chewing tobacco. If you need help quitting, ask your doctor.  Check your blood pressure at home as told by your doctor.  Keep all follow-up visits as told by your doctor. This is important. Medicines  Take over-the-counter and prescription medicines only as told by your doctor. Follow directions carefully.  Do not skip doses of blood pressure medicine. The medicine does not work as well if you skip doses. Skipping doses also puts you at risk for problems.  Ask your doctor about side effects or reactions to medicines that you should watch for. Contact a doctor if you:  Think you are having a reaction to the medicine you are taking.  Have headaches that keep coming back (recurring).  Feel dizzy.  Have swelling in your ankles.  Have trouble with  your vision. Get help right away if you:  Get a very bad headache.  Start to feel mixed up (confused).  Feel weak or numb.  Feel faint.  Have very bad pain in your: ? Chest. ? Belly (abdomen).  Throw up more than once.  Have trouble breathing. Summary  Hypertension is another name for high blood pressure.  High blood pressure forces your heart to work harder to pump blood.  For most people, a normal blood pressure is less than 120/80.  Making healthy choices can help lower blood pressure. If your blood pressure does not get lower with healthy choices, you may need to take medicine. This information is not intended to replace advice given to you by your health care provider. Make sure you discuss any questions you have with your health care provider. Document Released: 05/27/2008 Document Revised: 08/19/2018 Document Reviewed: 08/19/2018 Elsevier Patient Education  2020 [employer]   _______________________________________________________________________________________________   How to Take Your Blood Pressure You can take your blood pressure at home with a machine. You may need to check your blood pressure at home:  To check if you have high blood pressure (hypertension).  To check your blood pressure over time.  To make sure your blood pressure medicine is working. Supplies needed: You will need a blood pressure machine, or monitor. You can buy one at a drugstore or online. When choosing one:  Choose one with an arm cuff.  Choose one that wraps around your upper arm. Only one finger should fit between your arm and the cuff.  Do not choose one that measures your blood pressure from your wrist or finger. Your doctor can suggest a monitor. How to prepare Avoid these things for 30 minutes before checking your blood pressure:  Drinking caffeine.  Drinking alcohol.  Eating.  Smoking.  Exercising. Five minutes before checking your blood pressure:  Pee.   Sit in a dining chair. Avoid sitting in a soft couch or armchair.  Be quiet. Do not talk. How to take your blood pressure Follow the instructions that came with your machine. If you have a digital blood pressure monitor, these may be the instructions: 1. Sit up straight. 2. Place your feet on the floor. Do not cross your ankles or legs. 3. Rest your left arm  at the level of your heart. You may rest it on a table, desk, or chair. 4. Pull up your shirt sleeve. 5. Wrap the blood pressure cuff around the upper part of your left arm. The cuff should be 1 inch (2.5 cm) above your elbow. It is best to wrap the cuff around bare skin. 6. Fit the cuff snugly around your arm. You should be able to place only one finger between the cuff and your arm. 7. Put the cord inside the groove of your elbow. 8. Press the power button. 9. Sit quietly while the cuff fills with air and loses air. 10. Write down the numbers on the screen. 11. Wait 2-3 minutes and then repeat steps 1-10. What do the numbers mean? Two numbers make up your blood pressure. The first number is called systolic pressure. The second is called diastolic pressure. An example of a blood pressure reading is "120 over 80" (or 120/80). If you are an adult and do not have a medical condition, use this guide to find out if your blood pressure is normal: Normal  First number: below 120.  Second number: below 80. Elevated  First number: 120-129.  Second number: below 80. Hypertension stage 1  First number: 130-139.  Second number: 80-89. Hypertension stage 2  First number: 140 or above.  Second number: 90 or above. Your blood pressure is above normal even if only the top or bottom number is above normal. Follow these instructions at home:  Check your blood pressure as often as your doctor tells you to.  Take your monitor to your next doctor's appointment. Your doctor will: ? Make sure you are using it correctly. ? Make sure it  is working right.  Make sure you understand what your blood pressure numbers should be.  Tell your doctor if your medicines are causing side effects. Contact a doctor if:  Your blood pressure keeps being high. Get help right away if:  Your first blood pressure number is higher than 180.  Your second blood pressure number is higher than 120. This information is not intended to replace advice given to you by your health care provider. Make sure you discuss any questions you have with your health care provider. Document Released: 11/21/2008 Document Revised: 11/21/2017 Document Reviewed: 05/17/2016 Elsevier Patient Education  2020 [employer].

## 2019-11-25 NOTE — Telephone Encounter (Signed)
Pt saw Fabian Sharp, PA today

## 2019-11-26 LAB — BASIC METABOLIC PANEL
BUN/Creatinine Ratio: 8 — ABNORMAL LOW (ref 9–20)
BUN: 8 mg/dL (ref 6–24)
CO2: 27 mmol/L (ref 20–29)
Calcium: 8.6 mg/dL — ABNORMAL LOW (ref 8.7–10.2)
Chloride: 99 mmol/L (ref 96–106)
Creatinine, Ser: 1.03 mg/dL (ref 0.76–1.27)
GFR calc Af Amer: 100 mL/min/{1.73_m2} (ref >59)
GFR calc non Af Amer: 86 mL/min/{1.73_m2} (ref >59)
Glucose: 91 mg/dL (ref 65–99)
Potassium: 3.5 mmol/L (ref 3.5–5.2)
Sodium: 143 mmol/L (ref 134–144)

## 2019-11-26 LAB — BRAIN NATRIURETIC PEPTIDE

## 2019-12-08 NOTE — Progress Notes (Deleted)
{Choose 1 Note Type (Telehealth Visit or Telephone Visit):[id number]}   Date:  12/08/2019   ID:  Colin Walton, DOB 22-Sep-1971, MRN [id number]  {Patient Location:[phone]::"Home"} {Provider Location:[phone]::"Home"}  PCP:  Seward Carol, MD  Cardiologist:  Lauree Chandler, MD *** Electrophysiologist:  None   Evaluation Performed:  {Choose Visit Type:[phone]::"Follow-Up Visit"}  Chief Complaint:  ***  History of Present Illness:    Colin Walton is a 48 y.o. male we are following for ongoing assessment and management of hypertension, chronic systolic heart failure, moderate aortic insufficiency, and nonischemic cardiomyopathy.  Mr. Canada also has a history of OSA on CPAP, followed by Dr. Radford Pax, and alcohol abuse.  Most recent echocardiogram dated 11/22/2019 revealed a significantly reduced ejection fraction of 30% to 35%, he also was found to have moderate to severe aortic valve regurgitation.  He was seen last in the office on 11/25/2019 by Fabian Sharp, PA, and had been taking higher doses of Lasix 120 mg in the a.m. and 80 mg in the p.m. for 3 days prior to being seen.  He was referred to AHF clinic, he was found to have continued volume overload with lower extremity edema and JVD.  He was advised to weigh himself daily and take his blood pressure.  He had trouble using his blood pressure machine and was asked to bring it with him when he had another in person visit.  A BN P and a BMET was ordered.  He was also to have planned TEE to better visualize aortic valve once he was closer to euvolemic status.  [name], [place], reported that his weight was 252.8 pounds in the office which was up approximately 5 pounds since prior weight on 11/11/2018). He was to transition to [facility] from [facility].   The patient {does/does not:200015} have symptoms concerning for COVID-19 infection (fever, chills, cough, or new shortness of breath).    Past Medical History:  Diagnosis Date    . Chronic systolic CHF (congestive heart failure) ([place])   . Hyperlipidemia   . Hypertension   . Mild mitral regurgitation 2016  . NICM (nonischemic cardiomyopathy) ([place])   . Obesity   . Sleep apnea 12/24/12   Sleep study February 2014   Past Surgical History:  Procedure Laterality Date  . VASECTOMY       No outpatient medications have been marked as taking for the 12/09/19 encounter (Appointment) with Lendon Colonel, NP.     Allergies:   Patient has no known allergies.   Social History   Tobacco Use  . Smoking status: Light Tobacco Smoker    Packs/day: 0.05    Years: 22.00    Pack years: 1.10    Types: Cigarettes  . Smokeless tobacco: Never Used  Substance Use Topics  . Alcohol use: No    Alcohol/week: 30.0 standard drinks    Types: 30 Standard drinks or equivalent per week    Comment: x 1 week  . Drug use: No    Types: Marijuana     Family Hx: The patient's family history includes Arrhythmia in his maternal grandfather. There is no history of CAD.  ROS:   Please see the history of present illness.    *** All other systems reviewed and are negative.   Prior CV studies:   The following studies were reviewed today:  ***  Labs/Other Tests and Data Reviewed:    EKG:  {EKG/Telemetry Strips Reviewed:[id number]}  Recent Labs: 11/22/2019: ALT 12; Hemoglobin 15.6; NT-Pro BNP 1,711; Platelets 267; TSH  1.700 11/25/2019: BNP CANCELED; BUN 8; Creatinine, Ser 1.03; Potassium 3.5; Sodium 143   Recent Lipid Panel Lab Results  Component Value Date/Time   CHOL 125 11/22/2019 09:07 AM   TRIG 106 11/22/2019 09:07 AM   HDL 32 (L) 11/22/2019 09:07 AM   CHOLHDL 3.9 11/22/2019 09:07 AM   CHOLHDL 4.3 12/23/2012 05:15 AM   LDLCALC 73 11/22/2019 09:07 AM    Wt Readings from Last 3 Encounters:  11/25/19 252 lb 12.8 oz (114.7 kg)  11/10/19 238 lb (108 kg)  11/18/18 247 lb 12.8 oz (112.4 kg)     Objective:    Vital Signs:  There were no vitals taken for this visit.    {HeartCare Virtual Exam (Optional):[phone]::"VITAL SIGNS:  reviewed"}  ASSESSMENT & PLAN:    1. ***  COVID-19 Education: The signs and symptoms of COVID-19 were discussed with the patient and how to seek care for testing (follow up with PCP or arrange E-visit).  ***The importance of social distancing was discussed today.  Time:   Today, I have spent *** minutes with the patient with telehealth technology discussing the above problems.     Medication Adjustments/Labs and Tests Ordered: Current medicines are reviewed at length with the patient today.  Concerns regarding medicines are outlined above.   Tests Ordered: No orders of the defined types were placed in this encounter.   Medication Changes: No orders of the defined types were placed in this encounter.   Disposition:  Follow up {follow up:15908}  Signed, Bettey Mare. Liborio Nixon, ANP, AACC  12/08/2019 2:18 PM    [facility]

## 2019-12-09 ENCOUNTER — Telehealth: Payer: Managed Care, Other (non HMO) | Admitting: Adult Health

## 2019-12-09 ENCOUNTER — Telehealth: Payer: Managed Care, Other (non HMO) | Admitting: Physician Assistant

## 2019-12-10 ENCOUNTER — Telehealth (HOSPITAL_COMMUNITY): Payer: Self-pay | Admitting: Vascular Surgery

## 2019-12-10 NOTE — Telephone Encounter (Signed)
Left pt VM giving new pt appt w/ db 01/06/20, asked pt to cal back to confirm appt, will send New pt packet

## 2019-12-12 NOTE — Progress Notes (Deleted)
Cardiology Office Note   Date:  12/12/2019   ID:  Colin Walton, DOB 11-16-1971, MRN [id number]  PCP:  Seward Carol, MD  Cardiologist: Dr. Angelena Form No chief complaint on file.    History of Present Illness: Colin Walton is a 48 y.o. male who presents for ongoing assessment and management of chronic systolic heart failure, nonischemic cardiomyopathy, hypertension, moderate aortic insufficiency, with other history to include OSA on CPAP (followed by Dr. Golden Hurter), along with EtOH abuse.  Most recent echocardiogram on 11/22/2019 revealed an EF of 30% to 35% with mild LVH, mildly dilated left atrium, moderate aortic valve regurgitation, elevated pulmonary pressure.  TEE was recommended to better evaluate the aortic valve.  This is not yet been completed.  He was seen last in the office by Fabian Sharp, PA, as a follow-up due to fluid overload noted on previous visit.  At that time the patient's Lasix was increased to 120 mg in the a.m. and 80 mg in the p.m.  The patient noticed improvement in his swelling on follow-up.    At that time he had not taken any of his antihypertensive medications and was found to be hypertensive.  He was to bring his blood pressure machine with him on this office visit as he was unable to get it working for him at home.  He was also given instructions on low-sodium diet.  He was started on Entresto 24/26 mg twice daily on 11/25/2019.  He was to continue Lasix 120 in the morning and 80 mg for 3 more days and then decrease to 80 mg twice daily.  He was recommended for a HF clinic referral.  Follow-up BMP and BNP were ordered.  He was advised to weigh daily.  TEE was ordered but not yet completed.  Labs on 11/26/2019 revealed normal kidney function with a creatinine of 1.03.  Potassium 3.5.  Follow-up labs to be completed today to include a BNP.   Past Medical History:  Diagnosis Date  . Chronic systolic CHF (congestive heart failure) ([place])   .  Hyperlipidemia   . Hypertension   . Mild mitral regurgitation 2016  . NICM (nonischemic cardiomyopathy) ([place])   . Obesity   . Sleep apnea 12/24/12   Sleep study February 2014    Past Surgical History:  Procedure Laterality Date  . VASECTOMY       Current Outpatient Medications  Medication Sig Dispense Refill  . amLODipine (NORVASC) 10 MG tablet Take 1 tablet (10 mg total) by mouth daily. 30 tablet 3  . atorvastatin (LIPITOR) 10 MG tablet Take 1 tablet (10 mg total) by mouth daily. 30 tablet 3  . carvedilol (COREG) 25 MG tablet Take 1 tablet (25 mg total) by mouth 2 (two) times daily. 60 tablet 3  . colchicine 0.6 MG tablet Take 2 tablets at first sign of gout flare and then 1 tablet an hour later.  Repeat every 3 days until gout flare subsides. 12 tablet 0  . furosemide (LASIX) 80 MG tablet Take 1 tablet (80 mg total) by mouth 2 (two) times daily. 60 tablet 3  . hydrALAZINE (APRESOLINE) 50 MG tablet Take 1 tablet (50 mg total) by mouth 2 (two) times daily. 60 tablet 3  . potassium chloride 20 MEQ TBCR Take 40 mEq by mouth daily. 90 tablet 3  . sacubitril-valsartan (ENTRESTO) 24-26 MG Take 1 tablet by mouth 2 (two) times daily. 60 tablet 1   No current facility-administered medications for this visit.  Allergies:   Patient has no known allergies.    Social History:  The patient  reports that he has been smoking cigarettes. He has a 1.10 pack-year smoking history. He has never used smokeless tobacco. He reports that he does not drink alcohol or use drugs.   Family History:  The patient's family history includes Arrhythmia in his maternal grandfather.    ROS: All other systems are reviewed and negative. Unless otherwise mentioned in H&P    PHYSICAL EXAM: VS:  There were no vitals taken for this visit. , BMI There is no height or weight on file to calculate BMI. GEN: Well nourished, well developed, in no acute distress HEENT: normal Neck: no JVD, carotid bruits, or  masses Cardiac: ***RRR; no murmurs, rubs, or gallops,no edema  Respiratory:  Clear to auscultation bilaterally, normal work of breathing GI: soft, nontender, nondistended, + BS MS: no deformity or atrophy Skin: warm and dry, no rash Neuro:  Strength and sensation are intact Psych: euthymic mood, full affect   EKG:  EKG {ACTION; IS/IS VOJ:[id number]} ordered today. The ekg ordered today demonstrates ***   Recent Labs: 12/19/2019: ALT 12; Hemoglobin 15.6; NT-Pro BNP 1,711; Platelets 267; TSH 1.700 11/25/2019: BNP CANCELED; BUN 8; Creatinine, Ser 1.03; Potassium 3.5; Sodium 143    Lipid Panel    Component Value Date/Time   CHOL 125 December 19, 2019 0907   TRIG 106 Dec 19, 2019 0907   HDL 32 (L) 12/19/19 0907   CHOLHDL 3.9 19-Dec-2019 0907   CHOLHDL 4.3 12/23/2012 0515   VLDL 39 12/23/2012 0515   LDLCALC 73 12/19/19 0907      Wt Readings from Last 3 Encounters:  11/25/19 252 lb 12.8 oz (114.7 kg)  11/10/19 238 lb (108 kg)  11/18/18 247 lb 12.8 oz (112.4 kg)      Other studies Reviewed: Echocardiogram Dec 19, 2019 1. Left ventricular ejection fraction, by visual estimation, is 30 to 35%. The left ventricle has severely decreased function. There is mildly increased left ventricular hypertrophy.  2. Multiple segmental abnormalities exist. See findings.  3. Mildly dilated left ventricular internal cavity size.  4. Global right ventricle has normal systolic function.The right ventricular size is normal. No increase in right ventricular wall thickness.  5. Left atrial size was mildly dilated.  6. Right atrial size was normal.  7. The mitral valve is normal in structure. Trace mitral valve regurgitation. No evidence of mitral stenosis.  8. The tricuspid valve is normal in structure. Tricuspid valve regurgitation is mild.  9. Aortic regurgitation PHT measures 253 msec. 10. Aortic valve regurgitation is moderate to severe. 11. The aortic valve is grossly normal. Aortic valve regurgitation  is moderate to severe. No evidence of aortic valve sclerosis or stenosis. 12. The pulmonic valve was normal in structure. Pulmonic valve regurgitation is mild. 13. Moderately elevated pulmonary artery systolic pressure. 14. The inferior vena cava is dilated in size with <50% respiratory variability, suggesting right atrial pressure of 15 mmHg. 15. Prior echo (trace aortic regurgitation). Now moderate to severe. Consider TEE for clarification.  ASSESSMENT AND PLAN:  1.  ***   Current medicines are reviewed at length with the patient today.    Labs/ tests ordered today include: *** Bettey Mare. Liborio Nixon, ANP, AACC   12/12/2019 11:53 AM    [facility] [address] Office [phone] Fax [phone]  Notice: This dictation was prepared with Dragon dictation along with smaller phrase technology. Any transcriptional errors that result from this process are unintentional  and may not be corrected upon review.

## 2019-12-13 ENCOUNTER — Ambulatory Visit: Payer: Managed Care, Other (non HMO) | Admitting: Adult Health

## 2019-12-13 NOTE — Progress Notes (Signed)
Cardiology Office Note   Date:  12/14/2019   ID:  [name], DOB 1971/05/20, MRN [id number]  PCP:  Renford Dills, MD  Cardiologist:  Dr. Clifton James  :SystolicCHF Follow Up   History of Present Illness: Colin Walton is a 48 y.o. male who presents for presents for ongoing assessment and management of chronic systolic heart failure, nonischemic cardiomyopathy, hypertension, moderate aortic insufficiency, with other history to include OSA on CPAP (followed by Dr. Carolanne Grumbling), along with EtOH abuse.  Most recent echocardiogram on 11/22/2019 revealed an EF of 30% to 35% with mild LVH, mildly dilated left atrium, moderate aortic valve regurgitation, elevated pulmonary pressure.  TEE was recommended to better evaluate the aortic valve.  This is not yet been completed.  He was seen last in the office by Micah Flesher, PA, as a follow-up due to fluid overload noted on previous visit.  At that time the patient's Lasix was increased to 120 mg in the a.m. and 80 mg in the p.m.  The patient noticed improvement in his swelling on follow-up.    At that time he had not taken any of his antihypertensive medications and was found to be hypertensive.  He was to bring his blood pressure machine with him on this office visit as he was unable to get it working for him at home.  He was also given instructions on low-sodium diet.  He was started on Entresto 24/26 mg twice daily on 11/25/2019.  He was to continue Lasix 120 in the morning and 80 mg for 3 more days and then decrease to 80 mg twice daily.  He was recommended for a HF clinic referral.  Follow-up BMP and BNP were ordered.  He was advised to weigh daily.  TEE was ordered but not yet completed.  Labs on 11/26/2019 revealed normal kidney function with a creatinine of 1.03.  Potassium 3.5.  Follow-up labs to be completed today to include a BNP.   He comes today having gained 6 lbs, reporting that the lasix is not working as well. He cannot afford  Entresto and therefore has not been taking it. He admits to eating some salted foods.  BP is not optimal for HFrEF.    Past Medical History:  Diagnosis Date  . Chronic systolic CHF (congestive heart failure) ([facility])   . Hyperlipidemia   . Hypertension   . Mild mitral regurgitation 2016  . NICM (nonischemic cardiomyopathy) ([facility])   . Obesity   . Sleep apnea 12/24/12   Sleep study February 2014    Past Surgical History:  Procedure Laterality Date  . VASECTOMY       Current Outpatient Medications  Medication Sig Dispense Refill  . amLODipine (NORVASC) 10 MG tablet Take 1 tablet (10 mg total) by mouth daily. 30 tablet 3  . atorvastatin (LIPITOR) 10 MG tablet Take 1 tablet (10 mg total) by mouth daily. 30 tablet 3  . carvedilol (COREG) 25 MG tablet Take 1 tablet (25 mg total) by mouth 2 (two) times daily. 60 tablet 3  . colchicine 0.6 MG tablet Take 2 tablets at first sign of gout flare and then 1 tablet an hour later.  Repeat every 3 days until gout flare subsides. 12 tablet 0  . furosemide (LASIX) 80 MG tablet Take 1 tablet (80 mg total) by mouth 2 (two) times daily. 60 tablet 3  . hydrALAZINE (APRESOLINE) 50 MG tablet Take 1 tablet (50 mg total) by mouth 2 (two) times daily. 60 tablet 3  .  potassium chloride 20 MEQ TBCR Take 40 mEq by mouth daily. 90 tablet 3  . sacubitril-valsartan (ENTRESTO) 24-26 MG Take 1 tablet by mouth 2 (two) times daily. 60 tablet 1   No current facility-administered medications for this visit.    Allergies:   Patient has no known allergies.    Social History:  The patient  reports that he has been smoking cigarettes. He has a 1.10 pack-year smoking history. He has never used smokeless tobacco. He reports that he does not drink alcohol or use drugs.   Family History:  The patient's family history includes Arrhythmia in his maternal grandfather.    ROS: All other systems are reviewed and negative. Unless otherwise mentioned in H&P    PHYSICAL EXAM: VS:   BP 136/73   Pulse 94   Ht 5\' 7"  (1.702 m)   Wt 260 lb (117.9 kg)   SpO2 99%   BMI 40.72 kg/m  , BMI Body mass index is 40.72 kg/m. GEN: Well nourished, well developed, in no acute distress HEENT: normal Neck: no JVD, carotid bruits, or masses Cardiac: RRR, tachycardic,; no murmurs, rubs, or gallops, 2+ edema , Wearing support hose. Respiratory:  Clear to auscultation bilaterally, normal work of breathing, some orthopnea when lying down on exam table. GI: soft, nontender, distended, + BS MS: no deformity or atrophy Skin: warm and dry, no rash Neuro:  Strength and sensation are intact Psych: euthymic mood, full affect   EKG: Not completed this office visit.   Recent Labs: 11/27/2019: ALT 12; Hemoglobin 15.6; NT-Pro BNP 1,711; Platelets 267; TSH 1.700 11/25/2019: BNP CANCELED; BUN 8; Creatinine, Ser 1.03; Potassium 3.5; Sodium 143    Lipid Panel    Component Value Date/Time   CHOL 125 Nov 27, 2019 0907   TRIG 106 November 27, 2019 0907   HDL 32 (L) Nov 27, 2019 0907   CHOLHDL 3.9 27-Nov-2019 0907   CHOLHDL 4.3 12/23/2012 0515   VLDL 39 12/23/2012 0515   LDLCALC 73 11/27/19 0907      Wt Readings from Last 3 Encounters:  12/14/19 260 lb (117.9 kg)  11/25/19 252 lb 12.8 oz (114.7 kg)  11/10/19 238 lb (108 kg)      Other studies Reviewed: Echocardiogram 11/27/19 1. Left ventricular ejection fraction, by visual estimation, is 30 to 35%. The left ventricle has severely decreased function. There is mildly increased left ventricular hypertrophy.  2. Multiple segmental abnormalities exist. See findings.  3. Mildly dilated left ventricular internal cavity size.  4. Global right ventricle has normal systolic function.The right ventricular size is normal. No increase in right ventricular wall thickness.  5. Left atrial size was mildly dilated.  6. Right atrial size was normal.  7. The mitral valve is normal in structure. Trace mitral valve regurgitation. No evidence of mitral  stenosis.  8. The tricuspid valve is normal in structure. Tricuspid valve regurgitation is mild.  9. Aortic regurgitation PHT measures 253 msec. 10. Aortic valve regurgitation is moderate to severe. 11. The aortic valve is grossly normal. Aortic valve regurgitation is moderate to severe. No evidence of aortic valve sclerosis or stenosis. 12. The pulmonic valve was normal in structure. Pulmonic valve regurgitation is mild. 13. Moderately elevated pulmonary artery systolic pressure. 14. The inferior vena cava is dilated in size with <50% respiratory variability, suggesting right atrial pressure of 15 mmHg. 15. Prior echo (trace aortic regurgitation). Now moderate to severe. Consider TEE for clarification.  ASSESSMENT AND PLAN:  1. Acute om chronic systolic CHF: He is having worsening orthopnea,has gain  6 lbs since last being seen, and has eaten some salty foods. He has not taken his Entresto as he cannot afford this. I will change his furosemide to torsemide, take 80 mg in am and 40 mg in pm.  He will have follow up BMET in one week. He is given samples of Entresto and a discount card. He is also given copy of the Salty 6.  He will weight himself daily and record. Bring back these recordings on follow up appointment.   2. Hypertension: Not optimal reading for reduced EF. He has not taken his Entresto. With samples provided I suspect his BP will be better controlled.  Will plant to titrate this on follow up if BP is not well controlled.Consider adding spironolactone to regimine to optimize treatment.   3. Hyperlipidemia: Continue atorvastatin 10 mg daily. Will need follow up lipids and LFT's if not completed by PCP.   Current medicines are reviewed at length with the patient today.    Labs/ tests ordered today include: BMET   Phill Myron. West Pugh, ANP, [facility]   12/14/2019 8:23 AM    [facility] [address] Office [phone] Fax [phone]  Notice: This dictation was prepared with Dragon dictation along with smaller phrase technology. Any transcriptional errors that result from this process are unintentional and may not be corrected upon review.

## 2019-12-14 ENCOUNTER — Other Ambulatory Visit: Payer: Self-pay

## 2019-12-14 ENCOUNTER — Ambulatory Visit: Payer: Managed Care, Other (non HMO) | Admitting: Adult Health

## 2019-12-14 ENCOUNTER — Encounter: Payer: Self-pay | Admitting: Adult Health

## 2019-12-14 VITALS — BP 136/73 | HR 94 | Ht 67.0 in | Wt 260.0 lb

## 2019-12-14 DIAGNOSIS — I10 Essential (primary) hypertension: Secondary | ICD-10-CM | POA: Diagnosis not present

## 2019-12-14 DIAGNOSIS — E7800 Pure hypercholesterolemia, unspecified: Secondary | ICD-10-CM

## 2019-12-14 DIAGNOSIS — Z79899 Other long term (current) drug therapy: Secondary | ICD-10-CM

## 2019-12-14 DIAGNOSIS — E669 Obesity, unspecified: Secondary | ICD-10-CM | POA: Diagnosis not present

## 2019-12-14 DIAGNOSIS — I5042 Chronic combined systolic (congestive) and diastolic (congestive) heart failure: Secondary | ICD-10-CM

## 2019-12-14 MED ORDER — TORSEMIDE 20 MG PO TABS: ORAL_TABLET | ORAL | 6 refills | Status: DC

## 2019-12-14 NOTE — Patient Instructions (Signed)
Medication Instructions:  STOP- Furosemide(Lasix) START- Torsemide take 80 mg(4 tablets) in the morning and 40 mg(2 tablets) in the evening  If you need a refill on your cardiac medications before your next appointment, please call your pharmacy.  Labwork: BMP in 1 week HERE IN OUR OFFICE AT [facility] You will NOT need to fast   If you have labs (blood work) drawn today and your tests are completely normal, you will receive your results only by: Marland Kitchen MyChart Message (if you have MyChart) OR . A paper copy in the mail If you have any lab test that is abnormal or we need to change your treatment, we will call you to review the results.  Testing/Procedures: None Ordered  PLEASE READ AND FOLLOW SALTY 6 ATTACHED  Reduce your risk of getting COVID-19 With your heart disease it is especially important for people at increased risk of severe illness from COVID-19, and those who live with them, to protect themselves from getting COVID-19. The best way to protect yourself and to help reduce the spread of the virus that causes COVID-19 is to: Marland Kitchen Limit your interactions with other people as much as possible. . Take precautions to prevent getting COVID-19 when you do interact with others. If you start feeling sick and think you may have COVID-19, get in touch with your healthcare provider within 24 hours.  Follow-Up: IN 2 Months  In Person [place], [place].    At [facility], you and your health needs are our priority.  As part of our continuing mission to provide you with exceptional heart care, we have created designated Provider Care Teams.  These Care Teams include your primary Cardiologist (physician) and Advanced Practice Providers (APPs -  Physician Assistants and Nurse Practitioners) who all work together to provide you with the care you need, when you need it.  Thank you for choosing [facility] at [facility]!!     Happy Holidays!!

## 2020-01-05 ENCOUNTER — Telehealth: Payer: Self-pay | Admitting: *Deleted

## 2020-01-05 DIAGNOSIS — G4733 Obstructive sleep apnea (adult) (pediatric): Secondary | ICD-10-CM

## 2020-01-05 NOTE — Telephone Encounter (Signed)
Order placed to [facility] for Bipap and supplies.  DME selection is [facility]. Patient understands he will be contacted by [facility] to set up his cpap. Patient understands to call if [facility] does not contact him with new setup in a timely manner. Patient understands they will be called once confirmation has been received from [facility] that they have received their new machine to schedule 10 week follow up appointment.  [facility] notified of new cpap order  Please add to airview Patient was grateful for the call and thanked me.

## 2020-01-05 NOTE — Addendum Note (Signed)
Addended by: Reesa Chew on: 01/05/2020 12:41 PM   Modules accepted: Level of Service, SmartSet

## 2020-01-05 NOTE — Telephone Encounter (Signed)
We have the documentation for a new machine. We can use old sleep study. Can do APAP send settings please.

## 2020-01-05 NOTE — Telephone Encounter (Signed)
  Reesa Chew, CMA  Quintella Reichert, MD  Yes, sleep study in media 02/16/2013 Bipap 14/12 cm H20.

## 2020-01-05 NOTE — Telephone Encounter (Signed)
  Quintella Reichert, MD  Colin Walton, CMA  Please order [employer]Med BiPAP at 14/10cm H2O with heated humidity and mask of choice and supplies   Need download in 2 weeks and followup with me in 10 weeks   Traci

## 2020-01-05 NOTE — Telephone Encounter (Signed)
-----   Message from Quintella Reichert, MD sent at 01/05/2020  9:04 AM EST ----- Was he on BiPAP ----- Message ----- From: Reesa Chew, CMA Sent: 01/05/2020   8:34 AM EST To: Quintella Reichert, MD  We have the documentation for a new machine. We can use old sleep study. Can do APAP send settings please. Thanks ----- Message ----- From: Quintella Reichert, MD Sent: 01/04/2020   1:24 PM EST To: Reesa Chew, CMA  Nina  This patient's split night sleep study was denied.  Does he needs this to get a new device? Traci

## 2020-01-05 NOTE — Telephone Encounter (Signed)
This encounter was created in error - please disregard.

## 2020-01-05 NOTE — Telephone Encounter (Signed)
-----   Message from Quintella Reichert, MD sent at 01/04/2020  1:24 PM EST ----- [name]  This patient's split night sleep study was denied.  Does he needs this to get a new device? Traci

## 2020-01-06 ENCOUNTER — Encounter (HOSPITAL_COMMUNITY): Payer: Managed Care, Other (non HMO) | Admitting: Internal Medicine

## 2020-01-12 ENCOUNTER — Other Ambulatory Visit: Payer: Self-pay

## 2020-01-12 ENCOUNTER — Ambulatory Visit (HOSPITAL_COMMUNITY)
Admission: RE | Admit: 2020-01-12 | Discharge: 2020-01-12 | Disposition: A | Discharge status: Home / Self Care | Payer: Managed Care, Other (non HMO) | Source: Ambulatory Visit | Attending: Internal Medicine | Admitting: Internal Medicine

## 2020-01-12 ENCOUNTER — Encounter (HOSPITAL_COMMUNITY): Payer: Self-pay | Admitting: Internal Medicine

## 2020-01-12 VITALS — BP 139/76 | HR 91 | Wt 253.2 lb

## 2020-01-12 DIAGNOSIS — I509 Heart failure, unspecified: Secondary | ICD-10-CM

## 2020-01-12 DIAGNOSIS — F17210 Nicotine dependence, cigarettes, uncomplicated: Secondary | ICD-10-CM | POA: Insufficient documentation

## 2020-01-12 DIAGNOSIS — Z8249 Family history of ischemic heart disease and other diseases of the circulatory system: Secondary | ICD-10-CM | POA: Diagnosis not present

## 2020-01-12 DIAGNOSIS — I5022 Chronic systolic (congestive) heart failure: Secondary | ICD-10-CM | POA: Diagnosis not present

## 2020-01-12 DIAGNOSIS — Z79899 Other long term (current) drug therapy: Secondary | ICD-10-CM | POA: Diagnosis not present

## 2020-01-12 DIAGNOSIS — I428 Other cardiomyopathies: Secondary | ICD-10-CM | POA: Insufficient documentation

## 2020-01-12 DIAGNOSIS — I351 Nonrheumatic aortic (valve) insufficiency: Secondary | ICD-10-CM

## 2020-01-12 DIAGNOSIS — I080 Rheumatic disorders of both mitral and aortic valves: Secondary | ICD-10-CM | POA: Diagnosis not present

## 2020-01-12 DIAGNOSIS — G4733 Obstructive sleep apnea (adult) (pediatric): Secondary | ICD-10-CM | POA: Diagnosis not present

## 2020-01-12 DIAGNOSIS — I10 Essential (primary) hypertension: Secondary | ICD-10-CM

## 2020-01-12 DIAGNOSIS — E785 Hyperlipidemia, unspecified: Secondary | ICD-10-CM | POA: Diagnosis not present

## 2020-01-12 DIAGNOSIS — I110 Hypertensive heart disease with heart failure: Secondary | ICD-10-CM | POA: Insufficient documentation

## 2020-01-12 DIAGNOSIS — E669 Obesity, unspecified: Secondary | ICD-10-CM

## 2020-01-12 LAB — BASIC METABOLIC PANEL
Anion gap: 13 (ref 5–15)
BUN: 26 mg/dL — ABNORMAL HIGH (ref 6–20)
CO2: 22 mmol/L (ref 22–32)
Calcium: 9 mg/dL (ref 8.9–10.3)
Chloride: 103 mmol/L (ref 98–111)
Creatinine, Ser: 1.88 mg/dL — ABNORMAL HIGH (ref 0.61–1.24)
GFR calc Af Amer: 48 mL/min — ABNORMAL LOW (ref >60)
GFR calc non Af Amer: 42 mL/min — ABNORMAL LOW (ref >60)
Glucose, Bld: 129 mg/dL — ABNORMAL HIGH (ref 70–99)
Potassium: 3.2 mmol/L — ABNORMAL LOW (ref 3.5–5.1)
Sodium: 138 mmol/L (ref 135–145)

## 2020-01-12 LAB — BRAIN NATRIURETIC PEPTIDE: B Natriuretic Peptide: 860.2 pg/mL — ABNORMAL HIGH (ref 0.0–100.0)

## 2020-01-12 MED ORDER — ENTRESTO 49-51 MG PO TABS: 1.0000 | ORAL_TABLET | Freq: Two times a day (BID) | ORAL | 3 refills | Status: DC

## 2020-01-12 MED ORDER — TORSEMIDE 20 MG PO TABS: 40.0000 mg | ORAL_TABLET | Freq: Every day | ORAL | 6 refills | Status: DC

## 2020-01-12 MED ORDER — TORSEMIDE 20 MG PO TABS: 40.0000 mg | ORAL_TABLET | Freq: Two times a day (BID) | ORAL | 6 refills | Status: DC

## 2020-01-12 NOTE — Progress Notes (Signed)
Entresto free 30 day card and copay card given to patient. Patient signed entresto patient assistance application. Application filled out and faxed to [employer].

## 2020-01-12 NOTE — Telephone Encounter (Signed)
DME has changed to [employer] because patient is out of network. 

## 2020-01-12 NOTE — Progress Notes (Addendum)
ADVANCED HF CLINIC CONSULT NOTE  Referring Physician: Primary Care: Primary Cardiologist: Clifton James  HPI:  Colin Walton) is a 49 y.o. male with a hx of morbid obesity, hypertension, chronic systolic heart failure, due to nonischemic cardiomyopathy, moderate aortic insufficiency, OSA on CPAP, and alcohol use. Referred by Dr. Clifton James to establish care in the HF Clinic  He was first diagnosed with CHF in 2013 during a hospitalization. Echo revealed EF of 40-45%. Follow up sleep study revealed severe OSA prompting CPAP therapy (followed by Dr. Mayford Knife). He was also noted to have uncontrolled hypertension. Right and left heart cath 03/2013 with no obstructive CAD and an EF of 55%. Unfortunately, repeat echo 11/2015 revealed and EF of 35-40% and diffuse hypokinesis, trivial AI, and moderately dilated LA.   He was recently seen in follow up by [name] [facility] via a telemedicine visit. He reported DOE and pitting edema, prompting a repeat echocardiogram. Echo on  11/22/19 revealed an EF of 30-35% with LVH, mildly dilated left atrium, moderate aortic valve regurgitation, and elevated pulmonary pressure. Echo was reviewed and TEE was recommended to better evaluate the aortic valve. Given his volume overload on exam with follow up BNP of 1700, his lasix were increased to 120 qAM and 80 qPM. An in-office visit was recommended and he saw [name] last month.   He had been taking 120/80 mg lasix for 3 days and noticed a big improvement in his swelling. Unfortunately, he did not take any of his BP medications this morning prior to going to his visit. He did not want to take lasix prior to an appt, so he didn't take anything. He has a BP cuff at home, but has not used it. He was switched to Entresto 24/26 bid. He subsequently was switched to torsemide 80/40.  Says he is feeling better. Tries to space medicines. Yesterday took them all at one and got very dizzy. Weight stable at 280. No edema, orthopnea  or PND. No CP. Uses CPAP every night but it is an old machine and has a leak. Has been working with [facility] to get a new system.  Has a BP cuff but doesn't remember the numbers well. Thinks mostly around 130s. Smokes 8 cigarettes/day. Drinks 2 glasses of tequilia 2 days per week. 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. Gets SOB if he walks too far or goes up steps   Review of Systems: [y] = yes, [ ]  = no   General: Weight gain [ ] ; Weight loss [ ] ; Anorexia [ ] ; Fatigue ]; Fever [ ] ; Chills [ ] ; Weakness [ ]   Cardiac: Chest pain/pressure [ ] ; Resting SOB [ ] ; Exertional SOB [ y]; Orthopnea [ ] ; Pedal Edema [ ] ; Palpitations [ ] ; Syncope [ ] ; Presyncope [ ] ; Paroxysmal nocturnal dyspnea[ ]   Pulmonary: Cough [ ] ; Wheezing[ ] ; Hemoptysis[ ] ; Sputum [ ] ; Snoring [ ]   GI: Vomiting[ ] ; Dysphagia[ ] ; Melena[ ] ; Hematochezia [ ] ; Heartburn[ ] ; Abdominal pain [ ] ; Constipation [ ] ; Diarrhea [ ] ; BRBPR [ ]   GU: Hematuria[ ] ; Dysuria [ ] ; Nocturia[ ]   Vascular: Pain in legs with walking [ ] ; Pain in feet with lying flat [ ] ; Non-healing sores [ ] ; Stroke [ ] ; TIA [ ] ; Slurred speech [ ] ;  Neuro: Headaches[ ] ; Vertigo[ ] ; Seizures[ ] ; Paresthesias[ ] ;Blurred vision [ ] ; Diplopia [ ] ; Vision changes [ ]   Ortho/Skin: Arthritis ]; Joint pain ]; Muscle pain [ ] ; Joint swelling [ ] ;  Back Pain [ ] ; Rash [ ]   Psych: Depression[ ] ; Anxiety[ ]   Heme: Bleeding problems [ ] ; Clotting disorders [ ] ; Anemia [ ]   Endocrine: Diabetes [ ] ; Thyroid dysfunction[ ]    Past Medical History:  Diagnosis Date  . Chronic systolic CHF (congestive heart failure) (HCC)   . Hyperlipidemia   . Hypertension   . Mild mitral regurgitation 2016  . NICM (nonischemic cardiomyopathy) (HCC)   . Obesity   . Sleep apnea 12/24/12   Sleep study February 2014    Current Outpatient Medications  Medication Sig Dispense Refill  . amLODipine (NORVASC) 10 MG tablet Take 1 tablet (10 mg total) by mouth daily. 30 tablet 3  .  atorvastatin (LIPITOR) 10 MG tablet Take 1 tablet (10 mg total) by mouth daily. 30 tablet 3  . carvedilol (COREG) 25 MG tablet Take 1 tablet (25 mg total) by mouth 2 (two) times daily. 60 tablet 3  . colchicine 0.6 MG tablet Take 2 tablets at first sign of gout flare and then 1 tablet an hour later.  Repeat every 3 days until gout flare subsides. 12 tablet 0  . hydrALAZINE (APRESOLINE) 50 MG tablet Take 1 tablet (50 mg total) by mouth 2 (two) times daily. 60 tablet 3  . potassium chloride 20 MEQ TBCR Take 40 mEq by mouth daily. 90 tablet 3  . sacubitril-valsartan (ENTRESTO) 24-26 MG Take 1 tablet by mouth 2 (two) times daily. 60 tablet 1  . torsemide (DEMADEX) 20 MG tablet Take 80 mg in the morning and 40 mg in the evening 180 tablet 6   No current facility-administered medications for this encounter.    No Known Allergies    Social History   Socioeconomic History  . Marital status: Single    Spouse name: Not on file  . Number of children: 3  . Years of education: Not on file  . Highest education level: Not on file  Occupational History  . Occupation: Works with : [employer]  Tobacco Use  . Smoking status: Light Tobacco Smoker    Packs/day: 0.05    Years: 22.00    Pack years: 1.10    Types: Cigarettes  . Smokeless tobacco: Never Used  Substance and Sexual Activity  . Alcohol use: No    Alcohol/week: 30.0 standard drinks    Types: 30 Standard drinks or equivalent per week    Comment: x 1 week  . Drug use: No    Types: Marijuana  . Sexual activity: Not on file  Other Topics Concern  . Not on file  Social History Narrative  . Not on file   Social Determinants of Health   Financial Resource Strain:   . Difficulty of Paying Living Expenses: Not on file  Food Insecurity:   . Worried About in the Last Year: Not on file  . Ran Out of Food in the Last Year: Not on file  Transportation Needs:   . Lack of Transportation (Medical): Not  on file  . Lack of Transportation (Non-Medical): Not on file  Physical Activity:   . Days of Exercise per Week: Not on file  . Minutes of Exercise per Session: Not on file  Stress:   . Feeling of Stress : Not on file  Social Connections:   . Frequency of Communication with Friends and Family: Not on file  . Frequency of Social Gatherings with Friends and Family: Not on file  . Attends Religious Services:  Not on file  . Active Member of Clubs or Organizations: Not on file  . Attends Archivist Meetings: Not on file  . Marital Status: Not on file  Intimate Partner Violence:   . Fear of Current or Ex-Partner: Not on file  . Emotionally Abused: Not on file  . Physically Abused: Not on file  . Sexually Abused: Not on file      Family History  Problem Relation Age of Onset  . Arrhythmia Maternal Grandfather        Atrial fibrillation  . CAD Neg Hx     Vitals:   01/12/20 0932  BP: 139/76  Pulse: 91  SpO2: 95%  Weight: 114.9 kg (253 lb 3.2 oz)    PHYSICAL EXAM: General:  Obese male. No respiratory difficulty HEENT: normal Neck: supple. Hard to see JVP. Carotids 2+ bilat; no bruits. No lymphadenopathy or thryomegaly appreciated. Cor: PMI nondisplaced. Regular rate & rhythm. 2/6 SEMRSB with soft diastolic murmur Lungs: clear Abdomen: obese soft, nontender, nondistended. No hepatosplenomegaly. No bruits or masses. Good bowel sounds. Extremities: no cyanosis, clubbing, rash, edema Neuro: alert & oriented x 3, cranial nerves grossly intact. moves all 4 extremities w/o difficulty. Affect pleasant.  ECG:  11/24/20 Sinus tach 117. LAE. Non-specific ST depression. Personally reviewed    ASSESSMENT & PLAN:  1. Chronic systolic heart failure - due to NICM - suspect due to HTN and OSA - cath 03/2013 no CAD - Echo 11/20 EF 30-35% - Suspect EF up and down depending on compliance with BP and OSA control  - Stressed need for tighter control  - NYHA II now - Volume status  looks good perhaps a bit low with recent orthostasis - Decrease torsemide to 40 bid - Increase Entresto to 49/51g BID - Continue carvedilol 25 bid - Continue hyrdal 50 tid. Unclear why not on nitrates - Will need addition of spiro and SGLT2i in near future - Arrange f/u with HF PharmD for more aggressive titration of meds.  - Will need f/u echo in several months to reassess Aov and EF. - If EF < = 35% will need ICD - check labs  2. Moderate aortic regurgitation - f/u echo in 2-3 months. May need TEE  3. Hypertension - improving. Will increase Entresto - stressed need for better monitoring and complaince  4. OSA on CPAP - likely has severe OSA. Using old machine. In process of getting new device  5. Obesity. - needs weight loss  6. Tobacco use - stressed need to quit   Total time spent 45 minutes. Over half that time spent discussing above.   [name], MD  5:57 PM

## 2020-01-12 NOTE — Patient Instructions (Addendum)
DECREASE Torsemide to 40mg  twice daily  INCREASE Entresto to 49/51mg  twice daily  Routine lab work today. Will notify you of abnormal results  Follow up with PharmD every 2 weeks for 3 visits.  Follow up and echo with Dr.Bensimhon in 3 months

## 2020-01-17 ENCOUNTER — Telehealth (HOSPITAL_COMMUNITY): Payer: Self-pay | Admitting: *Deleted

## 2020-01-17 DIAGNOSIS — I5022 Chronic systolic (congestive) heart failure: Secondary | ICD-10-CM

## 2020-01-17 MED ORDER — SPIRONOLACTONE 25 MG PO TABS: 12.5000 mg | ORAL_TABLET | Freq: Every day | ORAL | 3 refills | Status: DC

## 2020-01-17 NOTE — Telephone Encounter (Signed)
-----   Message from Dolores Patty, MD sent at 01/13/2020  4:51 PM EST ----- Torsemide cut back yesterday.  Please have him take kcl 60 x 1 Add spiro 12.5 Repeat BMET next week

## 2020-01-25 ENCOUNTER — Ambulatory Visit (HOSPITAL_COMMUNITY)
Admission: RE | Admit: 2020-01-25 | Discharge: 2020-01-25 | Disposition: A | Discharge status: Home / Self Care | Payer: Managed Care, Other (non HMO) | Source: Ambulatory Visit | Attending: Cardiology | Admitting: Cardiology

## 2020-01-25 ENCOUNTER — Other Ambulatory Visit: Payer: Self-pay

## 2020-01-25 DIAGNOSIS — I5022 Chronic systolic (congestive) heart failure: Secondary | ICD-10-CM

## 2020-01-25 LAB — BASIC METABOLIC PANEL
Anion gap: 13 (ref 5–15)
BUN: 18 mg/dL (ref 6–20)
CO2: 28 mmol/L (ref 22–32)
Calcium: 9.2 mg/dL (ref 8.9–10.3)
Chloride: 100 mmol/L (ref 98–111)
Creatinine, Ser: 1.64 mg/dL — ABNORMAL HIGH (ref 0.61–1.24)
GFR calc Af Amer: 57 mL/min — ABNORMAL LOW (ref >60)
GFR calc non Af Amer: 49 mL/min — ABNORMAL LOW (ref >60)
Glucose, Bld: 137 mg/dL — ABNORMAL HIGH (ref 70–99)
Potassium: 3.5 mmol/L (ref 3.5–5.1)
Sodium: 141 mmol/L (ref 135–145)

## 2020-01-28 NOTE — Progress Notes (Signed)
Referring Physician: Primary Care: Primary Cardiologist: Colin Walton HF Cardiologist: Dr. Gala Romney  HPI:  Colin Waltonis a 49 y.o.malewith a hx of morbid obesity, hypertension, chronic systolic heart failure, due to nonischemic cardiomyopathy, moderate aortic insufficiency, OSA on CPAP, and alcohol use. Referred by Dr. Clifton Walton to establish care in the HF Clinic  He was first diagnosed with CHF in 2013 during a hospitalization. Echo revealed EF of 40-45%. Follow up sleep study revealed severe OSA prompting CPAP therapy (followed by Dr. Mayford Walton). He was also noted to have uncontrolled hypertension. Right and left heart cath 03/2013 with no obstructive CAD and an EF of 55%. Unfortunately, repeat echo 11/2015 revealed and EF of 35-40% and diffuse hypokinesis, trivial AI, and moderately dilated LA.   He was recently seen in follow up by Colin Walton [facility] via a telemedicine visit. He reported DOE and pitting edema, prompting a repeat echocardiogram. Echo on 11/22/19 revealed an EF of 30-35% with LVH, mildly dilated left atrium, moderate aortic valve regurgitation, and elevated pulmonary pressure. Echo was reviewed and TEE was recommended to better evaluate the aortic valve. Given his volume overload on exam with follow up BNP of 1700, his lasix were increased to 120 qAM and 80 qPM. An in-office visit was recommended and he saw Colin Walton last month.   He had been taking 120/80 mg lasix for 3 days and noticed a big improvement in his swelling. Unfortunately, he did not take any of his BP medications this morning prior to going to his visit. He did not want to take lasix prior to an appt, so he didn't take anything. He has a BP cuff at home, but has not used it. He was switched to Entresto 24/26 mg bid. He subsequently was switched to torsemide 80/40.  Recently presented to HF Clinic on 01/11/19 with Dr. Gala Romney.  Tries to space medicines. Yesterday took them all at once and got very dizzy.  Weight was stable at 280. No edema, orthopnea or PND. No CP. Uses CPAP every night but it is an old machine and has a leak. Has been working with [facility] to get a new system.  Has a BP cuff but doesn't remember the numbers well. Thinks mostly around 130s. Smokes 8 cigarettes/day. Drinks 2 glasses of tequilia 2 days per week. 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. Reported getting SOB if he walks too far or goes up steps  Today he returns to HF clinic for pharmacist medication titration. At last visit with MD, torsemide was decreased to 40 mg BID and Entresto was increased to 49/51 mg BID. Overall he is doing very well with those changes. No dizziness, lightheadedness, chest pain or palpitations. Reported SOB when walking long distances. His weight has been stable at home, down 1 lb from last clinic visit. He takes torsemide 40 mg BID and takes an extra 20 mg approximately 2x/week.  No LEE, PND or orthopnea. His appetite is good. He uses his CPAP every night. Takes all medications as prescribed. Did complain of high cost of Entresto, so I provided him with a copay card today. His BP was elevated in clinic at 175/104 - he had not taken his morning medications yet. Is still working on obtaining a home BP monitor.    HF Medications: Carvedilol 25 mg BID Entresto 49/51 mg BID Spironolactone 12.5 mg daily Hydralazine 50 mg BID Torsemide 40 mg BID Potassium chloride 40 mEq daily  Has the patient been experiencing any side effects to the medications prescribed?  No - no issues when separating medications from each other  Does the patient have any problems obtaining medications due to transportation or finances?  Has [employer]. Provided him with copay card for [facility] today (previously given to him but he never activated it).   Understanding of regimen: good Understanding of indications: good Potential of compliance: fair Patient understands to avoid NSAIDs. Patient understands to avoid  decongestants.    Pertinent Lab Values (01/25/20): Marland Kitchen Serum creatinine 1.64, BUN 18, Potassium 3.5, Sodium 141, BNP 860.2   Vital Signs: . Weight: 252.2 lbs (last clinic weight: 253.2 lbs) . Blood pressure: 175/104 (did not take AM meds)  . Heart rate: 105   Assessment/Plan: 1. Chronic systolic heart failure - due to NICM - suspect due to HTN and OSA - cath 03/2013 no CAD - Echo 11/20 EF 30-35% - Suspect EF up and down depending on compliance with BP and OSA control  - Stressed need for tighter control  - NYHA II, euvolemic on exam - Continue torsemide 40 mg BID. Continue potassium chloride 40 mEq daily.  - Continue carvedilol 25 mg BID - Continue Entresto 49/51g BID. Plan to increase next visit as tolerated.  - Increase spironolactone to 25 mg daily. Repeat BMET in 10-14 days.  - Continue hydralazine 50 mg TID. Start isosorbide mononitrate 30 mg daily.  - Will need f/u echo in several months to reassess Aov and EF. - If EF < = 35% will need ICD - check labs  2. Moderate aortic regurgitation - f/u echo in 2-3 months. May need TEE  3. Hypertension -BP markedly elevated 175/104, HR 105 - did not take morning medications this morning -Increase hydralazine and spironolactone as above. Continue carvedilol, Entresto, amlodipine, and spironolactone.  - stressed need for better monitoring and compliance  4. OSA on CPAP - likely has severe OSA. Using old machine. In process of getting new device  5. Obesity. - needs weight loss  6. Tobacco use - stressed need to quit   Plan: 1) Medication changes: Based on clinical presentation, vital signs and recent labs will Increase spironolactone to 25 mg daily and start isosorbide mononitrate 30 mg daily.  2) Follow-up: Pharmacy Clinic/BMET in 2 weeks.    Colin Walton, PharmD, BCPS, BCCP, CPP Heart Failure Clinic Pharmacist [phone]

## 2020-02-01 NOTE — Progress Notes (Deleted)
No chief complaint on file.  History of Present Illness: 49 yo male with history of HTN, OSA, cardiomyopathy and moderate alcohol use who is here today for cardiac follow up. He was admitted to [facility] December 2013 with acute CHF and was found to have reduced LV systolic function. He was diuresed and improved quickly. Echo 12/23/12 with LVEF 40-45%. He was discharged home on Lasix and Coreg. Sleep study February 2014 with severe OSA. He had been drinking 4-6 beers per day for years. He has had uncontrolled HTN for years. I performed a right and left heart cath on 04/02/13 with no evidence of CAD. Echo 12/21/15 with LVEF=35-40%.   He is here today for follow up. The patient denies any chest pain, dyspnea, palpitations, lower extremity edema, orthopnea, PND, dizziness, near syncope or syncope. He has been doing great. He has gained a few pounds but admits eating poorly.   Primary Care Physician: Renford Dills, MD  Past Medical History:  Diagnosis Date  . Chronic systolic CHF (congestive heart failure) (HCC)   . Hyperlipidemia   . Hypertension   . Mild mitral regurgitation 2016  . NICM (nonischemic cardiomyopathy) (HCC)   . Obesity   . Sleep apnea 12/24/12   Sleep study February 2014    Past Surgical History:  Procedure Laterality Date  . VASECTOMY      Current Outpatient Medications  Medication Sig Dispense Refill  . amLODipine (NORVASC) 10 MG tablet Take 1 tablet (10 mg total) by mouth daily. 30 tablet 3  . atorvastatin (LIPITOR) 10 MG tablet Take 1 tablet (10 mg total) by mouth daily. 30 tablet 3  . carvedilol (COREG) 25 MG tablet Take 1 tablet (25 mg total) by mouth 2 (two) times daily. 60 tablet 3  . colchicine 0.6 MG tablet Take 2 tablets at first sign of gout flare and then 1 tablet an hour later.  Repeat every 3 days until gout flare subsides. 12 tablet 0  . hydrALAZINE (APRESOLINE) 50 MG tablet Take 1 tablet (50 mg total) by mouth 2 (two) times daily. 60 tablet 3  . potassium  chloride 20 MEQ TBCR Take 40 mEq by mouth daily. 90 tablet 3  . sacubitril-valsartan (ENTRESTO) 24-26 MG Take 1 tablet by mouth 2 (two) times daily. 60 tablet 1  . sacubitril-valsartan (ENTRESTO) 49-51 MG Take 1 tablet by mouth 2 (two) times daily. 60 tablet 3  . spironolactone (ALDACTONE) 25 MG tablet Take 0.5 tablets (12.5 mg total) by mouth daily. 15 tablet 3  . torsemide (DEMADEX) 20 MG tablet Take 2 tablets (40 mg total) by mouth 2 (two) times daily. 180 tablet 6   No current facility-administered medications for this visit.    No Known Allergies  Social History   Socioeconomic History  . Marital status: Single    Spouse name: Not on file  . Number of children: 3  . Years of education: Not on file  . Highest education level: Not on file  Occupational History  . Occupation: Works with Copywriter, advertising: [employer]  Tobacco Use  . Smoking status: Light Tobacco Smoker    Packs/day: 0.05    Years: 22.00    Pack years: 1.10    Types: Cigarettes  . Smokeless tobacco: Never Used  Substance and Sexual Activity  . Alcohol use: No    Alcohol/week: 30.0 standard drinks    Types: 30 Standard drinks or equivalent per week    Comment: x 1 week  . Drug use:  No    Types: Marijuana  . Sexual activity: Not on file  Other Topics Concern  . Not on file  Social History Narrative  . Not on file   Social Determinants of Health   Financial Resource Strain:   . Difficulty of Paying Living Expenses: Not on file  Food Insecurity:   . Worried About Programme researcher, broadcasting/film/video in the Last Year: Not on file  . Ran Out of Food in the Last Year: Not on file  Transportation Needs:   . Lack of Transportation (Medical): Not on file  . Lack of Transportation (Non-Medical): Not on file  Physical Activity:   . Days of Exercise per Week: Not on file  . Minutes of Exercise per Session: Not on file  Stress:   . Feeling of Stress : Not on file  Social Connections:   . Frequency of Communication with  Friends and Family: Not on file  . Frequency of Social Gatherings with Friends and Family: Not on file  . Attends Religious Services: Not on file  . Active Member of Clubs or Organizations: Not on file  . Attends Banker Meetings: Not on file  . Marital Status: Not on file  Intimate Partner Violence:   . Fear of Current or Ex-Partner: Not on file  . Emotionally Abused: Not on file  . Physically Abused: Not on file  . Sexually Abused: Not on file    Family History  Problem Relation Age of Onset  . Arrhythmia Maternal Grandfather        Atrial fibrillation  . CAD Neg Hx     Review of Systems:  As stated in the HPI and otherwise negative.   There were no vitals taken for this visit.  Physical Examination:  General: Well developed, well nourished, NAD  HEENT: OP clear, mucus membranes moist  SKIN: warm, dry. No rashes. Neuro: No focal deficits  Musculoskeletal: Muscle strength 5/5 all ext  Psychiatric: Mood and affect normal  Neck: No JVD, no carotid bruits, no thyromegaly, no lymphadenopathy.  Lungs:Clear bilaterally, no wheezes, rhonci, crackles Cardiovascular: Regular rate and rhythm. No murmurs, gallops or rubs. Abdomen:Soft. Bowel sounds present. Non-tender.  Extremities: No lower extremity edema. Pulses are 2 + in the bilateral DP/PT.  Echo 12/21/15: Left ventricle: The cavity size was moderately dilated. Wall thickness was normal. Systolic function was moderately reduced. The estimated ejection fraction was in the range of 35% to 40%. Diffuse hypokinesis. - Aortic valve: There was trivial regurgitation. - Mitral valve: There was mild regurgitation. - Left atrium: The atrium was moderately dilated. - Atrial septum: No defect or patent foramen ovale was identified  EKG:  EKG is ordered today. The ekg ordered today demonstrates sinus tachycardia, rate 107 bpm.   Recent Labs: 11/22/2019: ALT 12; Hemoglobin 15.6; NT-Pro BNP 1,711; Platelets 267;  TSH 1.700 01/12/2020: B Natriuretic Peptide 860.2 01/25/2020: BUN 18; Creatinine, Ser 1.64; Potassium 3.5; Sodium 141   Lipid Panel    Component Value Date/Time   CHOL 125 11/22/2019 0907   TRIG 106 11/22/2019 0907   HDL 32 (L) 11/22/2019 0907   CHOLHDL 3.9 11/22/2019 0907   CHOLHDL 4.3 12/23/2012 0515   VLDL 39 12/23/2012 0515   LDLCALC 73 11/22/2019 0907     Wt Readings from Last 3 Encounters:  01/12/20 253 lb 3.2 oz (114.9 kg)  12/14/19 260 lb (117.9 kg)  11/25/19 252 lb 12.8 oz (114.7 kg)     Other studies Reviewed: Additional studies/ records that  were reviewed today include: . Review of the above records demonstrates:    Assessment and Plan:   1. Non-ischemic Cardiomyopathy: This is felt to be due to be due to HTN and etoh abuse. Will continue hydralazine, ARB and beta blocker. BP is well controlled.    2. Chronic systolic CHF:  Weight is up. He admits to dietary non-compliance. Volume status is ok. No LE edema. Continue Lasix.   3. Etoh abuse: He no longer drinks etoh.   4. Aortic valve insufficiency: Mild by echo 2016  5. HTN: BP is elevated. Will increase Coreg to 25 mg po BID. Continue ARB, hydralazine and Norvasc. Norvasc is not ideal given his cardiomyopathy but it has been necessary to control his BP. Continue current meds.    6. Mitral regurgitation: Mild by echo 2016  7. Tobacco abuse:  I have asked him to stop smoking.    Current medicines are reviewed at length with the patient today.  The patient does not have concerns regarding medicines.  The following changes have been made:  no change  Labs/ tests ordered today include:   No orders of the defined types were placed in this encounter.   Disposition:   FU with me in 12 months  Signed, Lauree Chandler, MD 02/01/2020 12:32 PM    [facility] [address], [place], [place]  [address] Phone: [phone]; Fax: [phone]

## 2020-02-03 ENCOUNTER — Ambulatory Visit: Payer: Managed Care, Other (non HMO) | Admitting: Cardiovascular Disease

## 2020-02-03 ENCOUNTER — Ambulatory Visit (HOSPITAL_COMMUNITY)
Admission: RE | Admit: 2020-02-03 | Discharge: 2020-02-03 | Disposition: A | Discharge status: Home / Self Care | Payer: Managed Care, Other (non HMO) | Source: Ambulatory Visit | Attending: Cardiology | Admitting: Cardiology

## 2020-02-03 ENCOUNTER — Other Ambulatory Visit: Payer: Self-pay

## 2020-02-03 VITALS — BP 175/104 | HR 105 | Wt 252.2 lb

## 2020-02-03 DIAGNOSIS — I351 Nonrheumatic aortic (valve) insufficiency: Secondary | ICD-10-CM | POA: Diagnosis not present

## 2020-02-03 DIAGNOSIS — F1021 Alcohol dependence, in remission: Secondary | ICD-10-CM | POA: Insufficient documentation

## 2020-02-03 DIAGNOSIS — Z79899 Other long term (current) drug therapy: Secondary | ICD-10-CM | POA: Insufficient documentation

## 2020-02-03 DIAGNOSIS — I110 Hypertensive heart disease with heart failure: Secondary | ICD-10-CM | POA: Insufficient documentation

## 2020-02-03 DIAGNOSIS — I428 Other cardiomyopathies: Secondary | ICD-10-CM | POA: Diagnosis not present

## 2020-02-03 DIAGNOSIS — Z720 Tobacco use: Secondary | ICD-10-CM | POA: Diagnosis not present

## 2020-02-03 DIAGNOSIS — I5022 Chronic systolic (congestive) heart failure: Secondary | ICD-10-CM

## 2020-02-03 DIAGNOSIS — G4733 Obstructive sleep apnea (adult) (pediatric): Secondary | ICD-10-CM | POA: Diagnosis not present

## 2020-02-03 MED ORDER — ISOSORBIDE MONONITRATE ER 30 MG PO TB24: 30.0000 mg | ORAL_TABLET | Freq: Every day | ORAL | 11 refills | Status: DC

## 2020-02-03 MED ORDER — SPIRONOLACTONE 25 MG PO TABS: 25.0000 mg | ORAL_TABLET | Freq: Every day | ORAL | 11 refills | Status: DC

## 2020-02-03 MED ORDER — COLCHICINE 0.6 MG PO TABS: ORAL_TABLET | ORAL | 1 refills | Status: DC

## 2020-02-03 NOTE — Patient Instructions (Addendum)
It was a pleasure seeing you today!  MEDICATIONS: -We are changing your medications today -Increase spironolactone to 25 mg (1 tablet) daily -Start taking isosorbide mononitrate 30 mg (1 tablet) daily -Call if you have questions about your medications.  NEXT APPOINTMENT: Return to clinic in 2 weeks with Pharmacy Clinic.  In general, to take care of your heart failure: -Limit your fluid intake to 2 Liters (half-gallon) per day.   -Limit your salt intake to ideally 2-3 grams (2000-3000 mg) per day. -Weigh yourself daily and record, and bring that "weight diary" to your next appointment.  (Weight gain of 2-3 pounds in 1 day typically means fluid weight.) -The medications for your heart are to help your heart and help you live longer.   -Please contact us before stopping any of your heart medications.  Call the clinic at [phone] with questions or to reschedule future appointments.

## 2020-02-10 NOTE — Progress Notes (Signed)
Referring Physician: Primary Care: Primary Cardiologist: Angelena Form HF Cardiologist: Dr. Haroldine Laws  HPI:  Colin Walton (Colin Walton)is a 49 y.o.malewith a hx of morbid obesity, hypertension, chronic systolic heart failure, due to nonischemic cardiomyopathy, moderate aortic insufficiency, OSA on CPAP, and alcohol use. Referred by Dr. Angelena Form to establish care in the HF Clinic  He was first diagnosed with CHF in 2013 during a hospitalization. Echo revealed EF of 40-45%. Follow up sleep study revealed severe OSA prompting CPAP therapy (followed by Dr. Radford Pax). He was also noted to have uncontrolled hypertension. Right and left heart cath 03/2013 with no obstructive CAD and an EF of 55%. Unfortunately, repeat echo 11/2015 revealed and EF of 35-40% and diffuse hypokinesis, trivial AI, and moderately dilated LA.   He was recently seen in follow up by Melina Copa [facility] via a telemedicine visit. He reported DOE and pitting edema, prompting a repeat echocardiogram. Echo on 11/22/19 revealed an EF of 30-35% with LVH, mildly dilated left atrium, moderate aortic valve regurgitation, and elevated pulmonary pressure. Echo was reviewed and TEE was recommended to better evaluate the aortic valve. Given his volume overload on exam with follow up BNP of 1700, his lasix were increased to 120 qAM and 80 qPM. An in-office visit was recommended and he saw [name] last month.   He had been taking 120/80 mg lasix for 3 days and noticed a big improvement in his swelling. Unfortunately, he did not take any of his BP medications this morning prior to going to his visit. He did not want to take lasix prior to an appt, so he didn't take anything. He has a BP cuff at home, but has not used it. He was switched to Entresto 24/26 mg bid. He subsequently was switched to torsemide 80/40.  Recently presented to HF Clinic on 01/11/19 with Dr. Haroldine Laws.  Tries to space medicines. Yesterday took them all at once and got very dizzy.  Weight was stable at 280. No edema, orthopnea or PND. No CP. Uses CPAP every night but it is an old machine and has a leak. Has been working with [facility] to get a new system.  Has a BP cuff but doesn't remember the numbers well. Thinks mostly around 130s. Smokes 8 cigarettes/day. Drinks 2 glasses of tequilia 2 days per week. 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 Reported getting SOB if he walks too far or goes up steps  Recently presented to HF Clinic for pharmacist medication titration. No dizziness, lightheadedness, chest pain or palpitations. Reported SOB when walking long distances. His weight had been stable at home, down 1 lb from last clinic visit. He reported taking torsemide 40 mg BID and takes an extra 20 mg approximately 2x/week.  No LEE, PND or orthopnea. His appetite was good. He uses his CPAP every night. Takes all medications as prescribed. Did complain of high cost of Entresto, so was provided with a copay card. His BP was elevated in clinic at 175/104 - he had not taken his morning medications yet. Is still working on obtaining a home BP monitor.   Today he returns to HF clinic for pharmacist medication titration.  At recent clinic visits, torsemide was decreased to 40 mg BID, Entresto was increased to 49/51 mg BID, spironolactone was increased to 25 mg daily and isosorbide mononitrate 30 mg daily was initiated.  Overall he is feeling well today, has no complaints. No dizziness, lightheadedness, chest pain or palpitations. Does get short of breath when going upstairs or with moderate  activity. His weight has been stable at home. He is taking torsemide 40 mg BID and uses an extra 20 mg approximately 2x/week. Last used extra on Tuesday. No PND/orthopnea. His appetite is good and he has been trying to follow a low salt diet, avoiding fried foods.   HF Medications: Carvedilol 25 mg BID Entresto 49/51 mg BID Spironolactone 25 mg daily Hydralazine 50 mg BID Isosorbide mononitrate 30 mg  daily Torsemide 40 mg BID Potassium chloride 40 mEq daily  Has the patient been experiencing any side effects to the medications prescribed?  No - no issues when separating medications from each other  Does the patient have any problems obtaining medications due to transportation or finances?  No - has Nurse, learning disability. Able to activate and use copay card for [facility].   Understanding of regimen: good Understanding of indications: good Potential of compliance: fair Patient understands to avoid NSAIDs. Patient understands to avoid decongestants.    Pertinent Lab Values (01/25/20): Marland Kitchen Serum creatinine 1.64, BUN 18, Potassium 3.5, Sodium 141, BNP 860.2   Vital Signs: . Weight: 245 lbs (last clinic weight: 252.2 lbs) . Blood pressure: 150/86 . Heart rate: 90  Assessment/Plan: 1. Chronic systolic heart failure - due to NICM - suspect due to HTN and OSA - cath 03/2013 no CAD - Echo 11/20 EF 30-35% - Suspect EF up and down depending on compliance with BP and OSA control  - Stressed need for tighter control  - NYHA II, euvolemic on exam - Continue torsemide 40 mg BID. Continue potassium chloride 40 mEq daily. BMET today.  - Continue carvedilol 25 mg BID - Increase Entresto to 97/103 mg BID. Check BMET today.  Repeat BMET in 2 weeks. - Continue spironolactone 25 mg daily. - Continue hydralazine 50 mg TID. Continue isosorbide mononitrate 30 mg daily.  - Will need f/u echo in several months to reassess Aov and EF. - If EF < = 35% will need ICD - check labs  2. Moderate aortic regurgitation - f/u echo in 2-3 months. May need TEE  3. Hypertension -BP elevated at 150/86 - endorsed taking all mediations this AM.  - Increase Entresto as above. Continue hydralazine, isosorbide mononitrate, spironolactone, carvedilol and amlodipine. - stressed need for better monitoring and compliance  4. OSA on CPAP - likely has severe OSA. Using old machine. In process of getting new  device  5. Obesity. - needs weight loss  6. Tobacco use - stressed need to quit   Plan: 1) Medication changes: Based on clinical presentation, vital signs and recent labs will increase Entresto to 97/103 mg BID. 2) Follow-up: 2 weeks in Pharmacy Clinic   Karle Plumber, PharmD, BCPS, [facility], CPP Heart Failure Clinic Pharmacist [phone]

## 2020-02-15 NOTE — Progress Notes (Signed)
Virtual Visit via Video Note   This visit type was conducted due to national recommendations for restrictions regarding the COVID-19 Pandemic (e.g. social distancing) in an effort to limit this patient's exposure and mitigate transmission in our community.  Due to his co-morbid illnesses, this patient is at least at moderate risk for complications without adequate follow up.  This format is felt to be most appropriate for this patient at this time.  All issues noted in this document were discussed and addressed.  A limited physical exam was performed with this format.  Please refer to the patient's chart for his consent to telehealth for [facility].   Date:  02/16/2020   ID:  [name], DOB [birth year], MRN [id number]  Patient Location: Home Provider Location: Office  PCP:  Seward Carol, MD  Cardiologist:  Lauree Chandler, MD  Electrophysiologist:  None   Evaluation Performed:  Follow-Up Visit  Chief Complaint:  CHF  History of Present Illness:    Archibald I Torr is a 49 y.o. male with a hx of hypertension, chronic systolic heart failure, nonischemic cardiomyopathy, moderate aortic insufficiency, OSA now on CPAP, and alcohol use. He was first diagnosed with CHF in 2013 during a hospitalization. Echo revealed EF of 40-45%. Follow up sleep study revealed severe OSA prompting CPAP therapy (followed by Dr. Radford Pax). He was also noted to have uncontrolled hypertension. Right and left heart cath 03/2013 with no obstructive CAD and an EF of 55%. Unfortunately, repeat echo 11/2015 revealed and EF of 35-40% and diffuse hypokinesis, trivial AI, and moderately dilated LA. He was recently seen in follow up by Melina Copa [facility] via a telemedicine visit. He reported DOE and pitting edema, prompting a repeat echocardiogram. Echo on  11/22/19 revealed an EF of 30-35% with LVH, mildly dilated left atrium, moderate to severe aortic valve regurgitation, and elevated pulmonary pressure. Echo was  reviewed and TEE was recommended to better evaluate the aortic valve. Given his volume overload on exam with follow up BNP of 1700, his lasix were increased to 120 qAM and 80 qPM. An in-office visit was recommended.   I saw him in follow up. On the morning of his visit, he did not want to take his lasix, so he didn't take anything. He was hypertensive. I started him on low dose entresto and continued his lasix 120/80 mg. Unfortunately, he had trouble affording entresto. Lasix switched to torsemide. Entresto samples and discount card given. He has since followed up in the AHF clinic with Dr. Haroldine Laws. Imdur added.  He presents for follow up. BP is elevated this morning, but he just got home from work and has not taken his morning medications. BP log (BP 1 hr after morning medications) is not readily available to him this morning. He weighs 248 lbs - weight has been stable. He denies SOB, but can become dyspneic if he is busy at work. No orthopnea. He is using compression stockings. No chest pain or other pre-syncopal events since spacing out medications. He is working to eat more at home and less fast food/less sodium.  The patient does not have symptoms concerning for COVID-19 infection (fever, chills, cough, or new shortness of breath).    Past Medical History:  Diagnosis Date  . Chronic systolic CHF (congestive heart failure) ([place])   . Hyperlipidemia   . Hypertension   . Mild mitral regurgitation 2016  . NICM (nonischemic cardiomyopathy) ([place])   . Obesity   . Sleep apnea 12/24/12   Sleep study  February 2014   Past Surgical History:  Procedure Laterality Date  . VASECTOMY       No outpatient medications have been marked as taking for the 02/16/20 encounter (Telemedicine) with Marcelino Duster, PA.     Allergies:   Patient has no known allergies.   Social History   Tobacco Use  . Smoking status: Light Tobacco Smoker    Packs/day: 0.05    Years: 22.00    Pack years: 1.10     Types: Cigarettes  . Smokeless tobacco: Never Used  Substance Use Topics  . Alcohol use: No    Alcohol/week: 30.0 standard drinks    Types: 30 Standard drinks or equivalent per week    Comment: x 1 week  . Drug use: No    Types: Marijuana     Family Hx: The patient's family history includes Arrhythmia in his maternal grandfather. There is no history of CAD.  ROS:   Please see the history of present illness.     All other systems reviewed and are negative.   Prior CV studies:   The following studies were reviewed today:  Echo 11/22/19 1. Left ventricular ejection fraction, by visual estimation, is 30 to  35%. The left ventricle has severely decreased function. There is mildly  increased left ventricular hypertrophy.  2. Multiple segmental abnormalities exist. See findings.  3. Mildly dilated left ventricular internal cavity size.  4. Global right ventricle has normal systolic function.The right  ventricular size is normal. No increase in right ventricular wall  thickness.  5. Left atrial size was mildly dilated.  6. Right atrial size was normal.  7. The mitral valve is normal in structure. Trace mitral valve  regurgitation. No evidence of mitral stenosis.  8. The tricuspid valve is normal in structure. Tricuspid valve  regurgitation is mild.  9. Aortic regurgitation PHT measures 253 msec.  10. Aortic valve regurgitation is moderate to severe.  11. The aortic valve is grossly normal. Aortic valve regurgitation is  moderate to severe. No evidence of aortic valve sclerosis or stenosis.  12. The pulmonic valve was normal in structure. Pulmonic valve  regurgitation is mild.  13. Moderately elevated pulmonary artery systolic pressure.  14. The inferior vena cava is dilated in size with <50% respiratory  variability, suggesting right atrial pressure of 15 mmHg.  15. Prior echo (trace aortic regurgitation). Now moderate to severe.  Consider TEE for clarification.    Labs/Other Tests and Data Reviewed:    EKG:  No ECG reviewed.  Recent Labs: 11/22/2019: ALT 12; Hemoglobin 15.6; NT-Pro BNP 1,711; Platelets 267; TSH 1.700 01/12/2020: B Natriuretic Peptide 860.2 01/25/2020: BUN 18; Creatinine, Ser 1.64; Potassium 3.5; Sodium 141   Recent Lipid Panel Lab Results  Component Value Date/Time   CHOL 125 11/22/2019 09:07 AM   TRIG 106 11/22/2019 09:07 AM   HDL 32 (L) 11/22/2019 09:07 AM   CHOLHDL 3.9 11/22/2019 09:07 AM   CHOLHDL 4.3 12/23/2012 05:15 AM   LDLCALC 73 11/22/2019 09:07 AM    Wt Readings from Last 3 Encounters:  02/16/20 240 lb (108.9 kg)  02/03/20 252 lb 3.2 oz (114.4 kg)  01/12/20 253 lb 3.2 oz (114.9 kg)     Objective:    Vital Signs:  BP (!) 173/93   Pulse (!) 119   Ht 5\' 7"  (1.702 m)   Wt 240 lb (108.9 kg)   BMI 37.59 kg/m    VITAL SIGNS:  reviewed GEN:  no acute distress EYES:  sclerae  anicteric, EOMI - Extraocular Movements Intact RESPIRATORY:  normal respiratory effort, symmetric expansion NEURO:  alert and oriented x 3, no obvious focal deficit PSYCH:  normal affect  ASSESSMENT & PLAN:    Chronic systolic heart failure - maintained on entrestro 49-51 mg BID, torsemide 40 mg BID, coreg 25 mg BID, hydralazine 50 mg BID, imdur 30 mg daily, spironolactone 25 mg daily, amlodipine 10 mg daily - patient did not have access to BP log at our visit and couldn't remember what his pressure has been running - will defer to his visit with AHF pharmacist tomorrow for medication changes, but will likely need uptitration of hydralazine +/- entresto - his weight has been stable at 248 lbs - some mild DOE at work - appreciate recommendations from AHF clinic   Moderate to severe aortic regurgitation - will evaluate with next TTE scheduled in [place] - may need TEE to better visualize the valve - may need a referral to TCTS   Hypertension - pressure elevated before medications, does not have BP log to review - will defer  increasing medications to pharmacist tomorrow - I asked him to bring his BP log and BP cuff to that visit   OSA on CPAP - this will help with his pressure control - using this nigtly   Obesity - he is working to eat more meals at home and less fast food   COVID-19 Education: The signs and symptoms of COVID-19 were discussed with the patient and how to seek care for testing (follow up with PCP or arrange E-visit).  The importance of social distancing was discussed today.  Time:   Today, I have spent 21 minutes with the patient with telehealth technology discussing the above problems.     Medication Adjustments/Labs and Tests Ordered: Current medicines are reviewed at length with the patient today.  Concerns regarding medicines are outlined above.   Tests Ordered: No orders of the defined types were placed in this encounter.   Medication Changes: No orders of the defined types were placed in this encounter.   Follow Up:  In Person in 6 months with Dr. Clifton James   Signed, Roe [address], [place]  02/16/2020 8:44 AM    [facility]

## 2020-02-16 ENCOUNTER — Telehealth (INDEPENDENT_AMBULATORY_CARE_PROVIDER_SITE_OTHER): Payer: Managed Care, Other (non HMO) | Admitting: Physician Assistant

## 2020-02-16 ENCOUNTER — Ambulatory Visit: Payer: Managed Care, Other (non HMO) | Admitting: Physician Assistant

## 2020-02-16 ENCOUNTER — Encounter: Payer: Self-pay | Admitting: Physician Assistant

## 2020-02-16 VITALS — BP 173/93 | HR 119 | Ht 67.0 in | Wt 240.0 lb

## 2020-02-16 DIAGNOSIS — I351 Nonrheumatic aortic (valve) insufficiency: Secondary | ICD-10-CM | POA: Diagnosis not present

## 2020-02-16 DIAGNOSIS — I10 Essential (primary) hypertension: Secondary | ICD-10-CM | POA: Diagnosis not present

## 2020-02-16 DIAGNOSIS — G4733 Obstructive sleep apnea (adult) (pediatric): Secondary | ICD-10-CM | POA: Diagnosis not present

## 2020-02-16 DIAGNOSIS — I5022 Chronic systolic (congestive) heart failure: Secondary | ICD-10-CM | POA: Diagnosis not present

## 2020-02-16 DIAGNOSIS — E669 Obesity, unspecified: Secondary | ICD-10-CM

## 2020-02-16 NOTE — Patient Instructions (Signed)
Medication Instructions:  Your physician recommends that you continue on your current medications as directed. Please refer to the Current Medication list given to you today.  *If you need a refill on your cardiac medications before your next appointment, please call your pharmacy*   Follow-Up: At [facility], you and your health needs are our priority.  As part of our continuing mission to provide you with exceptional heart care, we have created designated Provider Care Teams.  These Care Teams include your primary Cardiologist (physician) and Advanced Practice Providers (APPs -  Physician Assistants and Nurse Practitioners) who all work together to provide you with the care you need, when you need it.  Your next appointment:   6 month(s)  The format for your next appointment:   In Person  Provider:   Verne Carrow, MD (ONLY)

## 2020-02-17 ENCOUNTER — Ambulatory Visit (HOSPITAL_COMMUNITY)
Admission: RE | Admit: 2020-02-17 | Discharge: 2020-02-17 | Disposition: A | Discharge status: Home / Self Care | Payer: Managed Care, Other (non HMO) | Source: Ambulatory Visit | Attending: Internal Medicine | Admitting: Internal Medicine

## 2020-02-17 ENCOUNTER — Other Ambulatory Visit: Payer: Self-pay

## 2020-02-17 VITALS — BP 150/86 | HR 90 | Wt 245.0 lb

## 2020-02-17 DIAGNOSIS — Z79899 Other long term (current) drug therapy: Secondary | ICD-10-CM | POA: Insufficient documentation

## 2020-02-17 DIAGNOSIS — I428 Other cardiomyopathies: Secondary | ICD-10-CM | POA: Diagnosis not present

## 2020-02-17 DIAGNOSIS — R0602 Shortness of breath: Secondary | ICD-10-CM | POA: Diagnosis not present

## 2020-02-17 DIAGNOSIS — I110 Hypertensive heart disease with heart failure: Secondary | ICD-10-CM | POA: Insufficient documentation

## 2020-02-17 DIAGNOSIS — E669 Obesity, unspecified: Secondary | ICD-10-CM | POA: Diagnosis not present

## 2020-02-17 DIAGNOSIS — F17210 Nicotine dependence, cigarettes, uncomplicated: Secondary | ICD-10-CM | POA: Insufficient documentation

## 2020-02-17 DIAGNOSIS — I5022 Chronic systolic (congestive) heart failure: Secondary | ICD-10-CM

## 2020-02-17 DIAGNOSIS — G4733 Obstructive sleep apnea (adult) (pediatric): Secondary | ICD-10-CM | POA: Diagnosis not present

## 2020-02-17 DIAGNOSIS — Z7901 Long term (current) use of anticoagulants: Secondary | ICD-10-CM | POA: Insufficient documentation

## 2020-02-17 LAB — BASIC METABOLIC PANEL
Anion gap: 12 (ref 5–15)
BUN: 17 mg/dL (ref 6–20)
CO2: 29 mmol/L (ref 22–32)
Calcium: 9.1 mg/dL (ref 8.9–10.3)
Chloride: 100 mmol/L (ref 98–111)
Creatinine, Ser: 1.32 mg/dL — ABNORMAL HIGH (ref 0.61–1.24)
GFR calc Af Amer: >60 mL/min (ref >60)
GFR calc non Af Amer: >60 mL/min (ref >60)
Glucose, Bld: 121 mg/dL — ABNORMAL HIGH (ref 70–99)
Potassium: 3.2 mmol/L — ABNORMAL LOW (ref 3.5–5.1)
Sodium: 141 mmol/L (ref 135–145)

## 2020-02-17 LAB — BRAIN NATRIURETIC PEPTIDE: B Natriuretic Peptide: 859.4 pg/mL — ABNORMAL HIGH (ref 0.0–100.0)

## 2020-02-17 MED ORDER — SACUBITRIL-VALSARTAN 97-103 MG PO TABS: 1.0000 | ORAL_TABLET | Freq: Two times a day (BID) | ORAL | 11 refills | Status: AC

## 2020-02-17 NOTE — Patient Instructions (Signed)
It was a pleasure seeing you today!  MEDICATIONS: -We are changing your medications today -Increase Entresto to 97/103 (1 tablet) twice daily. You may take 2 tablets of the 49/51 mg strength twice daily until you pick up the new strength.  -Call if you have questions about your medications.  LABS: -We will call you if your labs need attention.  NEXT APPOINTMENT: Return to clinic in 2 weeks with Pharmacy Clinic.  In general, to take care of your heart failure: -Limit your fluid intake to 2 Liters (half-gallon) per day.   -Limit your salt intake to ideally 2-3 grams (2000-3000 mg) per day. -Weigh yourself daily and record, and bring that "weight diary" to your next appointment.  (Weight gain of 2-3 pounds in 1 day typically means fluid weight.) -The medications for your heart are to help your heart and help you live longer.   -Please contact us before stopping any of your heart medications.  Call the clinic at [phone] with questions or to reschedule future appointments.

## 2020-02-22 ENCOUNTER — Telehealth (HOSPITAL_COMMUNITY): Payer: Self-pay

## 2020-02-22 MED ORDER — POTASSIUM CHLORIDE ER 20 MEQ PO TBCR: 60.0000 meq | EXTENDED_RELEASE_TABLET | Freq: Every day | ORAL | 3 refills | Status: DC

## 2020-02-22 NOTE — Addendum Note (Signed)
Addended by: Modesta Messing on: 02/22/2020 02:42 PM   Modules accepted: Orders

## 2020-02-22 NOTE — Telephone Encounter (Signed)
-----   Message from Dolores Patty, MD sent at 02/20/2020 10:57 PM EST ----- Add k 20 meq daily (take 2 tabs the first day)

## 2020-02-22 NOTE — Telephone Encounter (Signed)
Pt aware and agreeable with plan.  

## 2020-02-22 NOTE — Telephone Encounter (Signed)
lmom 

## 2020-03-02 ENCOUNTER — Inpatient Hospital Stay (HOSPITAL_COMMUNITY): Admission: RE | Admit: 2020-03-02 | Payer: Managed Care, Other (non HMO) | Source: Ambulatory Visit

## 2020-03-09 NOTE — Progress Notes (Signed)
Referring Physician: Primary Care: Primary Cardiologist: Clifton James HF Cardiologist: Dr. Gala Romney  HPI:  Colin Walton ([name])is a 49 y.o.malewith a hx of morbid obesity, hypertension, chronic systolic heart failure, due to nonischemic cardiomyopathy, moderate aortic insufficiency, OSA on CPAP, and alcohol use. Referred by Dr. Clifton James to establish care in the HF Clinic  He was first diagnosed with CHF in 2013 during a hospitalization. Echo revealed EF of 40-45%. Follow up sleep study revealed severe OSA prompting CPAP therapy (followed by Dr. Mayford Knife). He was also noted to have uncontrolled hypertension. Right and left heart cath 03/2013 with no obstructive CAD and an EF of 55%. Unfortunately, repeat echo 11/2015 revealed and EF of 35-40% and diffuse hypokinesis, trivial AI, and moderately dilated LA.   He was recently seen in follow up by [name] [facility] via a telemedicine visit. He reported DOE and pitting edema, prompting a repeat echocardiogram. Echo on 11/22/19 revealed an EF of 30-35% with LVH, mildly dilated left atrium, moderate aortic valve regurgitation, and elevated pulmonary pressure. Echo was reviewed and TEE was recommended to better evaluate the aortic valve. Given his volume overload on exam with follow up BNP of 1700, his lasix were increased to 120 qAM and 80 qPM. An in-office visit was recommended and he saw [name] last month.   He had been taking 120/80 mg lasix for 3 days and noticed a big improvement in his swelling. Unfortunately, he did not take any of his BP medications this morning prior to going to his visit. He did not want to take lasix prior to an appt, so he didn't take anything. He has a BP cuff at home, but has not used it. He was switched to Entresto 24/26 mg bid. He subsequently was switched to torsemide 80/40.  Recently presented to HF Clinic on 01/11/19 with Dr. Gala Romney.  Tries to space medicines. Yesterday took them all at once and got very dizzy.  Weight was stable at 280. No edema, orthopnea or PND. No CP. Uses CPAP every night but it is an old machine and has a leak. Has been working with [facility] to get a new system.  Has a BP cuff but doesn't remember the numbers well. Thinks mostly around 130s. Smokes 8 cigarettes/day. Drinks 2 glasses of tequilia 2 days per week. 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  Overall he was feeling well, had no complaints. No dizziness, lightheadedness, chest pain or palpitations. Does get short of breath when going upstairs or with moderate activity. His weight had been stable at home. He was taking torsemide 40 mg BID and reported using an extra 20 mg approximately 2x/week. Last used extra on Tuesday. No PND/orthopnea. His appetite was good and he had been trying to follow a low salt diet, avoiding fried foods.   Today he returns to HF clinic for pharmacist medication titration. At last visit with pharmacy clinic, Entresto was increased to 97/103 mg BID. Potassium chloride was increased to 60 mEq daily for a K level of 3.2. Overall he is feeling well today. No dizziness, lightheadedness, chest pain or palpitations. Continues to complain of SOB with moderate activity and when going upstairs. His weight has been stable at 244-245 lbs. He continues to take torsemide 40 mg BID and uses an extra 20 mg ~2x/week. No LEE, PND or orthopnea. His appetite is good. Endorses taking all medications  as prescribed. BP is high today in clinic at 170/102, but he stated he had not taken his morning medications yet.    HF Medications: Carvedilol 25 mg BID Entresto 97/103 mg BID Spironolactone 25 mg daily Hydralazine 50 mg BID Isosorbide mononitrate 30 mg daily Torsemide 40 mg BID Potassium chloride 60 mEq daily  Has the patient been experiencing any side effects to the medications prescribed?  No - no  issues when separating medications from each other  Does the patient have any problems obtaining medications due to transportation or finances?  No - has Pharmacist, community. Able to activate and use copay card for [facility].   Understanding of regimen: good Understanding of indications: good Potential of compliance: fair Patient understands to avoid NSAIDs. Patient understands to avoid decongestants.    Pertinent Lab Values:: . Serum creatinine 1.27, BUN 17, Potassium 3.5, Sodium 138  Vital Signs: . Weight: 244.2 lbs (last clinic weight: 245 lbs) . Blood pressure: 170/102 (had not taken morning medications yet) . Heart rate: 100  Assessment/Plan: 1. Chronic systolic heart failure - due to NICM - suspect due to HTN and OSA - cath 03/2013 no CAD - Echo 11/20 EF 30-35% - Suspect EF up and down depending on compliance with BP and OSA control  - Stressed need for tighter control  -Vitals: BP 170/102, HR 100 - had not taken morning medications -Labs: Scr stable at 1.27, K 3.5 - NYHA II, euvolemic on exam - Continue torsemide 40 mg BID. Continue potassium chloride 60 mEq daily.   - Continue carvedilol 25 mg BID - Continue Entresto 97/103 mg BID.  - Continue spironolactone 25 mg daily. - Increase hydralazine to 75 mg BID. Increase isosorbide mononitrate to 60 mg daily.  - Will need f/u echo in several months to reassess Aov and EF. - If EF < = 35% will need ICD - check labs  2. Moderate aortic regurgitation - f/u echo in 2-3 months. May need TEE.  3. Hypertension -BP elevated at 170/102 - did not take medications this AM.  - Increase hydralazine and isosorbide mononitrate as above. Continue Entresto, spironolactone, carvedilol and amlodipine. - stressed need for better monitoring and compliance  4. OSA on CPAP - likely has severe OSA. Using old machine. In process of getting new device  5. Obesity. - needs weight loss  6. Tobacco use - stressed need to quit    Plan: 1) Medication changes: Based on clinical presentation, vital signs and recent labs will increase hydralazine to 7.5 mg BID and isosorbide mononitrate to 60 mg daily. 2) Follow-up: 3 weeks with Dr. Ballard Russell, PharmD, BCPS, [facility], CPP Heart Failure Clinic Pharmacist [phone]

## 2020-03-22 ENCOUNTER — Other Ambulatory Visit: Payer: Self-pay

## 2020-03-22 ENCOUNTER — Ambulatory Visit (HOSPITAL_COMMUNITY)
Admission: RE | Admit: 2020-03-22 | Discharge: 2020-03-22 | Disposition: A | Discharge status: Home / Self Care | Payer: Managed Care, Other (non HMO) | Source: Ambulatory Visit | Attending: Cardiology | Admitting: Cardiology

## 2020-03-22 VITALS — BP 170/102 | HR 100 | Wt 244.2 lb

## 2020-03-22 DIAGNOSIS — I5022 Chronic systolic (congestive) heart failure: Secondary | ICD-10-CM | POA: Diagnosis present

## 2020-03-22 DIAGNOSIS — F17210 Nicotine dependence, cigarettes, uncomplicated: Secondary | ICD-10-CM | POA: Insufficient documentation

## 2020-03-22 DIAGNOSIS — Z79899 Other long term (current) drug therapy: Secondary | ICD-10-CM | POA: Diagnosis not present

## 2020-03-22 DIAGNOSIS — I351 Nonrheumatic aortic (valve) insufficiency: Secondary | ICD-10-CM | POA: Diagnosis not present

## 2020-03-22 DIAGNOSIS — G4733 Obstructive sleep apnea (adult) (pediatric): Secondary | ICD-10-CM | POA: Diagnosis not present

## 2020-03-22 DIAGNOSIS — I110 Hypertensive heart disease with heart failure: Secondary | ICD-10-CM | POA: Diagnosis not present

## 2020-03-22 DIAGNOSIS — I428 Other cardiomyopathies: Secondary | ICD-10-CM | POA: Diagnosis not present

## 2020-03-22 LAB — BASIC METABOLIC PANEL
Anion gap: 10 (ref 5–15)
BUN: 17 mg/dL (ref 6–20)
CO2: 28 mmol/L (ref 22–32)
Calcium: 8.8 mg/dL — ABNORMAL LOW (ref 8.9–10.3)
Chloride: 100 mmol/L (ref 98–111)
Creatinine, Ser: 1.27 mg/dL — ABNORMAL HIGH (ref 0.61–1.24)
GFR calc Af Amer: >60 mL/min (ref >60)
GFR calc non Af Amer: >60 mL/min (ref >60)
Glucose, Bld: 141 mg/dL — ABNORMAL HIGH (ref 70–99)
Potassium: 3.5 mmol/L (ref 3.5–5.1)
Sodium: 138 mmol/L (ref 135–145)

## 2020-03-22 MED ORDER — HYDRALAZINE HCL 50 MG PO TABS: 75.0000 mg | ORAL_TABLET | Freq: Two times a day (BID) | ORAL | 11 refills | Status: DC

## 2020-03-22 MED ORDER — ISOSORBIDE MONONITRATE ER 60 MG PO TB24: 60.0000 mg | ORAL_TABLET | Freq: Every day | ORAL | 11 refills | Status: AC

## 2020-03-22 NOTE — Patient Instructions (Addendum)
It was a pleasure seeing you today!  MEDICATIONS: -We are changing your medications today -Increase isosorbide mononitrate (Imdur) to 60 mg (1 tablet) daily -Increase hydralazine to 75 mg (1.5 tablets) two times daily -Call if you have questions about your medications.  LABS: -We will call you if your labs need attention.  NEXT APPOINTMENT: Return to clinic in 3 weeks with Dr. Gala Romney.  In general, to take care of your heart failure: -Limit your fluid intake to 2 Liters (half-gallon) per day.   -Limit your salt intake to ideally 2-3 grams (2000-3000 mg) per day. -Weigh yourself daily and record, and bring that "weight diary" to your next appointment.  (Weight gain of 2-3 pounds in 1 day typically means fluid weight.) -The medications for your heart are to help your heart and help you live longer.   -Please contact us before stopping any of your heart medications.  Call the clinic at [phone] with questions or to reschedule future appointments.

## 2020-04-13 ENCOUNTER — Encounter (HOSPITAL_COMMUNITY): Payer: Self-pay | Admitting: Internal Medicine

## 2020-04-13 ENCOUNTER — Other Ambulatory Visit: Payer: Self-pay

## 2020-04-13 ENCOUNTER — Other Ambulatory Visit (HOSPITAL_COMMUNITY): Payer: Self-pay | Admitting: *Deleted

## 2020-04-13 ENCOUNTER — Ambulatory Visit (HOSPITAL_BASED_OUTPATIENT_CLINIC_OR_DEPARTMENT_OTHER)
Admission: RE | Admit: 2020-04-13 | Discharge: 2020-04-13 | Disposition: A | Discharge status: Home / Self Care | Payer: Managed Care, Other (non HMO) | Source: Ambulatory Visit | Attending: Internal Medicine | Admitting: Internal Medicine

## 2020-04-13 ENCOUNTER — Ambulatory Visit (HOSPITAL_COMMUNITY)
Admission: RE | Admit: 2020-04-13 | Discharge: 2020-04-13 | Disposition: A | Discharge status: Home / Self Care | Payer: Managed Care, Other (non HMO) | Source: Ambulatory Visit | Attending: Internal Medicine | Admitting: Internal Medicine

## 2020-04-13 VITALS — BP 160/96 | HR 109 | Wt 244.4 lb

## 2020-04-13 DIAGNOSIS — I5022 Chronic systolic (congestive) heart failure: Secondary | ICD-10-CM

## 2020-04-13 DIAGNOSIS — F17210 Nicotine dependence, cigarettes, uncomplicated: Secondary | ICD-10-CM | POA: Diagnosis not present

## 2020-04-13 DIAGNOSIS — Z6838 Body mass index (BMI) 38.0-38.9, adult: Secondary | ICD-10-CM | POA: Insufficient documentation

## 2020-04-13 DIAGNOSIS — I351 Nonrheumatic aortic (valve) insufficiency: Secondary | ICD-10-CM

## 2020-04-13 DIAGNOSIS — E785 Hyperlipidemia, unspecified: Secondary | ICD-10-CM | POA: Diagnosis not present

## 2020-04-13 DIAGNOSIS — I509 Heart failure, unspecified: Secondary | ICD-10-CM

## 2020-04-13 DIAGNOSIS — I428 Other cardiomyopathies: Secondary | ICD-10-CM | POA: Insufficient documentation

## 2020-04-13 DIAGNOSIS — Z8249 Family history of ischemic heart disease and other diseases of the circulatory system: Secondary | ICD-10-CM | POA: Diagnosis not present

## 2020-04-13 DIAGNOSIS — Z79899 Other long term (current) drug therapy: Secondary | ICD-10-CM | POA: Diagnosis not present

## 2020-04-13 DIAGNOSIS — G4733 Obstructive sleep apnea (adult) (pediatric): Secondary | ICD-10-CM

## 2020-04-13 DIAGNOSIS — E669 Obesity, unspecified: Secondary | ICD-10-CM

## 2020-04-13 DIAGNOSIS — I110 Hypertensive heart disease with heart failure: Secondary | ICD-10-CM | POA: Insufficient documentation

## 2020-04-13 DIAGNOSIS — I10 Essential (primary) hypertension: Secondary | ICD-10-CM | POA: Diagnosis not present

## 2020-04-13 LAB — CBC
HCT: 50.5 % (ref 39.0–52.0)
Hemoglobin: 15.8 g/dL (ref 13.0–17.0)
MCH: 27 pg (ref 26.0–34.0)
MCHC: 31.3 g/dL (ref 30.0–36.0)
MCV: 86.3 fL (ref 80.0–100.0)
Platelets: 196 10*3/uL (ref 150–400)
RBC: 5.85 MIL/uL — ABNORMAL HIGH (ref 4.22–5.81)
RDW: 19.5 % — ABNORMAL HIGH (ref 11.5–15.5)
WBC: 5 10*3/uL (ref 4.0–10.5)
nRBC: 0 % (ref 0.0–0.2)

## 2020-04-13 LAB — BASIC METABOLIC PANEL
Anion gap: 12 (ref 5–15)
BUN: 18 mg/dL (ref 6–20)
CO2: 28 mmol/L (ref 22–32)
Calcium: 9 mg/dL (ref 8.9–10.3)
Chloride: 98 mmol/L (ref 98–111)
Creatinine, Ser: 1.57 mg/dL — ABNORMAL HIGH (ref 0.61–1.24)
GFR calc Af Amer: 60 mL/min — ABNORMAL LOW (ref >60)
GFR calc non Af Amer: 52 mL/min — ABNORMAL LOW (ref >60)
Glucose, Bld: 118 mg/dL — ABNORMAL HIGH (ref 70–99)
Potassium: 3.3 mmol/L — ABNORMAL LOW (ref 3.5–5.1)
Sodium: 138 mmol/L (ref 135–145)

## 2020-04-13 LAB — BRAIN NATRIURETIC PEPTIDE: B Natriuretic Peptide: 1269.4 pg/mL — ABNORMAL HIGH (ref 0.0–100.0)

## 2020-04-13 MED ORDER — HYDRALAZINE HCL 100 MG PO TABS: 100.0000 mg | ORAL_TABLET | Freq: Three times a day (TID) | ORAL | 6 refills | Status: DC

## 2020-04-13 NOTE — Progress Notes (Signed)
 ADVANCED HF CLINIC CONSULT NOTE  Referring Physician: Primary Care: Primary Cardiologist: Mcalhany  HPI:  Colin Walton ([name]) is a 48 y.o. male with a hx of morbid obesity, hypertension, chronic systolic heart failure, due to nonischemic cardiomyopathy, moderate aortic insufficiency, OSA on CPAP, and alcohol use. Referred by Dr. McAlhany to establish care in the HF Clinic  He was first diagnosed with CHF in 2013 during a hospitalization. Echo revealed EF of 40-45%. Follow up sleep study revealed severe OSA prompting CPAP therapy (followed by Dr. Turner). He was also noted to have uncontrolled hypertension. Right and left heart cath 03/2013 with no obstructive CAD and an EF of 55%. Unfortunately, repeat echo 11/2015 revealed and EF of 35-40% and diffuse hypokinesis, trivial AI, and moderately dilated LA.   Echo on  11/22/19 revealed an EF of 30-35% with LVH, mildly dilated left atrium, moderate aortic valve regurgitation, and elevated pulmonary pressure. Echo was reviewed and TEE was recommended to better evaluate the aortic valve.   Here for f/u. 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 Feels pretty good 7-8/10. Can get around and do activities without too much problem. Has 12-16 steps at home and some days is huffing and puffing on steps. Mild LE edema after work. No CP, orthopnea or PND. Compliant with meds but has to space them out or he gets dizzy. Wears CPAP every day. SBP at home 160s.   Drinking a lot lately due to his brother dying a month ago from alcolohlism. Drinking 1/2 a fifth of whiskey several days/week.  Still smoking 1/2 ppd.    Echo today EF 20-25% Moderate RV dysfunction (worse from previous)  - reviewed personally   Past Medical History:  Diagnosis Date  . Chronic systolic CHF (congestive heart failure) (HCC)   . Hyperlipidemia   . Hypertension   . Mild mitral regurgitation 2016  . NICM (nonischemic cardiomyopathy) (HCC)   . Obesity   . Sleep  apnea 12/24/12   Sleep study February 2014    Current Outpatient Medications  Medication Sig Dispense Refill  . amLODipine (NORVASC) 10 MG tablet Take 1 tablet (10 mg total) by mouth daily. 30 tablet 3  . atorvastatin (LIPITOR) 10 MG tablet Take 1 tablet (10 mg total) by mouth daily. 30 tablet 3  . carvedilol (COREG) 25 MG tablet Take 1 tablet (25 mg total) by mouth 2 (two) times daily. 60 tablet 3  . colchicine 0.6 MG tablet Take 2 tablets at first sign of gout flare and then 1 tablet an hour later.  Repeat every 3 days until gout flare subsides. 12 tablet 1  . hydrALAZINE (APRESOLINE) 50 MG tablet Take 1.5 tablets (75 mg total) by mouth 2 (two) times daily. 90 tablet 11  . isosorbide mononitrate (IMDUR) 60 MG 24 hr tablet Take 1 tablet (60 mg total) by mouth daily. 30 tablet 11  . Potassium Chloride ER 20 MEQ TBCR Take 40 mEq by mouth daily.    . sacubitril-valsartan (ENTRESTO) 97-103 MG Take 1 tablet by mouth 2 (two) times daily. 60 tablet 11  . spironolactone (ALDACTONE) 25 MG tablet Take 1 tablet (25 mg total) by mouth daily. 30 tablet 11  . torsemide (DEMADEX) 20 MG tablet Take 20 mg by mouth daily.     No current facility-administered medications for this encounter.    No Known Allergies    Social History   Socioeconomic History  . Marital status: Single    Spouse name: Not on file  .   Number of children: 3  . Years of education: Not on file  . Highest education level: Not on file  Occupational History  . Occupation: Works with Copywriter, advertising: [employer]  Tobacco Use  . Smoking status: Light Tobacco Smoker    Packs/day: 0.05    Years: 22.00    Pack years: 1.10    Types: Cigarettes  . Smokeless tobacco: Never Used  Substance and Sexual Activity  . Alcohol use: No    Alcohol/week: 30.0 standard drinks    Types: 30 Standard drinks or equivalent per week    Comment: x 1 week  . Drug use: No    Types: Marijuana  . Sexual activity: Not on file  Other Topics  Concern  . Not on file  Social History Narrative  . Not on file   Social Determinants of Health   Financial Resource Strain:   . Difficulty of Paying Living Expenses:   Food Insecurity:   . Worried About Programme researcher, broadcasting/film/video in the Last Year:   . Barista in the Last Year:   Transportation Needs:   . Freight forwarder (Medical):   Marland Kitchen Lack of Transportation (Non-Medical):   Physical Activity:   . Days of Exercise per Week:   . Minutes of Exercise per Session:   Stress:   . Feeling of Stress :   Social Connections:   . Frequency of Communication with Friends and Family:   . Frequency of Social Gatherings with Friends and Family:   . Attends Religious Services:   . Active Member of Clubs or Organizations:   . Attends Banker Meetings:   Marland Kitchen Marital Status:   Intimate Partner Violence:   . Fear of Current or Ex-Partner:   . Emotionally Abused:   Marland Kitchen Physically Abused:   . Sexually Abused:       Family History  Problem Relation Age of Onset  . Arrhythmia Maternal Grandfather        Atrial fibrillation  . CAD Neg Hx     Vitals:   04/13/20 1049  BP: (!) 160/96  Pulse: (!) 109  SpO2: 90%  Weight: 110.9 kg (244 lb 6.4 oz)    PHYSICAL EXAM: General:  Obese male. No resp difficulty HEENT: normal Neck: supple. no JVD. Carotids 2+ bilat; no bruits. No lymphadenopathy or thryomegaly appreciated. Cor: PMI nondisplaced. Tachy regular 3/6 AI Lungs: clear Abdomen: obese soft, nontender, nondistended. No hepatosplenomegaly. No bruits or masses. Good bowel sounds. Extremities: no cyanosis, clubbing, rash, edema Neuro: alert & orientedx3, cranial nerves grossly intact. moves all 4 extremities w/o difficulty. Affect pleasant   ECG:   Sinus tach 105. BAE. Non-specific ST depression. Personally reviewed   ASSESSMENT & PLAN:  1. Chronic systolic heart failure - due to NICM - suspect due to HTN and OSA +/- ETOH - cath 03/2013 no CAD - Echo 11/20 EF  30-35% - Echo today EF 20-25% moderate RV dysfunction (worse since previous) - Personally reviewed - NYHA II-III - Volume status looks ok - Continue torsemide 40 bid - Continue Entresto 97/103 bid - Continue carvedilol 25 bid - Increase hydral to 100 tid. Continue imdur - Will need addition of SGLT2i in near future - Long talk about need to get BP down and also completely stop ETOH - We discussed ICD but he wants to defer to try to get EF better with ETOH cessation - AI may be severe based on P1/2 time. Will need TEE  2. Moderate aortic regurgitation - Visually mild to moderate on echo but P1/2 time 260ms suggesting very severe AI. With worsening EF needs TEE to evalaute  3. Hypertension - BP high - Continue CPAP. Increase hydral.   4. OSA on CPAP - Continue CPAP  5. Obesity. - needs weight loss  6. Tobacco use - he understands need to quit  Total time spent 40 minutes. Over half that time spent discussing above.    [name], MD  11:03 AM

## 2020-04-13 NOTE — Progress Notes (Signed)
  Echocardiogram 2D Echocardiogram has been performed.  Tye Savoy 04/13/2020, 10:56 AM

## 2020-04-13 NOTE — H&P (View-Only) (Signed)
ADVANCED HF CLINIC CONSULT NOTE  Referring Physician: Primary Care: Primary Cardiologist: Clifton James  HPI:  Colin Walton) is a 49 y.o. male with a hx of morbid obesity, hypertension, chronic systolic heart failure, due to nonischemic cardiomyopathy, moderate aortic insufficiency, OSA on CPAP, and alcohol use. Referred by Dr. Clifton James to establish care in the HF Clinic  He was first diagnosed with CHF in 2013 during a hospitalization. Echo revealed EF of 40-45%. Follow up sleep study revealed severe OSA prompting CPAP therapy (followed by Dr. Mayford Knife). He was also noted to have uncontrolled hypertension. Right and left heart cath 03/2013 with no obstructive CAD and an EF of 55%. Unfortunately, repeat echo 11/2015 revealed and EF of 35-40% and diffuse hypokinesis, trivial AI, and moderately dilated LA.   Echo on  11/22/19 revealed an EF of 30-35% with LVH, mildly dilated left atrium, moderate aortic valve regurgitation, and elevated pulmonary pressure. Echo was reviewed and TEE was recommended to better evaluate the aortic valve.   Here for f/u. Still working FT at [employer]. Feels pretty good 7-8/10. Can get around and do activities without too much problem. Has 12-16 steps at home and some days is huffing and puffing on steps. Mild LE edema after work. No CP, orthopnea or PND. Compliant with meds but has to space them out or he gets dizzy. Wears CPAP every day. SBP at home 160s.   Drinking a lot lately due to his brother dying a month ago from alcolohlism. Drinking 1/2 a fifth of whiskey several days/week.  Still smoking 1/2 ppd.    Echo today EF 20-25% Moderate RV dysfunction (worse from previous)  - reviewed personally   Past Medical History:  Diagnosis Date  . Chronic systolic CHF (congestive heart failure) (HCC)   . Hyperlipidemia   . Hypertension   . Mild mitral regurgitation 2016  . NICM (nonischemic cardiomyopathy) (HCC)   . Obesity   . Sleep  apnea 12/24/12   Sleep study February 2014    Current Outpatient Medications  Medication Sig Dispense Refill  . amLODipine (NORVASC) 10 MG tablet Take 1 tablet (10 mg total) by mouth daily. 30 tablet 3  . atorvastatin (LIPITOR) 10 MG tablet Take 1 tablet (10 mg total) by mouth daily. 30 tablet 3  . carvedilol (COREG) 25 MG tablet Take 1 tablet (25 mg total) by mouth 2 (two) times daily. 60 tablet 3  . colchicine 0.6 MG tablet Take 2 tablets at first sign of gout flare and then 1 tablet an hour later.  Repeat every 3 days until gout flare subsides. 12 tablet 1  . hydrALAZINE (APRESOLINE) 50 MG tablet Take 1.5 tablets (75 mg total) by mouth 2 (two) times daily. 90 tablet 11  . isosorbide mononitrate (IMDUR) 60 MG 24 hr tablet Take 1 tablet (60 mg total) by mouth daily. 30 tablet 11  . Potassium Chloride ER 20 MEQ TBCR Take 40 mEq by mouth daily.    . sacubitril-valsartan (ENTRESTO) 97-103 MG Take 1 tablet by mouth 2 (two) times daily. 60 tablet 11  . spironolactone (ALDACTONE) 25 MG tablet Take 1 tablet (25 mg total) by mouth daily. 30 tablet 11  . torsemide (DEMADEX) 20 MG tablet Take 20 mg by mouth daily.     No current facility-administered medications for this encounter.    No Known Allergies    Social History   Socioeconomic History  . Marital status: Single    Spouse name: Not on file  .  Number of children: 3  . Years of education: Not on file  . Highest education level: Not on file  Occupational History  . Occupation: Works with Copywriter, advertising: [employer]  Tobacco Use  . Smoking status: Light Tobacco Smoker    Packs/day: 0.05    Years: 22.00    Pack years: 1.10    Types: Cigarettes  . Smokeless tobacco: Never Used  Substance and Sexual Activity  . Alcohol use: No    Alcohol/week: 30.0 standard drinks    Types: 30 Standard drinks or equivalent per week    Comment: x 1 week  . Drug use: No    Types: Marijuana  . Sexual activity: Not on file  Other Topics  Concern  . Not on file  Social History Narrative  . Not on file   Social Determinants of Health   Financial Resource Strain:   . Difficulty of Paying Living Expenses:   Food Insecurity:   . Worried About Programme researcher, broadcasting/film/video in the Last Year:   . Barista in the Last Year:   Transportation Needs:   . Freight forwarder (Medical):   Marland Kitchen Lack of Transportation (Non-Medical):   Physical Activity:   . Days of Exercise per Week:   . Minutes of Exercise per Session:   Stress:   . Feeling of Stress :   Social Connections:   . Frequency of Communication with Friends and Family:   . Frequency of Social Gatherings with Friends and Family:   . Attends Religious Services:   . Active Member of Clubs or Organizations:   . Attends Banker Meetings:   Marland Kitchen Marital Status:   Intimate Partner Violence:   . Fear of Current or Ex-Partner:   . Emotionally Abused:   Marland Kitchen Physically Abused:   . Sexually Abused:       Family History  Problem Relation Age of Onset  . Arrhythmia Maternal Grandfather        Atrial fibrillation  . CAD Neg Hx     Vitals:   04/13/20 1049  BP: (!) 160/96  Pulse: (!) 109  SpO2: 90%  Weight: 110.9 kg (244 lb 6.4 oz)    PHYSICAL EXAM: General:  Obese male. No resp difficulty HEENT: normal Neck: supple. no JVD. Carotids 2+ bilat; no bruits. No lymphadenopathy or thryomegaly appreciated. Cor: PMI nondisplaced. Tachy regular 3/6 AI Lungs: clear Abdomen: obese soft, nontender, nondistended. No hepatosplenomegaly. No bruits or masses. Good bowel sounds. Extremities: no cyanosis, clubbing, rash, edema Neuro: alert & orientedx3, cranial nerves grossly intact. moves all 4 extremities w/o difficulty. Affect pleasant   ECG:   Sinus tach 105. BAE. Non-specific ST depression. Personally reviewed   ASSESSMENT & PLAN:  1. Chronic systolic heart failure - due to NICM - suspect due to HTN and OSA +/- ETOH - cath 03/2013 no CAD - Echo 11/20 EF  30-35% - Echo today EF 20-25% moderate RV dysfunction (worse since previous) - Personally reviewed - NYHA II-III - Volume status looks ok - Continue torsemide 40 bid - Continue Entresto 97/103 bid - Continue carvedilol 25 bid - Increase hydral to 100 tid. Continue imdur - Will need addition of SGLT2i in near future - Long talk about need to get BP down and also completely stop ETOH - We discussed ICD but he wants to defer to try to get EF better with ETOH cessation - AI may be severe based on P1/2 time. Will need TEE  2. Moderate aortic regurgitation - Visually mild to moderate on echo but P1/2 time 260ms suggesting very severe AI. With worsening EF needs TEE to evalaute  3. Hypertension - BP high - Continue CPAP. Increase hydral.   4. OSA on CPAP - Continue CPAP  5. Obesity. - needs weight loss  6. Tobacco use - he understands need to quit  Total time spent 40 minutes. Over half that time spent discussing above.    [name], MD  11:03 AM

## 2020-04-13 NOTE — Patient Instructions (Signed)
Increase Hydralazine to 100 mg Three times a day   Labs done today, your results will be available in MyChart, we will contact you for abnormal readings.  Your physician has requested that you have a TEE. During a TEE, sound waves are used to create images of your heart. It provides your doctor with information about the size and shape of your heart and how well your heart's chambers and valves are working. In this test, a transducer is attached to the end of a flexible tube that's guided down your throat and into your esophagus (the tube leading from you mouth to your stomach) to get a more detailed image of your heart. You are not awake for the procedure. Please see the instruction sheet given to you today. For further information please visit [email or web address]  Your physician recommends that you schedule a follow-up appointment in: 2 months  If you have any questions or concerns before your next appointment please send [place] a message through [place] or call our office at [phone].  At the [facility], you and your health needs are our priority. As part of our continuing mission to provide you with exceptional heart care, we have created designated Provider Care Teams. These Care Teams include your primary Cardiologist (physician) and Advanced Practice Providers (APPs- Physician Assistants and Nurse Practitioners) who all work together to provide you with the care you need, when you need it.   You may see any of the following providers on your designated Care Team at your next follow up: Marland Kitchen Dr Arvilla Meres . Dr Marca Ancona . Tonye Becket, NP . Robbie Lis, PA . Karle Plumber, PharmD   Please be sure to bring in all your medications bottles to every appointment.   INSTRUCTIONS FOR TEE:  You are scheduled for a TEE on TUESDAY with Dr. Gala Romney.  Please arrive at the [facility] (Main Entrance A) at [facility]: [address] [place], [place] [address] at  12:00 pm.  DIET: Nothing to eat or drink after midnight except a sip of water with medications (see medication instructions below)  Medication Instructions: Hold Torsemide AM of test  You will need to continue your anticoagulant after your procedure until you  are told by your  Provider that it is safe to stop   COVID TEST: Monday 4/26 AT   You must have a responsible person to drive you home and stay in the waiting area during your procedure. Failure to do so could result in cancellation.  Bring your insurance cards.  *Special Note: Every effort is made to have your procedure done on time. Occasionally there are emergencies that occur at the hospital that may cause delays. Please be patient if a delay does occur.

## 2020-04-17 ENCOUNTER — Other Ambulatory Visit (HOSPITAL_COMMUNITY)
Admission: RE | Admit: 2020-04-17 | Discharge: 2020-04-17 | Disposition: A | Discharge status: Home / Self Care | Payer: Managed Care, Other (non HMO) | Source: Ambulatory Visit | Attending: Internal Medicine | Admitting: Internal Medicine

## 2020-04-17 DIAGNOSIS — Z20822 Contact with and (suspected) exposure to covid-19: Secondary | ICD-10-CM | POA: Diagnosis not present

## 2020-04-17 DIAGNOSIS — Z01812 Encounter for preprocedural laboratory examination: Secondary | ICD-10-CM | POA: Insufficient documentation

## 2020-04-17 LAB — SARS CORONAVIRUS 2 (TAT 6-24 HRS): SARS Coronavirus 2: NEGATIVE

## 2020-04-18 ENCOUNTER — Ambulatory Visit (HOSPITAL_COMMUNITY): Payer: Managed Care, Other (non HMO)

## 2020-04-18 ENCOUNTER — Other Ambulatory Visit (HOSPITAL_COMMUNITY): Payer: Self-pay | Admitting: *Deleted

## 2020-04-18 ENCOUNTER — Encounter (HOSPITAL_COMMUNITY): Payer: Self-pay | Admitting: Internal Medicine

## 2020-04-18 ENCOUNTER — Encounter (HOSPITAL_COMMUNITY)
Admission: RE | Disposition: A | Discharge status: Home / Self Care | Payer: Self-pay | Source: Home / Self Care | Attending: Internal Medicine

## 2020-04-18 ENCOUNTER — Other Ambulatory Visit: Payer: Self-pay

## 2020-04-18 ENCOUNTER — Ambulatory Visit (HOSPITAL_BASED_OUTPATIENT_CLINIC_OR_DEPARTMENT_OTHER): Payer: Managed Care, Other (non HMO)

## 2020-04-18 ENCOUNTER — Ambulatory Visit (HOSPITAL_COMMUNITY): Payer: Managed Care, Other (non HMO) | Admitting: Certified Registered Nurse Anesthetist

## 2020-04-18 ENCOUNTER — Ambulatory Visit (HOSPITAL_COMMUNITY)
Admission: RE | Admit: 2020-04-18 | Discharge: 2020-04-18 | Disposition: A | Discharge status: Home / Self Care | Payer: Managed Care, Other (non HMO) | Attending: Internal Medicine | Admitting: Internal Medicine

## 2020-04-18 DIAGNOSIS — Z8249 Family history of ischemic heart disease and other diseases of the circulatory system: Secondary | ICD-10-CM | POA: Insufficient documentation

## 2020-04-18 DIAGNOSIS — G4733 Obstructive sleep apnea (adult) (pediatric): Secondary | ICD-10-CM | POA: Insufficient documentation

## 2020-04-18 DIAGNOSIS — I340 Nonrheumatic mitral (valve) insufficiency: Secondary | ICD-10-CM

## 2020-04-18 DIAGNOSIS — I420 Dilated cardiomyopathy: Secondary | ICD-10-CM | POA: Insufficient documentation

## 2020-04-18 DIAGNOSIS — Z79899 Other long term (current) drug therapy: Secondary | ICD-10-CM | POA: Insufficient documentation

## 2020-04-18 DIAGNOSIS — I428 Other cardiomyopathies: Secondary | ICD-10-CM | POA: Insufficient documentation

## 2020-04-18 DIAGNOSIS — Z6837 Body mass index (BMI) 37.0-37.9, adult: Secondary | ICD-10-CM | POA: Insufficient documentation

## 2020-04-18 DIAGNOSIS — I110 Hypertensive heart disease with heart failure: Secondary | ICD-10-CM | POA: Diagnosis not present

## 2020-04-18 DIAGNOSIS — I5022 Chronic systolic (congestive) heart failure: Secondary | ICD-10-CM | POA: Diagnosis not present

## 2020-04-18 DIAGNOSIS — I082 Rheumatic disorders of both aortic and tricuspid valves: Secondary | ICD-10-CM | POA: Insufficient documentation

## 2020-04-18 DIAGNOSIS — E785 Hyperlipidemia, unspecified: Secondary | ICD-10-CM | POA: Diagnosis not present

## 2020-04-18 DIAGNOSIS — I351 Nonrheumatic aortic (valve) insufficiency: Secondary | ICD-10-CM

## 2020-04-18 DIAGNOSIS — J811 Chronic pulmonary edema: Secondary | ICD-10-CM

## 2020-04-18 DIAGNOSIS — F17210 Nicotine dependence, cigarettes, uncomplicated: Secondary | ICD-10-CM | POA: Insufficient documentation

## 2020-04-18 HISTORY — PX: TEE WITHOUT CARDIOVERSION: SHX5443

## 2020-04-18 LAB — POCT I-STAT, CHEM 8
BUN: 24 mg/dL — ABNORMAL HIGH (ref 6–20)
Calcium, Ion: 1.07 mmol/L — ABNORMAL LOW (ref 1.15–1.40)
Chloride: 102 mmol/L (ref 98–111)
Creatinine, Ser: 1.1 mg/dL (ref 0.61–1.24)
Glucose, Bld: 112 mg/dL — ABNORMAL HIGH (ref 70–99)
HCT: 49 % (ref 39.0–52.0)
Hemoglobin: 16.7 g/dL (ref 13.0–17.0)
Potassium: 4 mmol/L (ref 3.5–5.1)
Sodium: 140 mmol/L (ref 135–145)
TCO2: 30 mmol/L (ref 22–32)

## 2020-04-18 SURGERY — ECHOCARDIOGRAM, TRANSESOPHAGEAL
Anesthesia: General

## 2020-04-18 MED ORDER — PROPOFOL 500 MG/50ML IV EMUL
INTRAVENOUS | Status: DC | PRN
  Administered 2020-04-18: 150 ug/kg/min via INTRAVENOUS

## 2020-04-18 MED ORDER — PHENYLEPHRINE HCL (PRESSORS) 10 MG/ML IV SOLN
INTRAVENOUS | Status: DC | PRN
  Administered 2020-04-18: 80 ug via INTRAVENOUS

## 2020-04-18 MED ORDER — BUTAMBEN-TETRACAINE-BENZOCAINE 2-2-14 % EX AERO
INHALATION_SPRAY | CUTANEOUS | Status: DC | PRN
  Administered 2020-04-18: 13:00:00 2 via TOPICAL

## 2020-04-18 MED ORDER — SODIUM CHLORIDE 0.9 % IV SOLN: INTRAVENOUS | Status: DC

## 2020-04-18 MED ORDER — ALBUTEROL SULFATE (2.5 MG/3ML) 0.083% IN NEBU
2.5000 mg | INHALATION_SOLUTION | Freq: Once | RESPIRATORY_TRACT | Status: AC
  Administered 2020-04-18: 2.5 mg via RESPIRATORY_TRACT

## 2020-04-18 MED ORDER — ALBUTEROL SULFATE (2.5 MG/3ML) 0.083% IN NEBU
INHALATION_SOLUTION | RESPIRATORY_TRACT | Status: AC
  Filled 2020-04-18: qty 3

## 2020-04-18 MED ORDER — PROPOFOL 10 MG/ML IV BOLUS
INTRAVENOUS | Status: DC | PRN
  Administered 2020-04-18: 40 mg via INTRAVENOUS

## 2020-04-18 NOTE — Anesthesia Preprocedure Evaluation (Signed)
Anesthesia Evaluation  Patient identified by MRN, date of birth, ID band Patient awake    Reviewed: Allergy & Precautions, NPO status , Patient's Chart, lab work & pertinent test results  Airway Mallampati: II  TM Distance: >3 FB     Dental   Pulmonary Current Smoker and Patient abstained from smoking.,    breath sounds clear to auscultation       Cardiovascular hypertension, +CHF   Rhythm:Regular Rate:Normal     Neuro/Psych PSYCHIATRIC DISORDERS    GI/Hepatic negative GI ROS, Neg liver ROS,   Endo/Other  negative endocrine ROS  Renal/GU negative Renal ROS     Musculoskeletal   Abdominal   Peds  Hematology   Anesthesia Other Findings   Reproductive/Obstetrics                             Anesthesia Physical Anesthesia Plan  ASA: III  Anesthesia Plan: General   Post-op Pain Management:    Induction: Intravenous  PONV Risk Score and Plan: 1 and Propofol infusion  Airway Management Planned: Nasal Cannula and Simple Face Mask  Additional Equipment:   Intra-op Plan:   Post-operative Plan:   Informed Consent: I have reviewed the patients History and Physical, chart, labs and discussed the procedure including the risks, benefits and alternatives for the proposed anesthesia with the patient or authorized representative who has indicated his/her understanding and acceptance.     Dental advisory given  Plan Discussed with: CRNA  Anesthesia Plan Comments:         Anesthesia Quick Evaluation

## 2020-04-18 NOTE — Progress Notes (Signed)
  Echocardiogram Echocardiogram Transesophageal has been performed.  Colin Walton 04/18/2020, 2:22 PM

## 2020-04-18 NOTE — CV Procedure (Signed)
    TRANSESOPHAGEAL ECHOCARDIOGRAM   NAME:  Colin Walton   MRN: [id number] DOB:  Aug 14, 1971   ADMIT DATE: 04/18/2020  INDICATIONS:  Aortic valve regurgitation  PROCEDURE:   Informed consent was obtained prior to the procedure. The risks, benefits and alternatives for the procedure were discussed and the patient comprehended these risks.  Risks include, but are not limited to, cough, sore throat, vomiting, nausea, somnolence, esophageal and stomach trauma or perforation, bleeding, low blood pressure, aspiration, pneumonia, infection, trauma to the teeth and death.    After a procedural time-out, the patient was sedated by the anesthesia service with propofol and precedex.  The transesophageal probe was inserted easily into the esophagus on the first pass without any resistance.   Patient immediately had a mild amount of bright red blood on the scope which was suctioned and cleared. We took some initial pictures and then the patient developed severe laryngospasm with poor air movement. His sats dropped and ETCO2 rose quickly. I removed the scope and he was bagged by the CRNA and recovered. The probe was then re-inserted easily to try and complete the study but he has a significant amount of air in his GI tract from bagging and we were unable to get good images.   He then developed recurrent respiratory distress. I removed the scope and all sedation was discontinued. After about 15 minutes of bagging by the CRNA and with Dr. Chilton Si from anesthesia in the room his sats came up from 60% back into the 90s. Patient was then alert and oriented but still with cough and some laryngospasm. He was given an albuterol treatment with improvement.   CXR with massive cardiomegaly but clear lungs.    COMPLICATIONS:    See above  FINDINGS:  LEFT VENTRICLE:  Markedly dilated EF = 25%. Global HK.  RIGHT VENTRICLE: Moderately decreased function  LEFT ATRIUM: Dilated  LEFT ATRIAL APPENDAGE: Not  visualized  RIGHT ATRIUM: Dilated   AORTIC VALVE:  Trileaflet. Appears to be moderate eccentric AI (incomplete interrogation due to respiratory distress)  MITRAL VALVE:    Normal. Mild MR  TRICUSPID VALVE: Normal. Severe TR  PULMONIC VALVE: Grossly normal.  INTERATRIAL SEPTUM: No PFO or ASD.  PERICARDIUM: No effusion  DESCENDING AORTA: Not visualized    Truman Hayward 2:34 PM

## 2020-04-18 NOTE — Anesthesia Procedure Notes (Addendum)
Procedure Name: MAC Date/Time: 04/18/2020 1:13 PM Performed by: Leonor Liv, CRNA Pre-anesthesia Checklist: Patient identified, Emergency Drugs available, Suction available, Patient being monitored and Timeout performed Oxygen Delivery Method: Nasal cannula Airway Equipment and Method: Bite block Placement Confirmation: positive ETCO2

## 2020-04-18 NOTE — Transfer of Care (Signed)
Immediate Anesthesia Transfer of Care Note  Patient: Colin Walton  Procedure(s) Performed: TRANSESOPHAGEAL ECHOCARDIOGRAM (TEE) (N/A )  Patient Location: Endoscopy Unit  Anesthesia Type:MAC  Level of Consciousness: awake, alert  and oriented  Airway & Oxygen Therapy: Patient Spontanous Breathing and Patient connected to nasal cannula oxygen  Post-op Assessment: Report given to RN, Post -op Vital signs reviewed and stable and Patient moving all extremities  Post vital signs: Reviewed and stable  Last Vitals:  Vitals Value Taken Time  BP 122/67 04/18/20 1435  Temp 36.4 C 04/18/20 1426  Pulse 83 04/18/20 1440  Resp 30 04/18/20 1440  SpO2 93 % 04/18/20 1440  Vitals shown include unvalidated device data.  Last Pain:  Vitals:   04/18/20 1435  TempSrc:   PainSc: 0-No pain         Complications: Patient awake and spontaneously breathing, denies SOB. VSS. X-ray to bedside for chest image.

## 2020-04-18 NOTE — Anesthesia Postprocedure Evaluation (Signed)
Anesthesia Post Note  Patient: Colin Walton  Procedure(s) Performed: TRANSESOPHAGEAL ECHOCARDIOGRAM (TEE) (N/A )     Patient location during evaluation: Endoscopy Anesthesia Type: General Level of consciousness: awake Pain management: pain level controlled Vital Signs Assessment: post-procedure vital signs reviewed and stable Respiratory status: spontaneous breathing Cardiovascular status: stable Postop Assessment: no apparent nausea or vomiting Anesthetic complications: yes    Last Vitals:  Vitals:   04/18/20 1505 04/18/20 1515  BP: 135/75 (!) 146/77  Pulse: 89 89  Resp: 13 (!) 29  Temp:    SpO2: 100% 90%    Last Pain:  Vitals:   04/18/20 1515  TempSrc:   PainSc: 0-No pain                 Tamsin Nader

## 2020-04-18 NOTE — Discharge Instructions (Signed)

## 2020-04-18 NOTE — Interval H&P Note (Signed)
History and Physical Interval Note:  04/18/2020 1:16 PM  Colin Walton  has presented today for surgery, with the diagnosis of AORTIC VALVE REGRUGITATION.  The various methods of treatment have been discussed with the patient and family. After consideration of risks, benefits and other options for treatment, the patient has consented to  Procedure(s): TRANSESOPHAGEAL ECHOCARDIOGRAM (TEE) (N/A) as a surgical intervention.  The patient's history has been reviewed, patient examined, no change in status, stable for surgery.  I have reviewed the patient's chart and labs.  Questions were answered to the patient's satisfaction.     Greycen Felter

## 2020-04-28 ENCOUNTER — Telehealth (HOSPITAL_COMMUNITY): Payer: Self-pay | Admitting: *Deleted

## 2020-04-28 NOTE — Telephone Encounter (Signed)
Pt called requesting results of TEE . I do not see results for TEE.  Routed to Dr.Bensimhons nurse Meredith Staggers

## 2020-06-27 NOTE — Progress Notes (Signed)
ADVANCED HF CLINIC NOTE  Referring Physician: Primary Care: Primary Cardiologist: Clifton James  HPI:  Colin Walton) is a 49 y.o. male with a hx of morbid obesity, hypertension, chronic systolic heart failure, due to nonischemic cardiomyopathy, moderate aortic insufficiency, OSA on CPAP, and alcohol use. Referred by Dr. Clifton James to establish care in the HF Clinic  He was first diagnosed with CHF in 2013 during a hospitalization. Echo revealed EF of 40-45%. Follow up sleep study revealed severe OSA prompting CPAP therapy (followed by Dr. Mayford Knife). He was also noted to have uncontrolled hypertension. Right and left heart cath 03/2013 with no obstructive CAD and an EF of 55%. Unfortunately, repeat echo 11/2015 revealed and EF of 35-40% and diffuse hypokinesis, trivial AI, and moderately dilated LA.   Echo on  11/22/19 revealed an EF of 30-35% with LVH, mildly dilated left atrium, moderate aortic valve regurgitation, and elevated pulmonary pressure. Echo was reviewed and TEE was recommended to better evaluate the aortic valve.   Underwent TEE on 04/18/20. Procedure c/b by severe respiratory distress and laryngospasm. LVEF 25% RV moderately HK. Aortic valve not interrogated completely due to respiratory distress. AI appeared to be moderate  Here for f/u. Still working FT at [employer]. Feels ok. SOB at times with moderate activity. Occasional edema. Taking his BP 2x/week and SBP 150.  Says he is taking all his meds. Drinking a bottle of wine on off days. Occasional gin. Smoking 7 cigs/day. Wears CPAP every night. No CP.    Brother died in 05-24-2023 from alcolohlism.   Echo 04/13/20 EF 20-25% Moderate RV dysfunction (worse from previous) Mild AI - reviewed personally   Past Medical History:  Diagnosis Date  . Chronic systolic CHF (congestive heart failure) (HCC)   . Hyperlipidemia   . Hypertension   . Mild mitral regurgitation 2016  . NICM (nonischemic cardiomyopathy)  (HCC)   . Obesity   . Sleep apnea 12/24/12   Sleep study February 2014    Current Outpatient Medications  Medication Sig Dispense Refill  . amLODipine (NORVASC) 10 MG tablet Take 1 tablet (10 mg total) by mouth daily. 30 tablet 3  . atorvastatin (LIPITOR) 10 MG tablet Take 1 tablet (10 mg total) by mouth daily. 30 tablet 3  . carvedilol (COREG) 25 MG tablet Take 1 tablet (25 mg total) by mouth 2 (two) times daily. 60 tablet 3  . colchicine 0.6 MG tablet Take 2 tablets at first sign of gout flare and then 1 tablet an hour later.  Repeat every 3 days until gout flare subsides. (Patient taking differently: Take 0.6-1.2 mg by mouth See admin instructions. Take 1.2 mg at first sign of gout flare and then 0.6 mg an hour later.  Repeat every 3 days until gout flare subsides.) 12 tablet 1  . hydrALAZINE (APRESOLINE) 100 MG tablet Take 1 tablet (100 mg total) by mouth 3 (three) times daily. 90 tablet 6  . isosorbide mononitrate (IMDUR) 60 MG 24 hr tablet Take 1 tablet (60 mg total) by mouth daily. 30 tablet 11  . loratadine (CLARITIN) 10 MG tablet Take 10 mg by mouth daily as needed for allergies.    . Potassium Chloride ER 20 MEQ TBCR Take 20 mEq by mouth in the morning and at bedtime.     . sacubitril-valsartan (ENTRESTO) 97-103 MG Take 1 tablet by mouth 2 (two) times daily. 60 tablet 11  . spironolactone (ALDACTONE) 25 MG tablet Take 1 tablet (25 mg total) by mouth  daily. 30 tablet 11  . torsemide (DEMADEX) 20 MG tablet Take 20 mg by mouth daily.     No current facility-administered medications for this encounter.    No Known Allergies    Social History   Socioeconomic History  . Marital status: Single    Spouse name: Not on file  . Number of children: 3  . Years of education: Not on file  . Highest education level: Not on file  Occupational History  . Occupation: Works with Copywriter, advertising: [employer]  Tobacco Use  . Smoking status: Light Tobacco Smoker    Packs/day: 0.05     Years: 22.00    Pack years: 1.10    Types: Cigarettes  . Smokeless tobacco: Never Used  Vaping Use  . Vaping Use: Never used  Substance and Sexual Activity  . Alcohol use: No    Alcohol/week: 30.0 standard drinks    Types: 30 Standard drinks or equivalent per week    Comment: x 1 week  . Drug use: No    Types: Marijuana  . Sexual activity: Not on file  Other Topics Concern  . Not on file  Social History Narrative  . Not on file   Social Determinants of Health   Financial Resource Strain:   . Difficulty of Paying Living Expenses:   Food Insecurity:   . Worried About Programme researcher, broadcasting/film/video in the Last Year:   . Barista in the Last Year:   Transportation Needs:   . Freight forwarder (Medical):   Marland Kitchen Lack of Transportation (Non-Medical):   Physical Activity:   . Days of Exercise per Week:   . Minutes of Exercise per Session:   Stress:   . Feeling of Stress :   Social Connections:   . Frequency of Communication with Friends and Family:   . Frequency of Social Gatherings with Friends and Family:   . Attends Religious Services:   . Active Member of Clubs or Organizations:   . Attends Banker Meetings:   Marland Kitchen Marital Status:   Intimate Partner Violence:   . Fear of Current or Ex-Partner:   . Emotionally Abused:   Marland Kitchen Physically Abused:   . Sexually Abused:       Family History  Problem Relation Age of Onset  . Arrhythmia Maternal Grandfather        Atrial fibrillation  . CAD Neg Hx     Vitals:   06/28/20 0931  BP: (!) 156/94  Pulse: (!) 104  SpO2: 98%  Weight: 104.8 kg (231 lb)    PHYSICAL EXAM: General:  Well appearing. No resp difficulty HEENT: normal Neck: supple. no JVD. Carotids 2+ bilat; no bruits. No lymphadenopathy or thryomegaly appreciated. Cor: PMI nondisplaced. Regular tachy. 2/6 AI Lungs: clear Abdomen: obese soft, nontender, nondistended. No hepatosplenomegaly. No bruits or masses. Good bowel sounds. Extremities: no  cyanosis, clubbing, rash, edema Neuro: alert & orientedx3, cranial nerves grossly intact. moves all 4 extremities w/o difficulty. Affect pleasant     ASSESSMENT & PLAN:  1. Chronic systolic heart failure - due to NICM - suspect due to HTN and OSA +/- ETOH - cath 03/2013 no CAD - Echo 11/20 EF 30-35% - Echo 4/21 EF 20-25% moderate RV dysfunction (worse since previous) - Personally reviewed - TEE on 04/18/20. Procedure c/b by severe respiratory distress and laryngospasm. LVEF 25% RV moderately HK. Aortic valve not interrogated completely due to respiratory distress. AI appeared to be moderate.  Severe TR -  Stable NYHA II  - Volume status looks ok - Continue torsemide 40 bid - Continue Entresto 97/103 bid - Continue carvedilol 25 bid - Increase hydralazine to 100 tid (was taking bid). Continue imdur - Start Farxiga 10.  - TEE reviewed with him personally. Biventricular dysfunction not due to valvular disease. Suspect HTN, OSA and ETOH.  - Stressed need for full compliance with meds and complete abstinence from ETOH - check cMRI - We discussed possible need for advanced therapies and need to be off ETOH and tobacco - Arrange Paramedicine f/u - If cMRI unrevealing plan R/L cath at next visit.   - Eventual ICD once off ETOH  2. Moderate aortic regurgitation - Visually mild to moderate on TEE 4/21 but P1/2 time suggesting very severe AI.  - TEE on 04/18/20. Procedure c/b by severe respiratory distress and laryngospasm. LVEF 25% RV moderately HK. Aortic valve not interrogated completely due to respiratory distress. AI appeared to be moderate - TEE reviewed with echo team felt to be moderate at worst  3. Hypertension - BP high - changes as above  4. OSA on CPAP - Continue CPAP - Refer to Dr. Mayford Knife to make sure therapy is effective  5. Obesity. - needs weight loss  6. Tobacco use/ETOH - he understands need to quit  Total time spent 45 minutes. Over half that time spent  discussing above.    Arvilla Meres, MD  4:56 PM

## 2020-06-28 ENCOUNTER — Encounter (HOSPITAL_COMMUNITY): Payer: Self-pay | Admitting: Internal Medicine

## 2020-06-28 ENCOUNTER — Other Ambulatory Visit: Payer: Self-pay

## 2020-06-28 ENCOUNTER — Telehealth: Payer: Self-pay | Admitting: Licensed Clinical Social Worker

## 2020-06-28 ENCOUNTER — Ambulatory Visit (HOSPITAL_COMMUNITY)
Admission: RE | Admit: 2020-06-28 | Discharge: 2020-06-28 | Disposition: A | Discharge status: Home / Self Care | Payer: Managed Care, Other (non HMO) | Source: Ambulatory Visit | Attending: Internal Medicine | Admitting: Internal Medicine

## 2020-06-28 VITALS — BP 156/94 | HR 104 | Wt 231.0 lb

## 2020-06-28 DIAGNOSIS — I509 Heart failure, unspecified: Secondary | ICD-10-CM

## 2020-06-28 DIAGNOSIS — E785 Hyperlipidemia, unspecified: Secondary | ICD-10-CM | POA: Diagnosis not present

## 2020-06-28 DIAGNOSIS — I428 Other cardiomyopathies: Secondary | ICD-10-CM | POA: Diagnosis not present

## 2020-06-28 DIAGNOSIS — I10 Essential (primary) hypertension: Secondary | ICD-10-CM | POA: Diagnosis not present

## 2020-06-28 DIAGNOSIS — F1010 Alcohol abuse, uncomplicated: Secondary | ICD-10-CM

## 2020-06-28 DIAGNOSIS — I082 Rheumatic disorders of both aortic and tricuspid valves: Secondary | ICD-10-CM | POA: Diagnosis not present

## 2020-06-28 DIAGNOSIS — Z9989 Dependence on other enabling machines and devices: Secondary | ICD-10-CM | POA: Insufficient documentation

## 2020-06-28 DIAGNOSIS — F17210 Nicotine dependence, cigarettes, uncomplicated: Secondary | ICD-10-CM | POA: Insufficient documentation

## 2020-06-28 DIAGNOSIS — I110 Hypertensive heart disease with heart failure: Secondary | ICD-10-CM | POA: Diagnosis not present

## 2020-06-28 DIAGNOSIS — G4733 Obstructive sleep apnea (adult) (pediatric): Secondary | ICD-10-CM | POA: Diagnosis not present

## 2020-06-28 DIAGNOSIS — Z720 Tobacco use: Secondary | ICD-10-CM

## 2020-06-28 DIAGNOSIS — Z79899 Other long term (current) drug therapy: Secondary | ICD-10-CM | POA: Insufficient documentation

## 2020-06-28 DIAGNOSIS — I5022 Chronic systolic (congestive) heart failure: Secondary | ICD-10-CM | POA: Diagnosis present

## 2020-06-28 DIAGNOSIS — I351 Nonrheumatic aortic (valve) insufficiency: Secondary | ICD-10-CM

## 2020-06-28 LAB — CBC
HCT: 48.7 % (ref 39.0–52.0)
Hemoglobin: 15.3 g/dL (ref 13.0–17.0)
MCH: 26.9 pg (ref 26.0–34.0)
MCHC: 31.4 g/dL (ref 30.0–36.0)
MCV: 85.7 fL (ref 80.0–100.0)
Platelets: 170 10*3/uL (ref 150–400)
RBC: 5.68 MIL/uL (ref 4.22–5.81)
RDW: 19.3 % — ABNORMAL HIGH (ref 11.5–15.5)
WBC: 3.7 10*3/uL — ABNORMAL LOW (ref 4.0–10.5)
nRBC: 0 % (ref 0.0–0.2)

## 2020-06-28 LAB — COMPREHENSIVE METABOLIC PANEL
ALT: 17 U/L (ref 0–44)
AST: 29 U/L (ref 15–41)
Albumin: 3.3 g/dL — ABNORMAL LOW (ref 3.5–5.0)
Alkaline Phosphatase: 134 U/L — ABNORMAL HIGH (ref 38–126)
Anion gap: 15 (ref 5–15)
BUN: 13 mg/dL (ref 6–20)
CO2: 26 mmol/L (ref 22–32)
Calcium: 8.9 mg/dL (ref 8.9–10.3)
Chloride: 98 mmol/L (ref 98–111)
Creatinine, Ser: 1.04 mg/dL (ref 0.61–1.24)
GFR calc Af Amer: >60 mL/min (ref >60)
GFR calc non Af Amer: >60 mL/min (ref >60)
Glucose, Bld: 119 mg/dL — ABNORMAL HIGH (ref 70–99)
Potassium: 3.2 mmol/L — ABNORMAL LOW (ref 3.5–5.1)
Sodium: 139 mmol/L (ref 135–145)
Total Bilirubin: 2.8 mg/dL — ABNORMAL HIGH (ref 0.3–1.2)
Total Protein: 7.3 g/dL (ref 6.5–8.1)

## 2020-06-28 LAB — BRAIN NATRIURETIC PEPTIDE: B Natriuretic Peptide: 598.8 pg/mL — ABNORMAL HIGH (ref 0.0–100.0)

## 2020-06-28 MED ORDER — DAPAGLIFLOZIN PROPANEDIOL 10 MG PO TABS
10.0000 mg | ORAL_TABLET | Freq: Every day | ORAL | 11 refills | Status: AC
Start: 2020-06-28 — End: ?

## 2020-06-28 MED ORDER — HYDRALAZINE HCL 100 MG PO TABS: 100.0000 mg | ORAL_TABLET | Freq: Three times a day (TID) | ORAL | 6 refills | Status: AC

## 2020-06-28 NOTE — Telephone Encounter (Signed)
CSW received consult for pt to be added to [employer].  CSW called pt to discuss program but unable to reach- left VM requesting return call  Will continue to follow and assist as needed  Burna Sis, LCSW Clinical Social Worker [facility] Desk#: [phone] Cell#: [phone]

## 2020-06-28 NOTE — Patient Instructions (Signed)
INCREASE Hydralazine to 100 mg, one tab three times daily START Farxiga 10 mg ,one tab daily  Labs today We will only contact you if something comes back abnormal or we need to make some changes. Otherwise no news is good news!  Your physician has requested that you have a cardiac MRI. Cardiac MRI uses a computer to create images of your heart as its beating, producing both still and moving pictures of your heart and major blood vessels. For further information please visit [email or web address]. Please follow the instruction sheet given to you today for more information. -once approved with your insurance, cardiac imaging schedulers will be in touch to schedule  You have been referred to [facility] Dr Mayford Knife [address] [place], [place] [address] [phone]  You have been referred to [facility]-to assist with HF management They will be in touch to arrange a home visit  Your physician recommends that you schedule a follow-up appointment in: 3-4 months with Dr Gala Romney   Do the following things EVERYDAY: 1) Weigh yourself in the morning before breakfast. Write it down and keep it in a log. 2) Take your medicines as prescribed 3) Eat low salt foods--Limit salt (sodium) to 2000 mg per day.  4) Stay as active as you can everyday 5) Limit all fluids for the day to less than 2 liters

## 2020-07-06 ENCOUNTER — Telehealth (HOSPITAL_COMMUNITY): Payer: Self-pay | Admitting: Licensed Clinical Social Worker

## 2020-07-06 NOTE — Telephone Encounter (Signed)
CSW attempted to reach out to pt to discuss [employer] program- unable to reach- left VM requesting return call  Burna Sis, LCSW Clinical Social Worker [facility] Desk#: [phone] Cell#: [phone]

## 2020-07-12 ENCOUNTER — Encounter (HOSPITAL_COMMUNITY): Payer: Self-pay | Admitting: *Deleted

## 2020-07-12 ENCOUNTER — Telehealth (HOSPITAL_COMMUNITY): Payer: Self-pay | Admitting: Licensed Clinical Social Worker

## 2020-07-12 NOTE — Telephone Encounter (Signed)
CSW called pt again to try and discuss [employer] program.  No answer- left VM requesting return call.  Will not attempt to reach out again at this time- if pt makes contact with CSW or the clinic can attempt again to establish with paramedicine  Burna Sis, LCSW Clinical Social Worker [facility] Desk#: [phone] Cell#: [phone]

## 2020-07-14 ENCOUNTER — Encounter (HOSPITAL_COMMUNITY): Payer: Self-pay | Admitting: *Deleted

## 2020-08-18 ENCOUNTER — Telehealth (HOSPITAL_COMMUNITY): Payer: Self-pay | Admitting: Emergency Medicine

## 2020-08-18 NOTE — Telephone Encounter (Signed)
Attempted to call patient regarding upcoming cardiac MR appointment. Left message on voicemail with name and callback number Redith Drach RN Navigator Cardiac Imaging [facility] [phone] Office [phone] Cell  

## 2020-08-21 ENCOUNTER — Ambulatory Visit (HOSPITAL_COMMUNITY)
Admission: RE | Admit: 2020-08-21 | Discharge: 2020-08-21 | Disposition: A | Discharge status: Home / Self Care | Payer: Managed Care, Other (non HMO) | Source: Ambulatory Visit | Attending: Internal Medicine | Admitting: Internal Medicine

## 2020-08-21 ENCOUNTER — Other Ambulatory Visit: Payer: Self-pay

## 2020-08-21 DIAGNOSIS — I509 Heart failure, unspecified: Secondary | ICD-10-CM

## 2020-08-21 MED ORDER — GADOBUTROL 1 MMOL/ML IV SOLN
10.0000 mL | Freq: Once | INTRAVENOUS | Status: AC | PRN
  Administered 2020-08-21: 10 mL via INTRAVENOUS

## 2020-08-21 NOTE — Progress Notes (Signed)
Date:  08/22/2020   ID:  Tessie Fass, DOB 11/15/1971, MRN [id number]   PCP:  Shanna Cisco, NP  Cardiologist:  Verne Carrow, MD  Sleep Medicine: Armanda Magic, MD Electrophysiologist:  None   Chief Complaint:  OSA  History of Present Illness:    This is a 49yo male with a hx of OSA, NICM and chornic systolic CHF.  He had a sleep study in 2014 that showed severe OSA with an AHI of 115/hr and was initially started on CPAP but due to ongoing respiratory events he was titrated on BIPAP and has been on a setting of 14/73mmHg.    He is doing well with his CPAP device and thinks that he has gotten used to it.  He tolerates the mask and feels the pressure is adequate.  Since going on CPAP he feels rested in the am and has no significant daytime sleepiness.  He denies any significant mouth or nasal dryness or nasal congestion.  He does not think that he snores.     Prior CV studies:   The following studies were reviewed today:  PAP compliance download  Past Medical History:  Diagnosis Date  . Chronic systolic CHF (congestive heart failure) (HCC)   . Hyperlipidemia   . Hypertension   . Mild mitral regurgitation 2016  . NICM (nonischemic cardiomyopathy) (HCC)   . Obesity   . Sleep apnea 12/24/12   Sleep study February 2014   Past Surgical History:  Procedure Laterality Date  . TEE WITHOUT CARDIOVERSION N/A 04/18/2020   Procedure: TRANSESOPHAGEAL ECHOCARDIOGRAM (TEE);  Surgeon: Dolores Patty, MD;  Location: [facility] ENDOSCOPY;  Service: Cardiovascular;  Laterality: N/A;  . VASECTOMY       Current Meds  Medication Sig  . amLODipine (NORVASC) 10 MG tablet Take 1 tablet (10 mg total) by mouth daily.  Marland Kitchen atorvastatin (LIPITOR) 10 MG tablet Take 1 tablet (10 mg total) by mouth daily.  . carvedilol (COREG) 25 MG tablet Take 1 tablet (25 mg total) by mouth 2 (two) times daily.  . colchicine 0.6 MG tablet Take 2 tablets at first sign of gout flare and then 1 tablet an hour  later.  Repeat every 3 days until gout flare subsides. (Patient taking differently: Take 0.6-1.2 mg by mouth See admin instructions. Take 1.2 mg at first sign of gout flare and then 0.6 mg an hour later.  Repeat every 3 days until gout flare subsides.)  . dapagliflozin propanediol (FARXIGA) 10 MG TABS tablet Take 1 tablet (10 mg total) by mouth daily before breakfast.  . hydrALAZINE (APRESOLINE) 100 MG tablet Take 1 tablet (100 mg total) by mouth 3 (three) times daily.  . isosorbide mononitrate (IMDUR) 60 MG 24 hr tablet Take 1 tablet (60 mg total) by mouth daily.  Marland Kitchen loratadine (CLARITIN) 10 MG tablet Take 10 mg by mouth daily as needed for allergies.  . Potassium Chloride ER 20 MEQ TBCR Take 20 mEq by mouth in the morning and at bedtime.   . sacubitril-valsartan (ENTRESTO) 97-103 MG Take 1 tablet by mouth 2 (two) times daily.  Marland Kitchen spironolactone (ALDACTONE) 25 MG tablet Take 1 tablet (25 mg total) by mouth daily.  Marland Kitchen torsemide (DEMADEX) 20 MG tablet Take 20 mg by mouth daily.  . Vitamin D, Ergocalciferol, (DRISDOL) 1.25 MG (50000 UNIT) CAPS capsule Take 50,000 Units by mouth once a week.     Allergies:   Patient has no known allergies.   Social History   Tobacco Use  . Smoking  status: Light Tobacco Smoker    Packs/day: 0.05    Years: 22.00    Pack years: 1.10    Types: Cigarettes  . Smokeless tobacco: Never Used  Vaping Use  . Vaping Use: Never used  Substance Use Topics  . Alcohol use: No    Alcohol/week: 30.0 standard drinks    Types: 30 Standard drinks or equivalent per week    Comment: x 1 week  . Drug use: No    Types: Marijuana     Family Hx: The patient's family history includes Arrhythmia in his maternal grandfather. There is no history of CAD.  ROS:   Please see the history of present illness.     All other systems reviewed and are negative.   Labs/Other Tests and Data Reviewed:    Recent Labs: 11/22/2019: NT-Pro BNP 1,711; TSH 1.700 06/28/2020: ALT 17; B  Natriuretic Peptide 598.8; BUN 13; Creatinine, Ser 1.04; Hemoglobin 15.3; Platelets 170; Potassium 3.2; Sodium 139   Recent Lipid Panel Lab Results  Component Value Date/Time   CHOL 125 11/22/2019 09:07 AM   TRIG 106 11/22/2019 09:07 AM   HDL 32 (L) 11/22/2019 09:07 AM   CHOLHDL 3.9 11/22/2019 09:07 AM   CHOLHDL 4.3 12/23/2012 05:15 AM   LDLCALC 73 11/22/2019 09:07 AM    Wt Readings from Last 3 Encounters:  08/22/20 233 lb 3.2 oz (105.8 kg)  06/28/20 231 lb (104.8 kg)  04/18/20 240 lb (108.9 kg)     Objective:    Vital Signs:  BP (!) 174/96   Pulse 100   Ht 5\' 7"  (1.702 m)   Wt 233 lb 3.2 oz (105.8 kg)   SpO2 92%   BMI 36.52 kg/m    GEN: Well nourished, well developed in no acute distress HEENT: Normal NECK: No JVD; No carotid bruits LYMPHATICS: No lymphadenopathy CARDIAC:RRR, no murmurs, rubs, gallops RESPIRATORY:  Clear to auscultation without rales, wheezing or rhonchi  ABDOMEN: Soft, non-tender, non-distended MUSCULOSKELETAL:  No edema; No deformity  SKIN: Warm and dry NEUROLOGIC:  Alert and oriented x 3 PSYCHIATRIC:  Normal affect    ASSESSMENT & PLAN:    1. OSA -  The patient is tolerating PAP therapy well without any problems. The patient has been using and benefiting from PAP use and will continue to benefit from therapy. For some reason his device is not providing a download. I will have [facility] check his machine to see if they can figure out what is wrong with it.  I would also like them to show him how to use the app.    2.  HTN -BP is well controlled on exam today but he has not taken his meds yet -I have asked him to check his BP daily for a week and call with the results -continue amlodipine 10mg  daily, Carvedilol 25mg  BID, Hydralazine 100mg  TID and Entrestro 97-103mg  BID and spiro 25mg  daily  3.  Obesity -I have encouraged him to get into a routine exercise program and cut back on carbs and portions.    Medication Adjustments/Labs and Tests  Ordered: Current medicines are reviewed at length with the patient today.  Concerns regarding medicines are outlined above.  Tests Ordered: No orders of the defined types were placed in this encounter.  Medication Changes: No orders of the defined types were placed in this encounter.   Disposition:  Follow up after sleep study  Signed, , MD  08/22/2020 9:11 AM    [facility]

## 2020-08-22 ENCOUNTER — Ambulatory Visit: Payer: Managed Care, Other (non HMO) | Admitting: Cardiology

## 2020-08-22 ENCOUNTER — Encounter: Payer: Self-pay | Admitting: Cardiology

## 2020-08-22 VITALS — BP 174/96 | HR 100 | Ht 67.0 in | Wt 233.2 lb

## 2020-08-22 DIAGNOSIS — E669 Obesity, unspecified: Secondary | ICD-10-CM

## 2020-08-22 DIAGNOSIS — I10 Essential (primary) hypertension: Secondary | ICD-10-CM

## 2020-08-22 DIAGNOSIS — G4733 Obstructive sleep apnea (adult) (pediatric): Secondary | ICD-10-CM

## 2020-08-22 NOTE — Patient Instructions (Signed)
Please check your blood pressure daily for one week and send [place] a message with your readings.   Medication Instructions:  Your physician recommends that you continue on your current medications as directed. Please refer to the Current Medication list given to you today.  *If you need a refill on your cardiac medications before your next appointment, please call your pharmacy*   Follow-Up: At [facility], you and your health needs are our priority.  As part of our continuing mission to provide you with exceptional heart care, we have created designated Provider Care Teams.  These Care Teams include your primary Cardiologist (physician) and Advanced Practice Providers (APPs -  Physician Assistants and Nurse Practitioners) who all work together to provide you with the care you need, when you need it.  Your next appointment:   1 year(s)  The format for your next appointment:   Virtual Visit   Provider:   Armanda Magic, MD

## 2020-08-29 ENCOUNTER — Telehealth: Payer: Self-pay | Admitting: *Deleted

## 2020-08-29 NOTE — Telephone Encounter (Signed)
-----   Message from Quintella Reichert, MD sent at 08/22/2020  9:13 AM EDT ----- [name]  There is something wrong with his device.  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 Please call [facility] >> they need to check his device and get this working so he does not lose his machine.

## 2020-08-29 NOTE — Telephone Encounter (Signed)
Reached out to patient and left message to return my call after I spoke to [place] who states the patient needs to bring his chip in for a download. I also spoke to the [facility] sleep team who will ask the respiratory therapist to contact the patient to help trouble shoot the problem. Christoper Allegra has been made aware the patient works 3rd shift.

## 2020-09-13 NOTE — Progress Notes (Signed)
PATIENT DID NOT SHOW FOR APPT.  NOTE LEFT FOR TEMPLATING PURPOSES ONLY.       ADVANCED HF CLINIC NOTE  Referring Physician: Primary Care: Primary Cardiologist: Clifton James  HPI:  Colin Walton) is a 49 y.o. male with a hx of morbid obesity, hypertension, chronic systolic heart failure, due to nonischemic cardiomyopathy, moderate aortic insufficiency, OSA on CPAP, and alcohol use. Referred by Dr. Clifton James to establish care in the HF Clinic  He was first diagnosed with CHF in 2013 during a hospitalization. Echo revealed EF of 40-45%. Follow up sleep study revealed severe OSA prompting CPAP therapy (followed by Dr. Mayford Knife). He was also noted to have uncontrolled hypertension. Right and left heart cath 03/2013 with no obstructive CAD and an EF of 55%. Unfortunately, repeat echo 11/2015 revealed and EF of 35-40% and diffuse hypokinesis, trivial AI, and moderately dilated LA.   Echo on 11/22/19 revealed an EF of 30-35% with LVH, mildly dilated left atrium, moderate aortic valve regurgitation, and elevated pulmonary pressure. Echo was reviewed and TEE was recommended to better evaluate the aortic valve.   Underwent TEE on 04/18/20. Procedure c/b by severe respiratory distress and laryngospasm. LVEF 25% RV moderately HK. Aortic valve not interrogated completely due to respiratory distress. AI appeared to be moderate  cMRI 8/21 1.  Severely dilated LV with diffuse hypokinesis, EF 24%. 2.  Mildly dilated RV with moderate systolic dysfunction, EF 32%. 3. Moderate aortic insufficiency with aortic valve regurgitant fraction 42%. 4.  Difficult images, but no definite LGE.  Here for f/u. Still working FT at [employer]. Feels ok. SOB at times with moderate activity. Occasional edema. Taking his BP 2x/week and SBP 150.  Says he is taking all his meds. Drinking a bottle of wine on off days. Occasional gin. Smoking 7 cigs/day. Wears CPAP every night. No CP.     Brother died in May 28, 2023 from alcolohlism.   Echo 04/13/20 EF 20-25% Moderate RV dysfunction (worse from previous) Mild AI - reviewed personally   Past Medical History:  Diagnosis Date  . Chronic systolic CHF (congestive heart failure) (HCC)   . Hyperlipidemia   . Hypertension   . Mild mitral regurgitation 2016  . NICM (nonischemic cardiomyopathy) (HCC)   . Obesity   . Sleep apnea 12/24/12   Sleep study February 2014    Current Outpatient Medications  Medication Sig Dispense Refill  . amLODipine (NORVASC) 10 MG tablet Take 1 tablet (10 mg total) by mouth daily. 30 tablet 3  . atorvastatin (LIPITOR) 10 MG tablet Take 1 tablet (10 mg total) by mouth daily. 30 tablet 3  . carvedilol (COREG) 25 MG tablet Take 1 tablet (25 mg total) by mouth 2 (two) times daily. 60 tablet 3  . colchicine 0.6 MG tablet Take 2 tablets at first sign of gout flare and then 1 tablet an hour later.  Repeat every 3 days until gout flare subsides. (Patient taking differently: Take 0.6-1.2 mg by mouth See admin instructions. Take 1.2 mg at first sign of gout flare and then 0.6 mg an hour later.  Repeat every 3 days until gout flare subsides.) 12 tablet 1  . dapagliflozin propanediol (FARXIGA) 10 MG TABS tablet Take 1 tablet (10 mg total) by mouth daily before breakfast. 30 tablet 11  . hydrALAZINE (APRESOLINE) 100 MG tablet Take 1 tablet (100 mg total) by mouth 3 (three) times daily. 90 tablet 6  . isosorbide mononitrate (IMDUR) 60 MG 24 hr tablet  Take 1 tablet (60 mg total) by mouth daily. 30 tablet 11  . loratadine (CLARITIN) 10 MG tablet Take 10 mg by mouth daily as needed for allergies.    . Potassium Chloride ER 20 MEQ TBCR Take 20 mEq by mouth in the morning and at bedtime.     . sacubitril-valsartan (ENTRESTO) 97-103 MG Take 1 tablet by mouth 2 (two) times daily. 60 tablet 11  . spironolactone (ALDACTONE) 25 MG tablet Take 1 tablet (25 mg total) by mouth daily. 30 tablet 11  . torsemide (DEMADEX) 20 MG tablet Take 20  mg by mouth daily.    . Vitamin D, Ergocalciferol, (DRISDOL) 1.25 MG (50000 UNIT) CAPS capsule Take 50,000 Units by mouth once a week.     No current facility-administered medications for this encounter.    No Known Allergies    Social History   Socioeconomic History  . Marital status: Single    Spouse name: Not on file  . Number of children: 3  . Years of education: Not on file  . Highest education level: Not on file  Occupational History  . Occupation: Works with Copywriter, advertising: [employer]  Tobacco Use  . Smoking status: Light Tobacco Smoker    Packs/day: 0.05    Years: 22.00    Pack years: 1.10    Types: Cigarettes  . Smokeless tobacco: Never Used  Vaping Use  . Vaping Use: Never used  Substance and Sexual Activity  . Alcohol use: No    Alcohol/week: 30.0 standard drinks    Types: 30 Standard drinks or equivalent per week    Comment: x 1 week  . Drug use: No    Types: Marijuana  . Sexual activity: Not on file  Other Topics Concern  . Not on file  Social History Narrative  . Not on file   Social Determinants of Health   Financial Resource Strain:   . Difficulty of Paying Living Expenses: Not on file  Food Insecurity:   . Worried About Programme researcher, broadcasting/film/video in the Last Year: Not on file  . Ran Out of Food in the Last Year: Not on file  Transportation Needs:   . Lack of Transportation (Medical): Not on file  . Lack of Transportation (Non-Medical): Not on file  Physical Activity:   . Days of Exercise per Week: Not on file  . Minutes of Exercise per Session: Not on file  Stress:   . Feeling of Stress : Not on file  Social Connections:   . Frequency of Communication with Friends and Family: Not on file  . Frequency of Social Gatherings with Friends and Family: Not on file  . Attends Religious Services: Not on file  . Active Member of Clubs or Organizations: Not on file  . Attends Banker Meetings: Not on file  . Marital Status: Not on file    Intimate Partner Violence:   . Fear of Current or Ex-Partner: Not on file  . Emotionally Abused: Not on file  . Physically Abused: Not on file  . Sexually Abused: Not on file      Family History  Problem Relation Age of Onset  . Arrhythmia Maternal Grandfather        Atrial fibrillation  . CAD Neg Hx     There were no vitals filed for this visit.  PHYSICAL EXAM: General:  Well appearing. No resp difficulty HEENT: normal Neck: supple. no JVD. Carotids 2+ bilat; no bruits. No lymphadenopathy  or thryomegaly appreciated. Cor: PMI nondisplaced. Regular tachy. 2/6 AI Lungs: clear Abdomen: obese soft, nontender, nondistended. No hepatosplenomegaly. No bruits or masses. Good bowel sounds. Extremities: no cyanosis, clubbing, rash, edema Neuro: alert & orientedx3, cranial nerves grossly intact. moves all 4 extremities w/o difficulty. Affect pleasant     ASSESSMENT & PLAN:  1. Chronic systolic heart failure - due to NICM - suspect due to HTN and OSA +/- ETOH - cath 03/2013 no CAD - Echo 11/20 EF 30-35% - Echo 4/21 EF 20-25% moderate RV dysfunction (worse since previous) - Personally reviewed - TEE on 04/18/20. Procedure c/b by severe respiratory distress and laryngospasm. LVEF 25% RV moderately HK. Aortic valve not interrogated completely due to respiratory distress. AI appeared to be moderate. Severe TR - cMRI 8/21  EF 24%. RVEF 32%. Mo -  Stable NYHA II  - Volume status looks ok - Continue torsemide 40 bid - Continue Entresto 97/103 bid - Continue carvedilol 25 bid - Continue hydralazine 100 tid (was taking bid). Continue imdur - Continue Farxiga 10.  - Stressed need for full compliance with meds and complete abstinence from ETOH - We discussed possible need for advanced therapies and need to be off ETOH and tobacco - Arrange Paramedicine f/u - If cMRI unrevealing plan R/L cath at next visit.   - Eventual ICD once off ETOH  2. Moderate aortic regurgitation - Visually mild  to moderate on TEE 4/21 but P1/2 time suggesting very severe AI.  - TEE on 04/18/20. Procedure c/b by severe respiratory distress and laryngospasm. LVEF 25% RV moderately HK. Aortic valve not interrogated completely due to respiratory distress. AI appeared to be moderate - TEE and cMRI confirm moderate AI  3. Hypertension - BP high - changes as above  4. OSA on CPAP - PSG in 2014 with AHI 115/hr - Continue BiPAP - Has seen Dr. Mayford Knife. Unable to download device  5. Obesity. - needs weight loss  6. Tobacco use/ETOH - he understands need to quit   Arvilla Meres, MD  10:36 PM

## 2020-09-14 ENCOUNTER — Inpatient Hospital Stay (HOSPITAL_BASED_OUTPATIENT_CLINIC_OR_DEPARTMENT_OTHER)
Admission: RE | Admit: 2020-09-14 | Discharge: 2020-09-14 | Disposition: A | Discharge status: Home / Self Care | Payer: Managed Care, Other (non HMO) | Source: Ambulatory Visit | Attending: Internal Medicine | Admitting: Internal Medicine

## 2020-09-14 DIAGNOSIS — I5022 Chronic systolic (congestive) heart failure: Secondary | ICD-10-CM

## 2020-11-01 ENCOUNTER — Encounter (HOSPITAL_COMMUNITY): Payer: Managed Care, Other (non HMO) | Admitting: Internal Medicine

## 2020-11-20 ENCOUNTER — Encounter (HOSPITAL_COMMUNITY): Payer: Self-pay | Admitting: Internal Medicine

## 2020-11-20 ENCOUNTER — Other Ambulatory Visit: Payer: Self-pay

## 2020-11-20 ENCOUNTER — Ambulatory Visit (HOSPITAL_COMMUNITY)
Admission: RE | Admit: 2020-11-20 | Discharge: 2020-11-20 | Disposition: A | Discharge status: Home / Self Care | Payer: Managed Care, Other (non HMO) | Source: Ambulatory Visit | Attending: Internal Medicine | Admitting: Internal Medicine

## 2020-11-20 VITALS — BP 210/100 | HR 110 | Wt 225.8 lb

## 2020-11-20 DIAGNOSIS — I5022 Chronic systolic (congestive) heart failure: Secondary | ICD-10-CM | POA: Diagnosis present

## 2020-11-20 DIAGNOSIS — I428 Other cardiomyopathies: Secondary | ICD-10-CM | POA: Insufficient documentation

## 2020-11-20 DIAGNOSIS — F17210 Nicotine dependence, cigarettes, uncomplicated: Secondary | ICD-10-CM | POA: Insufficient documentation

## 2020-11-20 DIAGNOSIS — I082 Rheumatic disorders of both aortic and tricuspid valves: Secondary | ICD-10-CM | POA: Insufficient documentation

## 2020-11-20 DIAGNOSIS — I110 Hypertensive heart disease with heart failure: Secondary | ICD-10-CM | POA: Diagnosis not present

## 2020-11-20 DIAGNOSIS — Z79899 Other long term (current) drug therapy: Secondary | ICD-10-CM | POA: Diagnosis not present

## 2020-11-20 DIAGNOSIS — E785 Hyperlipidemia, unspecified: Secondary | ICD-10-CM | POA: Diagnosis not present

## 2020-11-20 DIAGNOSIS — E669 Obesity, unspecified: Secondary | ICD-10-CM | POA: Diagnosis not present

## 2020-11-20 DIAGNOSIS — F1010 Alcohol abuse, uncomplicated: Secondary | ICD-10-CM | POA: Diagnosis not present

## 2020-11-20 DIAGNOSIS — I10 Essential (primary) hypertension: Secondary | ICD-10-CM | POA: Diagnosis not present

## 2020-11-20 DIAGNOSIS — G4733 Obstructive sleep apnea (adult) (pediatric): Secondary | ICD-10-CM | POA: Diagnosis not present

## 2020-11-20 DIAGNOSIS — I351 Nonrheumatic aortic (valve) insufficiency: Secondary | ICD-10-CM

## 2020-11-20 DIAGNOSIS — Z720 Tobacco use: Secondary | ICD-10-CM

## 2020-11-20 MED ORDER — DIGOXIN 125 MCG PO TABS
0.1250 mg | ORAL_TABLET | Freq: Every day | ORAL | 3 refills | Status: AC
Start: 2020-11-20 — End: ?

## 2020-11-20 MED ORDER — COLCHICINE 0.6 MG PO TABS: ORAL_TABLET | ORAL | 1 refills | Status: AC

## 2020-11-20 NOTE — Progress Notes (Signed)
ADVANCED HF CLINIC NOTE  Referring Physician: Primary Care: Primary Cardiologist: Clifton James  HPI:  Colin Walton) is a 49 y.o. male with a hx of morbid obesity, hypertension, chronic systolic heart failure, due to nonischemic cardiomyopathy, moderate aortic insufficiency, OSA on CPAP, and alcohol use. Referred by Dr. Clifton James to establish care in the HF Clinic  He was first diagnosed with CHF in 2013 during a hospitalization. Echo revealed EF of 40-45%. Follow up sleep study revealed severe OSA prompting CPAP therapy (followed by Dr. Mayford Knife). He was also noted to have uncontrolled hypertension. Right and left heart cath 03/2013 with no obstructive CAD and an EF of 55%. Unfortunately, repeat echo 11/2015 revealed and EF of 35-40% and diffuse hypokinesis, trivial AI, and moderately dilated LA.   Echo on  11/22/19 revealed an EF of 30-35% with LVH, mildly dilated left atrium, moderate aortic valve regurgitation, and elevated pulmonary pressure. Echo was reviewed and TEE was recommended to better evaluate the aortic valve.   Underwent TEE on 04/18/20. Procedure c/b by severe respiratory distress and laryngospasm. LVEF 25% RV moderately HK. Aortic valve not interrogated completely due to respiratory distress. AI appeared to be moderate   Brother died in 2023/05/31 from alcolohlism.   Echo 04/13/20 EF 20-25% Moderate RV dysfunction (worse from previous) Mild AI - reviewed personally   cMRI 8/21 1.  Severely dilated LV with diffuse hypokinesis, EF 24%. 2.  Mildly dilated RV with moderate systolic dysfunction, EF 32%. 3. Moderate aortic insufficiency with aortic valve regurgitant fraction 42%. 4.  Difficult images, but no definite LGE. 5. Mildly elevated extracellular volume percentage, not in range for amyloidosis or active myocarditis.  Here for routine f/u. Still working FT at [employer]. Feels good. Says he is not drinking anymore. Walks around his apartment  complex 2x/day. No CP. Mild SOB when he is busy at work - walks up 16 steps 4x/night at work. Mild edema that he attributes to gout. Wearing CPAP every night. Smoking 10-15 cigs/day. Has been complaint with meds but went out of town for 9 days to [place] and forgot meds. Restarted this am.    Past Medical History:  Diagnosis Date  . Chronic systolic CHF (congestive heart failure) (HCC)   . Hyperlipidemia   . Hypertension   . Mild mitral regurgitation 2016  . NICM (nonischemic cardiomyopathy) (HCC)   . Obesity   . Sleep apnea 12/24/12   Sleep study February 2014    Current Outpatient Medications  Medication Sig Dispense Refill  . amLODipine (NORVASC) 10 MG tablet Take 1 tablet (10 mg total) by mouth daily. 30 tablet 3  . atorvastatin (LIPITOR) 10 MG tablet Take 1 tablet (10 mg total) by mouth daily. 30 tablet 3  . carvedilol (COREG) 25 MG tablet Take 1 tablet (25 mg total) by mouth 2 (two) times daily. 60 tablet 3  . colchicine 0.6 MG tablet Take 2 tablets at first sign of gout flare and then 1 tablet an hour later.  Repeat every 3 days until gout flare subsides. 12 tablet 1  . dapagliflozin propanediol (FARXIGA) 10 MG TABS tablet Take 1 tablet (10 mg total) by mouth daily before breakfast. 30 tablet 11  . hydrALAZINE (APRESOLINE) 100 MG tablet Take 1 tablet (100 mg total) by mouth 3 (three) times daily. 90 tablet 6  . isosorbide mononitrate (IMDUR) 60 MG 24 hr tablet Take 1 tablet (60 mg total) by mouth daily. 30 tablet 11  . loratadine (CLARITIN) 10 MG  tablet Take 10 mg by mouth daily as needed for allergies.    . Potassium Chloride ER 20 MEQ TBCR Take 20 mEq by mouth in the morning and at bedtime.     . sacubitril-valsartan (ENTRESTO) 97-103 MG Take 1 tablet by mouth 2 (two) times daily. 60 tablet 11  . spironolactone (ALDACTONE) 25 MG tablet Take 1 tablet (25 mg total) by mouth daily. 30 tablet 11  . torsemide (DEMADEX) 20 MG tablet Take 20 mg by mouth daily.    . Vitamin D,  Ergocalciferol, (DRISDOL) 1.25 MG (50000 UNIT) CAPS capsule Take 50,000 Units by mouth once a week.     No current facility-administered medications for this encounter.    No Known Allergies    Social History   Socioeconomic History  . Marital status: Single    Spouse name: Not on file  . Number of children: 3  . Years of education: Not on file  . Highest education level: Not on file  Occupational History  . Occupation: Works with Copywriter, advertising: [employer]  Tobacco Use  . Smoking status: Light Tobacco Smoker    Packs/day: 0.05    Years: 22.00    Pack years: 1.10    Types: Cigarettes  . Smokeless tobacco: Never Used  Vaping Use  . Vaping Use: Never used  Substance and Sexual Activity  . Alcohol use: No    Alcohol/week: 30.0 standard drinks    Types: 30 Standard drinks or equivalent per week    Comment: x 1 week  . Drug use: No    Types: Marijuana  . Sexual activity: Not on file  Other Topics Concern  . Not on file  Social History Narrative  . Not on file   Social Determinants of Health   Financial Resource Strain:   . Difficulty of Paying Living Expenses: Not on file  Food Insecurity:   . Worried About Programme researcher, broadcasting/film/video in the Last Year: Not on file  . Ran Out of Food in the Last Year: Not on file  Transportation Needs:   . Lack of Transportation (Medical): Not on file  . Lack of Transportation (Non-Medical): Not on file  Physical Activity:   . Days of Exercise per Week: Not on file  . Minutes of Exercise per Session: Not on file  Stress:   . Feeling of Stress : Not on file  Social Connections:   . Frequency of Communication with Friends and Family: Not on file  . Frequency of Social Gatherings with Friends and Family: Not on file  . Attends Religious Services: Not on file  . Active Member of Clubs or Organizations: Not on file  . Attends Banker Meetings: Not on file  . Marital Status: Not on file  Intimate Partner Violence:   .  Fear of Current or Ex-Partner: Not on file  . Emotionally Abused: Not on file  . Physically Abused: Not on file  . Sexually Abused: Not on file      Family History  Problem Relation Age of Onset  . Arrhythmia Maternal Grandfather        Atrial fibrillation  . CAD Neg Hx     Vitals:   11/20/20 1525  BP: (!) 210/100  Pulse: (!) 110  SpO2: 94%  Weight: 102.4 kg (225 lb 12.8 oz)    PHYSICAL EXAM: General:  Well appearing. No resp difficulty HEENT: normal Neck: supple. JVP to jaw  Carotids 2+ bilat; no bruits. No  lymphadenopathy or thryomegaly appreciated. Cor: PMI nondisplaced. Regular tachy +s4  2/6 AI  Lungs: clear Abdomen: soft, nontender, nondistended. No hepatosplenomegaly. No bruits or masses. Good bowel sounds. Extremities: no cyanosis, clubbing, rash, edema Neuro: alert & orientedx3, cranial nerves grossly intact. moves all 4 extremities w/o difficulty. Affect pleasant    ASSESSMENT & PLAN:  1. Chronic systolic heart failure - due to NICM - suspect due to HTN and OSA +/- ETOH - cath 03/2013 no CAD - Echo 11/20 EF 30-35% - Echo 4/21 EF 20-25% moderate RV dysfunction (worse since previous) - Personally reviewed - TEE on 04/18/20. Procedure c/b by severe respiratory distress and laryngospasm. LVEF 25% RV moderately HK. Aortic valve not interrogated completely due to respiratory distress. AI appeared to be moderate. Severe TR - cMRI 8/21 EF 24% Moderate AI. No LGE or infiltrative process,  - Symptomatically stable NYHA II but is markedly hypertensive today with mild volume overload after being out of meds for 9 days - Will have him resume all meds below and see back next week. Call immediately if worse - Continue torsemide 40 bid - Continue Entresto 97/103 bid - Continue carvedilol 25 bid - Continue hydralazine 100 tidContinue imdur - Continue Farxiga 10.  - Add digoxin 0.125   - Suspect CM related to HTN +/- ETOH. AI may be contributing but severity of biventricular  dysfunction out of proportion to AI - He now reports that he has been off ETOH completely for 1 months - We discussed possible need for advanced therapies and need to be off ETOH and tobacco - Eventual ICD once consistently on meds and free of ETOH  2. Moderate aortic regurgitation - Visually mild to moderate on TEE 4/21 but P1/2 time suggesting severe AI.  - TEE on 04/18/20. Procedure c/b by severe respiratory distress and laryngospasm. LVEF 25% RV moderately HK. Aortic valve not interrogated completely due to respiratory distress. AI appeared to be moderate - TEE reviewed with echo team felt to be moderate at worst  3. Hypertension - BP high after being out of meds x 9 days - Plan as above   4. OSA on CPAP - Continue CPAP - Follows with Dr. Mayford Knife.  5. Obesity. - needs weight loss  6. Tobacco use/ETOH - discussed need to quit/stay quit again  Total time spent 35 minutes. Over half that time spent discussing above.     Arvilla Meres, MD  3:47 PM

## 2020-11-20 NOTE — Patient Instructions (Signed)
Start Digoxin 0.125 mg Daily  Please restart all medications  Your physician recommends that you schedule a follow-up appointment in: 1 week  If you have any questions or concerns before your next appointment please send [place] a message through [place] or call our office at [phone].    TO LEAVE A MESSAGE FOR THE NURSE SELECT OPTION 2, PLEASE LEAVE A MESSAGE INCLUDING: . YOUR NAME . DATE OF BIRTH . CALL BACK NUMBER . REASON FOR CALL**this is important as we prioritize the call backs  YOU WILL RECEIVE A CALL BACK THE SAME DAY AS LONG AS YOU CALL BEFORE 4:00 PM  At the [facility], you and your health needs are our priority. As part of our continuing mission to provide you with exceptional heart care, we have created designated Provider Care Teams. These Care Teams include your primary Cardiologist (physician) and Advanced Practice Providers (APPs- Physician Assistants and Nurse Practitioners) who all work together to provide you with the care you need, when you need it.   You may see any of the following providers on your designated Care Team at your next follow up: Marland Kitchen Dr Arvilla Meres . Dr Marca Ancona . Tonye Becket, NP . Robbie Lis, PA . Karle Plumber, PharmD   Please be sure to bring in all your medications bottles to every appointment.

## 2020-11-29 ENCOUNTER — Ambulatory Visit (HOSPITAL_COMMUNITY)
Admission: RE | Admit: 2020-11-29 | Discharge: 2020-11-29 | Disposition: A | Discharge status: Home / Self Care | Payer: Managed Care, Other (non HMO) | Source: Ambulatory Visit | Attending: Internal Medicine | Admitting: Internal Medicine

## 2020-11-29 ENCOUNTER — Other Ambulatory Visit: Payer: Self-pay

## 2020-11-29 VITALS — BP 160/95 | HR 109 | Wt 224.2 lb

## 2020-11-29 DIAGNOSIS — G4733 Obstructive sleep apnea (adult) (pediatric): Secondary | ICD-10-CM | POA: Insufficient documentation

## 2020-11-29 DIAGNOSIS — Z9989 Dependence on other enabling machines and devices: Secondary | ICD-10-CM | POA: Diagnosis not present

## 2020-11-29 DIAGNOSIS — I428 Other cardiomyopathies: Secondary | ICD-10-CM | POA: Diagnosis not present

## 2020-11-29 DIAGNOSIS — I110 Hypertensive heart disease with heart failure: Secondary | ICD-10-CM | POA: Diagnosis present

## 2020-11-29 DIAGNOSIS — I351 Nonrheumatic aortic (valve) insufficiency: Secondary | ICD-10-CM | POA: Diagnosis not present

## 2020-11-29 DIAGNOSIS — Z6835 Body mass index (BMI) 35.0-35.9, adult: Secondary | ICD-10-CM | POA: Diagnosis not present

## 2020-11-29 DIAGNOSIS — Z79899 Other long term (current) drug therapy: Secondary | ICD-10-CM | POA: Insufficient documentation

## 2020-11-29 DIAGNOSIS — Z7289 Other problems related to lifestyle: Secondary | ICD-10-CM | POA: Insufficient documentation

## 2020-11-29 DIAGNOSIS — F1010 Alcohol abuse, uncomplicated: Secondary | ICD-10-CM | POA: Diagnosis not present

## 2020-11-29 DIAGNOSIS — Z7984 Long term (current) use of oral hypoglycemic drugs: Secondary | ICD-10-CM | POA: Diagnosis not present

## 2020-11-29 DIAGNOSIS — I509 Heart failure, unspecified: Secondary | ICD-10-CM

## 2020-11-29 DIAGNOSIS — F17210 Nicotine dependence, cigarettes, uncomplicated: Secondary | ICD-10-CM | POA: Diagnosis not present

## 2020-11-29 DIAGNOSIS — I5022 Chronic systolic (congestive) heart failure: Secondary | ICD-10-CM | POA: Insufficient documentation

## 2020-11-29 LAB — BASIC METABOLIC PANEL
Anion gap: 12 (ref 5–15)
BUN: 13 mg/dL (ref 6–20)
CO2: 24 mmol/L (ref 22–32)
Calcium: 9.2 mg/dL (ref 8.9–10.3)
Chloride: 104 mmol/L (ref 98–111)
Creatinine, Ser: 1.05 mg/dL (ref 0.61–1.24)
GFR, Estimated: >60 mL/min (ref >60)
Glucose, Bld: 103 mg/dL — ABNORMAL HIGH (ref 70–99)
Potassium: 3.6 mmol/L (ref 3.5–5.1)
Sodium: 140 mmol/L (ref 135–145)

## 2020-11-29 LAB — BRAIN NATRIURETIC PEPTIDE: B Natriuretic Peptide: 687.9 pg/mL — ABNORMAL HIGH (ref 0.0–100.0)

## 2020-11-29 MED ORDER — CARVEDILOL 25 MG PO TABS: 37.5000 mg | ORAL_TABLET | Freq: Two times a day (BID) | ORAL | 3 refills | Status: DC

## 2020-11-29 NOTE — Progress Notes (Signed)
ADVANCED HF CLINIC NOTE  Referring Physician: Primary Care: Primary Cardiologist: Clifton James  HPI:  Colin Walton) is a 49 y.o. male with morbid obesity, hypertension, chronic systolic heart failure, due to nonischemic cardiomyopathy, moderate aortic insufficiency, OSA on CPAP, and alcohol use. Referred by Dr. Clifton James to establish care in the HF Clinic  He was first diagnosed with CHF in 2013 during a hospitalization. Echo revealed EF of 40-45%. Follow up sleep study revealed severe OSA prompting CPAP therapy (followed by Dr. Mayford Knife). He was also noted to have uncontrolled hypertension. Right and left heart cath 03/2013 with no obstructive CAD and an EF of 55%. Unfortunately, repeat echo 11/2015 revealed and EF of 35-40% and diffuse hypokinesis, trivial AI, and moderately dilated LA.   Echo on  11/22/19 revealed an EF of 30-35% with LVH, mildly dilated left atrium, moderate aortic valve regurgitation, and elevated pulmonary pressure. Echo was reviewed and TEE was recommended to better evaluate the aortic valve.   Underwent TEE on 04/18/20. Procedure c/b by severe respiratory distress and laryngospasm. LVEF 25% RV moderately HK. Aortic valve not interrogated completely due to respiratory distress. AI appeared to be moderate   Brother died in 05/23/23 from alcolohlism.   Echo 04/13/20 EF 20-25% Moderate RV dysfunction (worse from previous) Mild AI - reviewed personally   cMRI 8/21 1.  Severely dilated LV with diffuse hypokinesis, EF 24%. 2.  Mildly dilated RV with moderate systolic dysfunction, EF 32%. 3. Moderate aortic insufficiency with aortic valve regurgitant fraction 42%. 4.  Difficult images, but no definite LGE. 5. Mildly elevated extracellular volume percentage, not in range for amyloidosis or active myocarditis.  Here for routine f/u. Still working FT at [employer]. We saw him last week after he just got back from [place] and was out of medicines for  1 week. Fluid up and BP very high. We restarted meds and added digoxin. Feels much better. Breathing better. No edema. SBP down but still running 160-170 range. No orthopnea or PND. Smoking about 15 cigs/day. Wearing CPAP.    Past Medical History:  Diagnosis Date  . Chronic systolic CHF (congestive heart failure) (HCC)   . Hyperlipidemia   . Hypertension   . Mild mitral regurgitation 2016  . NICM (nonischemic cardiomyopathy) (HCC)   . Obesity   . Sleep apnea 12/24/12   Sleep study February 2014    Current Outpatient Medications  Medication Sig Dispense Refill  . amLODipine (NORVASC) 10 MG tablet Take 1 tablet (10 mg total) by mouth daily. 30 tablet 3  . atorvastatin (LIPITOR) 10 MG tablet Take 1 tablet (10 mg total) by mouth daily. 30 tablet 3  . carvedilol (COREG) 25 MG tablet Take 1 tablet (25 mg total) by mouth 2 (two) times daily. 60 tablet 3  . colchicine 0.6 MG tablet Take 2 tablets at first sign of gout flare and then 1 tablet an hour later.  Repeat every 3 days until gout flare subsides. 12 tablet 1  . dapagliflozin propanediol (FARXIGA) 10 MG TABS tablet Take 1 tablet (10 mg total) by mouth daily before breakfast. 30 tablet 11  . digoxin (LANOXIN) 0.125 MG tablet Take 1 tablet (0.125 mg total) by mouth daily. 30 tablet 3  . hydrALAZINE (APRESOLINE) 100 MG tablet Take 1 tablet (100 mg total) by mouth 3 (three) times daily. 90 tablet 6  . isosorbide mononitrate (IMDUR) 60 MG 24 hr tablet Take 1 tablet (60 mg total) by mouth daily. 30 tablet 11  .  loratadine (CLARITIN) 10 MG tablet Take 10 mg by mouth daily as needed for allergies.    . Potassium Chloride ER 20 MEQ TBCR Take 20 mEq by mouth in the morning and at bedtime.     . sacubitril-valsartan (ENTRESTO) 97-103 MG Take 1 tablet by mouth 2 (two) times daily. 60 tablet 11  . spironolactone (ALDACTONE) 25 MG tablet Take 1 tablet (25 mg total) by mouth daily. 30 tablet 11  . torsemide (DEMADEX) 20 MG tablet Take 20 mg by mouth daily.     . Vitamin D, Ergocalciferol, (DRISDOL) 1.25 MG (50000 UNIT) CAPS capsule Take 50,000 Units by mouth once a week.     No current facility-administered medications for this encounter.    No Known Allergies    Social History   Socioeconomic History  . Marital status: Single    Spouse name: Not on file  . Number of children: 3  . Years of education: Not on file  . Highest education level: Not on file  Occupational History  . Occupation: Works with Copywriter, advertising: [employer]  Tobacco Use  . Smoking status: Light Tobacco Smoker    Packs/day: 0.05    Years: 22.00    Pack years: 1.10    Types: Cigarettes  . Smokeless tobacco: Never Used  Vaping Use  . Vaping Use: Never used  Substance and Sexual Activity  . Alcohol use: No    Alcohol/week: 30.0 standard drinks    Types: 30 Standard drinks or equivalent per week    Comment: x 1 week  . Drug use: No    Types: Marijuana  . Sexual activity: Not on file  Other Topics Concern  . Not on file  Social History Narrative  . Not on file   Social Determinants of Health   Financial Resource Strain:   . Difficulty of Paying Living Expenses: Not on file  Food Insecurity:   . Worried About Programme researcher, broadcasting/film/video in the Last Year: Not on file  . Ran Out of Food in the Last Year: Not on file  Transportation Needs:   . Lack of Transportation (Medical): Not on file  . Lack of Transportation (Non-Medical): Not on file  Physical Activity:   . Days of Exercise per Week: Not on file  . Minutes of Exercise per Session: Not on file  Stress:   . Feeling of Stress : Not on file  Social Connections:   . Frequency of Communication with Friends and Family: Not on file  . Frequency of Social Gatherings with Friends and Family: Not on file  . Attends Religious Services: Not on file  . Active Member of Clubs or Organizations: Not on file  . Attends Banker Meetings: Not on file  . Marital Status: Not on file  Intimate Partner  Violence:   . Fear of Current or Ex-Partner: Not on file  . Emotionally Abused: Not on file  . Physically Abused: Not on file  . Sexually Abused: Not on file      Family History  Problem Relation Age of Onset  . Arrhythmia Maternal Grandfather        Atrial fibrillation  . CAD Neg Hx     Vitals:   11/29/20 1005  BP: (!) 160/95  Pulse: (!) 109  SpO2: 93%  Weight: 101.7 kg (224 lb 3.2 oz)   Wt Readings from Last 3 Encounters:  11/29/20 101.7 kg (224 lb 3.2 oz)  11/20/20 102.4 kg (225 lb  12.8 oz)  08/22/20 105.8 kg (233 lb 3.2 oz)    PHYSICAL EXAM: General:  Well appearing. No resp difficulty HEENT: normal Neck: supple. no JVD. Carotids 2+ bilat; no bruits. No lymphadenopathy or thryomegaly appreciated. Cor: PMI nondisplaced. Regular tachy 2/6 AI Lungs: clear Abdomen: obese soft, nontender, nondistended. No hepatosplenomegaly. No bruits or masses. Good bowel sounds. Extremities: no cyanosis, clubbing, rash, edema Neuro: alert & orientedx3, cranial nerves grossly intact. moves all 4 extremities w/o difficulty. Affect pleasant    ASSESSMENT & PLAN:  1. Chronic systolic heart failure - due to NICM - suspect due to HTN and OSA +/- ETOH - cath 03/2013 no CAD - Echo 11/20 EF 30-35% - Echo 4/21 EF 20-25% moderate RV dysfunction (worse since previous) - Personally reviewed - TEE on 04/18/20. Procedure c/b by severe respiratory distress and laryngospasm. LVEF 25% RV moderately HK. Aortic valve not interrogated completely due to respiratory distress. AI appeared to be moderate. Severe TR - cMRI 8/21 EF 24% Moderate AI. No LGE or infiltrative process,  - Symptomatically NYHA but EF low and remains quite tachycardic. I am very concerned about him  - Volume status improved. Continue torsemide 40 bid - Continue Entresto 97/103 bid - Increase carvedilol to 37.5 bid - Continue hydralazine 100 tid Continue imdur - Continue Farxiga 10.  - Continue digoxin 0.125   - Suspect CM related  to HTN +/- ETOH. AI may be contributing but severity of biventricular dysfunction out of proportion to AI - He now reports that he has been off ETOH completely for 1+ months  - We discussed possible need for advanced therapies and need to be off ETOH and tobacco - Eventual ICD once consistently on meds and free of ETOH - Continue to follow q 2-3 weeks. CPX soon  - labs today  2. Moderate aortic regurgitation - Visually mild to moderate on TEE 4/21 but P1/2 time suggesting severe AI.  - TEE on 04/18/20. Procedure c/b by severe respiratory distress and laryngospasm. LVEF 25% RV moderately HK. Aortic valve not interrogated completely due to respiratory distress. AI appeared to be moderate - TEE reviewed with echo team felt to be moderate at worst  3. Hypertension - BP remains high despite multi-drug regimen - increasing carvedilol   4. OSA on CPAP - Continue CPAP - Follows with Dr. Mayford Knife.  5. Obesity. - needs weight loss  6. Tobacco use/ETOH - again discussed need to quit/stay quit     Arvilla Meres, MD  11:03 AM

## 2020-11-29 NOTE — Patient Instructions (Addendum)
INCREASE Carvedilol 37.5mg  (1 1/2 tablets) Twice daily  Labs done today, your results will be available in MyChart, we will contact you for abnormal readings.  Your physician recommends that you schedule a follow-up appointment in: 2-3 weeks   Thursday 12/23 10:20am  If you have any questions or concerns before your next appointment please send [place] a message through [place] or call our office at [phone].    TO LEAVE A MESSAGE FOR THE NURSE SELECT OPTION 2, PLEASE LEAVE A MESSAGE INCLUDING: . YOUR NAME . DATE OF BIRTH . CALL BACK NUMBER . REASON FOR CALL**this is important as we prioritize the call backs  YOU WILL RECEIVE A CALL BACK THE SAME DAY AS LONG AS YOU CALL BEFORE 4:00 PM

## 2020-12-01 ENCOUNTER — Other Ambulatory Visit (HOSPITAL_COMMUNITY): Payer: Self-pay

## 2020-12-01 MED ORDER — CARVEDILOL 25 MG PO TABS: 37.5000 mg | ORAL_TABLET | Freq: Two times a day (BID) | ORAL | 3 refills | Status: DC

## 2020-12-14 ENCOUNTER — Encounter (HOSPITAL_COMMUNITY): Payer: Managed Care, Other (non HMO) | Admitting: Internal Medicine

## 2021-01-26 ENCOUNTER — Encounter (HOSPITAL_COMMUNITY): Payer: Self-pay | Admitting: Internal Medicine

## 2021-01-26 ENCOUNTER — Other Ambulatory Visit: Payer: Self-pay

## 2021-01-26 ENCOUNTER — Ambulatory Visit (HOSPITAL_COMMUNITY)
Admission: RE | Admit: 2021-01-26 | Discharge: 2021-01-26 | Disposition: A | Discharge status: Home / Self Care | Payer: Managed Care, Other (non HMO) | Source: Ambulatory Visit | Attending: Internal Medicine | Admitting: Internal Medicine

## 2021-01-26 VITALS — BP 162/82 | HR 106 | Wt 222.0 lb

## 2021-01-26 DIAGNOSIS — I428 Other cardiomyopathies: Secondary | ICD-10-CM | POA: Diagnosis not present

## 2021-01-26 DIAGNOSIS — I110 Hypertensive heart disease with heart failure: Secondary | ICD-10-CM | POA: Insufficient documentation

## 2021-01-26 DIAGNOSIS — I5022 Chronic systolic (congestive) heart failure: Secondary | ICD-10-CM | POA: Diagnosis not present

## 2021-01-26 DIAGNOSIS — I082 Rheumatic disorders of both aortic and tricuspid valves: Secondary | ICD-10-CM | POA: Insufficient documentation

## 2021-01-26 DIAGNOSIS — F17210 Nicotine dependence, cigarettes, uncomplicated: Secondary | ICD-10-CM | POA: Diagnosis not present

## 2021-01-26 DIAGNOSIS — F1010 Alcohol abuse, uncomplicated: Secondary | ICD-10-CM | POA: Diagnosis not present

## 2021-01-26 DIAGNOSIS — G4733 Obstructive sleep apnea (adult) (pediatric): Secondary | ICD-10-CM | POA: Diagnosis not present

## 2021-01-26 DIAGNOSIS — I10 Essential (primary) hypertension: Secondary | ICD-10-CM

## 2021-01-26 DIAGNOSIS — I491 Atrial premature depolarization: Secondary | ICD-10-CM | POA: Diagnosis not present

## 2021-01-26 DIAGNOSIS — I351 Nonrheumatic aortic (valve) insufficiency: Secondary | ICD-10-CM

## 2021-01-26 DIAGNOSIS — Z79899 Other long term (current) drug therapy: Secondary | ICD-10-CM | POA: Insufficient documentation

## 2021-01-26 DIAGNOSIS — J984 Other disorders of lung: Secondary | ICD-10-CM | POA: Diagnosis not present

## 2021-01-26 DIAGNOSIS — E785 Hyperlipidemia, unspecified: Secondary | ICD-10-CM | POA: Insufficient documentation

## 2021-01-26 DIAGNOSIS — Z6834 Body mass index (BMI) 34.0-34.9, adult: Secondary | ICD-10-CM | POA: Diagnosis not present

## 2021-01-26 DIAGNOSIS — Z720 Tobacco use: Secondary | ICD-10-CM

## 2021-01-26 DIAGNOSIS — I509 Heart failure, unspecified: Secondary | ICD-10-CM

## 2021-01-26 LAB — BASIC METABOLIC PANEL
Anion gap: 13 (ref 5–15)
BUN: 14 mg/dL (ref 6–20)
CO2: 21 mmol/L — ABNORMAL LOW (ref 22–32)
Calcium: 9.5 mg/dL (ref 8.9–10.3)
Chloride: 104 mmol/L (ref 98–111)
Creatinine, Ser: 1.07 mg/dL (ref 0.61–1.24)
GFR, Estimated: >60 mL/min (ref >60)
Glucose, Bld: 97 mg/dL (ref 70–99)
Potassium: 3.9 mmol/L (ref 3.5–5.1)
Sodium: 138 mmol/L (ref 135–145)

## 2021-01-26 LAB — CBC
HCT: 53.4 % — ABNORMAL HIGH (ref 39.0–52.0)
Hemoglobin: 17.5 g/dL — ABNORMAL HIGH (ref 13.0–17.0)
MCH: 27.8 pg (ref 26.0–34.0)
MCHC: 32.8 g/dL (ref 30.0–36.0)
MCV: 84.9 fL (ref 80.0–100.0)
Platelets: 188 10*3/uL (ref 150–400)
RBC: 6.29 MIL/uL — ABNORMAL HIGH (ref 4.22–5.81)
RDW: 18.8 % — ABNORMAL HIGH (ref 11.5–15.5)
WBC: 5.2 10*3/uL (ref 4.0–10.5)
nRBC: 0 % (ref 0.0–0.2)

## 2021-01-26 LAB — BRAIN NATRIURETIC PEPTIDE: B Natriuretic Peptide: 758.6 pg/mL — ABNORMAL HIGH (ref 0.0–100.0)

## 2021-01-26 MED ORDER — CARVEDILOL 25 MG PO TABS: 50.0000 mg | ORAL_TABLET | Freq: Two times a day (BID) | ORAL | 3 refills | Status: AC

## 2021-01-26 NOTE — Patient Instructions (Signed)
INCREASE Carvedilol 50mg  (2 tablets) twice daily  Your physician has requested that you regularly monitor and record your blood pressure readings at home. Please use the same machine at the same time of day to check your readings and record them to bring to your follow-up visit.  Labs done today, your results will be available in MyChart, we will contact you for abnormal readings.  Your physician recommends that you schedule a follow-up appointment in: 1 months with an echocardiogram  Your physician has requested that you have an echocardiogram. Echocardiography is a painless test that uses sound waves to create images of your heart. It provides your doctor with information about the size and shape of your heart and how well your heart's chambers and valves are working. This procedure takes approximately one hour. There are no restrictions for this procedure.   If you have any questions or concerns before your next appointment please send a message through [place] or call our office at [phone].    TO LEAVE A MESSAGE FOR THE NURSE SELECT OPTION 2, PLEASE LEAVE A MESSAGE INCLUDING: . YOUR NAME . DATE OF BIRTH . CALL BACK NUMBER . REASON FOR CALL**this is important as we prioritize the call backs  YOU WILL RECEIVE A CALL BACK THE SAME DAY AS LONG AS YOU CALL BEFORE 4:00 PM

## 2021-01-26 NOTE — Progress Notes (Signed)
ADVANCED HF CLINIC NOTE  Referring Physician: Primary Care: Primary Cardiologist: Clifton James  HPI:  Colin Walton) is a 50 y.o. male with morbid obesity, hypertension, chronic systolic heart failure, due to nonischemic cardiomyopathy, moderate aortic insufficiency, OSA on CPAP, and alcohol use. Referred by Dr. Clifton James to establish care in the HF Clinic  He was first diagnosed with CHF in 2013 during a hospitalization. Echo revealed EF of 40-45%. Follow up sleep study revealed severe OSA prompting CPAP therapy (followed by Dr. Mayford Knife). He was also noted to have uncontrolled hypertension. Right and left heart cath 03/2013 with no obstructive CAD and an EF of 55%. Unfortunately, repeat echo 11/2015 revealed and EF of 35-40% and diffuse hypokinesis, trivial AI, and moderately dilated LA.   Echo on  11/22/19 revealed an EF of 30-35% with LVH, mildly dilated left atrium, moderate aortic valve regurgitation, and elevated pulmonary pressure. Echo was reviewed and TEE was recommended to better evaluate the aortic valve.   Underwent TEE on 04/18/20. Procedure c/b by severe respiratory distress and laryngospasm. LVEF 25% RV moderately HK. Aortic valve not interrogated completely due to respiratory distress. AI appeared to be moderate   Brother died in 2023/05/27 from alcolohlism.   Echo 04/13/20 EF 20-25% Moderate RV dysfunction (worse from previous) Mild AI - reviewed personally   cMRI 8/21 1.  Severely dilated LV with diffuse hypokinesis, EF 24%. 2.  Mildly dilated RV with moderate systolic dysfunction, EF 32%. 3. Moderate aortic insufficiency with aortic valve regurgitant fraction 42%. 4.  Difficult images, but no definite LGE. 5. Mildly elevated extracellular volume percentage, not in range for amyloidosis or active myocarditis.  Here for routine f/u. Still working FT at [employer].At last visit carvedilol increased to 37.5 bid due to persistent severe HTN. Says he  feels pretty good. Gets SOB sometimes but not that often. No orthopnea or PND. No edema. Smoking almost a pack a day. Says he quit drinking completely.    Past Medical History:  Diagnosis Date  . Chronic systolic CHF (congestive heart failure) (HCC)   . Hyperlipidemia   . Hypertension   . Mild mitral regurgitation 2016  . NICM (nonischemic cardiomyopathy) (HCC)   . Obesity   . Sleep apnea 12/24/12   Sleep study February 2014    Current Outpatient Medications  Medication Sig Dispense Refill  . amLODipine (NORVASC) 10 MG tablet Take 1 tablet (10 mg total) by mouth daily. 30 tablet 3  . atorvastatin (LIPITOR) 10 MG tablet Take 1 tablet (10 mg total) by mouth daily. 30 tablet 3  . carvedilol (COREG) 25 MG tablet Take 1.5 tablets (37.5 mg total) by mouth 2 (two) times daily. 75 tablet 3  . colchicine 0.6 MG tablet Take 2 tablets at first sign of gout flare and then 1 tablet an hour later.  Repeat every 3 days until gout flare subsides. 12 tablet 1  . dapagliflozin propanediol (FARXIGA) 10 MG TABS tablet Take 1 tablet (10 mg total) by mouth daily before breakfast. 30 tablet 11  . digoxin (LANOXIN) 0.125 MG tablet Take 1 tablet (0.125 mg total) by mouth daily. 30 tablet 3  . hydrALAZINE (APRESOLINE) 100 MG tablet Take 1 tablet (100 mg total) by mouth 3 (three) times daily. 90 tablet 6  . isosorbide mononitrate (IMDUR) 60 MG 24 hr tablet Take 1 tablet (60 mg total) by mouth daily. 30 tablet 11  . loratadine (CLARITIN) 10 MG tablet Take 10 mg by mouth daily as needed for  allergies.    . Potassium Chloride ER 20 MEQ TBCR Take 20 mEq by mouth in the morning and at bedtime.     . sacubitril-valsartan (ENTRESTO) 97-103 MG Take 1 tablet by mouth 2 (two) times daily. 60 tablet 11  . spironolactone (ALDACTONE) 25 MG tablet Take 1 tablet (25 mg total) by mouth daily. 30 tablet 11  . torsemide (DEMADEX) 20 MG tablet Take 20 mg by mouth daily.    . Vitamin D, Ergocalciferol, (DRISDOL) 1.25 MG (50000 UNIT)  CAPS capsule Take 50,000 Units by mouth once a week.     No current facility-administered medications for this encounter.    No Known Allergies    Social History   Socioeconomic History  . Marital status: Single    Spouse name: Not on file  . Number of children: 3  . Years of education: Not on file  . Highest education level: Not on file  Occupational History  . Occupation: Works with Copywriter, advertising: [employer]  Tobacco Use  . Smoking status: Light Tobacco Smoker    Packs/day: 0.05    Years: 22.00    Pack years: 1.10    Types: Cigarettes  . Smokeless tobacco: Never Used  Vaping Use  . Vaping Use: Never used  Substance and Sexual Activity  . Alcohol use: No    Alcohol/week: 30.0 standard drinks    Types: 30 Standard drinks or equivalent per week    Comment: x 1 week  . Drug use: No    Types: Marijuana  . Sexual activity: Not on file  Other Topics Concern  . Not on file  Social History Narrative  . Not on file   Social Determinants of Health   Financial Resource Strain: Not on file  Food Insecurity: Not on file  Transportation Needs: Not on file  Physical Activity: Not on file  Stress: Not on file  Social Connections: Not on file  Intimate Partner Violence: Not on file      Family History  Problem Relation Age of Onset  . Arrhythmia Maternal Grandfather        Atrial fibrillation  . CAD Neg Hx     Vitals:   01/26/21 0942  BP: (!) 162/82  Pulse: (!) 106  SpO2: 95%  Weight: 100.7 kg (222 lb)   Wt Readings from Last 3 Encounters:  01/26/21 100.7 kg (222 lb)  11/29/20 101.7 kg (224 lb 3.2 oz)  11/20/20 102.4 kg (225 lb 12.8 oz)    PHYSICAL EXAM: General:  Well appearing. No resp difficulty HEENT: normal Neck: supple. no JVD. Carotids 2+ bilat; no bruits. No lymphadenopathy or thryomegaly appreciated. Cor: PMI nondisplaced. Regular tachy soft AI Lungs: clear Abdomen: obese soft, nontender, nondistended. No hepatosplenomegaly. No bruits or  masses. Good bowel sounds. Extremities: no cyanosis, clubbing, rash, edema Neuro: alert & orientedx3, cranial nerves grossly intact. moves all 4 extremities w/o difficulty. Affect pleasant  ECG: Sinus tach 101  LVH Personally reviewed  ASSESSMENT & PLAN:  1. Chronic systolic heart failure - due to NICM - suspect due to HTN and OSA +/- ETOH - cath 03/2013 no CAD - Echo 11/20 EF 30-35% - Echo 4/21 EF 20-25% moderate RV dysfunction (worse since previous) - Personally reviewed - TEE on 04/18/20. Procedure c/b by severe respiratory distress and laryngospasm. LVEF 25% RV moderately HK. Aortic valve not interrogated completely due to respiratory distress. AI appeared to be moderate. Severe TR - cMRI 8/21 EF 24% Moderate AI. No LGE  or infiltrative process,  - Symptomatically NYHA II - BP high here in setting of not taking his meds yet this am (wored last night) - Volume status looks good. Continue torsemide 40 bid - Continue Entresto 97/103 bid - Increase carvedilol to 50 bid - Continue hydralazine 100 tid Continue imdur - Continue Farxiga 10.  - Continue digoxin 0.125   - Suspect CM related to HTN +/- ETOH. AI may be contributing but severity of biventricular dysfunction out of proportion to AI - We discussed possible need for advanced therapies and need to be off ETOH and tobacco - He is now off ETOH. Will repeat echo in 1 month. If EF still <=35% consider ICD - Stressed need for BP control and asked him to keep BP log for me with daily BP readings for 1 month.  - Consider CPX - Labs today  2. Moderate aortic regurgitation - Visually mild to moderate on TEE 4/21 but P1/2 time suggesting severe AI.  - TEE on 04/18/20. Procedure c/b by severe respiratory distress and laryngospasm. LVEF 25% RV moderately HK. Aortic valve not interrogated completely due to respiratory distress. AI appeared to be moderate - TEE reviewed with echo team felt to be moderate at worst - Repeat echo 1 month    3. Hypertension - BP remains high despite multi-drug regimen - increasing carvedilol - Stressed need for med compliance and asked him to keep BP log for me  4. OSA on CPAP - Continue CPAP - Follows with Dr. Mayford Knife.  5. Obesity. - needs weight loss  6. Tobacco use/ETOH - Discussed need for cessation     Arvilla Meres, MD  10:12 AM

## 2021-03-19 ENCOUNTER — Other Ambulatory Visit: Payer: Self-pay | Admitting: Adult Health

## 2021-03-19 ENCOUNTER — Other Ambulatory Visit (HOSPITAL_COMMUNITY): Payer: Self-pay | Admitting: *Deleted

## 2021-03-19 MED ORDER — TORSEMIDE 20 MG PO TABS: 20.0000 mg | ORAL_TABLET | Freq: Every day | ORAL | 3 refills | Status: DC

## 2021-03-21 NOTE — Progress Notes (Signed)
ADVANCED HF CLINIC NOTE  Referring Physician: Primary Care: Primary Cardiologist: Clifton James  HPI:  Colin Walton) is a 50 y.o. male with morbid obesity, hypertension, chronic systolic heart failure, due to nonischemic cardiomyopathy, moderate aortic insufficiency, OSA on CPAP, and alcohol use. Referred by Dr. Clifton James to establish care in the HF Clinic  He was first diagnosed with CHF in 2013 during a hospitalization. Echo revealed EF of 40-45%. Follow up sleep study revealed severe OSA prompting CPAP therapy (followed by Dr. Mayford Knife). He was also noted to have uncontrolled hypertension. Right and left heart cath 03/2013 with no obstructive CAD and an EF of 55%. Unfortunately, repeat echo 11/2015 revealed and EF of 35-40% and diffuse hypokinesis, trivial AI, and moderately dilated LA.   Echo on  11/22/19 revealed an EF of 30-35% with LVH, mildly dilated left atrium, moderate aortic valve regurgitation, and elevated pulmonary pressure. Echo was reviewed and TEE was recommended to better evaluate the aortic valve.   Underwent TEE on 04/18/20. Procedure c/b by severe respiratory distress and laryngospasm. LVEF 25% RV moderately HK. Aortic valve not interrogated completely due to respiratory distress. AI appeared to be moderate   Brother died in 05/16/2023 from alcolohlism.   Echo 04/13/20 EF 20-25% Moderate RV dysfunction (worse from previous) Mild AI - reviewed personally   cMRI 8/21 1.  Severely dilated LV with diffuse hypokinesis, EF 24%. 2.  Mildly dilated RV with moderate systolic dysfunction, EF 32%. 3. Moderate aortic insufficiency with aortic valve regurgitant fraction 42%. 4.  Difficult images, but no definite LGE. 5. Mildly elevated extracellular volume percentage, not in range for amyloidosis or active myocarditis.  Here for routine f/u. At last visit increased carvedilol to 50 bid. Still working FT at [employer]. Says he feels pretty good. Has been  checking BP occasionally. SBP 140-160s at home. Has not been regular with his medicines for most of the week due to work. No CP. Only mild DOE. Mild LE edema after work. No ETOH. Smoking about 10 cigs per day.    Past Medical History:  Diagnosis Date  . Chronic systolic CHF (congestive heart failure) (HCC)   . Hyperlipidemia   . Hypertension   . Mild mitral regurgitation 2016  . NICM (nonischemic cardiomyopathy) (HCC)   . Obesity   . Sleep apnea 12/24/12   Sleep study February 2014    Current Outpatient Medications  Medication Sig Dispense Refill  . amLODipine (NORVASC) 10 MG tablet Take 1 tablet (10 mg total) by mouth daily. 30 tablet 3  . atorvastatin (LIPITOR) 10 MG tablet Take 1 tablet (10 mg total) by mouth daily. 30 tablet 3  . carvedilol (COREG) 25 MG tablet Take 2 tablets (50 mg total) by mouth 2 (two) times daily. 120 tablet 3  . colchicine 0.6 MG tablet Take 2 tablets at first sign of gout flare and then 1 tablet an hour later.  Repeat every 3 days until gout flare subsides. 12 tablet 1  . dapagliflozin propanediol (FARXIGA) 10 MG TABS tablet Take 1 tablet (10 mg total) by mouth daily before breakfast. 30 tablet 11  . digoxin (LANOXIN) 0.125 MG tablet Take 1 tablet (0.125 mg total) by mouth daily. 30 tablet 3  . hydrALAZINE (APRESOLINE) 100 MG tablet Take 1 tablet (100 mg total) by mouth 3 (three) times daily. 90 tablet 6  . isosorbide mononitrate (IMDUR) 60 MG 24 hr tablet Take 1 tablet (60 mg total) by mouth daily. 30 tablet 11  .  loratadine (CLARITIN) 10 MG tablet Take 10 mg by mouth daily as needed for allergies.    . Potassium Chloride ER 20 MEQ TBCR Take 20 mEq by mouth in the morning and at bedtime.     . sacubitril-valsartan (ENTRESTO) 97-103 MG Take 1 tablet by mouth 2 (two) times daily. 60 tablet 11  . spironolactone (ALDACTONE) 25 MG tablet Take 1 tablet (25 mg total) by mouth daily. 30 tablet 11  . torsemide (DEMADEX) 20 MG tablet TAKE 4 TABLETS BY MOUTH IN THE  MORNING AND TAKE 2 TABLETS BY MOUTH IN THE EVENING. 180 tablet 1  . Vitamin D, Ergocalciferol, (DRISDOL) 1.25 MG (50000 UNIT) CAPS capsule Take 50,000 Units by mouth once a week.     No current facility-administered medications for this encounter.   Facility-Administered Medications Ordered in Other Encounters  Medication Dose Route Frequency Provider Last Rate Last Admin  . perflutren lipid microspheres (DEFINITY) IV suspension  1-10 mL Intravenous PRN Kathleene Hazel, MD   2 mL at 03/22/21 1013    No Known Allergies    Social History   Socioeconomic History  . Marital status: Single    Spouse name: Not on file  . Number of children: 3  . Years of education: Not on file  . Highest education level: Not on file  Occupational History  . Occupation: Works with Copywriter, advertising: [employer]  Tobacco Use  . Smoking status: Light Tobacco Smoker    Packs/day: 0.05    Years: 22.00    Pack years: 1.10    Types: Cigarettes  . Smokeless tobacco: Never Used  Vaping Use  . Vaping Use: Never used  Substance and Sexual Activity  . Alcohol use: No    Alcohol/week: 30.0 standard drinks    Types: 30 Standard drinks or equivalent per week    Comment: x 1 week  . Drug use: No    Types: Marijuana  . Sexual activity: Not on file  Other Topics Concern  . Not on file  Social History Narrative  . Not on file   Social Determinants of Health   Financial Resource Strain: Not on file  Food Insecurity: Not on file  Transportation Needs: Not on file  Physical Activity: Not on file  Stress: Not on file  Social Connections: Not on file  Intimate Partner Violence: Not on file      Family History  Problem Relation Age of Onset  . Arrhythmia Maternal Grandfather        Atrial fibrillation  . CAD Neg Hx     Vitals:   03/22/21 1018  BP: (!) 180/88  Pulse: (!) 105  SpO2: 100%  Weight: 103.1 kg (227 lb 6.4 oz)   Wt Readings from Last 3 Encounters:  03/22/21 103.1 kg (227  lb 6.4 oz)  01/26/21 100.7 kg (222 lb)  11/29/20 101.7 kg (224 lb 3.2 oz)    PHYSICAL EXAM: General:  Well appearing. No resp difficulty HEENT: normal Neck: supple. no JVD. Carotids 2+ bilat; no bruits. No lymphadenopathy or thryomegaly appreciated. Cor: PMI nondisplaced. Regular tachy soft AI.  +s4 Lungs: clear Abdomen: soft, nontender, nondistended. No hepatosplenomegaly. No bruits or masses. Good bowel sounds. Extremities: no cyanosis, clubbing, rash, edema Neuro: alert & orientedx3, cranial nerves grossly intact. moves all 4 extremities w/o difficulty. Affect pleasant  ECG: Sinus 97  LVH Personally reviewed  ASSESSMENT & PLAN:  1. Chronic systolic heart failure - due to NICM - suspect due to HTN  and OSA +/- ETOH - cath 03/2013 no CAD - Echo 11/20 EF 30-35% - Echo 4/21 EF 20-25% moderate RV dysfunction (worse since previous) - Personally reviewed - TEE on 04/18/20. Procedure c/b by severe respiratory distress and laryngospasm. LVEF 25% RV moderately HK. Aortic valve not interrogated completely due to respiratory distress. AI appeared to be moderate.  - Echo today 03/22/21 EF 25-30% mod AI Moderate RV dysfunction  - cMRI 8/21 EF 24% Moderate AI. No LGE or infiltrative process,  - Symptomatically remains NYHA II - BP high here in setting of not taking his meds this week - Volume status looks good. Continue torsemide 40 bid - Continue Entresto 97/103 bid - Continue carvedilol to 50 bid - Continue hydralazine 100 tid Continue imdur - Continue Farxiga 10.  - Continue digoxin 0.125   - Suspect CM related to HTN +/- ETOH. AI may be contributing but severity of biventricular dysfunction out of proportion to AI and LV not overly dialted - We discussed possible need for advanced therapies and need to be off ETOH and tobacco - He is now off ETOH. - Again stressed need for BP control and asked him to keep BP log for me with daily BP readings for 1 month.  - Needs full med compliance before  getting ICD  - Labs tday  2. Moderate aortic regurgitation - Visually mild to moderate on TEE 4/21 but P1/2 time suggesting severe AI.  - TEE on 04/18/20. Procedure c/b by severe respiratory distress and laryngospasm. LVEF 25% RV moderately HK. Aortic valve not interrogated completely due to respiratory distress. AI appeared to be moderate - TEE reviewed with echo team felt to be moderate at worst - AI seems moderate on echo today  3. Hypertension - BP remains high despite multi-drug regimen - Plan as above  4. OSA on CPAP - He is compliant with CPAP - Follows with Dr. Mayford Knife.  5. Obesity. - needs weight loss  6. Tobacco use/ETOH - now off ETOH quit 1/22 - still smoking about 1/2 ppd. Urged cessation    Arvilla Meres, MD  10:50 AM

## 2021-03-22 ENCOUNTER — Encounter (HOSPITAL_COMMUNITY): Payer: Self-pay | Admitting: Internal Medicine

## 2021-03-22 ENCOUNTER — Ambulatory Visit (HOSPITAL_BASED_OUTPATIENT_CLINIC_OR_DEPARTMENT_OTHER)
Admission: RE | Admit: 2021-03-22 | Discharge: 2021-03-22 | Disposition: A | Discharge status: Home / Self Care | Payer: Managed Care, Other (non HMO) | Source: Ambulatory Visit | Attending: Internal Medicine | Admitting: Internal Medicine

## 2021-03-22 ENCOUNTER — Other Ambulatory Visit: Payer: Self-pay

## 2021-03-22 ENCOUNTER — Ambulatory Visit (HOSPITAL_COMMUNITY)
Admission: RE | Admit: 2021-03-22 | Discharge: 2021-03-22 | Disposition: A | Discharge status: Home / Self Care | Payer: Managed Care, Other (non HMO) | Source: Ambulatory Visit | Attending: Nurse Practitioner | Admitting: Nurse Practitioner

## 2021-03-22 VITALS — BP 180/88 | HR 105 | Wt 227.4 lb

## 2021-03-22 DIAGNOSIS — R0609 Other forms of dyspnea: Secondary | ICD-10-CM | POA: Insufficient documentation

## 2021-03-22 DIAGNOSIS — G4733 Obstructive sleep apnea (adult) (pediatric): Secondary | ICD-10-CM

## 2021-03-22 DIAGNOSIS — Z7984 Long term (current) use of oral hypoglycemic drugs: Secondary | ICD-10-CM | POA: Diagnosis not present

## 2021-03-22 DIAGNOSIS — F1010 Alcohol abuse, uncomplicated: Secondary | ICD-10-CM

## 2021-03-22 DIAGNOSIS — Z79899 Other long term (current) drug therapy: Secondary | ICD-10-CM | POA: Insufficient documentation

## 2021-03-22 DIAGNOSIS — Z7901 Long term (current) use of anticoagulants: Secondary | ICD-10-CM | POA: Diagnosis not present

## 2021-03-22 DIAGNOSIS — I110 Hypertensive heart disease with heart failure: Secondary | ICD-10-CM | POA: Diagnosis not present

## 2021-03-22 DIAGNOSIS — I428 Other cardiomyopathies: Secondary | ICD-10-CM | POA: Insufficient documentation

## 2021-03-22 DIAGNOSIS — I5022 Chronic systolic (congestive) heart failure: Secondary | ICD-10-CM | POA: Diagnosis not present

## 2021-03-22 DIAGNOSIS — F17210 Nicotine dependence, cigarettes, uncomplicated: Secondary | ICD-10-CM | POA: Diagnosis not present

## 2021-03-22 DIAGNOSIS — I351 Nonrheumatic aortic (valve) insufficiency: Secondary | ICD-10-CM

## 2021-03-22 DIAGNOSIS — I509 Heart failure, unspecified: Secondary | ICD-10-CM

## 2021-03-22 DIAGNOSIS — I10 Essential (primary) hypertension: Secondary | ICD-10-CM

## 2021-03-22 DIAGNOSIS — Z720 Tobacco use: Secondary | ICD-10-CM | POA: Diagnosis not present

## 2021-03-22 DIAGNOSIS — I083 Combined rheumatic disorders of mitral, aortic and tricuspid valves: Secondary | ICD-10-CM | POA: Diagnosis not present

## 2021-03-22 DIAGNOSIS — E669 Obesity, unspecified: Secondary | ICD-10-CM

## 2021-03-22 LAB — ECHOCARDIOGRAM COMPLETE
AR max vel: 1.45 cm2
AV Area VTI: 1.61 cm2
AV Area mean vel: 1.35 cm2
AV Mean grad: 7 mmHg
AV Peak grad: 13.1 mmHg
Ao pk vel: 1.81 m/s
Calc EF: 18.8 %
P 1/2 time: 217 msec
S' Lateral: 5.7 cm
Single Plane A2C EF: 15.7 %
Single Plane A4C EF: 25.9 %

## 2021-03-22 LAB — BASIC METABOLIC PANEL
Anion gap: 10 (ref 5–15)
BUN: 14 mg/dL (ref 6–20)
CO2: 32 mmol/L (ref 22–32)
Calcium: 9 mg/dL (ref 8.9–10.3)
Chloride: 98 mmol/L (ref 98–111)
Creatinine, Ser: 1.1 mg/dL (ref 0.61–1.24)
GFR, Estimated: >60 mL/min (ref >60)
Glucose, Bld: 109 mg/dL — ABNORMAL HIGH (ref 70–99)
Potassium: 3.3 mmol/L — ABNORMAL LOW (ref 3.5–5.1)
Sodium: 140 mmol/L (ref 135–145)

## 2021-03-22 LAB — BRAIN NATRIURETIC PEPTIDE: B Natriuretic Peptide: 995.8 pg/mL — ABNORMAL HIGH (ref 0.0–100.0)

## 2021-03-22 MED ORDER — PERFLUTREN LIPID MICROSPHERE
1.0000 mL | INTRAVENOUS | Status: DC | PRN
  Administered 2021-03-22: 2 mL via INTRAVENOUS
  Filled 2021-03-22: qty 10

## 2021-03-22 NOTE — Progress Notes (Addendum)
  Echocardiogram 2D Echocardiogram with contrast has been performed.  Colin Walton F 03/22/2021, 10:10 AM

## 2021-03-22 NOTE — Patient Instructions (Addendum)
Continue current medications as prescribed  Your physician has requested that you regularly monitor and record your blood pressure readings at home. Please use the same machine at the same time of day to check your readings and record them to bring to your follow-up visit.  Labs done today, your results will be available in MyChart, we will contact you for abnormal readings.  Your physician recommends that you schedule a follow-up appointment in: 2-3 months  If you have any questions or concerns before your next appointment please send [place] a message through [place] or call our office at [phone].    TO LEAVE A MESSAGE FOR THE NURSE SELECT OPTION 2, PLEASE LEAVE A MESSAGE INCLUDING: . YOUR NAME . DATE OF BIRTH . CALL BACK NUMBER . REASON FOR CALL**this is important as we prioritize the call backs  YOU WILL RECEIVE A CALL BACK THE SAME DAY AS LONG AS YOU CALL BEFORE 4:00 PM  At the [facility], you and your health needs are our priority. As part of our continuing mission to provide you with exceptional heart care, we have created designated Provider Care Teams. These Care Teams include your primary Cardiologist (physician) and Advanced Practice Providers (APPs- Physician Assistants and Nurse Practitioners) who all work together to provide you with the care you need, when you need it.   You may see any of the following providers on your designated Care Team at your next follow up: Marland Kitchen Dr Arvilla Meres . Dr Marca Ancona . Dr Thornell Mule . Tonye Becket, NP . Robbie Lis, PA . Shanda Bumps [place],NP . Karle Plumber, PharmD   Please be sure to bring in all your medications bottles to every appointment.

## 2021-03-22 NOTE — Addendum Note (Signed)
Encounter addended by: Dolores Patty, MD on: 03/22/2021 11:13 AM  Actions taken: Level of Service modified, Visit diagnoses modified

## 2021-05-09 ENCOUNTER — Encounter: Payer: Self-pay | Admitting: *Deleted

## 2021-06-06 ENCOUNTER — Encounter (HOSPITAL_COMMUNITY): Payer: Managed Care, Other (non HMO) | Admitting: Internal Medicine

## 2021-07-18 ENCOUNTER — Other Ambulatory Visit (HOSPITAL_COMMUNITY): Payer: Self-pay | Admitting: *Deleted

## 2021-08-24 ENCOUNTER — Encounter (HOSPITAL_COMMUNITY): Payer: Managed Care, Other (non HMO) | Admitting: Internal Medicine

## 2021-09-20 ENCOUNTER — Other Ambulatory Visit: Payer: Self-pay | Admitting: Orthopedic Surgery

## 2021-09-20 DIAGNOSIS — M25562 Pain in left knee: Secondary | ICD-10-CM

## 2021-10-04 ENCOUNTER — Ambulatory Visit (HOSPITAL_COMMUNITY)
Admission: RE | Admit: 2021-10-04 | Discharge: 2021-10-04 | Disposition: A | Discharge status: Home / Self Care | Payer: Managed Care, Other (non HMO) | Source: Ambulatory Visit | Attending: Internal Medicine | Admitting: Internal Medicine

## 2021-10-04 ENCOUNTER — Encounter (HOSPITAL_COMMUNITY): Payer: Self-pay | Admitting: Internal Medicine

## 2021-10-04 ENCOUNTER — Other Ambulatory Visit: Payer: Self-pay

## 2021-10-04 VITALS — BP 110/60 | HR 72 | Wt 251.8 lb

## 2021-10-04 DIAGNOSIS — I5082 Biventricular heart failure: Secondary | ICD-10-CM | POA: Insufficient documentation

## 2021-10-04 DIAGNOSIS — Z79899 Other long term (current) drug therapy: Secondary | ICD-10-CM | POA: Diagnosis not present

## 2021-10-04 DIAGNOSIS — Z7984 Long term (current) use of oral hypoglycemic drugs: Secondary | ICD-10-CM | POA: Diagnosis not present

## 2021-10-04 DIAGNOSIS — I10 Essential (primary) hypertension: Secondary | ICD-10-CM

## 2021-10-04 DIAGNOSIS — E785 Hyperlipidemia, unspecified: Secondary | ICD-10-CM | POA: Insufficient documentation

## 2021-10-04 DIAGNOSIS — I428 Other cardiomyopathies: Secondary | ICD-10-CM | POA: Diagnosis not present

## 2021-10-04 DIAGNOSIS — Z6839 Body mass index (BMI) 39.0-39.9, adult: Secondary | ICD-10-CM | POA: Diagnosis not present

## 2021-10-04 DIAGNOSIS — Z8249 Family history of ischemic heart disease and other diseases of the circulatory system: Secondary | ICD-10-CM | POA: Insufficient documentation

## 2021-10-04 DIAGNOSIS — I5040 Unspecified combined systolic (congestive) and diastolic (congestive) heart failure: Secondary | ICD-10-CM | POA: Diagnosis not present

## 2021-10-04 DIAGNOSIS — I110 Hypertensive heart disease with heart failure: Secondary | ICD-10-CM | POA: Diagnosis not present

## 2021-10-04 DIAGNOSIS — G4733 Obstructive sleep apnea (adult) (pediatric): Secondary | ICD-10-CM | POA: Insufficient documentation

## 2021-10-04 DIAGNOSIS — F17210 Nicotine dependence, cigarettes, uncomplicated: Secondary | ICD-10-CM | POA: Insufficient documentation

## 2021-10-04 DIAGNOSIS — I5022 Chronic systolic (congestive) heart failure: Secondary | ICD-10-CM

## 2021-10-04 DIAGNOSIS — I351 Nonrheumatic aortic (valve) insufficiency: Secondary | ICD-10-CM | POA: Insufficient documentation

## 2021-10-04 LAB — COMPREHENSIVE METABOLIC PANEL
ALT: 14 U/L (ref 0–44)
AST: 29 U/L (ref 15–41)
Albumin: 3.1 g/dL — ABNORMAL LOW (ref 3.5–5.0)
Alkaline Phosphatase: 130 U/L — ABNORMAL HIGH (ref 38–126)
Anion gap: 12 (ref 5–15)
BUN: 14 mg/dL (ref 6–20)
CO2: 28 mmol/L (ref 22–32)
Calcium: 8.7 mg/dL — ABNORMAL LOW (ref 8.9–10.3)
Chloride: 97 mmol/L — ABNORMAL LOW (ref 98–111)
Creatinine, Ser: 1.67 mg/dL — ABNORMAL HIGH (ref 0.61–1.24)
GFR, Estimated: 50 mL/min — ABNORMAL LOW (ref >60)
Glucose, Bld: 122 mg/dL — ABNORMAL HIGH (ref 70–99)
Potassium: 3.4 mmol/L — ABNORMAL LOW (ref 3.5–5.1)
Sodium: 137 mmol/L (ref 135–145)
Total Bilirubin: 4.5 mg/dL — ABNORMAL HIGH (ref 0.3–1.2)
Total Protein: 6.8 g/dL (ref 6.5–8.1)

## 2021-10-04 MED ORDER — METOLAZONE 2.5 MG PO TABS
2.5000 mg | ORAL_TABLET | ORAL | 0 refills | Status: DC
Start: 2021-10-04 — End: 2021-10-08

## 2021-10-04 MED ORDER — POTASSIUM CHLORIDE CRYS ER 20 MEQ PO TBCR
40.0000 meq | EXTENDED_RELEASE_TABLET | ORAL | 3 refills | Status: DC
Start: 2021-10-04 — End: 2021-10-08

## 2021-10-04 MED ORDER — TORSEMIDE 20 MG PO TABS: 80.0000 mg | ORAL_TABLET | Freq: Two times a day (BID) | ORAL | 3 refills | Status: AC

## 2021-10-04 NOTE — Progress Notes (Signed)
ADVANCED HF CLINIC NOTE   Primary Care: Shanna Cisco, NP Primary Cardiologist: Clifton James AHF: Dr. Gala Romney   Reason for Visit: F/u for Chronic Systolic Heart Failure   HPI:  Colin Walton) is a 50 y.o. male with morbid obesity, hypertension, chronic systolic heart failure, due to nonischemic cardiomyopathy, moderate aortic insufficiency, OSA on CPAP, and alcohol use. Referred by Dr. Clifton James to establish care in the HF Clinic  He was first diagnosed with CHF in 2013 during a hospitalization. Echo revealed EF of 40-45%. Follow up sleep study revealed severe OSA prompting CPAP therapy (followed by Dr. Mayford Knife). He was also noted to have uncontrolled hypertension. Right and left heart cath 03/2013 with no obstructive CAD and an EF of 55%. Unfortunately, repeat echo 11/2015 revealed and EF of 35-40% and diffuse hypokinesis, trivial AI, and moderately dilated LA.   Echo on  11/22/19 revealed an EF of 30-35% with LVH, mildly dilated left atrium, moderate aortic valve regurgitation, and elevated pulmonary pressure. Echo was reviewed and TEE was recommended to better evaluate the aortic valve.   Underwent TEE on 04/18/20. Procedure c/b by severe respiratory distress and laryngospasm. LVEF 25% RV moderately HK. Aortic valve not interrogated completely due to respiratory distress. AI appeared to be moderate  Brother died in 30-May-2023 from alcolohlism.   Echo 04/13/20 EF 20-25% Moderate RV dysfunction (worse from previous) Mild AI - reviewed personally   cMRI 8/21 1.  Severely dilated LV with diffuse hypokinesis, EF 24%. 2.  Mildly dilated RV with moderate systolic dysfunction, EF 32%. 3. Moderate aortic insufficiency with aortic valve regurgitant fraction 42%. 4.  Difficult images, but no definite LGE. 5. Mildly elevated extracellular volume percentage, not in range for amyloidosis or active myocarditis.  Echo 3/22 EF 25-30%, RV moderately reduced.   He presents to clinic today for  routien f/u. He is fluid overloaded w/ R>L sided HF symptoms. Wt up 24 lb but reports stable NYHA Class II symptoms. No othopnea/ PND. + LEE. No CP. Ran out of torsemide 3 days ago but taking all other meds. BP well controlled. Reports full compliance w/ CPAP. States he was recently diagnosed w/ "Fatty Liver". W/u done at [facility], no records available for review. He has cut down on ETOH use but still drinks wine occasionally. He had quit smoking but unfortunately has restarted.   Review of systems complete and found to be negative unless listed in HPI.     Past Medical History:  Diagnosis Date   Chronic systolic CHF (congestive heart failure) (HCC)    Hyperlipidemia    Hypertension    Mild mitral regurgitation 2016   NICM (nonischemic cardiomyopathy) (HCC)    Obesity    Sleep apnea 12/24/12   Sleep study February 2014    Current Outpatient Medications  Medication Sig Dispense Refill   amLODipine (NORVASC) 10 MG tablet Take 1 tablet (10 mg total) by mouth daily. 30 tablet 3   atorvastatin (LIPITOR) 10 MG tablet Take 1 tablet (10 mg total) by mouth daily. 30 tablet 3   carvedilol (COREG) 25 MG tablet Take 2 tablets (50 mg total) by mouth 2 (two) times daily. 120 tablet 3   colchicine 0.6 MG tablet Take 2 tablets at first sign of gout flare and then 1 tablet an hour later.  Repeat every 3 days until gout flare subsides. 12 tablet 1   dapagliflozin propanediol (FARXIGA) 10 MG TABS tablet Take 1 tablet (10 mg total) by mouth daily before breakfast. 30  tablet 11   digoxin (LANOXIN) 0.125 MG tablet Take 1 tablet (0.125 mg total) by mouth daily. 30 tablet 3   hydrALAZINE (APRESOLINE) 100 MG tablet Take 1 tablet (100 mg total) by mouth 3 (three) times daily. 90 tablet 6   isosorbide mononitrate (IMDUR) 60 MG 24 hr tablet Take 1 tablet (60 mg total) by mouth daily. 30 tablet 11   loratadine (CLARITIN) 10 MG tablet Take 10 mg by mouth daily as needed for allergies.     sacubitril-valsartan  (ENTRESTO) 97-103 MG Take 1 tablet by mouth 2 (two) times daily. 60 tablet 11   spironolactone (ALDACTONE) 25 MG tablet Take 1 tablet (25 mg total) by mouth daily. 30 tablet 11   torsemide (DEMADEX) 20 MG tablet TAKE 4 TABLETS BY MOUTH IN THE MORNING AND TAKE 2 TABLETS BY MOUTH IN THE EVENING. 180 tablet 1   Vitamin D, Ergocalciferol, (DRISDOL) 1.25 MG (50000 UNIT) CAPS capsule Take 50,000 Units by mouth once a week.     No current facility-administered medications for this encounter.    No Known Allergies    Social History   Socioeconomic History   Marital status: Single    Spouse name: Not on file   Number of children: 3   Years of education: Not on file   Highest education level: Not on file  Occupational History   Occupation: Works with Copywriter, advertising: [employer]  Tobacco Use   Smoking status: Light Smoker    Packs/day: 0.05    Years: 22.00    Pack years: 1.10    Types: Cigarettes   Smokeless tobacco: Never  Vaping Use   Vaping Use: Never used  Substance and Sexual Activity   Alcohol use: No    Alcohol/week: 30.0 standard drinks    Types: 30 Standard drinks or equivalent per week    Comment: x 1 week   Drug use: No    Types: Marijuana   Sexual activity: Not on file  Other Topics Concern   Not on file  Social History Narrative   Not on file   Social Determinants of Health   Financial Resource Strain: Not on file  Food Insecurity: Not on file  Transportation Needs: Not on file  Physical Activity: Not on file  Stress: Not on file  Social Connections: Not on file  Intimate Partner Violence: Not on file      Family History  Problem Relation Age of Onset   Arrhythmia Maternal Grandfather        Atrial fibrillation   CAD Neg Hx     Vitals:   10/04/21 1424  BP: 110/60  Pulse: 72  SpO2: 94%  Weight: 114.2 kg (251 lb 12.8 oz)   Wt Readings from Last 3 Encounters:  10/04/21 114.2 kg (251 lb 12.8 oz)  03/22/21 103.1 kg (227 lb 6.4 oz)  01/26/21  100.7 kg (222 lb)   PHYSICAL EXAM: General:  Well appearing. No respiratory difficulty HEENT: normal Neck: supple. JVD elevated to jaw. Carotids 2+ bilat; no bruits. No lymphadenopathy or thyromegaly appreciated. Cor: PMI nondisplaced. Regular rate & rhythm. No rubs, gallops or murmurs. Lungs: clear Abdomen: edematous, mildly distended, nontender, + hepatosplenomegaly. No bruits or masses. Good bowel sounds. Extremities: no cyanosis, clubbing, rash, 2-3+ bilateral LE edema Neuro: alert & oriented x 3, cranial nerves grossly intact. moves all 4 extremities w/o difficulty. Affect pleasant.   ECG: Not performed   ASSESSMENT & PLAN:  1. Acute on Chronic Biventricular Heart Failure  -  due to NICM - suspect due to HTN and OSA +/- ETOH - cath 03/2013 no CAD - Echo 11/20 EF 30-35% - Echo 4/21 EF 20-25% moderate RV dysfunction (worse since previous) - Personally reviewed - TEE on 04/18/20. Procedure c/b by severe respiratory distress and laryngospasm. LVEF 25% RV moderately HK. Aortic valve not interrogated completely due to respiratory distress. AI appeared to be moderate.  - Echo 03/22/21 EF 25-30% mod AI Moderate RV dysfunction  - cMRI 8/21 EF 24% Moderate AI. No LGE or infiltrative process - Symptomatically remains NYHA II but markedly fluid overloaded on exam, Wt up 24 lb. Ran out of torsemide 3 days ago. BP well controlled - Restart Torsemide 80 mg bid - Add metolazone 2.5 mg daily x 2 days w/ extra 40 MEq of KCl  - Order TED Hoses for edema - Order paramedicine to help w/ compliance  - Continue Entresto 97/103 bid - Continue carvedilol to 50 bid - Continue hydralazine 100 tid Continue imdur - Continue Farxiga 10 - Continue digoxin 0.125. Check Dig level   - Suspect CM related to HTN +/- ETOH. AI may be contributing but severity of biventricular dysfunction out of proportion to AI and LV not overly dialted - May need future advanced therapies but needs to be off ETOH and tobacco - He  is now off ETOH but still smoking  - Needs full med compliance before getting ICD. Arrange paramedicine to help  - BMP today   2. Moderate aortic regurgitation - Visually mild to moderate on TEE 4/21 but P1/2 time suggesting severe AI.  - TEE on 04/18/20. Procedure c/b by severe respiratory distress and laryngospasm. LVEF 25% RV moderately HK. Aortic valve not interrogated completely due to respiratory distress. AI appeared to be moderate - TEE reviewed with echo team felt to be moderate at worst - AI moderate on echo 3/21  3. Hypertension - Controlled on current regimen  - Plan as above   4. OSA on CPAP - Fully compliant w/ CPAP  - Follows with Dr. Mayford Knife.   5. Obesity. -Body mass index is 39.44 kg/m. - needs weight loss  6. Tobacco use/ETOH - now off ETOH quit 1/22 - continues to smoke, encouraged to quit   7. ? Fatty Liver - reports recent diagnosis at [facility], records not available  - Check CMP    F/u w/ APP in 1 week to reassess volume status   Robbie Lis, PA-C  2:37 PM  Patient seen and examined with the above-signed Advanced Practice Provider and/or Housestaff. I personally reviewed laboratory data, imaging studies and relevant notes. I independently examined the patient and formulated the important aspects of the plan. I have edited the note to reflect any of my changes or salient points. I have personally discussed the plan with the patient and/or family.  He feels OK. NYHA II-III but is markedly volume overloaded. Missed several doses of torsemide.   General:  Well appearing. No resp difficulty HEENT: normal Neck: supple. JVP to ear Carotids 2+ bilat; no bruits. No lymphadenopathy or thryomegaly appreciated. Cor: PMI nondisplaced. Regular rate & rhythm. No rubs, gallops or murmurs. Lungs: clear Abdomen: obese soft, nontender, nondistended. No hepatosplenomegaly. No bruits or masses. Good bowel sounds. Extremities: no cyanosis, clubbing,  rash, 3+ edema Neuro: alert & orientedx3, cranial nerves grossly intact. moves all 4 extremities w/o difficulty. Affect pleasant  He is markedly volume overloaded. Will restart torsemide 80 bid and give metolazone for 2 days. Engage paramedicine. Will need  to see back next week to make sure he is responding. If no response will need IV lasix with [facility] or admission.   Total time spent 45 minutes. Over half that time spent discussing above.   Arvilla Meres, MD  4:40 PM

## 2021-10-04 NOTE — Progress Notes (Signed)
Heart and Vascular Care Navigation  10/04/2021  Colin Walton Nov 26, 1971 [id number]  Reason for Referral: Enrollment in [facility] with patient face to face for initial visit for Heart and Vascular Care Coordination.                                                                                                   Paramedicine Initial Assessment:  Housing:  In what kind of housing do you live? House/apt/trailer/shelter? apartment  Do you rent/pay a mortgage/own? rent  Do you live with anyone? Girlfirend and her adult dtr  Are you currently worried about losing your housing? no  Social:  What is your current marital status? single  Do you have any children? 3 adult children who live locally  Do you have family or friends who live locally? Mom and children all live locally- biggest support outside his girlfriend is his mom.   Income:  What is your current source of income? 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 Not very hard  Do you have outstanding medical bills? no  Insurance:  Are you currently insured? yes  Do you have prescription coverage? yes   Transportation:  Do you have transportation to your medical appointments? Yes has his own vehicle    Daily Health Needs: Do you have a working scale at home? yes  How do you manage your medications at home? Out of the pillbox  Do you ever take your medications differently than prescribed? Yes but unintentionally- states that his work schedule makes it hard to be compliant with medication regimen.  Do you have issues affording your medications? no  If yes, has this ever prevented you from obtaining medications? N/a  Do you have any concerns with mobility at home? No- independent  Do you use any assistive devices at home or have PCS at home? no  Do you have a PCP? Yes Meredith  Cullop  Do you have any trouble reading or writing? no  Are there any additional barriers you see to getting the care you need? no                                HRT/VAS Care Coordination     Home Assistive Devices/Equipment CPAP       Social History:                                                                             SDOH Screenings   Alcohol Screen: Not on file  Depression (UYQ0-3): Not on file  Financial Resource Strain: Low Risk    Difficulty of Paying Living Expenses: Not  very hard  Food Insecurity: No Food Insecurity   Worried About Programme researcher, broadcasting/film/video in the Last Year: Never true   Ran Out of Food in the Last Year: Never true  Housing: Low Risk    Last Housing Risk Score: 0  Physical Activity: Not on file  Social Connections: Not on file  Stress: Not on file  Tobacco Use: High Risk   Smoking Tobacco Use: Light Smoker   Smokeless Tobacco Use: Never  Transportation Needs: No Transportation Needs   Lack of Transportation (Medical): No   Lack of Transportation (Non-Medical): No    SDOH Interventions: Financial Resources:  Financial Strain Interventions: Intervention Not Indicated   Food Insecurity:  Food Insecurity Interventions: Intervention Not Indicated  Housing Insecurity:  Housing Interventions: Intervention Not Indicated  Transportation:   Transportation Interventions: Intervention Not Indicated    Follow-up plan:    Referral sent to [facility] for assignment  Will continue to follow and assist as needed  Burna Sis, LCSW Clinical Social Worker [facility] Desk#: [phone] Cell#: [phone]

## 2021-10-04 NOTE — Patient Instructions (Signed)
Restart Torsemide at 80 mg (4 tabs) Twice daily   Take Metolazone 2.5 mg FOR 2 DAYS ONLY  Take Potassium 40 meq (2 tabs) FOR 2 DAYS ONLY, when you take Metolazone  Labs done today, your results will be available in MyChart, we will contact you for abnormal readings.  You have been referred to our [employer], they will call you and schedule a time to come out to your home  Your physician recommends that you schedule a follow-up appointment in: 1 week  Do the following things EVERYDAY: Weigh yourself in the morning before breakfast. Write it down and keep it in a log. Take your medicines as prescribed Eat low salt foods--Limit salt (sodium) to 2000 mg per day.  Stay as active as you can everyday Limit all fluids for the day to less than 2 liters  If you have any questions or concerns before your next appointment please send [place] a message through [place] or call our office at [phone].    TO LEAVE A MESSAGE FOR THE NURSE SELECT OPTION 2, PLEASE LEAVE A MESSAGE INCLUDING: YOUR NAME DATE OF BIRTH CALL BACK NUMBER REASON FOR CALL**this is important as we prioritize the call backs  YOU WILL RECEIVE A CALL BACK THE SAME DAY AS LONG AS YOU CALL BEFORE 4:00 PM  At the [facility], you and your health needs are our priority. As part of our continuing mission to provide you with exceptional heart care, we have created designated Provider Care Teams. These Care Teams include your primary Cardiologist (physician) and Advanced Practice Providers (APPs- Physician Assistants and Nurse Practitioners) who all work together to provide you with the care you need, when you need it.   You may see any of the following providers on your designated Care Team at your next follow up: Dr Arvilla Meres Dr Marca Ancona Dr Brandon Melnick, NP Robbie Lis, [place] Mikki Santee Karle Plumber, PharmD   Please be sure to bring in all your medications  bottles to every appointment.

## 2021-10-08 ENCOUNTER — Other Ambulatory Visit: Payer: Self-pay

## 2021-10-08 ENCOUNTER — Other Ambulatory Visit (HOSPITAL_COMMUNITY): Payer: Self-pay | Admitting: Family Medicine

## 2021-10-08 ENCOUNTER — Encounter (HOSPITAL_COMMUNITY): Payer: Self-pay

## 2021-10-08 ENCOUNTER — Ambulatory Visit (HOSPITAL_COMMUNITY)
Admission: RE | Admit: 2021-10-08 | Discharge: 2021-10-08 | Disposition: A | Discharge status: Home / Self Care | Payer: Managed Care, Other (non HMO) | Source: Ambulatory Visit | Attending: Family Medicine | Admitting: Family Medicine

## 2021-10-08 ENCOUNTER — Other Ambulatory Visit (HOSPITAL_COMMUNITY): Payer: Self-pay

## 2021-10-08 VITALS — BP 160/78 | HR 94 | Wt 228.8 lb

## 2021-10-08 DIAGNOSIS — J385 Laryngeal spasm: Secondary | ICD-10-CM | POA: Diagnosis not present

## 2021-10-08 DIAGNOSIS — I110 Hypertensive heart disease with heart failure: Secondary | ICD-10-CM | POA: Diagnosis present

## 2021-10-08 DIAGNOSIS — F17210 Nicotine dependence, cigarettes, uncomplicated: Secondary | ICD-10-CM | POA: Insufficient documentation

## 2021-10-08 DIAGNOSIS — E669 Obesity, unspecified: Secondary | ICD-10-CM

## 2021-10-08 DIAGNOSIS — F1011 Alcohol abuse, in remission: Secondary | ICD-10-CM

## 2021-10-08 DIAGNOSIS — I5022 Chronic systolic (congestive) heart failure: Secondary | ICD-10-CM | POA: Insufficient documentation

## 2021-10-08 DIAGNOSIS — I351 Nonrheumatic aortic (valve) insufficiency: Secondary | ICD-10-CM | POA: Diagnosis not present

## 2021-10-08 DIAGNOSIS — I5082 Biventricular heart failure: Secondary | ICD-10-CM | POA: Diagnosis not present

## 2021-10-08 DIAGNOSIS — Z8249 Family history of ischemic heart disease and other diseases of the circulatory system: Secondary | ICD-10-CM | POA: Diagnosis not present

## 2021-10-08 DIAGNOSIS — I428 Other cardiomyopathies: Secondary | ICD-10-CM | POA: Insufficient documentation

## 2021-10-08 DIAGNOSIS — I10 Essential (primary) hypertension: Secondary | ICD-10-CM | POA: Diagnosis not present

## 2021-10-08 DIAGNOSIS — G4733 Obstructive sleep apnea (adult) (pediatric): Secondary | ICD-10-CM | POA: Diagnosis not present

## 2021-10-08 DIAGNOSIS — K760 Fatty (change of) liver, not elsewhere classified: Secondary | ICD-10-CM | POA: Diagnosis not present

## 2021-10-08 DIAGNOSIS — Z7984 Long term (current) use of oral hypoglycemic drugs: Secondary | ICD-10-CM | POA: Insufficient documentation

## 2021-10-08 DIAGNOSIS — I5040 Unspecified combined systolic (congestive) and diastolic (congestive) heart failure: Secondary | ICD-10-CM

## 2021-10-08 DIAGNOSIS — Z6835 Body mass index (BMI) 35.0-35.9, adult: Secondary | ICD-10-CM | POA: Insufficient documentation

## 2021-10-08 DIAGNOSIS — Z720 Tobacco use: Secondary | ICD-10-CM

## 2021-10-08 LAB — DIGOXIN LEVEL: Digoxin Level: <0.2 ng/mL — ABNORMAL LOW (ref 0.8–2.0)

## 2021-10-08 LAB — BASIC METABOLIC PANEL
Anion gap: 15 (ref 5–15)
BUN: 19 mg/dL (ref 6–20)
CO2: 39 mmol/L — ABNORMAL HIGH (ref 22–32)
Calcium: 9.6 mg/dL (ref 8.9–10.3)
Chloride: 83 mmol/L — ABNORMAL LOW (ref 98–111)
Creatinine, Ser: 1.66 mg/dL — ABNORMAL HIGH (ref 0.61–1.24)
GFR, Estimated: 50 mL/min — ABNORMAL LOW (ref >60)
Glucose, Bld: 103 mg/dL — ABNORMAL HIGH (ref 70–99)
Potassium: 2.8 mmol/L — ABNORMAL LOW (ref 3.5–5.1)
Sodium: 137 mmol/L (ref 135–145)

## 2021-10-08 MED ORDER — SPIRONOLACTONE 25 MG PO TABS: 25.0000 mg | ORAL_TABLET | Freq: Every day | ORAL | 5 refills | Status: AC

## 2021-10-08 MED ORDER — POTASSIUM CHLORIDE CRYS ER 20 MEQ PO TBCR: 40.0000 meq | EXTENDED_RELEASE_TABLET | Freq: Two times a day (BID) | ORAL | 6 refills | Status: AC

## 2021-10-08 MED ORDER — METOLAZONE 2.5 MG PO TABS
2.5000 mg | ORAL_TABLET | ORAL | 0 refills | Status: AC
Start: 2021-10-08 — End: ?

## 2021-10-08 NOTE — Patient Instructions (Signed)
Labs were done today, if any labs are abnormal the clinic will call you  TAKE 2.5 mg of Metolazone with 40 meq of Potassium on Tuesday 10/09/2021  Your physician recommends that you return for lab work in: 10 days  Your physician recommends that you schedule a follow-up appointment in:  3 weeks and in 4 months  At the [facility], you and your health needs are our priority. As part of our continuing mission to provide you with exceptional heart care, we have created designated Provider Care Teams. These Care Teams include your primary Cardiologist (physician) and Advanced Practice Providers (APPs- Physician Assistants and Nurse Practitioners) who all work together to provide you with the care you need, when you need it.   You may see any of the following providers on your designated Care Team at your next follow up: Dr Arvilla Meres Dr Marca Ancona Dr Brandon Melnick, NP Robbie Lis, [place] Mikki Santee Karle Plumber, PharmD   Please be sure to bring in all your medications bottles to every appointment.    If you have any questions or concerns before your next appointment please send [place] a message through [place] or call our office at [phone].    TO LEAVE A MESSAGE FOR THE NURSE SELECT OPTION 2, PLEASE LEAVE A MESSAGE INCLUDING: YOUR NAME DATE OF BIRTH CALL BACK NUMBER REASON FOR CALL**this is important as we prioritize the call backs  YOU WILL RECEIVE A CALL BACK THE SAME DAY AS LONG AS YOU CALL BEFORE 4:00 PM

## 2021-10-08 NOTE — Progress Notes (Signed)
ADVANCED HF CLINIC NOTE   Primary Care: Shanna Cisco, NP Primary Cardiologist: Clifton James AHF: Dr. Gala Romney   Reason for Visit: F/u for Chronic Systolic Heart Failure   HPI: Colin Walton [name]) is a 50 y.o.male with morbid obesity, hypertension, chronic systolic heart failure, due to nonischemic cardiomyopathy, moderate aortic insufficiency, OSA on CPAP, and alcohol use.   He was first diagnosed with CHF in 2013 during a hospitalization. Echo revealed EF of 40-45%. Follow up sleep study revealed severe OSA prompting CPAP therapy (followed by Dr. Mayford Knife). He was also noted to have uncontrolled hypertension. Right and left heart cath 03/2013 with no obstructive CAD and an EF of 55%. Unfortunately, repeat echo 11/2015 revealed and EF of 35-40% and diffuse hypokinesis, trivial AI, and moderately dilated LA.   Echo 1120 revealed an EF of 30-35% with LVH, mildly dilated left atrium, moderate aortic valve regurgitation, and elevated pulmonary pressure. Echo was reviewed and TEE was recommended to better evaluate the aortic valve.   Underwent TEE on 04/18/20. Procedure c/b by severe respiratory distress and laryngospasm. LVEF 25% RV moderately HK. Aortic valve not interrogated completely due to respiratory distress. AI appeared to be moderate  Brother died in 08-Jun-2023 from alcolohlism.   Echo 04/13/20 EF 20-25% Moderate RV dysfunction (worse from previous) Mild AI - reviewed personally   cMRI 8/21 severely dilated LV with diffuse hypokinesis, EF 24%, mild RV dilation RV EF 32%, moderate AI AV regurgitant fraction 42%, no LGE (difficult images), mildly elevated ECV.  Echo 3/22 EF 25-30%, RV moderately reduced.   Volume up last visit 2/2 running out of torsemide, weight up 24 lbs, but stable NYHA II symptoms. Torsemide restarted and instructed to take metolazone x 2 days.   Today he returns for HF follow up with Florentina Addison, paramedic. He feels better after diuresing 20+ lbs. No SOB walking on flat  ground or with stairs. Denies CP, dizziness, edema, or PND/Orthopnea. Appetite ok. No fever or chills. He does not weigh at home but has a scale. Taking all medications. Smoking 1/2 ppd. Works at [employer], [employer] (histocompatibility and immunogenetics), 7p-7a shift. Wears CPAP every night.  Review of systems complete and found to be negative unless listed in HPI.    Past Medical History:  Diagnosis Date   Chronic systolic CHF (congestive heart failure) (HCC)    Hyperlipidemia    Hypertension    Mild mitral regurgitation 2016   NICM (nonischemic cardiomyopathy) (HCC)    Obesity    Sleep apnea 12/24/12   Sleep study February 2014   Current Outpatient Medications  Medication Sig Dispense Refill   amLODipine (NORVASC) 10 MG tablet Take 1 tablet (10 mg total) by mouth daily. 30 tablet 3   atorvastatin (LIPITOR) 10 MG tablet Take 1 tablet (10 mg total) by mouth daily. 30 tablet 3   carvedilol (COREG) 25 MG tablet Take 2 tablets (50 mg total) by mouth 2 (two) times daily. 120 tablet 3   colchicine 0.6 MG tablet Take 2 tablets at first sign of gout flare and then 1 tablet an hour later.  Repeat every 3 days until gout flare subsides. 12 tablet 1   dapagliflozin propanediol (FARXIGA) 10 MG TABS tablet Take 1 tablet (10 mg total) by mouth daily before breakfast. 30 tablet 11   digoxin (LANOXIN) 0.125 MG tablet Take 1 tablet (0.125 mg total) by mouth daily. 30 tablet 3   hydrALAZINE (APRESOLINE) 100 MG tablet Take 1 tablet (100 mg total) by mouth 3 (three) times daily. 90  tablet 6   isosorbide mononitrate (IMDUR) 60 MG 24 hr tablet Take 1 tablet (60 mg total) by mouth daily. 30 tablet 11   loratadine (CLARITIN) 10 MG tablet Take 10 mg by mouth daily as needed for allergies.     metolazone (ZAROXOLYN) 2.5 MG tablet Take 1 tablet (2.5 mg total) by mouth as directed. By heart failure clinic 5 tablet 0   potassium chloride SA (KLOR-CON) 20 MEQ tablet Take 2 tablets (40 mEq total) by mouth as directed. TAKE ONLY WHEN YOU TAKE  METOLAZONE 10 tablet 3   sacubitril-valsartan (ENTRESTO) 97-103 MG Take 1 tablet by mouth 2 (two) times daily. 60 tablet 11   spironolactone (ALDACTONE) 25 MG tablet Take 1 tablet (25 mg total) by mouth daily. 30 tablet 11   torsemide (DEMADEX) 20 MG tablet Take 4 tablets (80 mg total) by mouth 2 (two) times daily. 210 tablet 3   Vitamin D, Ergocalciferol, (DRISDOL) 1.25 MG (50000 UNIT) CAPS capsule Take 50,000 Units by mouth once a week.     No current facility-administered medications for this encounter.   No Known Allergies  Social History   Socioeconomic History   Marital status: Single    Spouse name: Not on file   Number of children: 3   Years of education: Not on file   Highest education level: Not on file  Occupational History   Occupation: Works with Copywriter, advertising: [employer]  Tobacco Use   Smoking status: Light Smoker    Packs/day: 0.05    Years: 22.00    Pack years: 1.10    Types: Cigarettes   Smokeless tobacco: Never  Vaping Use   Vaping Use: Never used  Substance and Sexual Activity   Alcohol use: No    Alcohol/week: 30.0 standard drinks    Types: 30 Standard drinks or equivalent per week    Comment: x 1 week   Drug use: No    Types: Marijuana   Sexual activity: Not on file  Other Topics Concern   Not on file  Social History Narrative   Not on file   Social Determinants of Health   Financial Resource Strain: Low Risk    Difficulty of Paying Living Expenses: Not very hard  Food Insecurity: No Food Insecurity   Worried About Programme researcher, broadcasting/film/video in the Last Year: Never true   Barista in the Last Year: Never true  Transportation Needs: No Transportation Needs   Lack of Transportation (Medical): No   Lack of Transportation (Non-Medical): No  Physical Activity: Not on file  Stress: Not on file  Social Connections: Not on file  Intimate Partner Violence: Not on file   Family History  Problem Relation Age of Onset   Arrhythmia Maternal  Grandfather        Atrial fibrillation   CAD Neg Hx    BP (!) 160/78   Pulse 94   Wt 103.8 kg (228 lb 12.8 oz)   SpO2 91%   BMI 35.84 kg/m   Wt Readings from Last 3 Encounters:  10/08/21 103.8 kg (228 lb 12.8 oz)  10/04/21 114.2 kg (251 lb 12.8 oz)  03/22/21 103.1 kg (227 lb 6.4 oz)   PHYSICAL EXAM: General:  NAD. No resp difficulty HEENT: Normal Neck: Supple. JVP 7-8. Carotids 2+ bilat; no bruits. No lymphadenopathy or thryomegaly appreciated. Cor: PMI nondisplaced. Regular rate & rhythm. No rubs, gallops or murmurs. Lungs: Clear Abdomen: Obese, nontender, nondistended. No hepatosplenomegaly. No bruits  or masses. Good bowel sounds. Extremities: No cyanosis, clubbing, rash, 1-2+ BLE edema Neuro: Alert & oriented x 3, cranial nerves grossly intact. Moves all 4 extremities w/o difficulty. Affect pleasant.  ASSESSMENT & PLAN:  1. Chronic Biventricular Heart Failure  - due to NICM - suspect due to HTN and OSA +/- ETOH - cath 03/2013 no CAD - Echo 11/20 EF 30-35% - Echo 4/21 EF 20-25% moderate RV dysfunction (worse since previous) - Personally reviewed - TEE on 04/18/20. Procedure c/b by severe respiratory distress and laryngospasm. LVEF 25% RV moderately HK. Aortic valve not interrogated completely due to respiratory distress. AI appeared to be moderate.  - Echo 03/22/21 EF 25-30% mod AI Moderate RV dysfunction.  - cMRI 8/21 EF 24% Moderate AI. No LGE or infiltrative process. - NYHA II. Weight down 23 lbs but still has some fluid on board today. - Take metolazone 2.5 mg + extra 40 KCl on Tuesday (he works Quarry manager). - Continue torsemide 80 mg bid - Discussed wearing his TED hose. - Continue Entresto 97/103 mg bid - Continue carvedilol 50 mg bid. - Continue hydralazine 100 mg tid + Imdur 60 mg daily. - Continue Farxiga 10 mg daily. - Continue digoxin 0.125 mg daily. - Suspect CM related to HTN +/- ETOH. AI may be contributing but severity of biventricular dysfunction out of  proportion to AI and LV not overly dilated. - May need future advanced therapies, but needs to be off ETOH and tobacco - Appreciate paramedicine's help  - BMET & dig level today, repeat BMET in 10 days.  2. Moderate aortic regurgitation - Visually mild to moderate on TEE 4/21 but P1/2 time suggesting severe AI.  - TEE on 04/18/20. Procedure c/b by severe respiratory distress and laryngospasm. LVEF 25% RV moderately HK. Aortic valve not interrogated completely due to respiratory distress. AI appeared to be moderate - TEE reviewed with echo team felt to be moderate at worst. - Mild AI on echo 3/22.  3. Hypertension - Elevated today, but he has not had his morning medications. - Plan as above.   4. OSA on CPAP - Fully compliant w/ CPAP.  - Follows with Dr. Mayford Knife.   5. Obesity. - Body mass index is 35.84 kg/m. - Needs weight loss  6. Tobacco use/ETOH - Quit ETOH 1/22, but says he drinks wine very occasionally.  - Continues to smoke, encouraged to quit.   7. ? Fatty Liver - Reports recent diagnosis at [facility], records not available. - Check CMET next visit after diuresis.   Follow up in 3 weeks with APP to reassess volume.   Jacklynn Ganong, FNP  3:04 PM

## 2021-10-08 NOTE — Progress Notes (Signed)
Paramedicine Encounter   Patient ID: Colin Walton , male,   DOB: [birth year],50 y.o.,  MRN: [id number]   Met patient in clinic today with provider.  Weight @ clinic-228  B/p-160/78  P-95 Sp02-90  First visit with the pt in the clinic.  At last visit he was volume overloaded and there were some med changes.   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 He does a lot of walking.  His weight last week was 251, he thinks it was due to him being w/o his torsemide for about a week. He said with his sch he just couldn't get over there to get it.  He denies any complaints today,  Good urine output since Friday.  He has an rx for compression stockings but hasnt p/u yet.  Feet/ankle and abd still distended some. 1 more day of metolazone w/extra potassium-he will wait until his next day off which is Wednesday so he will take it Tuesday night.  He will be back in clinic in 3 wks.  I will f/u next week.  I will also call to check in to see how he is doing on Thursday.  Labs done today.    Marylouise Stacks, [place] 10/08/2021

## 2021-10-11 ENCOUNTER — Telehealth (HOSPITAL_COMMUNITY): Payer: Self-pay

## 2021-10-11 NOTE — Telephone Encounter (Signed)
Contacted pt to f/u on the increased fluid pills from clinic visit the other day.   No answer.  Kerry Hough, EMT-Paramedic  10/11/21

## 2021-10-15 ENCOUNTER — Telehealth (HOSPITAL_COMMUNITY): Payer: Self-pay

## 2021-10-15 NOTE — Telephone Encounter (Signed)
Pt texted me back and reported he meant to respond at weight of 228.   Reports feeling better.  Also reminded him of his lab appointment this week.  We will do home visit next week.   Kerry Hough, EMT-Paramedic  10/15/21

## 2021-10-15 NOTE — Telephone Encounter (Signed)
Pt texted me on Friday to report his weight was down to 249 lbs. But his weight in the chart at last clinic visit was 228-so I sent him message back to check it again.   Kerry Hough, EMT-Paramedic  10/15/21

## 2021-10-19 ENCOUNTER — Ambulatory Visit (HOSPITAL_COMMUNITY)
Admission: RE | Admit: 2021-10-19 | Discharge: 2021-10-19 | Disposition: A | Discharge status: Home / Self Care | Payer: Managed Care, Other (non HMO) | Source: Ambulatory Visit | Attending: Cardiology | Admitting: Cardiology

## 2021-10-19 ENCOUNTER — Other Ambulatory Visit: Payer: Self-pay

## 2021-10-19 DIAGNOSIS — I5040 Unspecified combined systolic (congestive) and diastolic (congestive) heart failure: Secondary | ICD-10-CM | POA: Diagnosis not present

## 2021-10-19 LAB — BASIC METABOLIC PANEL
Anion gap: 11 (ref 5–15)
BUN: 22 mg/dL — ABNORMAL HIGH (ref 6–20)
CO2: 28 mmol/L (ref 22–32)
Calcium: 8.6 mg/dL — ABNORMAL LOW (ref 8.9–10.3)
Chloride: 99 mmol/L (ref 98–111)
Creatinine, Ser: 1.58 mg/dL — ABNORMAL HIGH (ref 0.61–1.24)
GFR, Estimated: 53 mL/min — ABNORMAL LOW (ref >60)
Glucose, Bld: 103 mg/dL — ABNORMAL HIGH (ref 70–99)
Potassium: 3.6 mmol/L (ref 3.5–5.1)
Sodium: 138 mmol/L (ref 135–145)

## 2021-10-25 NOTE — Progress Notes (Signed)
ADVANCED HF CLINIC NOTE   Primary Care: Shanna Cisco, NP Primary Cardiologist: Clifton James AHF: Dr. Gala Romney   Reason for Visit: F/u for Chronic Systolic Heart Failure   HPI: Colin Walton) is a 50 y.o.male with morbid obesity, hypertension, chronic systolic heart failure, due to nonischemic cardiomyopathy, moderate aortic insufficiency, OSA on CPAP, and alcohol use.   He was first diagnosed with CHF in 2013 during a hospitalization. Echo revealed EF of 40-45%. Follow up sleep study revealed severe OSA prompting CPAP therapy (followed by Dr. Mayford Knife). He was also noted to have uncontrolled hypertension. Right and left heart cath 03/2013 with no obstructive CAD and an EF of 55%. Unfortunately, repeat echo 11/2015 revealed and EF of 35-40% and diffuse hypokinesis, trivial AI, and moderately dilated LA.   Echo 1120 revealed an EF of 30-35% with LVH, mildly dilated left atrium, moderate aortic valve regurgitation, and elevated pulmonary pressure. Echo was reviewed and TEE was recommended to better evaluate the aortic valve.   Underwent TEE on 04/18/20. Procedure c/b by severe respiratory distress and laryngospasm. LVEF 25% RV moderately HK. Aortic valve not interrogated completely due to respiratory distress. AI appeared to be moderate  Brother died in 05/20/23 from alcolohlism.   Echo 4/21 EF 20-25% Moderate RV dysfunction (worse from previous) Mild AI - reviewed personally   cMRI 8/21 severely dilated LV with diffuse hypokinesis, EF 24%, mild RV dilation RV EF 32%, moderate AI AV regurgitant fraction 42%, no LGE (difficult images), mildly elevated ECV.  Echo 3/22 EF 25-30%, RV moderately reduced.   Volume up last visit 2/2 running out of torsemide, weight up 24 lbs, but stable NYHA II symptoms. Torsemide restarted and instructed to take metolazone x 2 days. Subsequently diuresed 20+ lbs.  Today he returns for HF follow up. Doing well. Took a metolazone + extra KCl 6 days ago for  swelling. No significant SOB with activity. Followed by Paramedicine. Overall feeling fine. Denies CP, dizziness, edema, or PND/Orthopnea. Appetite ok. No fever or chills. Weight at home 215 pounds. Taking all medications. Smoking 1/2 ppd. Works at [employer], clinical (histocompatibility and immunogenetics), 7p-7a shift. Wears CPAP every night. Rare ETOH.  Review of systems complete and found to be negative unless listed in HPI.    Past Medical History:  Diagnosis Date   Chronic systolic CHF (congestive heart failure) (HCC)    Hyperlipidemia    Hypertension    Mild mitral regurgitation 2016   NICM (nonischemic cardiomyopathy) (HCC)    Obesity    Sleep apnea 12/24/12   Sleep study February 2014   Current Outpatient Medications  Medication Sig Dispense Refill   amLODipine (NORVASC) 10 MG tablet Take 1 tablet (10 mg total) by mouth daily. 30 tablet 3   atorvastatin (LIPITOR) 10 MG tablet Take 1 tablet (10 mg total) by mouth daily. 30 tablet 3   carvedilol (COREG) 25 MG tablet Take 2 tablets (50 mg total) by mouth 2 (two) times daily. 120 tablet 3   colchicine 0.6 MG tablet Take 2 tablets at first sign of gout flare and then 1 tablet an hour later.  Repeat every 3 days until gout flare subsides. 12 tablet 1   dapagliflozin propanediol (FARXIGA) 10 MG TABS tablet Take 1 tablet (10 mg total) by mouth daily before breakfast. 30 tablet 11   digoxin (LANOXIN) 0.125 MG tablet Take 1 tablet (0.125 mg total) by mouth daily. 30 tablet 3   hydrALAZINE (APRESOLINE) 100 MG tablet Take 1 tablet (100 mg total) by mouth  3 (three) times daily. 90 tablet 6   isosorbide mononitrate (IMDUR) 60 MG 24 hr tablet Take 1 tablet (60 mg total) by mouth daily. 30 tablet 11   loratadine (CLARITIN) 10 MG tablet Take 10 mg by mouth daily as needed for allergies.     metolazone (ZAROXOLYN) 2.5 MG tablet Take 1 tablet (2.5 mg total) by mouth as directed. By heart failure clinic 4 tablet 0   potassium chloride SA (KLOR-CON) 20 MEQ tablet Take 2 tablets (40  mEq total) by mouth 2 (two) times daily. 60 tablet 6   sacubitril-valsartan (ENTRESTO) 97-103 MG Take 1 tablet by mouth 2 (two) times daily. 60 tablet 11   spironolactone (ALDACTONE) 25 MG tablet Take 1 tablet (25 mg total) by mouth daily. 30 tablet 5   torsemide (DEMADEX) 20 MG tablet Take 4 tablets (80 mg total) by mouth 2 (two) times daily. 210 tablet 3   Vitamin D, Ergocalciferol, (DRISDOL) 1.25 MG (50000 UNIT) CAPS capsule Take 50,000 Units by mouth once a week.     No current facility-administered medications for this encounter.   No Known Allergies  Social History   Socioeconomic History   Marital status: Single    Spouse name: Not on file   Number of children: 3   Years of education: Not on file   Highest education level: Not on file  Occupational History   Occupation: Works with Land: [employer]  Tobacco Use   Smoking status: Light Smoker    Packs/day: 0.05    Years: 22.00    Pack years: 1.10    Types: Cigarettes   Smokeless tobacco: Never  Vaping Use   Vaping Use: Never used  Substance and Sexual Activity   Alcohol use: No    Alcohol/week: 30.0 standard drinks    Types: 30 Standard drinks or equivalent per week    Comment: x 1 week   Drug use: No    Types: Marijuana   Sexual activity: Not on file  Other Topics Concern   Not on file  Social History Narrative   Not on file   Social Determinants of Health   Financial Resource Strain: Low Risk    Difficulty of Paying Living Expenses: Not very hard  Food Insecurity: No Food Insecurity   Worried About Charity fundraiser in the Last Year: Never true   Arboriculturist in the Last Year: Never true  Transportation Needs: No Transportation Needs   Lack of Transportation (Medical): No   Lack of Transportation (Non-Medical): No  Physical Activity: Not on file  Stress: Not on file  Social Connections: Not on file  Intimate Partner Violence: Not on file   Family History  Problem Relation Age of  Onset   Arrhythmia Maternal Grandfather        Atrial fibrillation   CAD Neg Hx    BP 120/60   Pulse 94   Wt 95.6 kg (210 lb 12.8 oz)   SpO2 94%   BMI 33.02 kg/m   Wt Readings from Last 3 Encounters:  10/29/21 95.6 kg (210 lb 12.8 oz)  10/08/21 103.8 kg (228 lb 12.8 oz)  10/04/21 114.2 kg (251 lb 12.8 oz)   PHYSICAL EXAM: General:  NAD. No resp difficulty HEENT: Normal Neck: Supple. JVP 6-7. Carotids 2+ bilat; no bruits. No lymphadenopathy or thryomegaly appreciated. Cor: PMI nondisplaced. Regular rate & rhythm. No rubs, gallops or murmurs. Lungs: Clear Abdomen: Obese, nontender, nondistended. No hepatosplenomegaly. No bruits  or masses. Good bowel sounds. Extremities: No cyanosis, clubbing, rash, edema Neuro: Alert & oriented x 3, cranial nerves grossly intact. Moves all 4 extremities w/o difficulty. Affect pleasant.  ASSESSMENT & PLAN: 1. Chronic Biventricular Heart Failure  - due to NICM - suspect due to HTN and OSA +/- ETOH - cath 03/2013 no CAD - Echo 11/20 EF 30-35% - Echo 4/21 EF 20-25% moderate RV dysfunction (worse since previous) - Personally reviewed - TEE on 04/18/20. Procedure c/b by severe respiratory distress and laryngospasm. LVEF 25% RV moderately HK. Aortic valve not interrogated completely due to respiratory distress. AI appeared to be moderate.  - cMRI 8/21 EF 24% Moderate AI. No LGE or infiltrative process. - Echo 03/22/21 EF 25-30% mod AI Moderate RV dysfunction.  - Better NYHA II. Volume looks good today. - Continue torsemide 80 mg bid. OK to take metolazone 2.5 mg + extra 40 KCL PRN (1-2x/month max) - Discussed wearing his TED hose. - Continue spironolactone 25 mg daily. - Continue Entresto 97/103 mg bid - Continue carvedilol 50 mg bid. - Continue hydralazine 100 mg tid + Imdur 60 mg daily. - Continue Farxiga 10 mg daily. - Continue digoxin 0.125 mg daily.  - Suspect CM related to HTN +/- ETOH. AI may be contributing but severity of biventricular  dysfunction out of proportion to AI and LV not overly dilated. - May need future advanced therapies, but needs to be off ETOH and tobacco - Appreciate paramedicine's help. - Repeat Echo. He has been compliant with meds, if EF remains <35%, refer to EP for ICD consideration. - BMET & dig level today.   2. Moderate aortic regurgitation - Visually mild to moderate on TEE 4/21 but P1/2 time suggesting severe AI.  - TEE on 04/18/20. Procedure c/b by severe respiratory distress and laryngospasm. LVEF 25% RV moderately HK. Aortic valve not interrogated completely due to respiratory distress. AI appeared to be moderate - TEE reviewed with echo team felt to be moderate at worst. - Mild AI on echo 3/22.  3. Hypertension - Well-controlled today. - Continue current medications.   4. OSA on CPAP - Fully compliant w/ CPAP.  - Follows with Dr. Mayford Knife.   5. Obesity. - Body mass index is 33.02 kg/m. - Needs weight loss  6. Tobacco use/ETOH - Drinks occasionally.  - Continues to smoke, encouraged to quit.   7. ? Fatty Liver - Reports recent diagnosis at [facility], records not available. - CMET 10/22 ok.  Follow up in 3 months with Dr. Gala Romney as scheduled + echo.  Jacklynn Ganong, FNP  9:40 AM

## 2021-10-29 ENCOUNTER — Ambulatory Visit (HOSPITAL_COMMUNITY)
Admission: RE | Admit: 2021-10-29 | Discharge: 2021-10-29 | Disposition: A | Discharge status: Home / Self Care | Payer: Managed Care, Other (non HMO) | Source: Ambulatory Visit | Attending: Internal Medicine | Admitting: Internal Medicine

## 2021-10-29 ENCOUNTER — Telehealth (HOSPITAL_COMMUNITY): Payer: Self-pay

## 2021-10-29 ENCOUNTER — Encounter (HOSPITAL_COMMUNITY): Payer: Self-pay

## 2021-10-29 ENCOUNTER — Other Ambulatory Visit: Payer: Self-pay

## 2021-10-29 VITALS — BP 120/60 | HR 94 | Wt 210.8 lb

## 2021-10-29 DIAGNOSIS — I351 Nonrheumatic aortic (valve) insufficiency: Secondary | ICD-10-CM | POA: Diagnosis not present

## 2021-10-29 DIAGNOSIS — I110 Hypertensive heart disease with heart failure: Secondary | ICD-10-CM | POA: Insufficient documentation

## 2021-10-29 DIAGNOSIS — K760 Fatty (change of) liver, not elsewhere classified: Secondary | ICD-10-CM | POA: Diagnosis not present

## 2021-10-29 DIAGNOSIS — F1090 Alcohol use, unspecified, uncomplicated: Secondary | ICD-10-CM | POA: Diagnosis not present

## 2021-10-29 DIAGNOSIS — F17210 Nicotine dependence, cigarettes, uncomplicated: Secondary | ICD-10-CM | POA: Insufficient documentation

## 2021-10-29 DIAGNOSIS — F1011 Alcohol abuse, in remission: Secondary | ICD-10-CM

## 2021-10-29 DIAGNOSIS — Z720 Tobacco use: Secondary | ICD-10-CM

## 2021-10-29 DIAGNOSIS — Z9989 Dependence on other enabling machines and devices: Secondary | ICD-10-CM | POA: Diagnosis not present

## 2021-10-29 DIAGNOSIS — Z79899 Other long term (current) drug therapy: Secondary | ICD-10-CM | POA: Diagnosis not present

## 2021-10-29 DIAGNOSIS — I10 Essential (primary) hypertension: Secondary | ICD-10-CM | POA: Diagnosis not present

## 2021-10-29 DIAGNOSIS — I5082 Biventricular heart failure: Secondary | ICD-10-CM | POA: Diagnosis present

## 2021-10-29 DIAGNOSIS — Z811 Family history of alcohol abuse and dependence: Secondary | ICD-10-CM | POA: Diagnosis not present

## 2021-10-29 DIAGNOSIS — E669 Obesity, unspecified: Secondary | ICD-10-CM

## 2021-10-29 DIAGNOSIS — G4733 Obstructive sleep apnea (adult) (pediatric): Secondary | ICD-10-CM | POA: Insufficient documentation

## 2021-10-29 DIAGNOSIS — I428 Other cardiomyopathies: Secondary | ICD-10-CM | POA: Insufficient documentation

## 2021-10-29 DIAGNOSIS — I5040 Unspecified combined systolic (congestive) and diastolic (congestive) heart failure: Secondary | ICD-10-CM

## 2021-10-29 DIAGNOSIS — Z6833 Body mass index (BMI) 33.0-33.9, adult: Secondary | ICD-10-CM | POA: Insufficient documentation

## 2021-10-29 DIAGNOSIS — Z7984 Long term (current) use of oral hypoglycemic drugs: Secondary | ICD-10-CM | POA: Insufficient documentation

## 2021-10-29 LAB — BASIC METABOLIC PANEL
Anion gap: 14 (ref 5–15)
BUN: 41 mg/dL — ABNORMAL HIGH (ref 6–20)
CO2: 34 mmol/L — ABNORMAL HIGH (ref 22–32)
Calcium: 9.6 mg/dL (ref 8.9–10.3)
Chloride: 90 mmol/L — ABNORMAL LOW (ref 98–111)
Creatinine, Ser: 2.31 mg/dL — ABNORMAL HIGH (ref 0.61–1.24)
GFR, Estimated: 34 mL/min — ABNORMAL LOW (ref >60)
Glucose, Bld: 122 mg/dL — ABNORMAL HIGH (ref 70–99)
Potassium: 3.8 mmol/L (ref 3.5–5.1)
Sodium: 138 mmol/L (ref 135–145)

## 2021-10-29 LAB — DIGOXIN LEVEL: Digoxin Level: <0.2 ng/mL — ABNORMAL LOW (ref 0.8–2.0)

## 2021-10-29 NOTE — Telephone Encounter (Signed)
-----   Message from Jacklynn Ganong, [place] sent at 10/29/2021 11:33 AM EST ----- Kidney function is elevated, like due to recent metolazone use. Please do not take anymore metolazone unless directed by HF clinic.   Please decrease torsemide to 60 mg bid x 2 days then may resume 80 mg bid after.   Repeat BMET in 10 days

## 2021-10-29 NOTE — Telephone Encounter (Signed)
Patient advised and verbalized understanding. Lab appt scheduled, lab order entered.   Orders Placed This Encounter  Procedures   Basic metabolic panel    Standing Status:   Future    Standing Expiration Date:   10/29/2022    Order Specific Question:   Release to patient    Answer:   Immediate

## 2021-10-29 NOTE — Patient Instructions (Addendum)
Labs were done today, if any labs are abnormal the clinic will call you  Your physician recommends that you schedule a follow-up appointment in: please keep your scheduled appointment with echocardiogram  At the [facility], you and your health needs are our priority. As part of our continuing mission to provide you with exceptional heart care, we have created designated Provider Care Teams. These Care Teams include your primary Cardiologist (physician) and Advanced Practice Providers (APPs- Physician Assistants and Nurse Practitioners) who all work together to provide you with the care you need, when you need it.   You may see any of the following providers on your designated Care Team at your next follow up: Dr Arvilla Meres Dr Carron Curie, NP Robbie Lis, [place] [facility] [place], [place] Karle Plumber, PharmD   Please be sure to bring in all your medications bottles to every appointment.   If you have any questions or concerns before your next appointment please send [place] a message through [place] or call our office at [phone].    TO LEAVE A MESSAGE FOR THE NURSE SELECT OPTION 2, PLEASE LEAVE A MESSAGE INCLUDING: YOUR NAME DATE OF BIRTH CALL BACK NUMBER REASON FOR CALL**this is important as we prioritize the call backs  YOU WILL RECEIVE A CALL BACK THE SAME DAY AS LONG AS YOU CALL BEFORE 4:00 PM

## 2021-10-30 ENCOUNTER — Other Ambulatory Visit (HOSPITAL_COMMUNITY): Payer: Self-pay

## 2021-10-30 NOTE — Progress Notes (Signed)
Paramedicine Encounter    Patient ID: Tessie Fass, male    DOB: [birth year], 50 y.o.   MRN: [id number]   Patient Care Team: Shanna Cisco, NP as PCP - General (Nurse Practitioner) Kathleene Hazel, MD as PCP - Cardiology (Cardiology) Quintella Reichert, MD as PCP - Sleep Medicine (Cardiology)  Patient Active Problem List   Diagnosis Date Noted   Alcohol abuse 04/24/2013   Essential hypertension, benign 04/24/2013   CHF (congestive heart failure) (HCC) 01/18/2013   Bleeding nose 01/05/2013   OSA (obstructive sleep apnea) 01/05/2013   Health care maintenance 01/05/2013    Current Outpatient Medications:    amLODipine (NORVASC) 10 MG tablet, Take 1 tablet (10 mg total) by mouth daily., Disp: 30 tablet, Rfl: 3   atorvastatin (LIPITOR) 10 MG tablet, Take 1 tablet (10 mg total) by mouth daily., Disp: 30 tablet, Rfl: 3   carvedilol (COREG) 25 MG tablet, Take 2 tablets (50 mg total) by mouth 2 (two) times daily., Disp: 120 tablet, Rfl: 3   colchicine 0.6 MG tablet, Take 2 tablets at first sign of gout flare and then 1 tablet an hour later.  Repeat every 3 days until gout flare subsides., Disp: 12 tablet, Rfl: 1   dapagliflozin propanediol (FARXIGA) 10 MG TABS tablet, Take 1 tablet (10 mg total) by mouth daily before breakfast., Disp: 30 tablet, Rfl: 11   digoxin (LANOXIN) 0.125 MG tablet, Take 1 tablet (0.125 mg total) by mouth daily., Disp: 30 tablet, Rfl: 3   hydrALAZINE (APRESOLINE) 100 MG tablet, Take 1 tablet (100 mg total) by mouth 3 (three) times daily., Disp: 90 tablet, Rfl: 6   isosorbide mononitrate (IMDUR) 60 MG 24 hr tablet, Take 1 tablet (60 mg total) by mouth daily., Disp: 30 tablet, Rfl: 11   loratadine (CLARITIN) 10 MG tablet, Take 10 mg by mouth daily as needed for allergies., Disp: , Rfl:    metolazone (ZAROXOLYN) 2.5 MG tablet, Take 1 tablet (2.5 mg total) by mouth as directed. By heart failure clinic, Disp: 4 tablet, Rfl: 0   potassium chloride SA (KLOR-CON) 20  MEQ tablet, Take 2 tablets (40 mEq total) by mouth 2 (two) times daily., Disp: 60 tablet, Rfl: 6   sacubitril-valsartan (ENTRESTO) 97-103 MG, Take 1 tablet by mouth 2 (two) times daily., Disp: 60 tablet, Rfl: 11   spironolactone (ALDACTONE) 25 MG tablet, Take 1 tablet (25 mg total) by mouth daily., Disp: 30 tablet, Rfl: 5   torsemide (DEMADEX) 20 MG tablet, Take 4 tablets (80 mg total) by mouth 2 (two) times daily., Disp: 210 tablet, Rfl: 3   Vitamin D, Ergocalciferol, (DRISDOL) 1.25 MG (50000 UNIT) CAPS capsule, Take 50,000 Units by mouth once a week., Disp: , Rfl:  No Known Allergies    Social History   Socioeconomic History   Marital status: Single    Spouse name: Not on file   Number of children: 3   Years of education: Not on file   Highest education level: Not on file  Occupational History   Occupation: Works with Copywriter, advertising: [employer]  Tobacco Use   Smoking status: Light Smoker    Packs/day: 0.05    Years: 22.00    Pack years: 1.10    Types: Cigarettes   Smokeless tobacco: Never  Vaping Use   Vaping Use: Never used  Substance and Sexual Activity   Alcohol use: No    Alcohol/week: 30.0 standard drinks    Types: 30 Standard drinks or equivalent  per week    Comment: x 1 week   Drug use: No    Types: Marijuana   Sexual activity: Not on file  Other Topics Concern   Not on file  Social History Narrative   Not on file   Social Determinants of Health   Financial Resource Strain: Low Risk    Difficulty of Paying Living Expenses: Not very hard  Food Insecurity: No Food Insecurity   Worried About Running Out of Food in the Last Year: Never true   Ran Out of Food in the Last Year: Never true  Transportation Needs: No Transportation Needs   Lack of Transportation (Medical): No   Lack of Transportation (Non-Medical): No  Physical Activity: Not on file  Stress: Not on file  Social Connections: Not on file  Intimate Partner Violence: Not on file    Physical  Exam      Future Appointments  Date Time Provider Department Center  11/07/2021  3:15 PM [facility] LAB [facility] None  01/28/2022  9:00 AM [facility] ECHO OP 1 [facility]  01/26/2022 10:00 AM Bensimhon, Bevelyn Buckles, MD [facility] None    BP (!) 150/72   Pulse 100   Resp 18   Wt 210 lb (95.3 kg)   BMI 32.89 kg/m  B/p after a few min-130/70 P-92 Weight yesterday-210 Last visit weight-228   Pt reports he is doing well, he had clinic appoint yesterday. Fluid level is better today.  Weight is down.  He just took his meds about ago.  B/p was good yesterday.  Pt denies increased sob, no dizziness, no c/p.  Not interested in quitting smoking.   Denies needing assistance right now.   Kerry Hough, EMT-Paramedic [phone] [facility] Paramedic  10/30/21

## 2021-11-06 ENCOUNTER — Telehealth (HOSPITAL_COMMUNITY): Payer: Self-pay

## 2021-11-06 NOTE — Telephone Encounter (Signed)
Sent pt text message asking how he was feeling, not sure of his work sch this week.   As of 2:17 PM  he has not returned text.  (He works 3rd shift)   Will f/u.    Kerry Hough, EMT-Paramedic  11/06/21

## 2021-11-07 ENCOUNTER — Other Ambulatory Visit: Payer: Self-pay

## 2021-11-07 ENCOUNTER — Ambulatory Visit (HOSPITAL_COMMUNITY)
Admission: RE | Admit: 2021-11-07 | Discharge: 2021-11-07 | Disposition: A | Discharge status: Home / Self Care | Payer: Managed Care, Other (non HMO) | Source: Ambulatory Visit | Attending: Internal Medicine | Admitting: Internal Medicine

## 2021-11-07 DIAGNOSIS — I5040 Unspecified combined systolic (congestive) and diastolic (congestive) heart failure: Secondary | ICD-10-CM | POA: Diagnosis present

## 2021-11-07 LAB — BASIC METABOLIC PANEL
Anion gap: 13 (ref 5–15)
BUN: 19 mg/dL (ref 6–20)
CO2: 26 mmol/L (ref 22–32)
Calcium: 9.1 mg/dL (ref 8.9–10.3)
Chloride: 100 mmol/L (ref 98–111)
Creatinine, Ser: 1.18 mg/dL (ref 0.61–1.24)
GFR, Estimated: >60 mL/min (ref >60)
Glucose, Bld: 122 mg/dL — ABNORMAL HIGH (ref 70–99)
Potassium: 3.5 mmol/L (ref 3.5–5.1)
Sodium: 139 mmol/L (ref 135–145)

## 2021-11-21 ENCOUNTER — Telehealth (HOSPITAL_COMMUNITY): Payer: Self-pay

## 2021-11-21 NOTE — Telephone Encounter (Signed)
Pt called me requesting that he cancel our appointment for tomor due to him not feeling well. He said he thinks he is having a gout flare and is trying to contact doc to get him some meds for it.   Will f/u next week.   Kerry Hough, EMT-Paramedic  11/21/21

## 2021-11-26 ENCOUNTER — Telehealth (HOSPITAL_COMMUNITY): Payer: Self-pay

## 2021-11-27 NOTE — Telephone Encounter (Signed)
Texted pt yesterday for f/u on his complaint of not feeling last week and to see what his work sch is for this week.  No response as of yet today.   Kerry Hough, EMT-Paramedic  11/27/21

## 2021-11-28 ENCOUNTER — Telehealth (HOSPITAL_COMMUNITY): Payer: Self-pay | Admitting: Licensed Clinical Social Worker

## 2021-11-28 NOTE — Telephone Encounter (Signed)
HF Paramedicine Team Based Care Meeting  HF MD- NA  HF NP - Amy Clegg NP-C   [facility] HF Paramedicine  Katie Vicente Males  [facility] admit within the last 30 days for heart failure?   Medications concerns? Recent compliance but currently doing what he needs to   Transportation issues ? no  Education needs? no  SDOH -no  Eligible for discharge? Hopeful for discharge soon- had recent fluid increase due to medication noncompliance but getting back on track so will see how he does.  Burna Sis, LCSW Clinical Social Worker [facility] Desk#: [phone] Cell#: [phone]

## 2021-12-19 ENCOUNTER — Telehealth (HOSPITAL_COMMUNITY): Payer: Self-pay

## 2021-12-24 ENCOUNTER — Telehealth (HOSPITAL_COMMUNITY): Payer: Self-pay

## 2021-12-24 NOTE — Telephone Encounter (Signed)
Reached out to pt get see if he is available for a home visit, no response. Will try again next week.   Kerry Hough, EMT-Paramedic  12/19/2021

## 2021-12-24 NOTE — Telephone Encounter (Signed)
Pt responded back that he is working all this week and will be avail next Wednesday 1/11.  Got him down for that day.   Kerry Hough, EMT-Paramedic  12/24/21

## 2022-01-01 ENCOUNTER — Telehealth (HOSPITAL_COMMUNITY): Payer: Self-pay

## 2022-01-01 NOTE — Telephone Encounter (Signed)
Pt reached out to see if we can resch our appointment from tomor to another day. I offered anytime next week, he asked I see him on Monday at 2 at his moms house.   Kerry Hough, EMT-Paramedic  01/01/22

## 2022-01-07 ENCOUNTER — Other Ambulatory Visit (HOSPITAL_COMMUNITY): Payer: Self-pay

## 2022-01-07 NOTE — Progress Notes (Signed)
Paramedicine Encounter    Patient ID: Colin Walton, male    DOB: [birth year], 51 y.o.   MRN: [id number]  Home visit today, its been quite a few wks since ive been able to see him with his sch and the holidays and etc.  He sometimes forgets the spiro.  His weight is up 9lbs since nov. Now we have passed thanksgiving, christmas and new years so some of that could be holiday weight.   He reports med compliance with the other meds.  Reminded him of the appoint next month.  Pt denies increased sob, no dizziness, no c/p.  He does report feeling more tired a few wks ago, that lasted approx 1 week, maybe longer. He did sleep a lot more than usual.  He thinks it may be due to at that time he was working a lot more too.  He does report compliance with his CPAP device. He said that is his best friend and uses it even for napping.  He does report the farxiga is $50 right now, his insurance switched from [place] to [facility]. Will see if he can still get co-pay card for it.   [facility] P[facility] at [facility].  His b/p elevated but he just got up and went to bathroom.   Still not interested in quitting smoking.  He does have edema to his LE, he says yesterday they were fine, he did have sausage for supper last night. Advised him to limit his sodium intake and limit his fluids, use his compression stockings if he can get them on and elevate feet while sitting.  He is due for his mid dose of hydralazine.  I asked him to call or text me end of this week with his weights and I will f/u next week.    BP (!) 160/80    Pulse 96    Resp 20    Wt 219 lb (99.3 kg)    SpO2 96%    BMI 34.30 kg/m  Weight yesterday-? Last visit weight-210  Patient Care Team: Riki Sheer, NP as PCP - General (Nurse Practitioner) Burnell Blanks, MD as PCP - Cardiology (Cardiology) Sueanne Margarita, MD as PCP - Sleep Medicine (Cardiology)  Patient Active Problem List   Diagnosis Date Noted   Alcohol abuse 04/24/2013    Essential hypertension, benign 04/24/2013   CHF (congestive heart failure) ([place]) 01/18/2013   Bleeding nose 01/05/2013   OSA (obstructive sleep apnea) 01/05/2013   Health care maintenance 01/05/2013    Current Outpatient Medications:    amLODipine (NORVASC) 10 MG tablet, Take 1 tablet (10 mg total) by mouth daily., Disp: 30 tablet, Rfl: 3   atorvastatin (LIPITOR) 10 MG tablet, Take 1 tablet (10 mg total) by mouth daily., Disp: 30 tablet, Rfl: 3   carvedilol (COREG) 25 MG tablet, Take 2 tablets (50 mg total) by mouth 2 (two) times daily., Disp: 120 tablet, Rfl: 3   colchicine 0.6 MG tablet, Take 2 tablets at first sign of gout flare and then 1 tablet an hour later.  Repeat every 3 days until gout flare subsides., Disp: 12 tablet, Rfl: 1   dapagliflozin propanediol (FARXIGA) 10 MG TABS tablet, Take 1 tablet (10 mg total) by mouth daily before breakfast., Disp: 30 tablet, Rfl: 11   digoxin (LANOXIN) 0.125 MG tablet, Take 1 tablet (0.125 mg total) by mouth daily., Disp: 30 tablet, Rfl: 3   hydrALAZINE (APRESOLINE) 100 MG tablet, Take 1 tablet (100 mg total) by mouth  3 (three) times daily., Disp: 90 tablet, Rfl: 6   isosorbide mononitrate (IMDUR) 60 MG 24 hr tablet, Take 1 tablet (60 mg total) by mouth daily., Disp: 30 tablet, Rfl: 11   potassium chloride SA (KLOR-CON) 20 MEQ tablet, Take 2 tablets (40 mEq total) by mouth 2 (two) times daily., Disp: 60 tablet, Rfl: 6   sacubitril-valsartan (ENTRESTO) 97-103 MG, Take 1 tablet by mouth 2 (two) times daily., Disp: 60 tablet, Rfl: 11   spironolactone (ALDACTONE) 25 MG tablet, Take 1 tablet (25 mg total) by mouth daily., Disp: 30 tablet, Rfl: 5   torsemide (DEMADEX) 20 MG tablet, Take 4 tablets (80 mg total) by mouth 2 (two) times daily., Disp: 210 tablet, Rfl: 3   Vitamin D, Ergocalciferol, (DRISDOL) 1.25 MG (50000 UNIT) CAPS capsule, Take 50,000 Units by mouth once a week., Disp: , Rfl:    loratadine (CLARITIN) 10 MG tablet, Take 10 mg by mouth daily as  needed for allergies. (Patient not taking: Reported on 01/07/2022), Disp: , Rfl:    metolazone (ZAROXOLYN) 2.5 MG tablet, Take 1 tablet (2.5 mg total) by mouth as directed. By heart failure clinic (Patient not taking: Reported on 01/07/2022), Disp: 4 tablet, Rfl: 0 No Known Allergies    Social History   Socioeconomic History   Marital status: Single    Spouse name: Not on file   Number of children: 3   Years of education: Not on file   Highest education level: Not on file  Occupational History   Occupation: Works with Land: [employer]  Tobacco Use   Smoking status: Light Smoker    Packs/day: 0.05    Years: 22.00    Pack years: 1.10    Types: Cigarettes   Smokeless tobacco: Never  Vaping Use   Vaping Use: Never used  Substance and Sexual Activity   Alcohol use: No    Alcohol/week: 30.0 standard drinks    Types: 30 Standard drinks or equivalent per week    Comment: x 1 week   Drug use: No    Types: Marijuana   Sexual activity: Not on file  Other Topics Concern   Not on file  Social History Narrative   Not on file   Social Determinants of Health   Financial Resource Strain: Low Risk    Difficulty of Paying Living Expenses: Not very hard  Food Insecurity: No Food Insecurity   Worried About Charity fundraiser in the Last Year: Never true   Arboriculturist in the Last Year: Never true  Transportation Needs: No Transportation Needs   Lack of Transportation (Medical): No   Lack of Transportation (Non-Medical): No  Physical Activity: Not on file  Stress: Not on file  Social Connections: Not on file  Intimate Partner Violence: Not on file    Physical Exam      Future Appointments  Date Time Provider [place]  02/15/2022  9:00 AM [facility] ECHO OP 1 [facility]  02/19/2022 10:00 AM Bensimhon, Shaune Pascal, MD [place] None       Marylouise Stacks, [place] Paramedic  01/07/22

## 2022-01-09 ENCOUNTER — Telehealth (HOSPITAL_COMMUNITY): Payer: Self-pay | Admitting: Licensed Clinical Social Worker

## 2022-01-09 NOTE — Telephone Encounter (Signed)
HF Paramedicine Team Based Care Meeting  HF MD- NA  HF NP - Amy Clegg NP-C   [facility] HF Paramedicine  Katie Vicente Males  [facility] admit within the last 30 days for heart failure? no  Medications concerns? Concern for not taking meds as fluid and BP were up last visit  Eligible for discharge? Not at this time trying to figure out why pt symptoms uncontrolled at this time  Burna Sis, LCSW Clinical Social Worker [facility] Desk#: [phone] Cell#: [phone]

## 2022-01-14 ENCOUNTER — Telehealth (HOSPITAL_COMMUNITY): Payer: Self-pay

## 2022-01-14 NOTE — Telephone Encounter (Signed)
I reached out to pt to f/u from last week. He reports losing 2 lbs but the swelling in his legs have gone away.  Will f/u next week in the home.    Marylouise Stacks, EMT-Paramedic  01/14/22

## 2022-01-21 ENCOUNTER — Telehealth (HOSPITAL_COMMUNITY): Payer: Self-pay

## 2022-01-28 NOTE — Telephone Encounter (Signed)
Attempted to reach out to sch home visit with pt this week, no answer.  Will try again next week.   He does work 3rd rotating shifts, so it sometimes  can be difficult to sch with him.   Kerry Hough, EMT-Paramedic  01/28/22

## 2022-01-31 ENCOUNTER — Telehealth (HOSPITAL_COMMUNITY): Payer: Self-pay

## 2022-01-31 NOTE — Telephone Encounter (Signed)
Pt reached out ref our home visit today, he reports having a sore throat and cough, I said we could still meet today, he later then responded back to me that he made appointment with his PCP today regarding this and for [place] to resch for next week if possible.   Kerry Hough, EMT-Paramedic  01/31/22

## 2022-02-06 ENCOUNTER — Telehealth (HOSPITAL_COMMUNITY): Payer: Self-pay

## 2022-02-06 NOTE — Telephone Encounter (Signed)
Per our conversation last week, I had asked him if I could come out on Wednesday at 10-he didn't respond to text.  I tried calling to confirm but his phone goes straight to VM.  Sent another text asking if he was awake and still ok for me to come out, after waiting approx 15 min no response.    Will try again next week.   Kerry Hough, EMT-Paramedic  02/06/22

## 2022-02-08 ENCOUNTER — Inpatient Hospital Stay (HOSPITAL_COMMUNITY)
Admission: AD | Admit: 2022-02-08 | Discharge: 2022-03-23 | DRG: 001 | Disposition: E | Payer: 59 | Source: Ambulatory Visit | Attending: Internal Medicine | Admitting: Internal Medicine

## 2022-02-08 ENCOUNTER — Ambulatory Visit (HOSPITAL_BASED_OUTPATIENT_CLINIC_OR_DEPARTMENT_OTHER)
Admission: RE | Admit: 2022-02-08 | Discharge: 2022-02-08 | Disposition: A | Discharge status: Home / Self Care | Payer: 59 | Source: Ambulatory Visit | Attending: Internal Medicine | Admitting: Internal Medicine

## 2022-02-08 ENCOUNTER — Ambulatory Visit
Admission: RE | Admit: 2022-02-08 | Discharge: 2022-02-08 | Disposition: A | Discharge status: Home / Self Care | Payer: Self-pay | Source: Ambulatory Visit | Attending: Adult Health | Admitting: Adult Health

## 2022-02-08 ENCOUNTER — Ambulatory Visit (HOSPITAL_COMMUNITY)
Admission: RE | Admit: 2022-02-08 | Discharge: 2022-02-08 | Disposition: A | Discharge status: Home / Self Care | Payer: 59 | Source: Ambulatory Visit | Attending: Internal Medicine | Admitting: Internal Medicine

## 2022-02-08 ENCOUNTER — Other Ambulatory Visit: Payer: Self-pay

## 2022-02-08 ENCOUNTER — Encounter (HOSPITAL_COMMUNITY): Payer: Self-pay | Admitting: Internal Medicine

## 2022-02-08 VITALS — BP 180/86 | HR 106 | Ht 67.0 in | Wt 233.6 lb

## 2022-02-08 DIAGNOSIS — I083 Combined rheumatic disorders of mitral, aortic and tricuspid valves: Secondary | ICD-10-CM | POA: Diagnosis not present

## 2022-02-08 DIAGNOSIS — Z20822 Contact with and (suspected) exposure to covid-19: Secondary | ICD-10-CM | POA: Diagnosis present

## 2022-02-08 DIAGNOSIS — I272 Pulmonary hypertension, unspecified: Secondary | ICD-10-CM | POA: Diagnosis present

## 2022-02-08 DIAGNOSIS — Z9889 Other specified postprocedural states: Secondary | ICD-10-CM

## 2022-02-08 DIAGNOSIS — I509 Heart failure, unspecified: Secondary | ICD-10-CM

## 2022-02-08 DIAGNOSIS — F1010 Alcohol abuse, uncomplicated: Secondary | ICD-10-CM | POA: Diagnosis present

## 2022-02-08 DIAGNOSIS — I428 Other cardiomyopathies: Secondary | ICD-10-CM | POA: Diagnosis present

## 2022-02-08 DIAGNOSIS — I5023 Acute on chronic systolic (congestive) heart failure: Secondary | ICD-10-CM

## 2022-02-08 DIAGNOSIS — E46 Unspecified protein-calorie malnutrition: Secondary | ICD-10-CM | POA: Diagnosis present

## 2022-02-08 DIAGNOSIS — I110 Hypertensive heart disease with heart failure: Secondary | ICD-10-CM | POA: Diagnosis present

## 2022-02-08 DIAGNOSIS — I4901 Ventricular fibrillation: Secondary | ICD-10-CM | POA: Diagnosis not present

## 2022-02-08 DIAGNOSIS — D62 Acute posthemorrhagic anemia: Secondary | ICD-10-CM | POA: Diagnosis not present

## 2022-02-08 DIAGNOSIS — K045 Chronic apical periodontitis: Secondary | ICD-10-CM | POA: Diagnosis not present

## 2022-02-08 DIAGNOSIS — K029 Dental caries, unspecified: Secondary | ICD-10-CM

## 2022-02-08 DIAGNOSIS — I255 Ischemic cardiomyopathy: Secondary | ICD-10-CM | POA: Diagnosis present

## 2022-02-08 DIAGNOSIS — K0889 Other specified disorders of teeth and supporting structures: Secondary | ICD-10-CM | POA: Diagnosis not present

## 2022-02-08 DIAGNOSIS — I4891 Unspecified atrial fibrillation: Secondary | ICD-10-CM | POA: Diagnosis not present

## 2022-02-08 DIAGNOSIS — R140 Abdominal distension (gaseous): Secondary | ICD-10-CM

## 2022-02-08 DIAGNOSIS — R451 Restlessness and agitation: Secondary | ICD-10-CM | POA: Diagnosis not present

## 2022-02-08 DIAGNOSIS — F17210 Nicotine dependence, cigarettes, uncomplicated: Secondary | ICD-10-CM | POA: Diagnosis present

## 2022-02-08 DIAGNOSIS — Z01818 Encounter for other preprocedural examination: Secondary | ICD-10-CM

## 2022-02-08 DIAGNOSIS — E11649 Type 2 diabetes mellitus with hypoglycemia without coma: Secondary | ICD-10-CM | POA: Diagnosis not present

## 2022-02-08 DIAGNOSIS — Z515 Encounter for palliative care: Secondary | ICD-10-CM | POA: Diagnosis not present

## 2022-02-08 DIAGNOSIS — R197 Diarrhea, unspecified: Secondary | ICD-10-CM | POA: Diagnosis not present

## 2022-02-08 DIAGNOSIS — G4733 Obstructive sleep apnea (adult) (pediatric): Secondary | ICD-10-CM | POA: Diagnosis present

## 2022-02-08 DIAGNOSIS — D689 Coagulation defect, unspecified: Secondary | ICD-10-CM | POA: Diagnosis present

## 2022-02-08 DIAGNOSIS — N170 Acute kidney failure with tubular necrosis: Secondary | ICD-10-CM | POA: Diagnosis present

## 2022-02-08 DIAGNOSIS — I5082 Biventricular heart failure: Secondary | ICD-10-CM | POA: Diagnosis present

## 2022-02-08 DIAGNOSIS — R570 Cardiogenic shock: Secondary | ICD-10-CM | POA: Diagnosis present

## 2022-02-08 DIAGNOSIS — R5082 Postprocedural fever: Secondary | ICD-10-CM | POA: Diagnosis not present

## 2022-02-08 DIAGNOSIS — F40232 Fear of other medical care: Secondary | ICD-10-CM | POA: Diagnosis not present

## 2022-02-08 DIAGNOSIS — I2510 Atherosclerotic heart disease of native coronary artery without angina pectoris: Secondary | ICD-10-CM | POA: Diagnosis present

## 2022-02-08 DIAGNOSIS — E875 Hyperkalemia: Secondary | ICD-10-CM | POA: Diagnosis not present

## 2022-02-08 DIAGNOSIS — Z952 Presence of prosthetic heart valve: Secondary | ICD-10-CM

## 2022-02-08 DIAGNOSIS — I472 Ventricular tachycardia, unspecified: Secondary | ICD-10-CM | POA: Diagnosis not present

## 2022-02-08 DIAGNOSIS — K083 Retained dental root: Secondary | ICD-10-CM | POA: Diagnosis not present

## 2022-02-08 DIAGNOSIS — I462 Cardiac arrest due to underlying cardiac condition: Secondary | ICD-10-CM | POA: Diagnosis present

## 2022-02-08 DIAGNOSIS — E8729 Other acidosis: Secondary | ICD-10-CM | POA: Diagnosis present

## 2022-02-08 DIAGNOSIS — K0850 Unsatisfactory restoration of tooth, unspecified: Secondary | ICD-10-CM | POA: Diagnosis not present

## 2022-02-08 DIAGNOSIS — Z7189 Other specified counseling: Secondary | ICD-10-CM | POA: Diagnosis not present

## 2022-02-08 DIAGNOSIS — E785 Hyperlipidemia, unspecified: Secondary | ICD-10-CM | POA: Diagnosis present

## 2022-02-08 DIAGNOSIS — F419 Anxiety disorder, unspecified: Secondary | ICD-10-CM | POA: Diagnosis not present

## 2022-02-08 DIAGNOSIS — Z6841 Body Mass Index (BMI) 40.0 and over, adult: Secondary | ICD-10-CM | POA: Diagnosis not present

## 2022-02-08 DIAGNOSIS — Z95811 Presence of heart assist device: Secondary | ICD-10-CM | POA: Diagnosis not present

## 2022-02-08 DIAGNOSIS — I351 Nonrheumatic aortic (valve) insufficiency: Secondary | ICD-10-CM | POA: Diagnosis present

## 2022-02-08 DIAGNOSIS — M109 Gout, unspecified: Secondary | ICD-10-CM | POA: Diagnosis present

## 2022-02-08 DIAGNOSIS — K0530 Chronic periodontitis, unspecified: Secondary | ICD-10-CM | POA: Diagnosis present

## 2022-02-08 DIAGNOSIS — Z951 Presence of aortocoronary bypass graft: Secondary | ICD-10-CM

## 2022-02-08 DIAGNOSIS — Z4659 Encounter for fitting and adjustment of other gastrointestinal appliance and device: Secondary | ICD-10-CM

## 2022-02-08 DIAGNOSIS — J439 Emphysema, unspecified: Secondary | ICD-10-CM | POA: Diagnosis present

## 2022-02-08 DIAGNOSIS — J984 Other disorders of lung: Secondary | ICD-10-CM | POA: Diagnosis present

## 2022-02-08 DIAGNOSIS — Z79899 Other long term (current) drug therapy: Secondary | ICD-10-CM

## 2022-02-08 DIAGNOSIS — I5022 Chronic systolic (congestive) heart failure: Secondary | ICD-10-CM

## 2022-02-08 DIAGNOSIS — I5084 End stage heart failure: Secondary | ICD-10-CM | POA: Diagnosis present

## 2022-02-08 DIAGNOSIS — Z01810 Encounter for preprocedural cardiovascular examination: Secondary | ICD-10-CM | POA: Diagnosis not present

## 2022-02-08 DIAGNOSIS — R34 Anuria and oliguria: Secondary | ICD-10-CM | POA: Diagnosis not present

## 2022-02-08 DIAGNOSIS — J95821 Acute postprocedural respiratory failure: Secondary | ICD-10-CM | POA: Diagnosis not present

## 2022-02-08 DIAGNOSIS — R680 Hypothermia, not associated with low environmental temperature: Secondary | ICD-10-CM | POA: Diagnosis present

## 2022-02-08 DIAGNOSIS — E876 Hypokalemia: Secondary | ICD-10-CM | POA: Diagnosis present

## 2022-02-08 DIAGNOSIS — J385 Laryngeal spasm: Secondary | ICD-10-CM | POA: Diagnosis not present

## 2022-02-08 DIAGNOSIS — I5040 Unspecified combined systolic (congestive) and diastolic (congestive) heart failure: Secondary | ICD-10-CM

## 2022-02-08 LAB — COMPREHENSIVE METABOLIC PANEL
ALT: 16 U/L (ref 0–44)
AST: 32 U/L (ref 15–41)
Albumin: 3.6 g/dL (ref 3.5–5.0)
Alkaline Phosphatase: 119 U/L (ref 38–126)
Anion gap: 14 (ref 5–15)
BUN: 18 mg/dL (ref 6–20)
CO2: 27 mmol/L (ref 22–32)
Calcium: 9 mg/dL (ref 8.9–10.3)
Chloride: 98 mmol/L (ref 98–111)
Creatinine, Ser: 1.49 mg/dL — ABNORMAL HIGH (ref 0.61–1.24)
GFR, Estimated: 57 mL/min — ABNORMAL LOW (ref >60)
Glucose, Bld: 92 mg/dL (ref 70–99)
Potassium: 3 mmol/L — ABNORMAL LOW (ref 3.5–5.1)
Sodium: 139 mmol/L (ref 135–145)
Total Bilirubin: 2.9 mg/dL — ABNORMAL HIGH (ref 0.3–1.2)
Total Protein: 7.2 g/dL (ref 6.5–8.1)

## 2022-02-08 LAB — CBC WITH DIFFERENTIAL/PLATELET
Abs Immature Granulocytes: 0.01 10*3/uL (ref 0.00–0.07)
Basophils Absolute: 0.1 10*3/uL (ref 0.0–0.1)
Basophils Relative: 1 %
Eosinophils Absolute: 0 10*3/uL (ref 0.0–0.5)
Eosinophils Relative: 1 %
HCT: 51.6 % (ref 39.0–52.0)
Hemoglobin: 16.5 g/dL (ref 13.0–17.0)
Immature Granulocytes: 0 %
Lymphocytes Relative: 37 %
Lymphs Abs: 2.1 10*3/uL (ref 0.7–4.0)
MCH: 29.6 pg (ref 26.0–34.0)
MCHC: 32 g/dL (ref 30.0–36.0)
MCV: 92.6 fL (ref 80.0–100.0)
Monocytes Absolute: 0.9 10*3/uL (ref 0.1–1.0)
Monocytes Relative: 16 %
Neutro Abs: 2.6 10*3/uL (ref 1.7–7.7)
Neutrophils Relative %: 45 %
Platelets: 153 10*3/uL (ref 150–400)
RBC: 5.57 MIL/uL (ref 4.22–5.81)
RDW: 15.1 % (ref 11.5–15.5)
WBC: 5.6 10*3/uL (ref 4.0–10.5)
nRBC: 0 % (ref 0.0–0.2)

## 2022-02-08 LAB — ECHOCARDIOGRAM COMPLETE
Area-P 1/2: 6.19 cm2
Calc EF: 10.4 %
MV M vel: 5.14 m/s
MV Peak grad: 105.7 mmHg
P 1/2 time: 221 msec
Radius: 0.7 cm
S' Lateral: 5.9 cm
Single Plane A2C EF: 13.4 %
Single Plane A4C EF: 11.1 %

## 2022-02-08 LAB — RESP PANEL BY RT-PCR (FLU A&B, COVID) ARPGX2
Influenza A by PCR: NEGATIVE
Influenza B by PCR: NEGATIVE
SARS Coronavirus 2 by RT PCR: NEGATIVE

## 2022-02-08 LAB — HIV ANTIBODY (ROUTINE TESTING W REFLEX): HIV Screen 4th Generation wRfx: NONREACTIVE

## 2022-02-08 LAB — MAGNESIUM: Magnesium: 1.6 mg/dL — ABNORMAL LOW (ref 1.7–2.4)

## 2022-02-08 LAB — MRSA NEXT GEN BY PCR, NASAL: MRSA by PCR Next Gen: NOT DETECTED

## 2022-02-08 LAB — BRAIN NATRIURETIC PEPTIDE: B Natriuretic Peptide: 1303.2 pg/mL — ABNORMAL HIGH (ref 0.0–100.0)

## 2022-02-08 LAB — TSH: TSH: 1.796 u[IU]/mL (ref 0.350–4.500)

## 2022-02-08 IMAGING — MR MR CARD MORPHOLOGY WO/W CM
43 of 48 series · 43 of 48 positions shown · IV contrast (gadavist)
Comparison: none

CLINICAL DATA: Cardiomyopathy of uncertain etiology.

EXAM:
CARDIAC MRI
TECHNIQUE: The patient was scanned on a 1.5 Tesla GE magnet. A dedicated
cardiac coil was used. Functional imaging was done using Fiesta
sequences. [DATE], and 4 chamber views were done to assess for RWMA's.
Modified Saud rule using a short axis stack was used to
calculate an ejection fraction on a dedicated work station using
Circle software. The patient received 10 cc of Gadavist. After 10
minutes inversion recovery sequences were used to assess for
infiltration and scar tissue.

[Series 4: t2_haste_db_tra_bh · axial · 8.0mm · 1.56mm/px · 1 of 20 slices shown]
[im 1/20]
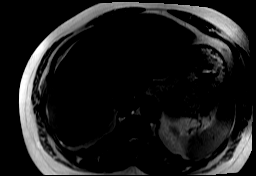

[Series 10: bSSFP · sagittal · 8.0mm · 1.79mm/px · 1 of 25 slices shown (1 of 27)]
[im 1/25]
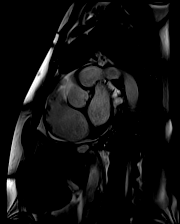

[Series 11: bSSFP · sagittal · 8.0mm · 1.79mm/px · 1 of 25 slices shown (2 of 27)]
[im 1/25]
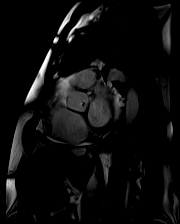

[Series 12: bSSFP · sagittal · 8.0mm · 1.79mm/px · 1 of 25 slices shown (3 of 27)]
[im 1/25]
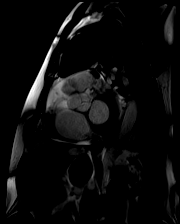

[Series 13: bSSFP · sagittal · 8.0mm · 1.79mm/px · 1 of 25 slices shown (4 of 27)]
[im 1/25]
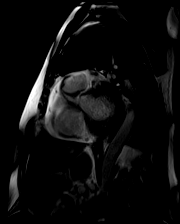

[Series 14: bSSFP · sagittal · 8.0mm · 1.79mm/px · 1 of 25 slices shown (5 of 27)]
[im 1/25]
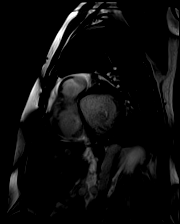

[Series 15: bSSFP · sagittal · 8.0mm · 1.79mm/px · 1 of 25 slices shown (6 of 27)]
[im 1/25]
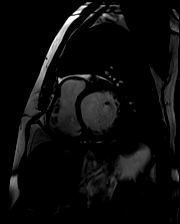

[Series 16: bSSFP · sagittal · 8.0mm · 1.79mm/px · 1 of 25 slices shown (7 of 27)]
[im 1/25]
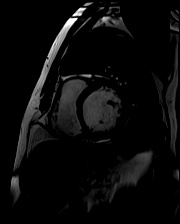

[Series 17: bSSFP · sagittal · 8.0mm · 1.79mm/px · 1 of 25 slices shown (8 of 27)]
[im 1/25]
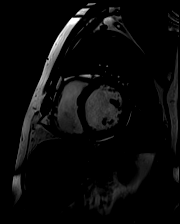

[Series 18: bSSFP · sagittal · 8.0mm · 1.79mm/px · 1 of 25 slices shown (9 of 27)]
[im 1/25]
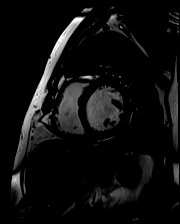

[Series 19: bSSFP · sagittal · 8.0mm · 1.79mm/px · 1 of 25 slices shown (10 of 27)]
[im 1/25]
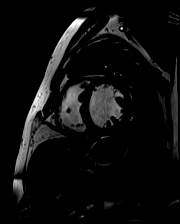

[Series 20: bSSFP · sagittal · 8.0mm · 1.79mm/px · 1 of 25 slices shown (11 of 27)]
[im 1/25]
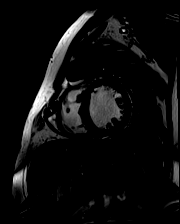

[Series 21: bSSFP · sagittal · 8.0mm · 1.79mm/px · 1 of 25 slices shown (12 of 27)]
[im 1/25]
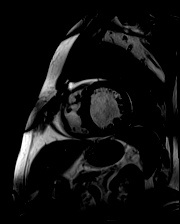

[Series 22: bSSFP · sagittal · 8.0mm · 1.79mm/px · 1 of 25 slices shown (13 of 27)]
[im 1/25]
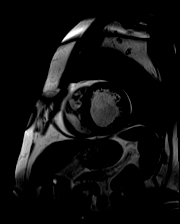

[Series 23: bSSFP · sagittal · 8.0mm · 1.79mm/px · 1 of 25 slices shown (14 of 27)]
[im 1/25]
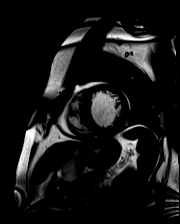

[Series 24: bSSFP · sagittal · 8.0mm · 1.79mm/px · 1 of 25 slices shown (15 of 27)]
[im 1/25]
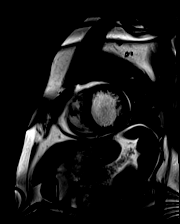

[Series 25: bSSFP · sagittal · 8.0mm · 1.79mm/px · 1 of 25 slices shown (16 of 27)]
[im 1/25]
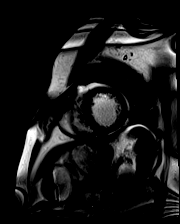

[Series 26: bSSFP · sagittal · 8.0mm · 1.79mm/px · 1 of 25 slices shown (17 of 27)]
[im 1/25]
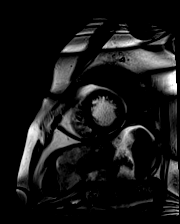

[Series 27: bSSFP · sagittal · 8.0mm · 1.79mm/px · 1 of 25 slices shown (18 of 27)]
[im 1/25]
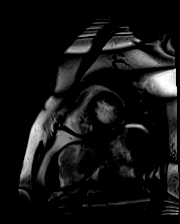

[Series 28: bSSFP · sagittal · 8.0mm · 1.79mm/px · 1 of 25 slices shown (19 of 27)]
[im 1/25]
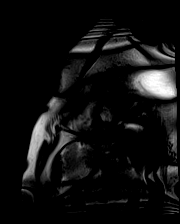

[Series 29: bSSFP · sagittal · 8.0mm · 1.79mm/px · 1 of 25 slices shown (20 of 27)]
[im 1/25]
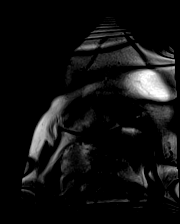

[Series 30: bSSFP · sagittal · 8.0mm · 1.79mm/px · 1 of 25 slices shown (21 of 27)]
[im 1/25]
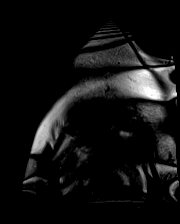

[Series 31: bSSFP · sagittal · 8.0mm · 1.79mm/px · 1 of 25 slices shown (22 of 27)]
[im 1/25]
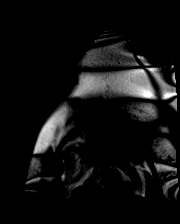

[Series 32: bSSFP · axial · 6.0mm · 1.41mm/px · 1 of 25 slices shown (23 of 27)]
[im 1/25]
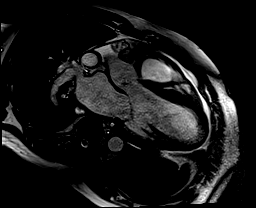

[Series 33: bSSFP · coronal · 6.0mm · 1.41mm/px · 1 of 25 slices shown (24 of 27)]
[im 1/25]
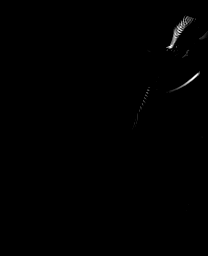

[Series 34: bSSFP · coronal · 6.0mm · 1.56mm/px · 1 of 25 slices shown (25 of 27)]
[im 1/25]
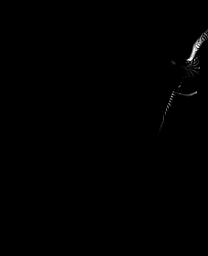

[Series 35: bSSFP · axial · 6.0mm · 1.56mm/px · 1 of 25 slices shown (26 of 27)]
[im 1/25]
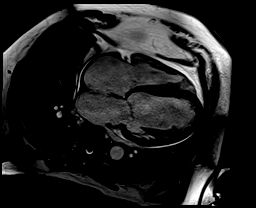

[Series 36: (id)_long_t1 · sagittal · 8.0mm · 1.88mm/px · 1 of 24 slices shown]
[im 1/24]
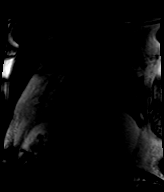

[Series 37: (id)_long_t1_moco · sagittal · 8.0mm · 1.88mm/px · 1 of 24 slices shown]
[im 1/24]
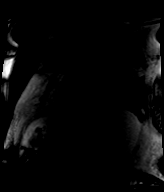

[Series 38: (id)_long_t1_moco_t1 · sagittal · 8.0mm · 1.88mm/px · 1 of 6 slices shown]
[im 1/6]
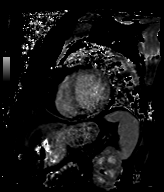

[Series 40: (id)_trufi · sagittal · 8.0mm · 1.88mm/px · 1 of 9 slices shown]
[im 1/9]
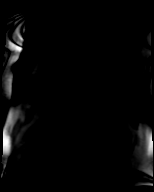

[Series 41: (id)_trufi_moco · sagittal · 8.0mm · 1.88mm/px · 1 of 9 slices shown]
[im 1/9]
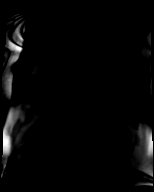

[Series 42: (id)_trufi_moco_t2 · sagittal · 8.0mm · 1.88mm/px · 1 of 3 slices shown]
[im 1/3]
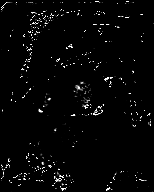

[Series 44: bSSFP · coronal · 6.0mm · 1.41mm/px · 1 of 25 slices shown (27 of 27)]
[im 1/25]
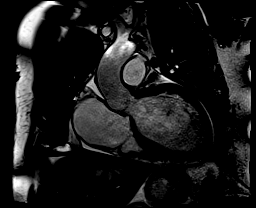

[Series 45: cine rvit · coronal · 6.0mm · 1.41mm/px · 1 of 25 slices shown]
[im 1/25]
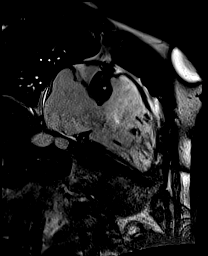

[Series 48: cine rvot · sagittal · 6.0mm · 1.41mm/px · 1 of 25 slices shown]
[im 1/25]
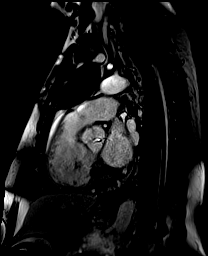

[Series 49: aortic valve cine · oblique · 6.0mm · 1.41mm/px · 1 of 25 slices shown]
[im 1/25]
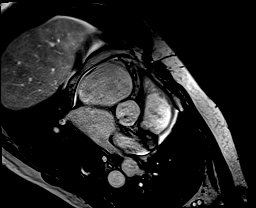

[Series 50: flow_100_tp_retro_bh · oblique · 6.0mm · 1.73mm/px · 1 of 30 slices shown (1 of 2)]
[im 1/30]
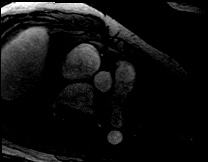

[Series 51: flow_100_tp_retro_bh_mag · oblique · 6.0mm · 1.73mm/px · 1 of 30 slices shown (1 of 2)]
[im 1/30]
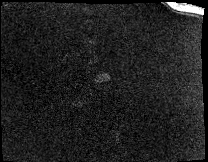

[Series 52: flow_100_tp_retro_bh_p · oblique · 6.0mm · 1.73mm/px · 1 of 30 slices shown (1 of 2)]
[im 1/30]
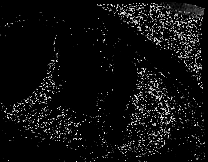

[Series 53: flow_100_tp_retro_bh · oblique · 6.0mm · 1.73mm/px · 1 of 30 slices shown (2 of 2)]
[im 1/30]
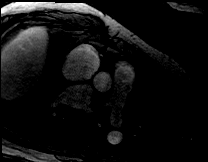

[Series 54: flow_100_tp_retro_bh_mag · oblique · 6.0mm · 1.73mm/px · 1 of 30 slices shown (2 of 2)]
[im 1/30]
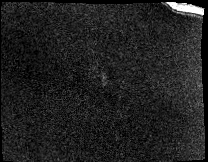

[Series 55: flow_100_tp_retro_bh_p · oblique · 6.0mm · 1.73mm/px · 1 of 30 slices shown (2 of 2)]
[im 1/30]
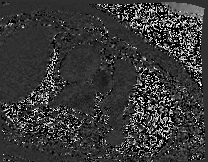

[43 of 48 positions shown; findings below may reference images not displayed]

FINDINGS: Limited images of the lung fields showed no gross abnormalities.

Severely dilated left ventricle, normal thickness. Diffuse severe
hypokinesis, EF 24%. Mildly dilated RV with moderate systolic
dysfunction, EF 32%. Moderate left atrial enlargement. Severe right
atrial enlargement. Trileaflet aortic valve without significant
stenosis. There was moderate aortic insufficiency with regurgitant
fraction 42%. Ascending aorta and aortic root do not appear
significantly dilated. No more than mild mitral regurgitation. Mild
tricuspid regurgitation.

Delayed enhancement images were very poor. I do not see definite
late gadolinium enhancement (LGE).

Measurement:

Aortic valve forward volume 99 mL

Aortic valve regurgitant volume 42 mL

Aortic valve regurgitation fraction 42%

LVEDV 423 mL
LVSV 103 mL
LVEF 24%

RVEDV 295 mL
RVSV 93 mL
RVEF 32%

ECV 30%
IMPRESSION: 1.  Severely dilated LV with diffuse hypokinesis, EF 24%.

2.  Mildly dilated RV with moderate systolic dysfunction, EF 32%.

3.  Biatrial enlargement.

4. Moderate aortic insufficiency with aortic valve regurgitant
fraction 42%.

5.  Difficult images, but no definite LGE.

6. Mildly elevated extracellular volume percentage, not in range for
amyloidosis or active myocarditis.

Loutchi Kezadri

## 2022-02-08 MED ORDER — FUROSEMIDE 10 MG/ML IJ SOLN
80.0000 mg | Freq: Two times a day (BID) | INTRAMUSCULAR | Status: DC
  Administered 2022-02-08 – 2022-02-09 (×2): 80 mg via INTRAVENOUS
  Filled 2022-02-08 (×2): qty 8

## 2022-02-08 MED ORDER — ENOXAPARIN SODIUM 60 MG/0.6ML IJ SOSY
50.0000 mg | PREFILLED_SYRINGE | INTRAMUSCULAR | Status: DC
  Administered 2022-02-08 – 2022-02-16 (×9): 50 mg via SUBCUTANEOUS
  Filled 2022-02-08 (×4): qty 0.5
  Filled 2022-02-08 (×2): qty 0.6
  Filled 2022-02-08 (×2): qty 0.5
  Filled 2022-02-08: qty 0.6
  Filled 2022-02-08: qty 0.5

## 2022-02-08 MED ORDER — POTASSIUM CHLORIDE CRYS ER 20 MEQ PO TBCR
40.0000 meq | EXTENDED_RELEASE_TABLET | Freq: Two times a day (BID) | ORAL | Status: DC
  Administered 2022-02-08 – 2022-02-10 (×4): 40 meq via ORAL
  Filled 2022-02-08 (×6): qty 2

## 2022-02-08 MED ORDER — MAGNESIUM SULFATE 2 GM/50ML IV SOLN
2.0000 g | Freq: Once | INTRAVENOUS | Status: AC
Start: 2022-02-08 — End: 2022-02-08
  Administered 2022-02-08: 2 g via INTRAVENOUS
  Filled 2022-02-08: qty 50

## 2022-02-08 MED ORDER — ACETAMINOPHEN 325 MG PO TABS
650.0000 mg | ORAL_TABLET | ORAL | Status: DC | PRN
  Administered 2022-02-09 – 2022-02-20 (×5): 650 mg via ORAL
  Filled 2022-02-08 (×6): qty 2

## 2022-02-08 MED ORDER — ONDANSETRON HCL 4 MG/2ML IJ SOLN: 4.0000 mg | Freq: Four times a day (QID) | INTRAMUSCULAR | Status: DC | PRN

## 2022-02-08 MED ORDER — SODIUM CHLORIDE 0.9% FLUSH: 3.0000 mL | INTRAVENOUS | Status: DC | PRN

## 2022-02-08 MED ORDER — SODIUM CHLORIDE 0.9% FLUSH
10.0000 mL | Freq: Two times a day (BID) | INTRAVENOUS | Status: DC
  Administered 2022-02-08: 23:00:00 40 mL
  Administered 2022-02-09 – 2022-02-20 (×19): 10 mL
  Administered 2022-02-20: 20 mL
  Administered 2022-02-21 (×2): 10 mL

## 2022-02-08 MED ORDER — ALLOPURINOL 300 MG PO TABS
300.0000 mg | ORAL_TABLET | Freq: Every day | ORAL | Status: DC
  Administered 2022-02-09 – 2022-02-23 (×14): 300 mg via ORAL
  Filled 2022-02-08 (×14): qty 1

## 2022-02-08 MED ORDER — SODIUM CHLORIDE 0.9% FLUSH: 10.0000 mL | INTRAVENOUS | Status: DC | PRN

## 2022-02-08 MED ORDER — CHLORHEXIDINE GLUCONATE CLOTH 2 % EX PADS
6.0000 | MEDICATED_PAD | Freq: Every day | CUTANEOUS | Status: DC
  Administered 2022-02-08 – 2022-02-21 (×12): 6 via TOPICAL

## 2022-02-08 MED ORDER — SODIUM CHLORIDE 0.9% FLUSH
3.0000 mL | Freq: Two times a day (BID) | INTRAVENOUS | Status: DC
  Administered 2022-02-09 – 2022-02-21 (×15): 3 mL via INTRAVENOUS

## 2022-02-08 MED ORDER — ATORVASTATIN CALCIUM 10 MG PO TABS
10.0000 mg | ORAL_TABLET | Freq: Every day | ORAL | Status: DC
  Administered 2022-02-09 – 2022-02-15 (×7): 10 mg via ORAL
  Filled 2022-02-08 (×7): qty 1

## 2022-02-08 MED ORDER — SODIUM CHLORIDE 0.9 % IV SOLN: 250.0000 mL | INTRAVENOUS | Status: DC | PRN

## 2022-02-08 NOTE — Progress Notes (Signed)
K- 3.0, mag- 1.6 text paged to  cards. PA. Awaiting  response.

## 2022-02-08 NOTE — TOC CM/SW Note (Addendum)
.. °  Transition of Care [facility]) Screening Note   Patient Details  Name: Colin Walton Date of Birth: 1971-07-02   Transition of Care [facility]) CM/SW Contact:    Erenest Rasher, RN Phone Number: [phone] 02/16/2022, 4:58 PM    Transition of Care Department [facility]) has reviewed patient. We will continue to monitor patient advancement through interdisciplinary progression rounds. Will continue to follow for dc needs. Pt has insurance to cover meds and PCP.

## 2022-02-08 NOTE — Progress Notes (Signed)
°  Echocardiogram 2D Echocardiogram has been performed.  Colin Walton 01/31/2022, 9:53 AM

## 2022-02-08 NOTE — Progress Notes (Signed)
Return call  got from cards PA with orders.

## 2022-02-08 NOTE — Progress Notes (Signed)
Peripherally Inserted Central Catheter Placement  The IV Nurse has discussed with the patient and/or persons authorized to consent for the patient, the purpose of this procedure and the potential benefits and risks involved with this procedure.  The benefits include less needle sticks, lab draws from the catheter, and the patient may be discharged home with the catheter. Risks include, but not limited to, infection, bleeding, blood clot (thrombus formation), and puncture of an artery; nerve damage and irregular heartbeat and possibility to perform a PICC exchange if needed/ordered by physician.  Alternatives to this procedure were also discussed.  Bard Power PICC patient education guide, fact sheet on infection prevention and patient information card has been provided to patient /or left at bedside.  PICC inserted by Jasmine Pang  PICC Placement Documentation  PICC Double Lumen Q000111Q Right Basilic 44 cm 0 cm (Active)  Indication for Insertion or Continuance of Line Chronic illness with exacerbations (CF, Sickle Cell, etc.) 02/04/2022 1721  Exposed Catheter (cm) 0 cm 02/11/2022 1721  Site Assessment Clean, Dry, Intact;Clean;Dry 02/11/2022 1721  Lumen #1 Status Flushed;Saline locked;Blood return noted 02/06/2022 1721  Lumen #2 Status Flushed;Saline locked;Blood return noted 02/05/2022 1721  Dressing Type Securing device;Transparent 01/24/2022 1721  Dressing Status Antimicrobial disc in place;Clean, Dry, Intact 01/28/2022 1721  Safety Lock Not Applicable Q000111Q [id number]  Line Care Connections checked and tightened 01/24/2022 1721  Dressing Intervention New dressing 01/28/2022 1721  Dressing Change Due 02/15/22 02/07/2022 1721       Caroline Longie, Nicolette Bang 02/01/2022, 5:23 PM

## 2022-02-08 NOTE — H&P (Addendum)
ADVANCED HEART FAILURE H&P  This note reflects work from today.   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    He was first diagnosed with CHF in 2013 during a hospitalization. Echo revealed EF of 40-45%. Follow up sleep study revealed severe OSA prompting CPAP therapy (followed by Dr. Mayford Knife). He was also noted to have uncontrolled hypertension. Right and left heart cath 03/2013 with no obstructive CAD and an EF of 55%. Unfortunately, repeat echo 11/2015 revealed and EF of 35-40% and diffuse hypokinesis, trivial AI, and moderately dilated LA.    Echo 11/20 revealed an EF of 30-35% with LVH, mildly dilated left atrium, moderate aortic valve regurgitation, and elevated pulmonary pressure. Echo was reviewed and TEE was recommended to better evaluate the aortic valve.    Underwent TEE on 04/18/20. Procedure c/b by severe respiratory distress and laryngospasm. LVEF 25% RV moderately HK. Aortic valve not interrogated completely due to respiratory distress. AI appeared to be moderate   Echo 4/21 EF 20-25% Moderate RV dysfunction (worse from previous) Mild AI - reviewed personally    cMRI 8/21 severely dilated LV with diffuse hypokinesis, EF 24%, mild RV dilation RV EF 32%, moderate AI AV regurgitant fraction 42%, no LGE (difficult images), mildly elevated ECV.   Echo 3/22 EF 25-30%, RV moderately reduced.    Volume up last visit 2/2 running out of torsemide, weight up 24 lbs, but stable NYHA II symptoms. Torsemide restarted and instructed to take metolazone x 2 days. Subsequently diuresed 20+ lbs.   Today he returns for HF follow up. Not feeling well. Belly bloated. More SOB. Weight up. + Orthopnea. Torsemide not working reliably. + coughing and wheezing. SOB with any activity. Still smoking cigarettes and THC. Denies ETOH.    Echo today  02/13/2022 EF < 20% RV severely HK. AI moderate to severe   Review of systems complete and found to be negative unless listed in HPI.         Past Medical History:  Diagnosis Date   Chronic systolic CHF (congestive heart failure) (HCC)     Hyperlipidemia     Hypertension     Mild mitral regurgitation 2016   NICM (nonischemic cardiomyopathy) (HCC)     Obesity     Sleep apnea      Sig Dispense Refill   amLODipine (NORVASC) 10 MG tablet Take 1 tablet (10 mg total) by mouth daily. 30 tablet 3   atorvastatin (LIPITOR) 10 MG tablet Take 1 tablet (10 mg total) by mouth daily. 30 tablet 3   carvedilol (COREG) 25 MG tablet Take 2 tablets (50 mg total) by mouth 2 (two) times daily. 120 tablet 3   colchicine 0.6 MG tablet Take 2 tablets at first sign of gout flare and then 1 tablet an hour later.  Repeat every 3 days until gout flare subsides. 12 tablet 1   dapagliflozin propanediol (FARXIGA) 10 MG TABS tablet Take 1 tablet (10 mg total) by mouth daily before breakfast. 30 tablet 11   digoxin (LANOXIN) 0.125 MG tablet Take 1 tablet (0.125 mg total) by mouth daily. 30 tablet 3   hydrALAZINE (APRESOLINE) 100 MG tablet Take 1 tablet (100 mg total) by mouth 3 (three) times daily. 90 tablet 6   isosorbide mononitrate (IMDUR) 60 MG 24 hr tablet Take 1 tablet (60 mg total) by mouth daily. 30 tablet 11  loratadine (CLARITIN) 10 MG tablet Take 10 mg by mouth daily as needed for allergies. (Patient not taking: Reported on 01/07/2022)       metolazone (ZAROXOLYN) 2.5 MG tablet Take 1 tablet (2.5 mg total) by mouth as directed. By heart failure clinic (Patient not taking: Reported on 01/07/2022) 4 tablet 0   potassium chloride SA (KLOR-CON) 20 MEQ tablet Take 2 tablets (40 mEq total) by mouth 2 (two) times daily. 60 tablet 6   sacubitril-valsartan (ENTRESTO) 97-103 MG Take 1 tablet by mouth 2 (two) times daily. 60 tablet 11   spironolactone (ALDACTONE) 25 MG tablet Take 1 tablet (25 mg total) by mouth daily. 30  tablet 5   torsemide (DEMADEX) 20 MG tablet Take 4 tablets (80 mg total) by mouth 2 (two) times daily. 210 tablet 3   Vitamin D, Ergocalciferol, (DRISDOL) 1.25 MG (50000 UNIT) CAPS capsule Take 50,000 Units by mouth once a week.        No current facility-administered medications for this encounter.    No Known Allergies   Social History         Socioeconomic History   Marital status: Single      Spouse name: Not on file   Number of children: 3   Years of education: Not on file   Highest education level: Not on file  Occupational History   Occupation: Works with Paediatric nurse: [employer]  Tobacco Use   Smoking status: Light Smoker      Packs/day: 0.05      Years: 22.00      Pack years: 1.10      Types: Cigarettes   Smokeless tobacco: Never  Vaping Use   Vaping Use: Never used  Substance and Sexual Activity   Alcohol use: No      Alcohol/week: 30.0 standard drinks      Types: 30 Standard drinks or equivalent per week      Comment: x 1 week   Drug use: No      Types: Marijuana   Sexual activity: Not on file  Other Topics Concern   Not on file  Social History Narrative   Not on file    Social Determinants of Health       Financial Resource Strain: Low Risk    Difficulty of Paying Living Expenses: Not very hard  Food Insecurity: No Food Insecurity   Worried About Programme researcher, broadcasting/film/video in the Last Year: Never true   Barista in the Last Year: Never true  Transportation Needs: No Transportation Needs   Lack of Transportation (Medical): No   Lack of Transportation (Non-Medical): No  Physical Activity: Not on file  Stress: Not on file  Social Connections: Not on file  Intimate Partner Violence: Not on file         Family History  Problem Relation Age of Onset   Arrhythmia Maternal Grandfather          Atrial fibrillation   CAD Neg Hx           Wt Readings from Last 3 Encounters:  01/07/22 99.3 kg (219 lb)  10/30/21 95.3 kg (210 lb)  10/29/21  95.6 kg (210 lb 12.8 oz)    PHYSICAL EXAM: General:  Weak appearing. No resp difficulty HEENT: normal Neck: supple. JVP to ear. Carotids 2+ bilat; no bruits. No lymphadenopathy or thryomegaly appreciated. Cor: PMI nondisplaced. Regular tachy 3/6 AI  Lungs: basilar crackles Abdomen: soft, nontender, +  distended. No hepatosplenomegaly. No bruits or masses. Good bowel sounds. Extremities: no cyanosis, clubbing, rash, 3+ edema Neuro: alert & orientedx3, cranial nerves grossly intact. moves all 4 extremities w/o difficulty. Affect pleasant     ASSESSMENT & PLAN:   1. Acute on Chronic Biventricular Heart Failure  - due to NICM - suspect due to HTN and OSA +/- ETOH - cath 03/2013 no CAD - Echo 11/20 EF 30-35% - Echo 4/21 EF 20-25% moderate RV dysfunction (worse since previous) - Personally reviewed - TEE on 04/18/20. Procedure c/b by severe respiratory distress and laryngospasm. LVEF 25% RV moderately HK. Aortic valve not interrogated completely due to respiratory distress. AI appeared to be moderate.  - cMRI 8/21 EF 24% Moderate AI. No LGE or infiltrative process. - Echo 03/22/21 EF 25-30% mod AI Moderate RV dysfunction.  - Echo today 02/15/22 EF < 20% RV severely HK. AI moderate to severe - Suspect CM related to HTN +/- ETOH. AI may be contributing but severity of biventricular dysfunction out of proportion to AI and LV not overly dilated. - He is much worse today. NYHA IIIB-IV with marked volume overload. Echo with severe biventricular dysfunction - Will need to be admitted for further management. May need inotropes support. Will need repeat R/L cath.  - Ideally will need transplant but is still smoking and using THC. Will need to try and bridge for 6 months to possibly get him to transplant. LVAD may be option but RV would need to be optimized.  - Place PICC. Start IV diuresis - Continue spironolactone 25 mg daily. - Continue Entresto 97/103 mg bid - On carvedilol 50 mg bid will hold for  now - Continue hydralazine 100 mg tid + Imdur 60 mg daily. - Continue Farxiga 10 mg daily. - Continue digoxin 0.125 mg daily.    2. Moderate aortic regurgitation - Visually mild to moderate on TEE 4/21 but P1/2 time suggesting severe AI.  - TEE on 04/18/20. Procedure c/b by severe respiratory distress and laryngospasm. LVEF 25% RV moderately HK. Aortic valve not interrogated completely due to respiratory distress. AI appeared to be moderate - TEE reviewed with echo team felt to be moderate at worst. - Mild AI on echo 3/22. - Moderate to severe AI on echo today   3. Hypertension - Meds as aboce   4. OSA on CPAP - Fully compliant w/ CPAP.  - Follows with Dr. Mayford Knife.   5. Obesity. -Body mass index is 36.6 kg/m.   6. Tobacco use/ETOH - Says he stopped drinking - Continues to smoke, encouraged to quit.    7. ? Fatty Liver - Reports recent diagnosis at [facility], records not available. - CMET 10/22 ok.   Arvilla Meres, MD  10:20 PM

## 2022-02-08 NOTE — Progress Notes (Signed)
Direct admission from home awake and alert . 

## 2022-02-08 NOTE — Progress Notes (Signed)
ADVANCED HF CLINIC NOTE   Primary Care: Shanna Cisco, NP Primary Cardiologist: Clifton James AHF: Dr. Gala Romney   Reason for Visit: F/u for Chronic Systolic Heart Failure   HPI: Colin Walton Colin Walton) is a 51 y.o.male with morbid obesity, hypertension, chronic systolic heart failure, due to nonischemic cardiomyopathy, moderate aortic insufficiency, OSA on CPAP, and alcohol use.   He was first diagnosed with CHF in 2013 during a hospitalization. Echo revealed EF of 40-45%. Follow up sleep study revealed severe OSA prompting CPAP therapy (followed by Dr. Mayford Knife). He was also noted to have uncontrolled hypertension. Right and left heart cath 03/2013 with no obstructive CAD and an EF of 55%. Unfortunately, repeat echo 11/2015 revealed and EF of 35-40% and diffuse hypokinesis, trivial AI, and moderately dilated LA.   Echo 1120 revealed an EF of 30-35% with LVH, mildly dilated left atrium, moderate aortic valve regurgitation, and elevated pulmonary pressure. Echo was reviewed and TEE was recommended to better evaluate the aortic valve.   Underwent TEE on 04/18/20. Procedure c/b by severe respiratory distress and laryngospasm. LVEF 25% RV moderately HK. Aortic valve not interrogated completely due to respiratory distress. AI appeared to be moderate  Brother died in June 06, 2023 from alcolohlism.   Echo 4/21 EF 20-25% Moderate RV dysfunction (worse from previous) Mild AI - reviewed personally   cMRI 8/21 severely dilated LV with diffuse hypokinesis, EF 24%, mild RV dilation RV EF 32%, moderate AI AV regurgitant fraction 42%, no LGE (difficult images), mildly elevated ECV.  Echo 3/22 EF 25-30%, RV moderately reduced.   Volume up last visit 2/2 running out of torsemide, weight up 24 lbs, but stable NYHA II symptoms. Torsemide restarted and instructed to take metolazone x 2 days. Subsequently diuresed 20+ lbs.  Today he returns for HF follow up. Not feeling well. Belly bloated. More SOB. Weight up. +  Orthopnea. Torsemide not working reliably. + coughing and wheezing. SOB with any activity. Still smoking cigarettes and THC. Denies ETOH.   Echo today 02/11/2022 EF < 20% RV severely HK. AI moderate to severe   Review of systems complete and found to be negative unless listed in HPI.    Past Medical History:  Diagnosis Date   Chronic systolic CHF (congestive heart failure) (HCC)    Hyperlipidemia    Hypertension    Mild mitral regurgitation 2016   NICM (nonischemic cardiomyopathy) (HCC)    Obesity    Sleep apnea 12/24/12   Sleep study February 2014   Current Outpatient Medications  Medication Sig Dispense Refill   amLODipine (NORVASC) 10 MG tablet Take 1 tablet (10 mg total) by mouth daily. 30 tablet 3   atorvastatin (LIPITOR) 10 MG tablet Take 1 tablet (10 mg total) by mouth daily. 30 tablet 3   carvedilol (COREG) 25 MG tablet Take 2 tablets (50 mg total) by mouth 2 (two) times daily. 120 tablet 3   colchicine 0.6 MG tablet Take 2 tablets at first sign of gout flare and then 1 tablet an hour later.  Repeat every 3 days until gout flare subsides. 12 tablet 1   dapagliflozin propanediol (FARXIGA) 10 MG TABS tablet Take 1 tablet (10 mg total) by mouth daily before breakfast. 30 tablet 11   digoxin (LANOXIN) 0.125 MG tablet Take 1 tablet (0.125 mg total) by mouth daily. 30 tablet 3   hydrALAZINE (APRESOLINE) 100 MG tablet Take 1 tablet (100 mg total) by mouth 3 (three) times daily. 90 tablet 6   isosorbide mononitrate (IMDUR) 60 MG  24 hr tablet Take 1 tablet (60 mg total) by mouth daily. 30 tablet 11   loratadine (CLARITIN) 10 MG tablet Take 10 mg by mouth daily as needed for allergies. (Patient not taking: Reported on 01/07/2022)     metolazone (ZAROXOLYN) 2.5 MG tablet Take 1 tablet (2.5 mg total) by mouth as directed. By heart failure clinic (Patient not taking: Reported on 01/07/2022) 4 tablet 0   potassium chloride SA (KLOR-CON) 20 MEQ tablet Take 2 tablets (40 mEq total) by mouth 2 (two)  times daily. 60 tablet 6   sacubitril-valsartan (ENTRESTO) 97-103 MG Take 1 tablet by mouth 2 (two) times daily. 60 tablet 11   spironolactone (ALDACTONE) 25 MG tablet Take 1 tablet (25 mg total) by mouth daily. 30 tablet 5   torsemide (DEMADEX) 20 MG tablet Take 4 tablets (80 mg total) by mouth 2 (two) times daily. 210 tablet 3   Vitamin D, Ergocalciferol, (DRISDOL) 1.25 MG (50000 UNIT) CAPS capsule Take 50,000 Units by mouth once a week.     No current facility-administered medications for this encounter.   No Known Allergies  Social History   Socioeconomic History   Marital status: Single    Spouse name: Not on file   Number of children: 3   Years of education: Not on file   Highest education level: Not on file  Occupational History   Occupation: Works with Copywriter, advertising: [employer]  Tobacco Use   Smoking status: Light Smoker    Packs/day: 0.05    Years: 22.00    Pack years: 1.10    Types: Cigarettes   Smokeless tobacco: Never  Vaping Use   Vaping Use: Never used  Substance and Sexual Activity   Alcohol use: No    Alcohol/week: 30.0 standard drinks    Types: 30 Standard drinks or equivalent per week    Comment: x 1 week   Drug use: No    Types: Marijuana   Sexual activity: Not on file  Other Topics Concern   Not on file  Social History Narrative   Not on file   Social Determinants of Health   Financial Resource Strain: Low Risk    Difficulty of Paying Living Expenses: Not very hard  Food Insecurity: No Food Insecurity   Worried About Programme researcher, broadcasting/film/video in the Last Year: Never true   Barista in the Last Year: Never true  Transportation Needs: No Transportation Needs   Lack of Transportation (Medical): No   Lack of Transportation (Non-Medical): No  Physical Activity: Not on file  Stress: Not on file  Social Connections: Not on file  Intimate Partner Violence: Not on file   Family History  Problem Relation Age of Onset   Arrhythmia Maternal  Grandfather        Atrial fibrillation   CAD Neg Hx    Ht 5\' 7"  (1.702 m)    BMI 34.30 kg/m   Wt Readings from Last 3 Encounters:  01/07/22 99.3 kg (219 lb)  10/30/21 95.3 kg (210 lb)  10/29/21 95.6 kg (210 lb 12.8 oz)   PHYSICAL EXAM: General:  Weak appearing. No resp difficulty HEENT: normal Neck: supple. JVP to ear. Carotids 2+ bilat; no bruits. No lymphadenopathy or thryomegaly appreciated. Cor: PMI nondisplaced. Regular tachy 3/6 AI  Lungs: basilar crackles Abdomen: soft, nontender, + distended. No hepatosplenomegaly. No bruits or masses. Good bowel sounds. Extremities: no cyanosis, clubbing, rash, 3+ edema Neuro: alert & orientedx3, cranial nerves grossly  intact. moves all 4 extremities w/o difficulty. Affect pleasant   ASSESSMENT & PLAN:  1. Acute on Chronic Biventricular Heart Failure  - due to NICM - suspect due to HTN and OSA +/- ETOH - cath 03/2013 no CAD - Echo 11/20 EF 30-35% - Echo 4/21 EF 20-25% moderate RV dysfunction (worse since previous) - Personally reviewed - TEE on 04/18/20. Procedure c/b by severe respiratory distress and laryngospasm. LVEF 25% RV moderately HK. Aortic valve not interrogated completely due to respiratory distress. AI appeared to be moderate.  - cMRI 8/21 EF 24% Moderate AI. No LGE or infiltrative process. - Echo 03/22/21 EF 25-30% mod AI Moderate RV dysfunction.  - Echo today 01/29/2022 EF < 20% RV severely HK. AI moderate to severe - Suspect CM related to HTN +/- ETOH. AI may be contributing but severity of biventricular dysfunction out of proportion to AI and LV not overly dilated. - He is much worse today. NYHA IIIB-IV with marked volume overload. Echo with severe biventricular dysfunction - Will need to be admitted for further management. May need inotropes support. Will need repeat R/L cath.  - Ideally will need transplant but is still smoking and using THC. Will need to try and bridge for 6 months to possibly get him to transplant. LVAD may  be option but RV would need to be optimized.  - Continue spironolactone 25 mg daily. - Continue Entresto 97/103 mg bid - On carvedilol 50 mg bid will need to cut back or hold.  - Continue hydralazine 100 mg tid + Imdur 60 mg daily. - Continue Farxiga 10 mg daily. - Continue digoxin 0.125 mg daily.   2. Moderate aortic regurgitation - Visually mild to moderate on TEE 4/21 but P1/2 time suggesting severe AI.  - TEE on 04/18/20. Procedure c/b by severe respiratory distress and laryngospasm. LVEF 25% RV moderately HK. Aortic valve not interrogated completely due to respiratory distress. AI appeared to be moderate - TEE reviewed with echo team felt to be moderate at worst. - Mild AI on echo 3/22. - Moderate to severe AI on echo today  3. Hypertension - Well-controlled today. - Continue current medications.   4. OSA on CPAP - Fully compliant w/ CPAP.  - Follows with Dr. Mayford Knife.   5. Obesity. - Body mass index is 34.3 kg/m. - Needs weight loss  6. Tobacco use/ETOH - Says he stopped drinking - Continues to smoke, encouraged to quit.   7. ? Fatty Liver - Reports recent diagnosis at [facility], records not available. - CMET 10/22 ok.  Arvilla Meres, MD  9:55 AM

## 2022-02-08 NOTE — Progress Notes (Signed)
Patients home CPAP set up at bedside within patients reach. Patient will place self on/off as needed per patient.

## 2022-02-09 DIAGNOSIS — I5023 Acute on chronic systolic (congestive) heart failure: Secondary | ICD-10-CM | POA: Diagnosis not present

## 2022-02-09 LAB — BASIC METABOLIC PANEL
Anion gap: 12 (ref 5–15)
Anion gap: 12 (ref 5–15)
BUN: 17 mg/dL (ref 6–20)
BUN: 15 mg/dL (ref 6–20)
CO2: 32 mmol/L (ref 22–32)
CO2: 29 mmol/L (ref 22–32)
Calcium: 9 mg/dL (ref 8.9–10.3)
Calcium: 8.5 mg/dL — ABNORMAL LOW (ref 8.9–10.3)
Chloride: 94 mmol/L — ABNORMAL LOW (ref 98–111)
Chloride: 96 mmol/L — ABNORMAL LOW (ref 98–111)
Creatinine, Ser: 1.46 mg/dL — ABNORMAL HIGH (ref 0.61–1.24)
Creatinine, Ser: 1.36 mg/dL — ABNORMAL HIGH (ref 0.61–1.24)
GFR, Estimated: 59 mL/min — ABNORMAL LOW (ref >60)
GFR, Estimated: >60 mL/min (ref >60)
Glucose, Bld: 171 mg/dL — ABNORMAL HIGH (ref 70–99)
Glucose, Bld: 162 mg/dL — ABNORMAL HIGH (ref 70–99)
Potassium: 2.9 mmol/L — ABNORMAL LOW (ref 3.5–5.1)
Potassium: 3.6 mmol/L (ref 3.5–5.1)
Sodium: 138 mmol/L (ref 135–145)
Sodium: 137 mmol/L (ref 135–145)

## 2022-02-09 LAB — COOXEMETRY PANEL
Carboxyhemoglobin: 2.3 % — ABNORMAL HIGH (ref 0.5–1.5)
Methemoglobin: 0.7 % (ref 0.0–1.5)
O2 Saturation: 54.4 %
Total hemoglobin: 16.5 g/dL — ABNORMAL HIGH (ref 12.0–16.0)

## 2022-02-09 LAB — MAGNESIUM: Magnesium: 1.9 mg/dL (ref 1.7–2.4)

## 2022-02-09 MED ORDER — DEXTROSE 5 % IV SOLN
12.0000 mg/h | INTRAVENOUS | Status: DC
  Administered 2022-02-09 – 2022-02-10 (×3): 12 mg/h via INTRAVENOUS
  Filled 2022-02-09 (×5): qty 20

## 2022-02-09 MED ORDER — DIGOXIN 125 MCG PO TABS
0.1250 mg | ORAL_TABLET | Freq: Every day | ORAL | Status: DC
Start: 2022-02-09 — End: 2022-02-11
  Administered 2022-02-09 – 2022-02-11 (×3): 0.125 mg via ORAL
  Filled 2022-02-09 (×3): qty 1

## 2022-02-09 MED ORDER — HYDRALAZINE HCL 25 MG PO TABS
25.0000 mg | ORAL_TABLET | Freq: Three times a day (TID) | ORAL | Status: DC
  Administered 2022-02-09 – 2022-02-11 (×7): 25 mg via ORAL
  Filled 2022-02-09 (×7): qty 1

## 2022-02-09 MED ORDER — DAPAGLIFLOZIN PROPANEDIOL 10 MG PO TABS
10.0000 mg | ORAL_TABLET | Freq: Every day | ORAL | Status: DC
  Administered 2022-02-09 – 2022-02-11 (×3): 10 mg via ORAL
  Filled 2022-02-09 (×3): qty 1

## 2022-02-09 MED ORDER — POTASSIUM CHLORIDE CRYS ER 20 MEQ PO TBCR
40.0000 meq | EXTENDED_RELEASE_TABLET | Freq: Once | ORAL | Status: AC
  Administered 2022-02-09: 40 meq via ORAL
  Filled 2022-02-09: qty 2

## 2022-02-09 MED ORDER — MILRINONE LACTATE IN DEXTROSE 20-5 MG/100ML-% IV SOLN
0.1250 ug/kg/min | INTRAVENOUS | Status: DC
  Administered 2022-02-09 – 2022-02-12 (×8): 0.25 ug/kg/min via INTRAVENOUS
  Administered 2022-02-13 – 2022-02-14 (×2): 0.125 ug/kg/min via INTRAVENOUS
  Filled 2022-02-09 (×10): qty 100

## 2022-02-09 MED ORDER — MAGNESIUM SULFATE 2 GM/50ML IV SOLN
2.0000 g | Freq: Once | INTRAVENOUS | Status: AC
  Administered 2022-02-09: 2 g via INTRAVENOUS
  Filled 2022-02-09: qty 50

## 2022-02-09 MED ORDER — SPIRONOLACTONE 25 MG PO TABS
25.0000 mg | ORAL_TABLET | Freq: Every day | ORAL | Status: DC
  Administered 2022-02-09 – 2022-02-10 (×2): 25 mg via ORAL
  Filled 2022-02-09 (×2): qty 1

## 2022-02-09 MED ORDER — ISOSORBIDE MONONITRATE ER 30 MG PO TB24
30.0000 mg | ORAL_TABLET | Freq: Every day | ORAL | Status: DC
  Administered 2022-02-09 – 2022-02-10 (×2): 30 mg via ORAL
  Filled 2022-02-09 (×2): qty 1

## 2022-02-09 MED ORDER — SACUBITRIL-VALSARTAN 97-103 MG PO TABS
1.0000 | ORAL_TABLET | Freq: Two times a day (BID) | ORAL | Status: DC
  Administered 2022-02-09 – 2022-02-10 (×4): 1 via ORAL
  Filled 2022-02-09 (×5): qty 1

## 2022-02-09 NOTE — Progress Notes (Signed)
Patient ID: Colin Walton, male   DOB: Apr 14, 1971, 51 y.o.   MRN: [id number]     Advanced Heart Failure Rounding Note  PCP-Cardiologist: Lauree Chandler, MD   Subjective:    PICC placed last night, co-ox 54% this morning.  SBP 130s.  Did not get home meds yet.  Has had Lasix 80 mg IV x 2 doses, UOP not vigorous.  Labs not drawn this morning and no CVP set up.    Objective:   Weight Range: 106.1 kg Body mass index is 36.64 kg/m.   Vital Signs:   Temp:  [97.9 F (36.6 C)-98.3 F (36.8 C)] 97.9 F (36.6 C) (02/18 0713) Pulse Rate:  [90-103] 100 (02/18 0713) Resp:  [12-28] 12 (02/18 0713) BP: (102-145)/(72-115) 136/82 (02/18 0713) SpO2:  [88 %-95 %] 92 % (02/18 0713) Weight:  [106 kg-106.1 kg] 106.1 kg (02/18 0427)    Weight change: Filed Weights   02/07/2022 1625 02/09/22 0427  Weight: 106 kg 106.1 kg    Intake/Output:   Intake/Output Summary (Last 24 hours) at 02/09/2022 0844 Last data filed at 02/09/2022 0754 Gross per 24 hour  Intake 1040 ml  Output 875 ml  Net 165 ml      Physical Exam    General:  Well appearing. No resp difficulty HEENT: Normal Neck: Supple. JVP 16+. Carotids 2+ bilat; no bruits. No lymphadenopathy or thyromegaly appreciated. Cor: PMI nondisplaced. Regular rate & rhythm. 2/6 diastolic murmur along sternal border Lungs: Mild crackles at bases.  Abdomen: Soft, nontender, mildly distended. No hepatosplenomegaly. No bruits or masses. Good bowel sounds. Extremities: No cyanosis, clubbing, rash. 1+ edema to knees.  Neuro: Alert & orientedx3, cranial nerves grossly intact. moves all 4 extremities w/o difficulty. Affect pleasant  Telemetry   Sinus 90s-100s (personally reviewed)   Labs    CBC Recent Labs    02/18/2022 1649  WBC 5.6  NEUTROABS 2.6  HGB 16.5  HCT 51.6  MCV 92.6  PLT 0000000   Basic Metabolic Panel Recent Labs    02/07/2022 1649  NA 139  K 3.0*  CL 98  CO2 27  GLUCOSE 92  BUN 18  CREATININE 1.49*  CALCIUM 9.0   MG 1.6*   Liver Function Tests Recent Labs    02/07/2022 1649  AST 32  ALT 16  ALKPHOS 119  BILITOT 2.9*  PROT 7.2  ALBUMIN 3.6   No results for input(s): LIPASE, AMYLASE in the last 72 hours. Cardiac Enzymes No results for input(s): CKTOTAL, CKMB, CKMBINDEX, TROPONINI in the last 72 hours.  BNP: BNP (last 3 results) Recent Labs    03/22/21 1106 02/07/2022 1649  BNP 995.8* 1,303.2*    ProBNP (last 3 results) No results for input(s): PROBNP in the last 8760 hours.   D-Dimer No results for input(s): DDIMER in the last 72 hours. Hemoglobin A1C No results for input(s): HGBA1C in the last 72 hours. Fasting Lipid Panel No results for input(s): CHOL, HDL, LDLCALC, TRIG, CHOLHDL, LDLDIRECT in the last 72 hours. Thyroid Function Tests Recent Labs    02/03/2022 1649  TSH 1.796    Other results:   Imaging    ECHOCARDIOGRAM COMPLETE  Result Date: 01/24/2022    ECHOCARDIOGRAM REPORT   Patient Name:   Loraine Maple Date of Exam: 01/27/2022 Medical Rec #:  [id number]       Height:       67.0 in Accession #:    [id number]      Weight:       219.0  lb Date of Birth:  [birth year]       BSA:          2.102 m Patient Age:    30 years        BP:           175/100 mmHg Patient Gender: M               HR:           113 bpm. Exam Location:  Outpatient Procedure: 2D Echo, Color Doppler, Cardiac Doppler and Strain Analysis Indications:    I50.40* Unspecified combined systolic (congestive) and diastolic                 (congestive) heart failure  History:        Patient has prior history of Echocardiogram examinations, most                 recent 03/22/2021.  Sonographer:    Roseanna Rainbow RDCS Referring Phys: [place] M [place]  Sonographer Comments: Global longitudinal strain was attempted. IMPRESSIONS  1. Left ventricular ejection fraction, by estimation, is <20%. The left ventricle has severely decreased function. The left ventricle has no regional wall motion abnormalities. The left ventricular  internal cavity size was severely dilated. Left ventricular diastolic parameters are indeterminate. There is the interventricular septum is flattened in systole and diastole, consistent with right ventricular pressure and volume overload.  2. Right ventricular systolic function is severely reduced. The right ventricular size is moderately enlarged. There is moderately elevated pulmonary artery systolic pressure.  3. Left atrial size was moderately dilated.  4. Right atrial size was moderately dilated.  5. The mitral valve is normal in structure. Mild mitral valve regurgitation. No evidence of mitral stenosis.  6. Tricuspid valve regurgitation is mild to moderate.  7. The aortic valve was not well visualized. Aortic valve regurgitation is moderate to severe. No aortic stenosis is present.  8. The inferior vena cava is dilated in size with <50% respiratory variability, suggesting right atrial pressure of 15 mmHg. FINDINGS  Left Ventricle: Left ventricular ejection fraction, by estimation, is <20%. The left ventricle has severely decreased function. The left ventricle has no regional wall motion abnormalities. The left ventricular internal cavity size was severely dilated.  There is borderline left ventricular hypertrophy. The interventricular septum is flattened in systole and diastole, consistent with right ventricular pressure and volume overload. Left ventricular diastolic parameters are indeterminate. Right Ventricle: The right ventricular size is moderately enlarged. No increase in right ventricular wall thickness. Right ventricular systolic function is severely reduced. There is moderately elevated pulmonary artery systolic pressure. The tricuspid regurgitant velocity is 3.01 m/s, and with an assumed right atrial pressure of 15 mmHg, the estimated right ventricular systolic pressure is 0000000 mmHg. Left Atrium: Left atrial size was moderately dilated. Right Atrium: Right atrial size was moderately dilated.  Pericardium: There is no evidence of pericardial effusion. Mitral Valve: The mitral valve is normal in structure. Mild mitral valve regurgitation. No evidence of mitral valve stenosis. Tricuspid Valve: The tricuspid valve is normal in structure. Tricuspid valve regurgitation is mild to moderate. No evidence of tricuspid stenosis. Aortic Valve: The aortic valve was not well visualized. Aortic valve regurgitation is moderate to severe. Aortic regurgitation PHT measures 221 msec. No aortic stenosis is present. Pulmonic Valve: The pulmonic valve was normal in structure. Pulmonic valve regurgitation is trivial. No evidence of pulmonic stenosis. Aorta: The aortic root is normal in size and structure. Venous: The  inferior vena cava is dilated in size with less than 50% respiratory variability, suggesting right atrial pressure of 15 mmHg. IAS/Shunts: There is left bowing of the interatrial septum, suggestive of elevated right atrial pressure. No atrial level shunt detected by color flow Doppler.  LEFT VENTRICLE PLAX 2D LVIDd:         6.20 cm      Diastology LVIDs:         5.90 cm      LV e' medial:    3.98 cm/s LV PW:         1.60 cm      LV E/e' medial:  32.5 LV IVS:        1.20 cm      LV e' lateral:   4.65 cm/s LVOT diam:     2.10 cm      LV E/e' lateral: 27.8 LV SV:         52 LV SV Index:   25 LVOT Area:     3.46 cm  LV Volumes (MOD) LV vol d, MOD A2C: 246.0 ml LV vol d, MOD A4C: 225.0 ml LV vol s, MOD A2C: 213.0 ml LV vol s, MOD A4C: 200.0 ml LV SV MOD A2C:     33.0 ml LV SV MOD A4C:     225.0 ml LV SV MOD BP:      25.4 ml RIGHT VENTRICLE            IVC RV S prime:     7.35 cm/s  IVC diam: 3.20 cm TAPSE (M-mode): 0.6 cm LEFT ATRIUM             Index        RIGHT ATRIUM           Index LA diam:        4.20 cm 2.00 cm/m   RA Area:     23.80 cm LA Vol (A2C):   67.3 ml 32.02 ml/m  RA Volume:   65.40 ml  31.12 ml/m LA Vol (A4C):   77.8 ml 37.02 ml/m LA Biplane Vol: 77.3 ml 36.78 ml/m  AORTIC VALVE LVOT Vmax:    112.50 cm/s LVOT Vmean:  70.050 cm/s LVOT VTI:    0.151 m AI PHT:      221 msec  AORTA Ao Root diam: 3.70 cm Ao Asc diam:  3.60 cm MITRAL VALVE                  TRICUSPID VALVE MV Area (PHT): 6.19 cm       TR Peak grad:   36.2 mmHg MV Decel Time: 123 msec       TR Vmax:        301.00 cm/s MR Peak grad:    105.7 mmHg MR Mean grad:    72.0 mmHg    SHUNTS MR Vmax:         514.00 cm/s  Systemic VTI:  0.15 m MR Vmean:        403.0 cm/s   Systemic Diam: 2.10 cm MR PISA:         3.08 cm MR PISA Eff ROA: 21 mm MR PISA Radius:  0.70 cm MV E velocity: 129.50 cm/s Arvilla Meres MD Electronically signed by Arvilla Meres MD Signature Date/Time: 02/19/2022/10:01:12 AM    Final    [place] EKG SITE RITE  Result Date: 01/26/2022 If Site Rite image not attached, placement could not be confirmed due to current cardiac rhythm.  Medications:     Scheduled Medications:  allopurinol  300 mg Oral Daily   atorvastatin  10 mg Oral Daily   Chlorhexidine Gluconate Cloth  6 each Topical Daily   dapagliflozin propanediol  10 mg Oral Daily   digoxin  0.125 mg Oral Daily   enoxaparin (LOVENOX) injection  50 mg Subcutaneous Q24H   hydrALAZINE  25 mg Oral Q8H   isosorbide mononitrate  30 mg Oral Daily   potassium chloride  40 mEq Oral BID   sacubitril-valsartan  1 tablet Oral BID   sodium chloride flush  10-40 mL Intracatheter Q12H   sodium chloride flush  3 mL Intravenous Q12H   spironolactone  25 mg Oral Daily    Infusions:  sodium chloride     furosemide (LASIX) 200 mg in dextrose 5% 100 mL (2mg /mL) infusion     milrinone      PRN Medications: sodium chloride, acetaminophen, ondansetron (ZOFRAN) IV, sodium chloride flush, sodium chloride flush   Assessment/Plan   1. Acute on Chronic Biventricular Heart Failure  - due to NICM - suspect due to HTN and OSA +/- ETOH - cath 03/2013 no CAD - Echo 11/20 EF 30-35% - Echo 4/21 EF 20-25% moderate RV dysfunction (worse since previous) - Personally reviewed -  TEE on 04/18/20. Procedure c/b by severe respiratory distress and laryngospasm. LVEF 25% RV moderately HK. Aortic valve not interrogated completely due to respiratory distress. AI appeared to be moderate.  - cMRI 8/21 EF 24% Moderate AI. No LGE or infiltrative process. - Echo 03/22/21 EF 25-30% mod AI Moderate RV dysfunction.  - Echo 01/27/2022 EF < 20% with mild LV dilation, RV severely HK with moderate dilation and D-shaped septum, AI moderate to severe.  - Suspect CM related to HTN +/- ETOH. AI may be contributing but severity of biventricular dysfunction out of proportion to AI and LV not overly dilated. - NYHA IIIB-IV with marked volume overload.  - Ideally will need transplant but is still smoking and using THC. Will need to try and bridge for 6 months to possibly get him to transplant. LVAD will be difficult, RV is severely dysfunctional.   - Now with PICC, co-ox 54%.  CVP not set up but he is volume overloaded.  Not much response to IV Lasix so far.  - Restart spironolactone 25 mg daily. - Restart Entresto 97/103 mg bid - On carvedilol 50 mg bid will hold for now - Restart hydralazine/Imdur at lower doses than home (not sure fully compliant) and will titrate up as able.  - Continue Farxiga 10 mg daily. - Continue digoxin 0.125 mg daily. - Will start milrinone 0.25 mcg/kg/min with marginal co-ox and need for extensive diuresis.   - Lasix 80 mg IV x 1 this morning, start gtt 12 mg/hr.  Set up CVP.  - Needs stat BMET and Mg, replace electrolytes as needed.  - Cath planned after diuresis.    2. Moderate-severe aortic regurgitation - Visually mild to moderate on TEE 4/21 but P1/2 time 215 msec suggesting severe AI.  - TEE on 04/18/20. Procedure c/b by severe respiratory distress and laryngospasm. LVEF 25% RV moderately HK. Aortic valve not interrogated completely due to respiratory distress. AI appeared to be moderate - TEE reviewed with echo team felt to be moderate at worst. - Mild AI on echo  3/22. - Moderate to severe AI on echo 02/18/2022.  On my review, LV is mildly dilated. Suspect AI is not cause of cardiomyopathy but is likely making symptoms  worse.  - Diurese, afterload reduce.    3. Hypertension - Restart home meds as above.    4. OSA on CPAP - Fully compliant w/ CPAP.  - Follows with Dr. Radford Pax.   5. Obesity. -Body mass index is 36.6 kg/m.   6. Tobacco use/ETOH - Says he stopped drinking - Continues to smoke, encouraged to quit.    7. ? Fatty Liver - Reports recent diagnosis at [facility], records not available. - CMET 10/22 ok.   Length of Stay: 1  Loralie Champagne, MD  02/09/2022, 8:44 AM  Advanced Heart Failure Team Pager [phone] (M-F; 7a - 5p)  Please contact [facility] for night-coverage after hours (5p -7a ) and weekends on [email or web address]

## 2022-02-09 NOTE — Plan of Care (Signed)

## 2022-02-10 DIAGNOSIS — I5023 Acute on chronic systolic (congestive) heart failure: Secondary | ICD-10-CM | POA: Diagnosis not present

## 2022-02-10 LAB — COOXEMETRY PANEL
Carboxyhemoglobin: 2.6 % — ABNORMAL HIGH (ref 0.5–1.5)
Carboxyhemoglobin: 2 % — ABNORMAL HIGH (ref 0.5–1.5)
Methemoglobin: <0.7 % (ref 0.0–1.5)
Methemoglobin: 0.7 % (ref 0.0–1.5)
O2 Saturation: 65.5 %
O2 Saturation: 67.6 %
Total hemoglobin: 17.9 g/dL — ABNORMAL HIGH (ref 12.0–16.0)
Total hemoglobin: 17.1 g/dL — ABNORMAL HIGH (ref 12.0–16.0)

## 2022-02-10 LAB — CBC
HCT: 54.4 % — ABNORMAL HIGH (ref 39.0–52.0)
Hemoglobin: 17.4 g/dL — ABNORMAL HIGH (ref 13.0–17.0)
MCH: 29.2 pg (ref 26.0–34.0)
MCHC: 32 g/dL (ref 30.0–36.0)
MCV: 91.3 fL (ref 80.0–100.0)
Platelets: 176 10*3/uL (ref 150–400)
RBC: 5.96 MIL/uL — ABNORMAL HIGH (ref 4.22–5.81)
RDW: 14.9 % (ref 11.5–15.5)
WBC: 7.1 10*3/uL (ref 4.0–10.5)
nRBC: 0 % (ref 0.0–0.2)

## 2022-02-10 LAB — BLOOD GAS, ARTERIAL
Acid-Base Excess: 4 mmol/L — ABNORMAL HIGH (ref 0.0–2.0)
Bicarbonate: 27.6 mmol/L (ref 20.0–28.0)
Drawn by: 54887
FIO2: 40 %
O2 Saturation: 98.9 %
Patient temperature: 37
pCO2 arterial: 37 mmHg (ref 32–48)
pH, Arterial: 7.48 — ABNORMAL HIGH (ref 7.35–7.45)
pO2, Arterial: 115 mmHg — ABNORMAL HIGH (ref 83–108)

## 2022-02-10 LAB — BASIC METABOLIC PANEL
Anion gap: 10 (ref 5–15)
BUN: 13 mg/dL (ref 6–20)
CO2: 28 mmol/L (ref 22–32)
Calcium: 8.4 mg/dL — ABNORMAL LOW (ref 8.9–10.3)
Chloride: 96 mmol/L — ABNORMAL LOW (ref 98–111)
Creatinine, Ser: 1.33 mg/dL — ABNORMAL HIGH (ref 0.61–1.24)
GFR, Estimated: >60 mL/min (ref >60)
Glucose, Bld: 224 mg/dL — ABNORMAL HIGH (ref 70–99)
Potassium: 3.7 mmol/L (ref 3.5–5.1)
Sodium: 134 mmol/L — ABNORMAL LOW (ref 135–145)

## 2022-02-10 LAB — MAGNESIUM: Magnesium: 2.1 mg/dL (ref 1.7–2.4)

## 2022-02-10 MED ORDER — MELATONIN 3 MG PO TABS
3.0000 mg | ORAL_TABLET | Freq: Every day | ORAL | Status: DC
  Administered 2022-02-10 – 2022-02-21 (×12): 3 mg via ORAL
  Filled 2022-02-10 (×12): qty 1

## 2022-02-10 MED ORDER — CARVEDILOL 6.25 MG PO TABS: 6.2500 mg | ORAL_TABLET | Freq: Two times a day (BID) | ORAL | Status: DC

## 2022-02-10 MED ORDER — POTASSIUM CHLORIDE CRYS ER 20 MEQ PO TBCR
40.0000 meq | EXTENDED_RELEASE_TABLET | Freq: Two times a day (BID) | ORAL | Status: DC
  Administered 2022-02-10 – 2022-02-11 (×3): 40 meq via ORAL
  Filled 2022-02-10 (×3): qty 2

## 2022-02-10 MED ORDER — CARVEDILOL 6.25 MG PO TABS
6.2500 mg | ORAL_TABLET | Freq: Two times a day (BID) | ORAL | Status: DC
  Administered 2022-02-10: 6.25 mg via ORAL
  Filled 2022-02-10: qty 1

## 2022-02-10 NOTE — Plan of Care (Signed)

## 2022-02-10 NOTE — Progress Notes (Signed)
Called to the beside: RE- worsening hypoxia.  Briefly 51 yo M with acute on chronic biventricular failure with AKI thought to be low output related complicated by moderate to severe AI  Patient notes worsening shortness of breath despite being on a lasix drip and milrinone.   Nursing notes worsening Sats and higher O2 need Patient notes L wrist and elbow pain   Exam notable for  Elevated JVD Mild distress Diastolic murmur Non-tenderly distended abdomen with positive fluid wave Minimal LE edema  Telemetry notable for one beat of NSVT 1300 Otherwise Sinus tach  On 2.5 milrinone and lasix drip of 12   - change from CPAP-> BIPAP - will send repeat Venous Co-Ox - reaching out to on call AHF team  Placed on BIPAP with improvement in Sat's  BP 94/61 Symptoms have improved Co-Ox is Pending  - if pain control is poorly managed, next intervention for gout may be steroids - will close the loop with AHF team  Rudean Haskell, [place]  [address] [place], [place] [address] [phone]  4:09 PM  CRITICAL CARE Performed by: Zeta Bucy A Lathaniel Legate  Total critical care time: 30 minutes. Critical care time was exclusive of separately billable procedures and treating other patients. Critical care was necessary to treat or prevent imminent or life-threatening deterioration. Critical care was time spent personally by me on the following activities: development of treatment plan with patient and/or surrogate as well as nursing, discussions with consultants, evaluation of patient's response to treatment, examination of patient, obtaining history from patient or surrogate, ordering and performing treatments and interventions, ordering and review of laboratory studies, ordering and review of radiographic studies, pulse oximetry and re-evaluation of patient's condition.    Signed, Rudean Haskell, [place]   02/10/2022 4:32 PM

## 2022-02-10 NOTE — Progress Notes (Signed)
ABG sample collected and sent to Lab. Lab was called and notified.  

## 2022-02-10 NOTE — Progress Notes (Signed)
Pt already placed on BiPAP. RT will cont to monitor.

## 2022-02-10 NOTE — Progress Notes (Signed)
Pt requests melatonin re-ordered for sleep as he takes this at home; order placed.

## 2022-02-10 NOTE — Progress Notes (Signed)
Pt was placed on home unit CPAP with 3L bleed in.

## 2022-02-10 NOTE — Progress Notes (Signed)
Mobility Specialist Progress Note   02/10/22 1530  Mobility  Activity Refused mobility   Claiming gout has been real inflamed today and that the feeling is too painful to be mobilizing. Will have MS f/u tomorrow w/ pt  Holland Falling Mobility Specialist Phone Number [phone]

## 2022-02-10 NOTE — Progress Notes (Signed)
Mobility Specialist Progress Note   02/10/22 1140  Mobility  Activity Refused mobility   Pt claiming to be real tired and would like to rest, asked if I could come back at 3p. Will follow back up if time permits.  Frederico Hamman Mobility Specialist Phone Number [phone]

## 2022-02-10 NOTE — Progress Notes (Addendum)
Pt throughout the day has had increased supplemental oxygen demands despite wearing cpap to sleep and raising 02 levels throughout the day. Pt is struggling to keep sats above 85 while sleeping and 88 while awake.  Pt has increased respiratory effort. Pt has been drowsy all day but states he hasn't slept much since he was admitted.  Respiratory notified  Cards notified

## 2022-02-10 NOTE — Progress Notes (Signed)
Patient ID: Colin Walton, male   DOB: Oct 05, 1971, 51 y.o.   MRN: [id number]     Advanced Heart Failure Rounding Note  PCP-Cardiologist: Lauree Chandler, MD   Subjective:    Co-ox 66% on milrinone 0.25.  Good diuresis, weight down 4 lbs, CVP 12-13 today.  He is on Lasix gtt 12 mg/hr.  Creatinine stable 1.33.  Sinus tachy 110s.   Still with orthopnea.    Objective:   Weight Range: 103.9 kg Body mass index is 35.88 kg/m.   Vital Signs:   Temp:  [97.8 F (36.6 C)-99.2 F (37.3 C)] 98.3 F (36.8 C) (02/19 0804) Pulse Rate:  [99-119] 119 (02/19 0804) Resp:  [17-20] 20 (02/19 0804) BP: (112-133)/(58-79) 114/58 (02/19 0804) SpO2:  [90 %-99 %] 90 % (02/19 0804) Weight:  [103.9 kg] 103.9 kg (02/19 0312)    Weight change: Filed Weights   02/11/2022 1625 02/09/22 0427 02/10/22 0312  Weight: 106 kg 106.1 kg 103.9 kg    Intake/Output:   Intake/Output Summary (Last 24 hours) at 02/10/2022 0902 Last data filed at 02/10/2022 0817 Gross per 24 hour  Intake 996.53 ml  Output 4300 ml  Net -3303.47 ml      Physical Exam    General: NAD Neck: JVP 12-14, no thyromegaly or thyroid nodule.  Lungs: Clear to auscultation bilaterally with normal respiratory effort. CV: Nondisplaced PMI.  Heart tachy, regular S1/S2, no [id number], 1/6 diastolic murmur sternal border.  1+ edema to knees.  Abdomen: Soft, nontender, no hepatosplenomegaly, no distention.  Skin: Intact without lesions or rashes.  Neurologic: Alert and oriented x 3.  Psych: Normal affect. Extremities: No clubbing or cyanosis.  HEENT: Normal.    Telemetry   Sinus 110s (personally reviewed)   Labs    CBC Recent Labs    02/05/2022 1649 02/10/22 0047  WBC 5.6 7.1  NEUTROABS 2.6  --   HGB 16.5 17.4*  HCT 51.6 54.4*  MCV 92.6 91.3  PLT 153 0000000   Basic Metabolic Panel Recent Labs    02/09/22 0845 02/09/22 1615 02/10/22 0047  NA 138 137 134*  K 2.9* 3.6 3.7  CL 94* 96* 96*  CO2 32 29 28  GLUCOSE 171* 162*  224*  BUN 17 15 13   CREATININE 1.46* 1.36* 1.33*  CALCIUM 9.0 8.5* 8.4*  MG 1.9  --  2.1   Liver Function Tests Recent Labs    02/07/2022 1649  AST 32  ALT 16  ALKPHOS 119  BILITOT 2.9*  PROT 7.2  ALBUMIN 3.6   No results for input(s): LIPASE, AMYLASE in the last 72 hours. Cardiac Enzymes No results for input(s): CKTOTAL, CKMB, CKMBINDEX, TROPONINI in the last 72 hours.  BNP: BNP (last 3 results) Recent Labs    03/22/21 1106 01/29/2022 1649  BNP 995.8* 1,303.2*    ProBNP (last 3 results) No results for input(s): PROBNP in the last 8760 hours.   D-Dimer No results for input(s): DDIMER in the last 72 hours. Hemoglobin A1C No results for input(s): HGBA1C in the last 72 hours. Fasting Lipid Panel No results for input(s): CHOL, HDL, LDLCALC, TRIG, CHOLHDL, LDLDIRECT in the last 72 hours. Thyroid Function Tests Recent Labs    02/03/2022 1649  TSH 1.796    Other results:   Imaging    No results found.   Medications:     Scheduled Medications:  allopurinol  300 mg Oral Daily   atorvastatin  10 mg Oral Daily   carvedilol  6.25 mg Oral BID WC  Chlorhexidine Gluconate Cloth  6 each Topical Daily   dapagliflozin propanediol  10 mg Oral Daily   digoxin  0.125 mg Oral Daily   enoxaparin (LOVENOX) injection  50 mg Subcutaneous Q24H   hydrALAZINE  25 mg Oral Q8H   isosorbide mononitrate  30 mg Oral Daily   potassium chloride  40 mEq Oral BID   potassium chloride  40 mEq Oral BID   sacubitril-valsartan  1 tablet Oral BID   sodium chloride flush  10-40 mL Intracatheter Q12H   sodium chloride flush  3 mL Intravenous Q12H   spironolactone  25 mg Oral Daily    Infusions:  sodium chloride     furosemide (LASIX) 200 mg in dextrose 5% 100 mL (2mg /mL) infusion 12 mg/hr (02/10/22 0400)   milrinone 0.25 mcg/kg/min (02/10/22 0400)    PRN Medications: sodium chloride, acetaminophen, ondansetron (ZOFRAN) IV, sodium chloride flush, sodium chloride  flush   Assessment/Plan   1. Acute on Chronic Biventricular Heart Failure  - due to NICM - suspect due to HTN and OSA +/- ETOH - cath 03/2013 no CAD - Echo 11/20 EF 30-35% - Echo 4/21 EF 20-25% moderate RV dysfunction (worse since previous) - Personally reviewed - TEE on 04/18/20. Procedure c/b by severe respiratory distress and laryngospasm. LVEF 25% RV moderately HK. Aortic valve not interrogated completely due to respiratory distress. AI appeared to be moderate.  - cMRI 8/21 EF 24% Moderate AI. No LGE or infiltrative process. - Echo 03/22/21 EF 25-30% mod AI Moderate RV dysfunction.  - Echo 01/30/2022 EF < 20% with mild LV dilation, RV severely HK with moderate dilation and D-shaped septum, AI moderate to severe.  - Suspect CM related to HTN +/- ETOH. AI may be contributing but severity of biventricular dysfunction out of proportion to AI and LV not overly dilated. - NYHA IIIB-IV with marked volume overload.  - Ideally will need transplant but is still smoking and using THC. Will need to try and bridge for 6 months to possibly get him to transplant. LVAD will be difficult, RV is severely dysfunctional.   - Co-ox 54% initially, now up 66% on milrinone 0.25.  Continue while diuresing.  - Continue spironolactone 25 mg daily. - Continue Entresto 97/103 mg bid - On carvedilol 50 mg bid at home, held initially now tachycardic on milrinone (suspect some of this is rebound effect).  Will restart for now at lower dose 6.25 mg bid. - Continue hydralazine/Imdur at lower doses than home (not sure fully compliant) and will titrate up as able.  - Continue Farxiga 10 mg daily. - Continue digoxin 0.125 mg daily. - Still volume overloaded with CVP 13 but improving with good diuresis and weight down 4 lbs.  Continue Lasix 12 mg/hr for at least 1 more day.   - Replace K.   - Cath planned after diuresis.    2. Moderate-severe aortic regurgitation - Visually mild to moderate on TEE 4/21 but P1/2 time 215 msec  suggesting severe AI.  - TEE on 04/18/20. Procedure c/b by severe respiratory distress and laryngospasm. LVEF 25% RV moderately HK. Aortic valve not interrogated completely due to respiratory distress. AI appeared to be moderate - TEE reviewed with echo team felt to be moderate at worst. - Mild AI on echo 3/22. - Moderate to severe AI on echo 02/03/2022.  On my review, LV is mildly dilated. Suspect AI is not cause of cardiomyopathy but is likely making symptoms worse.  - Diurese, afterload reduce.    3.  Hypertension - Stable today.    4. OSA on CPAP - Fully compliant w/ CPAP.  - Follows with Dr. Radford Pax.   5. Obesity. -Body mass index is 36.6 kg/m.   6. Tobacco use/ETOH - Says he stopped drinking - Continues to smoke, encouraged to quit.    7. ? Fatty Liver - Reports recent diagnosis at [facility], records not available. - CMET 10/22 ok.   Length of Stay: 2  Loralie Champagne, MD  02/10/2022, 9:02 AM  Advanced Heart Failure Team Pager [phone] (M-F; 7a - 5p)  Please contact [facility] for night-coverage after hours (5p -7a ) and weekends on [email or web address]

## 2022-02-11 DIAGNOSIS — I5023 Acute on chronic systolic (congestive) heart failure: Secondary | ICD-10-CM | POA: Diagnosis not present

## 2022-02-11 LAB — CBC
HCT: 54.1 % — ABNORMAL HIGH (ref 39.0–52.0)
Hemoglobin: 17.2 g/dL — ABNORMAL HIGH (ref 13.0–17.0)
MCH: 29.4 pg (ref 26.0–34.0)
MCHC: 31.8 g/dL (ref 30.0–36.0)
MCV: 92.3 fL (ref 80.0–100.0)
Platelets: 195 10*3/uL (ref 150–400)
RBC: 5.86 MIL/uL — ABNORMAL HIGH (ref 4.22–5.81)
RDW: 15.1 % (ref 11.5–15.5)
WBC: 8.8 10*3/uL (ref 4.0–10.5)
nRBC: 0 % (ref 0.0–0.2)

## 2022-02-11 LAB — BASIC METABOLIC PANEL
Anion gap: 10 (ref 5–15)
BUN: 20 mg/dL (ref 6–20)
CO2: 26 mmol/L (ref 22–32)
Calcium: 8.8 mg/dL — ABNORMAL LOW (ref 8.9–10.3)
Chloride: 96 mmol/L — ABNORMAL LOW (ref 98–111)
Creatinine, Ser: 1.91 mg/dL — ABNORMAL HIGH (ref 0.61–1.24)
GFR, Estimated: 42 mL/min — ABNORMAL LOW (ref >60)
Glucose, Bld: 195 mg/dL — ABNORMAL HIGH (ref 70–99)
Potassium: 4.2 mmol/L (ref 3.5–5.1)
Sodium: 132 mmol/L — ABNORMAL LOW (ref 135–145)

## 2022-02-11 LAB — COOXEMETRY PANEL
Carboxyhemoglobin: 1.6 % — ABNORMAL HIGH (ref 0.5–1.5)
Methemoglobin: <0.7 % (ref 0.0–1.5)
O2 Saturation: 62.9 %
Total hemoglobin: 17.8 g/dL — ABNORMAL HIGH (ref 12.0–16.0)

## 2022-02-11 LAB — BRAIN NATRIURETIC PEPTIDE: B Natriuretic Peptide: 166.8 pg/mL — ABNORMAL HIGH (ref 0.0–100.0)

## 2022-02-11 LAB — URIC ACID: Uric Acid, Serum: 6 mg/dL (ref 3.7–8.6)

## 2022-02-11 MED ORDER — NOREPINEPHRINE 4 MG/250ML-% IV SOLN
1.0000 ug/min | INTRAVENOUS | Status: DC
  Administered 2022-02-11 (×2): 5 ug/min via INTRAVENOUS
  Administered 2022-02-12: 3 ug/min via INTRAVENOUS
  Administered 2022-02-13 – 2022-02-17 (×3): 2 ug/min via INTRAVENOUS
  Administered 2022-02-19: 1 ug/min via INTRAVENOUS
  Filled 2022-02-11 (×8): qty 250

## 2022-02-11 MED ORDER — MIDODRINE HCL 5 MG PO TABS
5.0000 mg | ORAL_TABLET | Freq: Three times a day (TID) | ORAL | Status: DC
  Administered 2022-02-11 – 2022-02-13 (×7): 5 mg via ORAL
  Filled 2022-02-11 (×7): qty 1

## 2022-02-11 MED ORDER — PREDNISONE 20 MG PO TABS
40.0000 mg | ORAL_TABLET | Freq: Every day | ORAL | Status: AC
  Administered 2022-02-11 – 2022-02-13 (×3): 40 mg via ORAL
  Filled 2022-02-11 (×3): qty 2

## 2022-02-11 MED ORDER — FUROSEMIDE 10 MG/ML IJ SOLN
6.0000 mg/h | INTRAVENOUS | Status: DC
  Administered 2022-02-11: 6 mg/h via INTRAVENOUS
  Filled 2022-02-11: qty 20

## 2022-02-11 MED ORDER — GUAIFENESIN-DM 100-10 MG/5ML PO SYRP
5.0000 mL | ORAL_SOLUTION | ORAL | Status: DC | PRN
  Administered 2022-02-11 – 2022-02-14 (×2): 5 mL via ORAL
  Filled 2022-02-11 (×2): qty 5

## 2022-02-11 NOTE — TOC Initial Note (Addendum)
Transition of Care [facility]) - Initial/Assessment Note    Patient Details  Name: Colin Walton MRN: [id number] Date of Birth: 1971/11/10  Transition of [facility]) CM/SW Contact:    Elliot Cousin, RN Phone Number: [phone] 02/11/2022, 1:14 PM  Clinical Narrative:                 HF TOC CM spoke to pt at bedside. Pt states he still works full-time. States he will contact his employer to get his FMLA started. Provided pt with CM email and will help pt with FMLA. Will continue to follow for dc needs.  Pt has scale but does not weigh daily. He eats out more often than at home. States he does not follow a low sodium diet. Educated on importance of a low sodium diet and daily weight. Explained he will continue to receive HF education during his admission.   Pt may need oxygen at discharge.  Expected Discharge Plan: Home/Self Care Barriers to Discharge: Continued Medical Work up   Patient Goals and CMS Choice Patient states their goals for this hospitalization and ongoing recovery are:: wants to remain independent CMS Medicare.gov Compare Post Acute Care list provided to:: Patient    Expected Discharge Plan and Services Expected Discharge Plan: Home/Self Care   Discharge Planning Services: CM Consult                                          Prior Living Arrangements/Services   Lives with:: Significant Other          Need for Family Participation in Patient Care: No (Comment) Care giver support system in place?: No (comment)   Criminal Activity/Legal Involvement Pertinent to Current Situation/Hospitalization: No - Comment as needed  Activities of Daily Living Home Assistive Devices/Equipment: None ADL Screening (condition at time of admission) Patient's cognitive ability adequate to safely complete daily activities?: Yes Is the patient deaf or have difficulty hearing?: No Does the patient have difficulty seeing, even when wearing glasses/contacts?: No Does  the patient have difficulty concentrating, remembering, or making decisions?: No Patient able to express need for assistance with ADLs?: Yes Does the patient have difficulty dressing or bathing?: No Independently performs ADLs?: Yes (appropriate for developmental age) Does the patient have difficulty walking or climbing stairs?: No Weakness of Legs: None Weakness of Arms/Hands: None  Permission Sought/Granted Permission sought to share information with : Case Manager, Family Supports, PCP Permission granted to share information with : Yes, Verbal Permission Granted  Share Information with NAME: Salik Grewell     Permission granted to share info w Relationship: mother  Permission granted to share info w Contact Information: [phone]  Emotional Assessment Appearance:: Appears stated age Attitude/Demeanor/Rapport: Engaged Affect (typically observed): Accepting Orientation: : Oriented to Self, Oriented to Place, Oriented to  Time, Oriented to Situation   Psych Involvement: No (comment)  Admission diagnosis:  Acute on chronic systolic (congestive) heart failure (HCC) [I50.23] Patient Active Problem List   Diagnosis Date Noted   Acute on chronic systolic (congestive) heart failure (HCC) 2022/03/01   Alcohol abuse 04/24/2013   Essential hypertension, benign 04/24/2013   CHF (congestive heart failure) (HCC) 01/18/2013   Bleeding nose 01/05/2013   OSA (obstructive sleep apnea) 01/05/2013   Health care maintenance 01/05/2013   PCP:  Shanna Cisco, NP Pharmacy:   [facility] [address]  - [address] [place] [address] Phone: [phone] Fax: [phone]  [facility] [address] [place] [address] Phone: [phone] Fax: [phone]     Social Determinants of Health (SDOH) Interventions    Readmission Risk Interventions No flowsheet data found.

## 2022-02-11 NOTE — Plan of Care (Signed)

## 2022-02-11 NOTE — Progress Notes (Signed)
Heart Failure Navigator Progress Note  Assessed for Heart & Vascular TOC clinic readiness.  Patient does not meet criteria due to prior to hospitalization patient established with AHF clinic, Dr. Gala Romney and paramedicine. AHF rounding team is primary.    Navigator available for reassessment of patient.   Ozella Rocks, MSN, RN Heart Failure Nurse Navigator [phone]

## 2022-02-11 NOTE — Plan of Care (Signed)
°  Problem: Education: Goal: Knowledge of General Education information will improve Description: Including pain rating scale, medication(s)/side effects and non-pharmacologic comfort measures Outcome: Progressing   Problem: Health Behavior/Discharge Planning: Goal: Ability to manage health-related needs will improve Outcome: Progressing   Problem: Clinical Measurements: Goal: Ability to maintain clinical measurements within normal limits will improve Outcome: Progressing Goal: Will remain free from infection Outcome: Progressing Goal: Diagnostic test results will improve Outcome: Progressing Goal: Cardiovascular complication will be avoided Outcome: Progressing   Problem: Nutrition: Goal: Adequate nutrition will be maintained Outcome: Progressing   Problem: Coping: Goal: Level of anxiety will decrease Outcome: Progressing   Problem: Elimination: Goal: Will not experience complications related to bowel motility Outcome: Progressing Goal: Will not experience complications related to urinary retention Outcome: Progressing   Problem: Safety: Goal: Ability to remain free from injury will improve Outcome: Progressing   Problem: Skin Integrity: Goal: Risk for impaired skin integrity will decrease Outcome: Progressing   Problem: Education: Goal: Ability to demonstrate management of disease process will improve Outcome: Progressing Goal: Ability to verbalize understanding of medication therapies will improve Outcome: Progressing Goal: Individualized Educational Video(s) Outcome: Progressing   Problem: Activity: Goal: Capacity to carry out activities will improve Outcome: Progressing   Problem: Cardiac: Goal: Ability to achieve and maintain adequate cardiopulmonary perfusion will improve Outcome: Progressing   Problem: Clinical Measurements: Goal: Respiratory complications will improve Outcome: Not Progressing   Problem: Activity: Goal: Risk for activity  intolerance will decrease Outcome: Not Progressing   Problem: Pain Managment: Goal: General experience of comfort will improve Outcome: Not Progressing

## 2022-02-11 NOTE — Progress Notes (Addendum)
Patient ID: Colin Walton, male   DOB: September 13, 1971, 51 y.o.   MRN: [id number]     Advanced Heart Failure Rounding Note  PCP-Cardiologist: Verne Carrow, MD   Subjective:    On Milrinone 0.25. Co-ox 63%   On lasix gtt at 12/hr. Unfortunately, strict I/Os not documented yesterday but he says he did not urinate much. Wt down 3 lb. Down 7 lb total. CVP ~8-9   AKI, Scr 1.3>>1.9. Hypotension overnight. SBP 80s. Most recent BP 128/85  Also w/ diarrhea. Had ~8-9 loose stools yesterday. No abdominal pain/cramping. No subjective f/c  Overall, breathing improved. Main complaint is polyarticular gout pain, rt great toe, left knee and left wrist.     Objective:   Weight Range: 102.9 kg Body mass index is 35.53 kg/m.   Vital Signs:   Temp:  [97.9 F (36.6 C)-98.8 F (37.1 C)] 98.2 F (36.8 C) (02/20 0716) Pulse Rate:  [91-102] 101 (02/20 0749) Resp:  [16-28] 24 (02/20 0749) BP: (87-128)/(50-85) 128/85 (02/20 0716) SpO2:  [84 %-95 %] 93 % (02/20 0749) Weight:  [102.9 kg] 102.9 kg (02/20 0415) Last BM Date : 02/10/22  Weight change: Filed Weights   02/09/22 0427 02/10/22 0312 02/11/22 0415  Weight: 106.1 kg 103.9 kg 102.9 kg    Intake/Output:   Intake/Output Summary (Last 24 hours) at 02/11/2022 0812 Last data filed at 02/11/2022 0400 Gross per 24 hour  Intake 1520.08 ml  Output 500 ml  Net 1020.08 ml      Physical Exam    CVP 8-9  General:  Well appearing. No respiratory difficulty HEENT: normal Neck: supple. JVD 10 cm. Carotids 2+ bilat; no bruits. No lymphadenopathy or thyromegaly appreciated. Cor: PMI nondisplaced. Regular rate & rhythm. + AI murmur  Lungs: decreased BS at the bases bilaterally  Abdomen: soft, nontender,  mildly distended. No hepatosplenomegaly. No bruits or masses. Good bowel sounds. Extremities: no cyanosis, clubbing, rash, edema + TED hoses + RUE PICC  Neuro: alert & oriented x 3, cranial nerves grossly intact. moves all 4 extremities w/o  difficulty. Affect pleasant.  Telemetry   Sinus 110s (personally reviewed)   Labs    CBC Recent Labs    01/28/2022 1649 02/10/22 0047 02/11/22 0345  WBC 5.6 7.1 8.8  NEUTROABS 2.6  --   --   HGB 16.5 17.4* 17.2*  HCT 51.6 54.4* 54.1*  MCV 92.6 91.3 92.3  PLT 153 176 195   Basic Metabolic Panel Recent Labs    71/06/26 0845 02/09/22 1615 02/10/22 0047 02/11/22 0345  NA 138   < > 134* 132*  K 2.9*   < > 3.7 4.2  CL 94*   < > 96* 96*  CO2 32   < > 28 26  GLUCOSE 171*   < > 224* 195*  BUN 17   < > 13 20  CREATININE 1.46*   < > 1.33* 1.91*  CALCIUM 9.0   < > 8.4* 8.8*  MG 1.9  --  2.1  --    < > = values in this interval not displayed.   Liver Function Tests Recent Labs    02/11/2022 1649  AST 32  ALT 16  ALKPHOS 119  BILITOT 2.9*  PROT 7.2  ALBUMIN 3.6   No results for input(s): LIPASE, AMYLASE in the last 72 hours. Cardiac Enzymes No results for input(s): CKTOTAL, CKMB, CKMBINDEX, TROPONINI in the last 72 hours.  BNP: BNP (last 3 results) Recent Labs    03/22/21 1106 02/10/2022 1649  BNP 995.8* 1,303.2*    ProBNP (last 3 results) No results for input(s): PROBNP in the last 8760 hours.   D-Dimer No results for input(s): DDIMER in the last 72 hours. Hemoglobin A1C No results for input(s): HGBA1C in the last 72 hours. Fasting Lipid Panel No results for input(s): CHOL, HDL, LDLCALC, TRIG, CHOLHDL, LDLDIRECT in the last 72 hours. Thyroid Function Tests Recent Labs    02/16/2022 1649  TSH 1.796    Other results:   Imaging    No results found.   Medications:     Scheduled Medications:  allopurinol  300 mg Oral Daily   atorvastatin  10 mg Oral Daily   Chlorhexidine Gluconate Cloth  6 each Topical Daily   dapagliflozin propanediol  10 mg Oral Daily   digoxin  0.125 mg Oral Daily   enoxaparin (LOVENOX) injection  50 mg Subcutaneous Q24H   melatonin  3 mg Oral QHS   potassium chloride  40 mEq Oral BID   potassium chloride  40 mEq Oral BID    sodium chloride flush  10-40 mL Intracatheter Q12H   sodium chloride flush  3 mL Intravenous Q12H    Infusions:  sodium chloride     milrinone 0.25 mcg/kg/min (02/10/22 2228)    PRN Medications: sodium chloride, acetaminophen, guaiFENesin-dextromethorphan, ondansetron (ZOFRAN) IV, sodium chloride flush, sodium chloride flush   Assessment/Plan   1. Acute on Chronic Biventricular Heart Failure  - due to NICM - suspect due to HTN and OSA +/- ETOH - cath 03/2013 no CAD - Echo 11/20 EF 30-35% - Echo 4/21 EF 20-25% moderate RV dysfunction (worse since previous) - Personally reviewed - TEE on 04/18/20. Procedure c/b by severe respiratory distress and laryngospasm. LVEF 25% RV moderately HK. Aortic valve not interrogated completely due to respiratory distress. AI appeared to be moderate.  - cMRI 8/21 EF 24% Moderate AI. No LGE or infiltrative process. - Echo 03/22/21 EF 25-30% mod AI Moderate RV dysfunction.  - Echo 02/09/2022 EF < 20% with mild LV dilation, RV severely HK with moderate dilation and D-shaped septum, AI moderate to severe.  - Suspect CM related to HTN +/- ETOH. AI may be contributing but severity of biventricular dysfunction out of proportion to AI and LV not overly dilated. - NYHA IIIB-IV with marked volume overload.  - Ideally will need transplant but is still smoking and using THC. Will need to try and bridge for 6 months to possibly get him to transplant. LVAD will be difficult, RV is severely dysfunctional.   - Co-ox 54% initially, now up 63% on milrinone 0.25.  Continue while diuresing.  - Continue spironolactone 25 mg daily. - Continue Entresto 97/103 mg bid - On carvedilol 50 mg bid at home, held initially but became tachycardic on milrinone (suspect some of this is rebound effect).  Has been restarted, for now, at lower dose 6.25 mg bid. - Continue hydralazine/Imdur at lower doses than home (not sure fully compliant) and will titrate up as able.  - Continue Farxiga 10 mg  daily. - Continue digoxin 0.125 mg daily. - CVP down to 8-9, Wt down 7 lb. W/ AKI, reduce lasix gtt to 6/hr   - Follow CVPs Q4h and check BNP  - Plan RHC    2. Moderate-severe aortic regurgitation - Visually mild to moderate on TEE 4/21 but P1/2 time 215 msec suggesting severe AI.  - TEE on 04/18/20. Procedure c/b by severe respiratory distress and laryngospasm. LVEF 25% RV moderately HK. Aortic valve not interrogated  completely due to respiratory distress. AI appeared to be moderate - TEE reviewed with echo team felt to be moderate at worst. - Mild AI on echo 3/22. - Moderate to severe AI on echo 02/14/2022.  On my review, LV is mildly dilated. Suspect AI is not cause of cardiomyopathy but is likely making symptoms worse.  - Diurese, afterload reduce.    3. Hypertension - Stable today.    4. OSA on CPAP - Fully compliant w/ CPAP.  - Follows with Dr. Mayford Knife.   5. Obesity. -Body mass index is 36.6 kg/m.   6. Tobacco use/ETOH - Says he stopped drinking - Continues to smoke, encouraged to quit.    7. ? Fatty Liver - Reports recent diagnosis at [facility], records not available. - CMET 10/22 ok.  8. AKI  - Scr bump from 1.3>>1.9  - Suspect due to hypotensive episode yesterday + diarrhea +cardiorenal  - support CO w/ milrinone  - avoid hypotension  - CVP 8-9, reduce lasix gtt to 6/hr   9. Polyarticular Gout - check Uric acid  - prednisone burst  Length of Stay: 3  Brittainy Simmons, PA-C  02/11/2022, 8:12 AM  Advanced Heart Failure Team Pager [phone] (M-F; 7a - 5p)  Please contact [facility] for night-coverage after hours (5p -7a ) and weekends on [email or web address]  Agree with above  On milrinone. Remains hypotensive. Scr up. Off bipap this am. Sats mid 80s. C/o polyarticular gout.   General:  Sitting up in bed  off bipap HEENT: normal Neck: supple. JVP 10 Carotids 2+ bilat; no bruits. No lymphadenopathy or thryomegaly appreciated. Cor: PMI nondisplaced. Regular  tachy  + AI Lungs: clear Abdomen: obese soft, nontender, nondistended. No hepatosplenomegaly. No bruits or masses. Good bowel sounds. Extremities: no cyanosis, clubbing, rash, tr edema Neuro: alert & orientedx3, cranial nerves grossly intact. moves all 4 extremities w/o difficulty. Affect pleasant  He is hypotensive with AKI despite milrinone support. Will move to ICU for NE support. Need to consider advanced options. Not transplant candidate due to tobacco. RV failure amy preclude VAD. Will need cath once more stable.   CRITICAL CARE Performed by: Arvilla Meres  Total critical care time: 40 minutes  Critical care time was exclusive of separately billable procedures and treating other patients.  Critical care was necessary to treat or prevent imminent or life-threatening deterioration.  Critical care was time spent personally by me (independent of midlevel providers or residents) on the following activities: development of treatment plan with patient and/or surrogate as well as nursing, discussions with consultants, evaluation of patient's response to treatment, examination of patient, obtaining history from patient or surrogate, ordering and performing treatments and interventions, ordering and review of laboratory studies, ordering and review of radiographic studies, pulse oximetry and re-evaluation of patient's condition.  Arvilla Meres, MD  11:17 AM

## 2022-02-12 DIAGNOSIS — I5023 Acute on chronic systolic (congestive) heart failure: Secondary | ICD-10-CM | POA: Diagnosis not present

## 2022-02-12 LAB — CBC
HCT: 55.2 % — ABNORMAL HIGH (ref 39.0–52.0)
Hemoglobin: 17.6 g/dL — ABNORMAL HIGH (ref 13.0–17.0)
MCH: 29.3 pg (ref 26.0–34.0)
MCHC: 31.9 g/dL (ref 30.0–36.0)
MCV: 92 fL (ref 80.0–100.0)
Platelets: 231 10*3/uL (ref 150–400)
RBC: 6 MIL/uL — ABNORMAL HIGH (ref 4.22–5.81)
RDW: 14.7 % (ref 11.5–15.5)
WBC: 9.1 10*3/uL (ref 4.0–10.5)
nRBC: 0 % (ref 0.0–0.2)

## 2022-02-12 LAB — BASIC METABOLIC PANEL
Anion gap: 9 (ref 5–15)
BUN: 25 mg/dL — ABNORMAL HIGH (ref 6–20)
CO2: 24 mmol/L (ref 22–32)
Calcium: 8.9 mg/dL (ref 8.9–10.3)
Chloride: 100 mmol/L (ref 98–111)
Creatinine, Ser: 1.56 mg/dL — ABNORMAL HIGH (ref 0.61–1.24)
GFR, Estimated: 54 mL/min — ABNORMAL LOW (ref >60)
Glucose, Bld: 117 mg/dL — ABNORMAL HIGH (ref 70–99)
Potassium: 4.6 mmol/L (ref 3.5–5.1)
Sodium: 133 mmol/L — ABNORMAL LOW (ref 135–145)

## 2022-02-12 LAB — URINALYSIS, COMPLETE (UACMP) WITH MICROSCOPIC
Bacteria, UA: NONE SEEN
Bilirubin Urine: NEGATIVE
Glucose, UA: >=500 mg/dL — AB
Hgb urine dipstick: NEGATIVE
Ketones, ur: NEGATIVE mg/dL
Leukocytes,Ua: NEGATIVE
Nitrite: NEGATIVE
Protein, ur: NEGATIVE mg/dL
Specific Gravity, Urine: 1.011 (ref 1.005–1.030)
pH: 5 (ref 5.0–8.0)

## 2022-02-12 LAB — COOXEMETRY PANEL
Carboxyhemoglobin: 2.5 % — ABNORMAL HIGH (ref 0.5–1.5)
Carboxyhemoglobin: 1.7 % — ABNORMAL HIGH (ref 0.5–1.5)
Methemoglobin: <0.7 % (ref 0.0–1.5)
Methemoglobin: <0.7 % (ref 0.0–1.5)
O2 Saturation: 83.8 %
O2 Saturation: 73.2 %
Total hemoglobin: 17.8 g/dL — ABNORMAL HIGH (ref 12.0–16.0)
Total hemoglobin: 18.2 g/dL — ABNORMAL HIGH (ref 12.0–16.0)

## 2022-02-12 LAB — LACTATE DEHYDROGENASE: LDH: 212 U/L — ABNORMAL HIGH (ref 98–192)

## 2022-02-12 LAB — ABO/RH: ABO/RH(D): A POS

## 2022-02-12 LAB — TYPE AND SCREEN
ABO/RH(D): A POS
Antibody Screen: NEGATIVE

## 2022-02-12 MED ORDER — FUROSEMIDE 10 MG/ML IJ SOLN
80.0000 mg | Freq: Once | INTRAMUSCULAR | Status: AC
  Administered 2022-02-12: 80 mg via INTRAVENOUS
  Filled 2022-02-12: qty 8

## 2022-02-12 MED ORDER — ORAL CARE MOUTH RINSE: 15.0000 mL | Freq: Two times a day (BID) | OROMUCOSAL | Status: DC

## 2022-02-12 MED ORDER — CHLORHEXIDINE GLUCONATE 0.12 % MT SOLN
15.0000 mL | Freq: Two times a day (BID) | OROMUCOSAL | Status: DC
  Administered 2022-02-12 – 2022-02-22 (×19): 15 mL via OROMUCOSAL
  Filled 2022-02-12 (×20): qty 15

## 2022-02-12 NOTE — Progress Notes (Addendum)
Patient ID: Colin Walton, male   DOB: [birth year], 51 y.o.   MRN: [id number]     Advanced Heart Failure Rounding Note  PCP-Cardiologist: Lauree Chandler, MD   Subjective:    02/20: NE and midodrine added d/t hypotension and progressive AKI. Lasix gtt discontinued.   Co-ox 84% on NE 5 + milrinone 0.25  Scr 1.33>1.91>1.56  SBP 120s-150s this am.  Feeling much better today. Energy improved. No dyspnea.  Joint pains improving with prednisone burst.   Objective:   Weight Range: 103.1 kg Body mass index is 35.6 kg/m.   Vital Signs:   Temp:  [98 F (36.7 C)-98.6 F (37 C)] 98.4 F (36.9 C) (02/21 0400) Pulse Rate:  [76-121] 89 (02/21 0700) Resp:  [13-24] 15 (02/21 0700) BP: (80-150)/(47-102) 147/64 (02/21 0700) SpO2:  [88 %-96 %] 90 % (02/21 0700) FiO2 (%):  [40 %] 40 % (02/20 2236) Weight:  [103.1 kg] 103.1 kg (02/21 0500) Last BM Date : 02/10/22  Weight change: Filed Weights   02/10/22 0312 02/11/22 0415 02/12/22 0500  Weight: 103.9 kg 102.9 kg 103.1 kg    Intake/Output:   Intake/Output Summary (Last 24 hours) at 02/12/2022 0826 Last data filed at 02/12/2022 0600 Gross per 24 hour  Intake 1685.33 ml  Output 1450 ml  Net 235.33 ml      Physical Exam    CVP 8-9 General:  No distress. Lying comfortably in bed HEENT: normal Neck: supple. JVP 8-10 cm. Carotids 2+ bilat; no bruits.  Cor: PMI nondisplaced. Regular rate & rhythm. No rubs, gallops or murmurs. Lungs: clear Abdomen: soft, nontender, nondistended. No hepatosplenomegaly.  Extremities: no cyanosis, clubbing, rash, + TED hose, + RUE PICC Neuro: alert & orientedx3, cranial nerves grossly intact. moves all 4 extremities w/o difficulty. Affect pleasant   Telemetry   Sinus 80s (personally reviewed)   Labs    CBC Recent Labs    02/11/22 0345 02/12/22 0505  WBC 8.8 9.1  HGB 17.2* 17.6*  HCT 54.1* 55.2*  MCV 92.3 92.0  PLT 195 [id number]   Basic Metabolic Panel Recent Labs    02/09/22 0845  02/09/22 1615 02/10/22 0047 02/11/22 0345 02/12/22 0505  NA 138   < > 134* 132* 133*  K 2.9*   < > 3.7 4.2 4.6  CL 94*   < > 96* 96* 100  CO2 32   < > 28 26 24   GLUCOSE 171*   < > 224* 195* 117*  BUN 17   < > 13 20 25*  CREATININE 1.46*   < > 1.33* 1.91* 1.56*  CALCIUM 9.0   < > 8.4* 8.8* 8.9  MG 1.9  --  2.1  --   --    < > = values in this interval not displayed.   Liver Function Tests No results for input(s): AST, ALT, ALKPHOS, BILITOT, PROT, ALBUMIN in the last 72 hours.  No results for input(s): LIPASE, AMYLASE in the last 72 hours. Cardiac Enzymes No results for input(s): CKTOTAL, CKMB, CKMBINDEX, TROPONINI in the last 72 hours.  BNP: BNP (last 3 results) Recent Labs    03/22/21 1106 01/29/2022 1649 02/11/22 0345  BNP 995.8* 1,303.2* 166.8*    ProBNP (last 3 results) No results for input(s): PROBNP in the last 8760 hours.   D-Dimer No results for input(s): DDIMER in the last 72 hours. Hemoglobin A1C No results for input(s): HGBA1C in the last 72 hours. Fasting Lipid Panel No results for input(s): CHOL, HDL, LDLCALC, TRIG, CHOLHDL, LDLDIRECT in  the last 72 hours. Thyroid Function Tests No results for input(s): TSH, T4TOTAL, T3FREE, THYROIDAB in the last 72 hours.  Invalid input(s): FREET3   Other results:   Imaging    No results found.   Medications:     Scheduled Medications:  allopurinol  300 mg Oral Daily   atorvastatin  10 mg Oral Daily   Chlorhexidine Gluconate Cloth  6 each Topical Daily   enoxaparin (LOVENOX) injection  50 mg Subcutaneous Q24H   melatonin  3 mg Oral QHS   midodrine  5 mg Oral TID WC   predniSONE  40 mg Oral Q breakfast   sodium chloride flush  10-40 mL Intracatheter Q12H   sodium chloride flush  3 mL Intravenous Q12H    Infusions:  sodium chloride     milrinone 0.25 mcg/kg/min (02/12/22 0600)   norepinephrine (LEVOPHED) Adult infusion 5 mcg/min (02/12/22 0600)    PRN Medications: sodium chloride, acetaminophen,  guaiFENesin-dextromethorphan, ondansetron (ZOFRAN) IV, sodium chloride flush, sodium chloride flush   Assessment/Plan   1. Acute on Chronic Biventricular Heart Failure  - due to NICM - suspect due to HTN and OSA +/- ETOH - cath 03/2013 no CAD - Echo 11/20 EF 30-35% - Echo 4/21 EF 20-25% moderate RV dysfunction (worse since previous) - Personally reviewed - TEE on 04/18/20. Procedure c/b by severe respiratory distress and laryngospasm. LVEF 25% RV moderately HK. Aortic valve not interrogated completely due to respiratory distress. AI appeared to be moderate.  - cMRI 8/21 EF 24% Moderate AI. No LGE or infiltrative process. - Echo 03/22/21 EF 25-30% mod AI Moderate RV dysfunction.  - Echo 02/03/2022 EF < 20% with mild LV dilation, RV severely HK with moderate dilation and D-shaped septum, AI moderate to severe.  - Suspect CM related to HTN +/- ETOH. AI may be contributing but severity of biventricular dysfunction out of proportion to AI and LV not overly dilated. - NYHA IIIB-IV with marked volume overload.  - Co-ox 54% initially, now up 84% on milrinone 0.25 + 5 NE.  - Decrease NE to 3 today. Continue midodrine 5 mg TID for now, can likely eventually wean off. - CVP 8-9 today. Will give 80 mg lasix IV once today - Off MRA + ARNI with AKI + hypotension - On carvedilol 50 mg bid at home, now on hold - Off hydralazine/imdur with hypotension - Eventually restart Farxiga 10 mg daily.  - Anticipate RHC soon  - Ideally will need transplant but is still smoking and using THC. Will need to try and bridge for 6 months to possibly get him to transplant. LVAD will be difficult, RV is severely dysfunctional.  Will involve VAD team to start discussions regarding advanced therapies.   2. Moderate-severe aortic regurgitation - Visually mild to moderate on TEE 4/21 but P1/2 time 215 msec suggesting severe AI.  - TEE on 04/18/20. Procedure c/b by severe respiratory distress and laryngospasm. LVEF 25% RV moderately  HK. Aortic valve not interrogated completely due to respiratory distress. AI appeared to be moderate - TEE reviewed with echo team felt to be moderate at worst. - Mild AI on echo 3/22. - Moderate to severe AI on echo 01/26/2022.  On Dr. Clayborne Dana review, LV is mildly dilated. Suspect AI is not cause of cardiomyopathy but is likely making symptoms worse.  - Diurese, afterload reduce.    3. Hypertension - With hypotension yesterday. On NE + midodrine. See above.   4. OSA on CPAP - Fully compliant w/ CPAP.  -  Follows with Dr. Radford Pax.   5. Obesity. -Body mass index is 35   6. Tobacco use/ETOH - Says he stopped drinking - Continues to smoke, encouraged to quit.    7. ? Fatty Liver - Reports recent diagnosis at [facility], records not available. - CMET 10/22 ok.  8. AKI  - Scr bump from 1.3>>1.9. improved to 1.56 today  - Suspect due to hypotensive episode yesterday + diarrhea +cardiorenal  - support CO w/ NE + milrinone - avoid hypotension  - CVP 8-9. IV lasix today as above   9. Polyarticular Gout - On 3 day prednisone burst - Uric acid only 6 but notes improvement in pain with steroids  Length of Stay: 4  FINCH, LINDSAY N, PA-C  02/12/2022, 8:26 AM  Advanced Heart Failure Team Pager [phone] (M-F; 7a - 5p)  Please contact [facility] for night-coverage after hours (5p -7a ) and weekends on [email or web address]   Agree with above.   Remains on NE and milrinone. Co-ox improved. Hypotension resolved. Breathing better. Scr improving. Rhythm stable   General:  Sitting up in bed . No resp difficulty HEENT: normal Neck: supple. JVP elevated Carotids 2+ bilat; no bruits. No lymphadenopathy or thryomegaly appreciated. Cor: PMI laterally displaced. Regular tachy +s3  Lungs: clear Abdomen: soft, nontender, nondistended. No hepatosplenomegaly. No bruits or masses. Good bowel sounds. Extremities: no cyanosis, clubbing, rash, edema Neuro: alert & orientedx3, cranial nerves grossly  intact. moves all 4 extremities w/o difficulty. Affect pleasant  He has end-stage biventricular HF. Now inotrope dependent with NYHA IV symptoms. Not transplant candidate due to tobacco use. Will have VAD team see today for possible VAD though RV function is markedly depressed on echo. Will plan R/L cath later this week.   Agree with IV alsix today.   CRITICAL CARE Performed by: [name]  Total critical care time: 45 minutes  Critical care time was exclusive of separately billable procedures and treating other patients.  Critical care was necessary to treat or prevent imminent or life-threatening deterioration.  Critical care was time spent personally by me (independent of midlevel providers or residents) on the following activities: development of treatment plan with patient and/or surrogate as well as nursing, discussions with consultants, evaluation of patient's response to treatment, examination of patient, obtaining history from patient or surrogate, ordering and performing treatments and interventions, ordering and review of laboratory studies, ordering and review of radiographic studies, pulse oximetry and re-evaluation of patient's condition.  [name], MD  9:22 AM

## 2022-02-12 NOTE — Plan of Care (Signed)
Cross Cover Note   Notified by nursing SBPs remaining consistently in the 140s, occasionally up to 150s. Given improving to now elevated Bps in setting of acute on chronic BiV Heart Failure, decreased NE from 3 to 2 for afterload reduction.   Lorenda Cahill, MD

## 2022-02-12 NOTE — Progress Notes (Signed)
[facility] EDUCATION NOTE:    Patient confirms he spoke with Dr. Gala Romney about need for VAD evaluation. Patient says he will "do anything to stay here"; mainly for his mother's sake.               Initial VAD teaching started with patient; he asked that we wait for detailed education when his mother will be here in the morning. Agreed to same and plan for 9:00 am education tomorrow morning with patient and his mother.   VAD educational packet including "Understanding Your Options with Advanced Heart Failure", "[facility] Patient Agreement for VAD Evaluation and Potential Implantation" consent, and [employer] "Heartmate 3 Left Ventricular Device (LVAD) Patient Guide", Heartmate 3 Left Ventricular Assist System Patient Education Program DVD", "[place] HM III Patient Education", "[place] Mechanical Circulatory Support Program", and "Decision Aids for Left Ventricular Assist Device" left at bedside for continued reference.  Pt identified his mother as his primary caregiver.  Explained that LVAD can be implanted for two indications in the setting of advanced left ventricular heart failure treatment:  Bridge to transplant - used for patients who cannot safely wait for heart transplant without this device.  Or    Destination therapy - used for patients until end of life or recovery of heart function.  Patient acknowledges that the indication at this point in time for LVAD therapy would be for Destination Therapy due to current smoking status.                                          Hessie Diener RN, VAD Coordinator  Office: [phone] 24/7 VAD Pager: [phone]

## 2022-02-13 ENCOUNTER — Inpatient Hospital Stay (HOSPITAL_COMMUNITY): Payer: 59

## 2022-02-13 DIAGNOSIS — I5023 Acute on chronic systolic (congestive) heart failure: Secondary | ICD-10-CM | POA: Diagnosis not present

## 2022-02-13 LAB — BASIC METABOLIC PANEL
Anion gap: 10 (ref 5–15)
BUN: 26 mg/dL — ABNORMAL HIGH (ref 6–20)
CO2: 23 mmol/L (ref 22–32)
Calcium: 9 mg/dL (ref 8.9–10.3)
Chloride: 100 mmol/L (ref 98–111)
Creatinine, Ser: 1.32 mg/dL — ABNORMAL HIGH (ref 0.61–1.24)
GFR, Estimated: >60 mL/min (ref >60)
Glucose, Bld: 102 mg/dL — ABNORMAL HIGH (ref 70–99)
Potassium: 4.4 mmol/L (ref 3.5–5.1)
Sodium: 133 mmol/L — ABNORMAL LOW (ref 135–145)

## 2022-02-13 LAB — T4, FREE: Free T4: 0.99 ng/dL (ref 0.61–1.12)

## 2022-02-13 LAB — PROTIME-INR
INR: 1 (ref 0.8–1.2)
Prothrombin Time: 13.5 seconds (ref 11.4–15.2)

## 2022-02-13 LAB — HEPATITIS B SURFACE ANTIGEN: Hepatitis B Surface Ag: NONREACTIVE

## 2022-02-13 LAB — COOXEMETRY PANEL
Carboxyhemoglobin: 1.7 % — ABNORMAL HIGH (ref 0.5–1.5)
Methemoglobin: 0.7 % (ref 0.0–1.5)
O2 Saturation: 74.1 %
Total hemoglobin: 18.7 g/dL — ABNORMAL HIGH (ref 12.0–16.0)

## 2022-02-13 LAB — LIPID PANEL
Cholesterol: 172 mg/dL (ref 0–200)
HDL: 56 mg/dL (ref >40)
LDL Cholesterol: 96 mg/dL (ref 0–99)
Total CHOL/HDL Ratio: 3.1 RATIO
Triglycerides: 100 mg/dL (ref <150)
VLDL: 20 mg/dL (ref 0–40)

## 2022-02-13 LAB — CBC
HCT: 56.2 % — ABNORMAL HIGH (ref 39.0–52.0)
Hemoglobin: 18.1 g/dL — ABNORMAL HIGH (ref 13.0–17.0)
MCH: 29.9 pg (ref 26.0–34.0)
MCHC: 32.2 g/dL (ref 30.0–36.0)
MCV: 92.7 fL (ref 80.0–100.0)
Platelets: 239 10*3/uL (ref 150–400)
RBC: 6.06 MIL/uL — ABNORMAL HIGH (ref 4.22–5.81)
RDW: 14.5 % (ref 11.5–15.5)
WBC: 9.5 10*3/uL (ref 4.0–10.5)
nRBC: 0 % (ref 0.0–0.2)

## 2022-02-13 LAB — RAPID URINE DRUG SCREEN, HOSP PERFORMED
Amphetamines: NOT DETECTED
Barbiturates: NOT DETECTED
Benzodiazepines: NOT DETECTED
Cocaine: NOT DETECTED
Opiates: NOT DETECTED
Tetrahydrocannabinol: NOT DETECTED

## 2022-02-13 LAB — PREALBUMIN: Prealbumin: 9.5 mg/dL — ABNORMAL LOW (ref 18–38)

## 2022-02-13 LAB — HEMOGLOBIN A1C
Hgb A1c MFr Bld: 6.2 % — ABNORMAL HIGH (ref 4.8–5.6)
Mean Plasma Glucose: 131 mg/dL

## 2022-02-13 LAB — MAGNESIUM: Magnesium: 2.4 mg/dL (ref 1.7–2.4)

## 2022-02-13 LAB — HEPATITIS B CORE ANTIBODY, IGM: Hep B C IgM: NONREACTIVE

## 2022-02-13 LAB — HEPATITIS B SURFACE ANTIBODY,QUALITATIVE: Hep B S Ab: NONREACTIVE

## 2022-02-13 LAB — ANTITHROMBIN III: AntiThromb III Func: 93 % (ref 75–120)

## 2022-02-13 LAB — HEPATITIS C ANTIBODY: HCV Ab: NONREACTIVE

## 2022-02-13 MED ORDER — ASPIRIN 81 MG PO CHEW
81.0000 mg | CHEWABLE_TABLET | ORAL | Status: AC
  Administered 2022-02-14: 81 mg via ORAL
  Filled 2022-02-13: qty 1

## 2022-02-13 MED ORDER — IOHEXOL 9 MG/ML PO SOLN
ORAL | Status: AC
  Administered 2022-02-13: 500 mL
  Filled 2022-02-13: qty 1000

## 2022-02-13 MED ORDER — DAPAGLIFLOZIN PROPANEDIOL 10 MG PO TABS
10.0000 mg | ORAL_TABLET | Freq: Every day | ORAL | Status: DC
  Administered 2022-02-13 – 2022-02-18 (×6): 10 mg via ORAL
  Filled 2022-02-13 (×7): qty 1

## 2022-02-13 MED ORDER — MIDODRINE HCL 5 MG PO TABS
2.5000 mg | ORAL_TABLET | Freq: Three times a day (TID) | ORAL | Status: DC
  Administered 2022-02-13 – 2022-02-15 (×6): 2.5 mg via ORAL
  Filled 2022-02-13 (×6): qty 1

## 2022-02-13 MED ORDER — DIGOXIN 125 MCG PO TABS
0.1250 mg | ORAL_TABLET | Freq: Every day | ORAL | Status: DC
  Administered 2022-02-13 – 2022-02-21 (×9): 0.125 mg via ORAL
  Filled 2022-02-13 (×9): qty 1

## 2022-02-13 MED ORDER — SODIUM CHLORIDE 0.9% FLUSH: 3.0000 mL | INTRAVENOUS | Status: DC | PRN

## 2022-02-13 MED ORDER — SODIUM CHLORIDE 0.9 % IV SOLN: 250.0000 mL | INTRAVENOUS | Status: DC | PRN

## 2022-02-13 MED ORDER — POTASSIUM CHLORIDE CRYS ER 20 MEQ PO TBCR
20.0000 meq | EXTENDED_RELEASE_TABLET | Freq: Once | ORAL | Status: AC
  Administered 2022-02-13: 20 meq via ORAL
  Filled 2022-02-13: qty 1

## 2022-02-13 MED ORDER — PROSOURCE PLUS PO LIQD
30.0000 mL | Freq: Two times a day (BID) | ORAL | Status: DC
  Administered 2022-02-14 – 2022-02-24 (×16): 30 mL via ORAL
  Filled 2022-02-13 (×16): qty 30

## 2022-02-13 MED ORDER — SODIUM CHLORIDE 0.9% FLUSH: 3.0000 mL | Freq: Two times a day (BID) | INTRAVENOUS | Status: DC

## 2022-02-13 MED ORDER — FUROSEMIDE 10 MG/ML IJ SOLN
80.0000 mg | Freq: Once | INTRAMUSCULAR | Status: AC
  Administered 2022-02-13: 80 mg via INTRAVENOUS
  Filled 2022-02-13: qty 8

## 2022-02-13 MED ORDER — SODIUM CHLORIDE 0.9 % IV SOLN: INTRAVENOUS | Status: DC

## 2022-02-13 NOTE — Plan of Care (Addendum)
Rested approx 3 hrs on CPAP. States feeling much better. Continued education with pt/mom on heart failure and LVAD workup. Pt optimistic and ready for meeting with LVAD coordinator this morning.   Remains on Milrinone gtt, levo gtt decr per order d/t incr SBP, BP stable. Coox 74.1. CVP 8-10. Wt down 0.7 kg.   Wore CPAP for approx 3 hr. HFNC was titrated up for decr sats, likely reading issue as improved with change of probe (despite good pleth throughout shift), HFNC now down to 4 lpm.   SR/ST, PVCs, episode of narrow complex tachycardia 130-150 while sleeping. BP stable during episode, lasted approx 10 min. Increased ectopy noted, K stable on am labs.   Voiding (1 unmeasured episode at beginning of shift, reinforced importance of strictly measuring voids and pt compliant). UO approx 3 L (+ 2 unmeasured) for 24 hr.   Problem: Education: Goal: Knowledge of General Education information will improve Description: Including pain rating scale, medication(s)/side effects and non-pharmacologic comfort measures Outcome: Progressing   Problem: Health Behavior/Discharge Planning: Goal: Ability to manage health-related needs will improve Outcome: Progressing   Problem: Clinical Measurements: Goal: Ability to maintain clinical measurements within normal limits will improve Outcome: Progressing Goal: Will remain free from infection Outcome: Progressing Goal: Respiratory complications will improve Outcome: Progressing Goal: Cardiovascular complication will be avoided Outcome: Progressing   Problem: Activity: Goal: Risk for activity intolerance will decrease Outcome: Progressing   Problem: Nutrition: Goal: Adequate nutrition will be maintained Outcome: Progressing   Problem: Coping: Goal: Level of anxiety will decrease Outcome: Progressing   Problem: Elimination: Goal: Will not experience complications related to bowel motility Outcome: Progressing Goal: Will not experience complications  related to urinary retention Outcome: Progressing   Problem: Pain Managment: Goal: General experience of comfort will improve Outcome: Progressing   Problem: Safety: Goal: Ability to remain free from injury will improve Outcome: Progressing   Problem: Skin Integrity: Goal: Risk for impaired skin integrity will decrease Outcome: Progressing

## 2022-02-13 NOTE — Progress Notes (Signed)
Pt has scan at 2330. Will wait to place on Cpap when pt returns to room afterwards. No resp distress noted.

## 2022-02-13 NOTE — Progress Notes (Signed)
CSW met at bedside with patient and mother. CSW discussed briefly the LVAD psychosocial assessment and will tentatively meet tomorrow at bedside to complete. Raquel Sarna, [place], [place]

## 2022-02-13 NOTE — Progress Notes (Signed)
[facility] EDUCATION NOTE:   °             °VAD evaluation consent reviewed and signed by patient and designated caregiver, Colin Walton (mother). Initial VAD teaching completed with pt and caregiver.  ° °Colin Brennan, LCSW met briefly with patient and his mother. Will schedule complete social evaluation for tomorrow.  ° °Both have reviewed the teaching packet left at bedside yesterday. All questions answered regarding VAD implant, hospital stay, and what to expect when discharged home living with a heart pump. Pt identified his mother as his primary caregiver should he be deemed appropriate for HM III LVAD.  Explained need for 24/7 care when pt is discharged home due to sternal precautions, adaptation to living on support, emotional support, consistent and meticulous exit site care and management, medication adherence and high volume of follow up visits with the VAD Clinic after discharge; both pt and caregiver verbalized understanding of above.  ° °Explained that LVAD can be implanted for two indications in the setting of advanced left ventricular heart failure treatment: ° °Bridge to transplant - used for patients who cannot safely wait for heart transplant without this device. ° °Or °   °Destination therapy - used for patients until end of life or recovery of heart function. ° °Patient and caregiver(s) acknowledge that the indication at this point in time for LVAD therapy would be for Destination Therapy due to smoking status at time of admission.  ° °Provided brief equipment overview and demonstration with HeartMate III training loop including discussion on the following:   °a) power module   °b) system controller   °c) battery clips   °d) Batteries   ° °Reviewed and supplied a copy of home inspection check list stressing that only three pronged grounded power outlets can be used for VAD equipment. Patient and mother confirmed home has electrical outlets that will support the equipment along with access working  telephone. Patient lives in second floor apartment but feels he would stay with his mom at time of hospital discharge. She lives in one level dwelling.  ° °Identified the following lifestyle modifications while living on MCS:   °1. No driving for at least six weeks and then only if doctor gives permission to do so.   °2. No tub baths while pump implanted, and shower only when doctor gives permission.   °3. No swimming or submersion in water while implanted with pump.   °4. Exit site care including dressing changes, monitoring for infection, and importance of keeping percutaneous lead stabilized at all times.  °  °Extended the option to have one of our current patients and caregiver come to talk with them about living on support to assist with decision making. Both agreed to same.  ° °Reviewed pictures of VAD drive line, site care, dressing changes, and drive line stabilization including securement attachment device and abdominal binder. Discussed with pt and family that they will be required to purchase dressing supplies as long as patient has the VAD in place.  ° °Reinforced need for 24 hour/7 day week caregivers; pt designated his mother as caregiver. He will also need to abide by sternal precautions with no lifting >10lbs, pushing, pulling and will need assistance with adapting to new life style with VAD equipment and care.  ° °Intermacs patient survival statistics through September 2022 reviewed with patient and caregiver as follows: ° ° ° °                                            °  The patient understands that from this discussion it does not mean that they will receive the device, but that depends on an extensive evaluation process. The patient is aware of the fact that if at anytime they want to stop the evaluation process they can. ° °All questions have been answered at this time and contact information was provided should they encounter any further questions. They are both agreeable at this time to the  evaluation process and will move forward.  ° °Contact information given for VAD Coordinator if any further questions arise.  ° °Colin Reece RN, VAD Coordinator  °Office: [phone] °24/7 VAD Pager: [phone]  °

## 2022-02-13 NOTE — Progress Notes (Addendum)
Patient ID: Colin Walton, male   DOB: 01/22/1971, 51 y.o.   MRN: [id number] °  ° ° Advanced Heart Failure Rounding Note ° °PCP-Cardiologist: Christopher McAlhany, MD  ° °Subjective:   ° °02/20: NE and midodrine added d/t hypotension and progressive AKI. Lasix gtt discontinued.  ° °Co-ox 74% on milrinone 0.25 + NE 2 ° °CVP 10. 3L UOP + unmeasured voids overnight ° °Scr 1.9>1.56>1.32 ° °Prealbumin 9.5 ° °Multiple short runs of nonsustained VT overnight up to 13 beats, 10-15 minutes narrow complex tachycardia with rate up to 150s ° °Feels great. No dyspnea or CP.  ° °Objective:   °Weight Range: °102.4 kg °Body mass index is 35.36 kg/m².  ° °Vital Signs:   °Temp:  [97.4 °F (36.3 °C)-98.3 °F (36.8 °C)] 98.1 °F (36.7 °C) (02/22 0400) °Pulse Rate:  [71-109] 93 (02/22 0700) °Resp:  [10-24] 18 (02/22 0700) °BP: (86-152)/(51-107) 149/68 (02/22 0700) °SpO2:  [87 %-98 %] 93 % (02/22 0700) °FiO2 (%):  [40 %] 40 % (02/22 0008) °Weight:  [102.4 kg] 102.4 kg (02/22 0400) °Last BM Date : 02/13/22 ° °Weight change: °Filed Weights  ° 02/11/22 0415 02/12/22 0500 02/13/22 0400  °Weight: 102.9 kg 103.1 kg 102.4 kg  ° ° °Intake/Output:  ° °Intake/Output Summary (Last 24 hours) at 02/13/2022 0737 °Last data filed at 02/13/2022 0700 °Gross per 24 hour  °Intake 2645.88 ml  °Output 2975 ml  °Net -329.12 ml  °  ° ° °Physical Exam  °  °CVP 10 °General:  No distress. Sitting up in bed. °HEENT: normal °Neck: supple. JVP 10 cm. Carotids 2+ bilat; no bruits.  °Cor: PMI nondisplaced. Regular rate & rhythm. No rubs, gallops or murmurs. °Lungs: clear °Abdomen: soft, nontender, nondistended.  °Extremities: no cyanosis, clubbing, rash, edema, + RUE PICC °Neuro: alert & orientedx3, cranial nerves grossly intact. moves all 4 extremities w/o difficulty. Affect pleasant ° ° ° °Telemetry  ° °Sinus rhythm/sinus tach 90s-100s, multiple runs NSVT up to 13 beats, 1 10-15 minute run narrow complex tachycardia with rate up to 150s ° ° °Labs  °  °CBC °Recent Labs  °   02/12/22 °0505 02/13/22 °0358  °WBC 9.1 9.5  °HGB 17.6* 18.1*  °HCT 55.2* 56.2*  °MCV 92.0 92.7  °PLT 231 239  ° °Basic Metabolic Panel °Recent Labs  °  02/12/22 °0505 02/13/22 °0358  °NA 133* 133*  °K 4.6 4.4  °CL 100 100  °CO2 24 23  °GLUCOSE 117* 102*  °BUN 25* 26*  °CREATININE 1.56* 1.32*  °CALCIUM 8.9 9.0  ° °Liver Function Tests °No results for input(s): AST, ALT, ALKPHOS, BILITOT, PROT, ALBUMIN in the last 72 hours. ° °No results for input(s): LIPASE, AMYLASE in the last 72 hours. °Cardiac Enzymes °No results for input(s): CKTOTAL, CKMB, CKMBINDEX, TROPONINI in the last 72 hours. ° °BNP: °BNP (last 3 results) °Recent Labs  °  03/22/21 °1106 02/02/2022 °1649 02/11/22 °0345  °BNP 995.8* 1,303.2* 166.8*  ° ° °ProBNP (last 3 results) °No results for input(s): PROBNP in the last 8760 hours. ° ° °D-Dimer °No results for input(s): DDIMER in the last 72 hours. °Hemoglobin A1C °No results for input(s): HGBA1C in the last 72 hours. °Fasting Lipid Panel °Recent Labs  °  02/13/22 °0358  °CHOL 172  °HDL 56  °LDLCALC 96  °TRIG 100  °CHOLHDL 3.1  ° °Thyroid Function Tests °No results for input(s): TSH, T4TOTAL, T3FREE, THYROIDAB in the last 72 hours. ° °Invalid input(s): FREET3 ° ° °Other results: ° ° °Imaging  ° ° °  No results found.   Medications:     Scheduled Medications:  allopurinol  300 mg Oral Daily   atorvastatin  10 mg Oral Daily   chlorhexidine  15 mL Mouth Rinse BID   Chlorhexidine Gluconate Cloth  6 each Topical Daily   enoxaparin (LOVENOX) injection  50 mg Subcutaneous Q24H   melatonin  3 mg Oral QHS   midodrine  5 mg Oral TID WC   sodium chloride flush  10-40 mL Intracatheter Q12H   sodium chloride flush  3 mL Intravenous Q12H    Infusions:  sodium chloride     milrinone 0.25 mcg/kg/min (02/13/22 0700)   norepinephrine (LEVOPHED) Adult infusion 2 mcg/min (02/13/22 0700)    PRN Medications: sodium chloride, acetaminophen, guaiFENesin-dextromethorphan, ondansetron (ZOFRAN) IV, sodium  chloride flush, sodium chloride flush   Assessment/Plan   1. Acute on Chronic Biventricular Heart Failure  - due to NICM - suspect due to HTN and OSA +/- ETOH - cath 03/2013 no CAD - Echo 11/20 EF 30-35% - Echo 4/21 EF 20-25% moderate RV dysfunction (worse since previous) - Personally reviewed - TEE on 04/18/20. Procedure c/b by severe respiratory distress and laryngospasm. LVEF 25% RV moderately HK. Aortic valve not interrogated completely due to respiratory distress. AI appeared to be moderate.  - cMRI 8/21 EF 24% Moderate AI. No LGE or infiltrative process. - Echo 03/22/21 EF 25-30% mod AI Moderate RV dysfunction.  - Echo 02/01/2022 EF < 20% with mild LV dilation, RV severely HK with moderate dilation and D-shaped septum, AI moderate to severe.  - Suspect CM related to HTN +/- ETOH. AI may be contributing but severity of biventricular dysfunction out of proportion to AI and LV not overly dilated. - NYHA IIIB-IV with marked volume overload.  - Co-ox 54% initially, now up 74% on milrinone 0.25 + NE 2 - ? Decreasing milrinone with stable co-ox and runs NSVT  - Continue midodrine 5 mg TID - CVP 10 today. Continue IV lasix 80 mg daily - Restart digoxin 0.125 mg daily - Restart Farxiga 10 mg dailiy - Off MRA + ARNI with AKI + hypotension - On carvedilol 50 mg bid at home, now on hold - Off hydralazine/imdur with hypotension  - Anticipate RHC soon  - Ideally will need transplant but is still smoking and using THC. Will need to try and bridge for 6 months to possibly get him to transplant. LVAD will be difficult, RV is severely dysfunctional.  VAD team starting discussions regarding advanced therapies.   2. Moderate-severe aortic regurgitation - Visually mild to moderate on TEE 4/21 but P1/2 time 215 msec suggesting severe AI.  - TEE on 04/18/20. Procedure c/b by severe respiratory distress and laryngospasm. LVEF 25% RV moderately HK. Aortic valve not interrogated completely due to respiratory  distress. AI appeared to be moderate - TEE reviewed with echo team felt to be moderate at worst. - Mild AI on echo 3/22. - Moderate to severe AI on echo 02/05/2022.  On Dr. Clayborne Dana review, LV is mildly dilated. Suspect AI is not cause of cardiomyopathy but is likely making symptoms worse.  - Diurese, afterload reduce.    3. Hypertension - With hypotension 02/20. On NE + midodrine. See above.   4. OSA on CPAP - Fully compliant w/ CPAP.  - Follows with Dr. Radford Pax.   5. Obesity. -Body mass index is 35   6. Tobacco use/ETOH - Says he stopped drinking - Continues to smoke, encouraged to quit.    7. ? Fatty  Liver - Reports recent diagnosis at [facility], records not available. - CMET 10/22 ok.  8. AKI  - Scr bump from 1.3>>1.9. improved to 1.32 today - Suspect due to hypotensive episode yesterday + diarrhea +cardiorenal  - improved with BP support  - CVP 10. IV lasix today as above   9. Polyarticular Gout - On 3 day prednisone burst - Uric acid only 6 but notes improvement in pain with steroids  10. Nonsustained VT - Multiple runs up to 13 beats on tele - Milrinone may be contributing, ? going down on milrinone - K 4.4. Check magnesium - May need amio  11. Narrow complex tachycardia - 10-15 minute run on tele this am, appeared regular and rate up to 150s. ? Atrial tach vs atrial flutter  12. Malnutrition - Prealbumin 9.5 - Consult RD  Mobilize today   Length of Stay: 5  FINCH, [place], PA-C  02/13/2022, 7:37 AM  Advanced Heart Failure Team Pager [phone] (M-F; 7a - 5p)  Please contact [facility] for night-coverage after hours (5p -7a ) and weekends on [email or web address]  Agree with above.   Remains on milrinone and NE. Co-ox ok. Had some NSVT overnight  Denies CP or SOB.  General:  Sitting up in bed No resp difficulty HEENT: normal Neck: supple. no JVD. Carotids 2+ bilat; no bruits. No lymphadenopathy or thryomegaly appreciated. Cor: PMI laterally  displaced. Regular tachy + s3 . Lungs: clear Abdomen: obese soft, nontender, nondistended. No hepatosplenomegaly. No bruits or masses. Good bowel sounds. Extremities: no cyanosis, clubbing, rash, edema Neuro: alert & orientedx3, cranial nerves grossly intact. moves all 4 extremities w/o difficulty. Affect pleasant  Remains very tenuous but improved on inotropes. I think he needs advanced therapies. Ideally would go straight to transplant but given ongoing tobacco use I think he will need VAD now as bridge if RV can tolerate so we don't miss his window.   Lamonte Sakai been seen by VAD team. Will plan R/L cath tomorrow.   CRITICAL CARE Performed by: [name]  Total critical care time: 35 minutes  Critical care time was exclusive of separately billable procedures and treating other patients.  Critical care was necessary to treat or prevent imminent or life-threatening deterioration.  Critical care was time spent personally by me (independent of midlevel providers or residents) on the following activities: development of treatment plan with patient and/or surrogate as well as nursing, discussions with consultants, evaluation of patient's response to treatment, examination of patient, obtaining history from patient or surrogate, ordering and performing treatments and interventions, ordering and review of laboratory studies, ordering and review of radiographic studies, pulse oximetry and re-evaluation of patient's condition.  [name], MD  11:27 AM

## 2022-02-13 NOTE — Progress Notes (Signed)
Wore CPAP for approx 3 hr last night, then pt was awake and requested to be placed back on HFNC. Increased Oxygen needs overnight d/t borderline sats, good pleth on monitor. Pt denies SOB/dyspnea, ambulates to BR without dyspnea/incr WOB. Instructed pt on IS/TCDB, am chest xray.   O2 sat probe changed and reading improved (despite similar pleth), with accurate readings able to wean back to HFNC 6 lpm. Expect that documented decr sats during shift were inaccurate.

## 2022-02-13 NOTE — H&P (View-Only) (Signed)
Patient ID: Colin Walton, male   DOB: 1971/07/06, 51 y.o.   MRN: [id number]     Advanced Heart Failure Rounding Note  PCP-Cardiologist: Lauree Chandler, MD   Subjective:    02/20: NE and midodrine added d/t hypotension and progressive AKI. Lasix gtt discontinued.   Co-ox 74% on milrinone 0.25 + NE 2  CVP 10. 3L UOP + unmeasured voids overnight  Scr 1.9>1.56>1.32  Prealbumin 9.5  Multiple short runs of nonsustained VT overnight up to 13 beats, 10-15 minutes narrow complex tachycardia with rate up to 150s  Feels great. No dyspnea or CP.   Objective:   Weight Range: 102.4 kg Body mass index is 35.36 kg/m.   Vital Signs:   Temp:  [97.4 F (36.3 C)-98.3 F (36.8 C)] 98.1 F (36.7 C) (02/22 0400) Pulse Rate:  [71-109] 93 (02/22 0700) Resp:  [10-24] 18 (02/22 0700) BP: (86-152)/(51-107) 149/68 (02/22 0700) SpO2:  [87 %-98 %] 93 % (02/22 0700) FiO2 (%):  [40 %] 40 % (02/22 0008) Weight:  [102.4 kg] 102.4 kg (02/22 0400) Last BM Date : 02/13/22  Weight change: Filed Weights   02/11/22 0415 02/12/22 0500 02/13/22 0400  Weight: 102.9 kg 103.1 kg 102.4 kg    Intake/Output:   Intake/Output Summary (Last 24 hours) at 02/13/2022 0737 Last data filed at 02/13/2022 0700 Gross per 24 hour  Intake 2645.88 ml  Output 2975 ml  Net -329.12 ml      Physical Exam    CVP 10 General:  No distress. Sitting up in bed. HEENT: normal Neck: supple. JVP 10 cm. Carotids 2+ bilat; no bruits.  Cor: PMI nondisplaced. Regular rate & rhythm. No rubs, gallops or murmurs. Lungs: clear Abdomen: soft, nontender, nondistended.  Extremities: no cyanosis, clubbing, rash, edema, + RUE PICC Neuro: alert & orientedx3, cranial nerves grossly intact. moves all 4 extremities w/o difficulty. Affect pleasant    Telemetry   Sinus rhythm/sinus tach 90s-100s, multiple runs NSVT up to 13 beats, 1 10-15 minute run narrow complex tachycardia with rate up to 150s   Labs    CBC Recent Labs     02/12/22 0505 02/13/22 0358  WBC 9.1 9.5  HGB 17.6* 18.1*  HCT 55.2* 56.2*  MCV 92.0 92.7  PLT 231 [id number]   Basic Metabolic Panel Recent Labs    02/12/22 0505 02/13/22 0358  NA 133* 133*  K 4.6 4.4  CL 100 100  CO2 24 23  GLUCOSE 117* 102*  BUN 25* 26*  CREATININE 1.56* 1.32*  CALCIUM 8.9 9.0   Liver Function Tests No results for input(s): AST, ALT, ALKPHOS, BILITOT, PROT, ALBUMIN in the last 72 hours.  No results for input(s): LIPASE, AMYLASE in the last 72 hours. Cardiac Enzymes No results for input(s): CKTOTAL, CKMB, CKMBINDEX, TROPONINI in the last 72 hours.  BNP: BNP (last 3 results) Recent Labs    03/22/21 1106 01/29/2022 1649 02/11/22 0345  BNP 995.8* 1,303.2* 166.8*    ProBNP (last 3 results) No results for input(s): PROBNP in the last 8760 hours.   D-Dimer No results for input(s): DDIMER in the last 72 hours. Hemoglobin A1C No results for input(s): HGBA1C in the last 72 hours. Fasting Lipid Panel Recent Labs    02/13/22 0358  CHOL 172  HDL 56  LDLCALC 96  TRIG 100  CHOLHDL 3.1   Thyroid Function Tests No results for input(s): TSH, T4TOTAL, T3FREE, THYROIDAB in the last 72 hours.  Invalid input(s): FREET3   Other results:   Imaging  No results found.   Medications:     Scheduled Medications:  allopurinol  300 mg Oral Daily   atorvastatin  10 mg Oral Daily   chlorhexidine  15 mL Mouth Rinse BID   Chlorhexidine Gluconate Cloth  6 each Topical Daily   enoxaparin (LOVENOX) injection  50 mg Subcutaneous Q24H   melatonin  3 mg Oral QHS   midodrine  5 mg Oral TID WC   sodium chloride flush  10-40 mL Intracatheter Q12H   sodium chloride flush  3 mL Intravenous Q12H    Infusions:  sodium chloride     milrinone 0.25 mcg/kg/min (02/13/22 0700)   norepinephrine (LEVOPHED) Adult infusion 2 mcg/min (02/13/22 0700)    PRN Medications: sodium chloride, acetaminophen, guaiFENesin-dextromethorphan, ondansetron (ZOFRAN) IV, sodium  chloride flush, sodium chloride flush   Assessment/Plan   1. Acute on Chronic Biventricular Heart Failure  - due to NICM - suspect due to HTN and OSA +/- ETOH - cath 03/2013 no CAD - Echo 11/20 EF 30-35% - Echo 4/21 EF 20-25% moderate RV dysfunction (worse since previous) - Personally reviewed - TEE on 04/18/20. Procedure c/b by severe respiratory distress and laryngospasm. LVEF 25% RV moderately HK. Aortic valve not interrogated completely due to respiratory distress. AI appeared to be moderate.  - cMRI 8/21 EF 24% Moderate AI. No LGE or infiltrative process. - Echo 03/22/21 EF 25-30% mod AI Moderate RV dysfunction.  - Echo 01/24/2022 EF < 20% with mild LV dilation, RV severely HK with moderate dilation and D-shaped septum, AI moderate to severe.  - Suspect CM related to HTN +/- ETOH. AI may be contributing but severity of biventricular dysfunction out of proportion to AI and LV not overly dilated. - NYHA IIIB-IV with marked volume overload.  - Co-ox 54% initially, now up 74% on milrinone 0.25 + NE 2 - ? Decreasing milrinone with stable co-ox and runs NSVT  - Continue midodrine 5 mg TID - CVP 10 today. Continue IV lasix 80 mg daily - Restart digoxin 0.125 mg daily - Restart Farxiga 10 mg dailiy - Off MRA + ARNI with AKI + hypotension - On carvedilol 50 mg bid at home, now on hold - Off hydralazine/imdur with hypotension  - Anticipate RHC soon  - Ideally will need transplant but is still smoking and using THC. Will need to try and bridge for 6 months to possibly get him to transplant. LVAD will be difficult, RV is severely dysfunctional.  VAD team starting discussions regarding advanced therapies.   2. Moderate-severe aortic regurgitation - Visually mild to moderate on TEE 4/21 but P1/2 time 215 msec suggesting severe AI.  - TEE on 04/18/20. Procedure c/b by severe respiratory distress and laryngospasm. LVEF 25% RV moderately HK. Aortic valve not interrogated completely due to respiratory  distress. AI appeared to be moderate - TEE reviewed with echo team felt to be moderate at worst. - Mild AI on echo 3/22. - Moderate to severe AI on echo 02/17/2022.  On Dr. Clayborne Dana review, LV is mildly dilated. Suspect AI is not cause of cardiomyopathy but is likely making symptoms worse.  - Diurese, afterload reduce.    3. Hypertension - With hypotension 02/20. On NE + midodrine. See above.   4. OSA on CPAP - Fully compliant w/ CPAP.  - Follows with Dr. Radford Pax.   5. Obesity. -Body mass index is 35   6. Tobacco use/ETOH - Says he stopped drinking - Continues to smoke, encouraged to quit.    7. ? Fatty  Liver - Reports recent diagnosis at [facility], records not available. - CMET 10/22 ok.  8. AKI  - Scr bump from 1.3>>1.9. improved to 1.32 today - Suspect due to hypotensive episode yesterday + diarrhea +cardiorenal  - improved with BP support  - CVP 10. IV lasix today as above   9. Polyarticular Gout - On 3 day prednisone burst - Uric acid only 6 but notes improvement in pain with steroids  10. Nonsustained VT - Multiple runs up to 13 beats on tele - Milrinone may be contributing, ? going down on milrinone - K 4.4. Check magnesium - May need amio  11. Narrow complex tachycardia - 10-15 minute run on tele this am, appeared regular and rate up to 150s. ? Atrial tach vs atrial flutter  12. Malnutrition - Prealbumin 9.5 - Consult RD  Mobilize today   Length of Stay: 5  Walton, Eloy, PA-C  02/13/2022, 7:37 AM  Advanced Heart Failure Team Pager [phone] (M-F; 7a - 5p)  Please contact [facility] for night-coverage after hours (5p -7a ) and weekends on [email or web address]  Agree with above.   Remains on milrinone and NE. Co-ox ok. Had some NSVT overnight  Denies CP or SOB.  General:  Sitting up in bed No resp difficulty HEENT: normal Neck: supple. no JVD. Carotids 2+ bilat; no bruits. No lymphadenopathy or thryomegaly appreciated. Cor: PMI laterally  displaced. Regular tachy + s3 . Lungs: clear Abdomen: obese soft, nontender, nondistended. No hepatosplenomegaly. No bruits or masses. Good bowel sounds. Extremities: no cyanosis, clubbing, rash, edema Neuro: alert & orientedx3, cranial nerves grossly intact. moves all 4 extremities w/o difficulty. Affect pleasant  Remains very tenuous but improved on inotropes. I think he needs advanced therapies. Ideally would go straight to transplant but given ongoing tobacco use I think he will need VAD now as bridge if RV can tolerate so we don't miss his window.   Lamonte Sakai been seen by VAD team. Will plan R/L cath tomorrow.   CRITICAL CARE Performed by: [name]  Total critical care time: 35 minutes  Critical care time was exclusive of separately billable procedures and treating other patients.  Critical care was necessary to treat or prevent imminent or life-threatening deterioration.  Critical care was time spent personally by me (independent of midlevel providers or residents) on the following activities: development of treatment plan with patient and/or surrogate as well as nursing, discussions with consultants, evaluation of patient's response to treatment, examination of patient, obtaining history from patient or surrogate, ordering and performing treatments and interventions, ordering and review of laboratory studies, ordering and review of radiographic studies, pulse oximetry and re-evaluation of patient's condition.  [name], MD  11:27 AM

## 2022-02-14 ENCOUNTER — Inpatient Hospital Stay (HOSPITAL_COMMUNITY): Admission: AD | Disposition: E | Payer: Self-pay | Source: Ambulatory Visit | Attending: Internal Medicine

## 2022-02-14 ENCOUNTER — Inpatient Hospital Stay (HOSPITAL_COMMUNITY): Payer: 59

## 2022-02-14 ENCOUNTER — Other Ambulatory Visit (HOSPITAL_COMMUNITY): Payer: 59

## 2022-02-14 DIAGNOSIS — Z515 Encounter for palliative care: Secondary | ICD-10-CM

## 2022-02-14 DIAGNOSIS — I2510 Atherosclerotic heart disease of native coronary artery without angina pectoris: Secondary | ICD-10-CM

## 2022-02-14 DIAGNOSIS — Z01810 Encounter for preprocedural cardiovascular examination: Secondary | ICD-10-CM | POA: Diagnosis not present

## 2022-02-14 DIAGNOSIS — Z7189 Other specified counseling: Secondary | ICD-10-CM

## 2022-02-14 DIAGNOSIS — I5023 Acute on chronic systolic (congestive) heart failure: Secondary | ICD-10-CM | POA: Diagnosis not present

## 2022-02-14 HISTORY — PX: RIGHT/LEFT HEART CATH AND CORONARY ANGIOGRAPHY: CATH118266

## 2022-02-14 LAB — COOXEMETRY PANEL
Carboxyhemoglobin: 1.9 % — ABNORMAL HIGH (ref 0.5–1.5)
Methemoglobin: 0.8 % (ref 0.0–1.5)
O2 Saturation: 75.8 %
Total hemoglobin: 17.6 g/dL — ABNORMAL HIGH (ref 12.0–16.0)

## 2022-02-14 LAB — PULMONARY FUNCTION TEST
DL/VA % pred: 76 %
DL/VA: 3.43 ml/min/mmHg/L
DLCO cor % pred: 39 %
DLCO cor: 10.51 ml/min/mmHg
DLCO unc % pred: 41 %
DLCO unc: 11.26 ml/min/mmHg
FEF 25-75 Post: 1.14 L/sec
FEF 25-75 Pre: 0.85 L/sec
FEF2575-%Change-Post: 34 %
FEF2575-%Pred-Post: 36 %
FEF2575-%Pred-Pre: 27 %
FEV1-%Change-Post: 4 %
FEV1-%Pred-Post: 52 %
FEV1-%Pred-Pre: 50 %
FEV1-Post: 1.6 L
FEV1-Pre: 1.53 L
FEV1FVC-%Change-Post: 4 %
FEV1FVC-%Pred-Pre: 87 %
FEV6-%Change-Post: 0 %
FEV6-%Pred-Post: 58 %
FEV6-%Pred-Pre: 58 %
FEV6-Post: 2.16 L
FEV6-Pre: 2.16 L
FEV6FVC-%Change-Post: 0 %
FEV6FVC-%Pred-Post: 102 %
FEV6FVC-%Pred-Pre: 102 %
FVC-%Change-Post: 0 %
FVC-%Pred-Post: 57 %
FVC-%Pred-Pre: 57 %
FVC-Post: 2.18 L
FVC-Pre: 2.18 L
Post FEV1/FVC ratio: 73 %
Post FEV6/FVC ratio: 99 %
Pre FEV1/FVC ratio: 70 %
Pre FEV6/FVC Ratio: 99 %
RV % pred: 98 %
RV: 1.81 L
TLC % pred: 64 %
TLC: 4.11 L

## 2022-02-14 LAB — CBC
HCT: 54.8 % — ABNORMAL HIGH (ref 39.0–52.0)
Hemoglobin: 17.4 g/dL — ABNORMAL HIGH (ref 13.0–17.0)
MCH: 29.9 pg (ref 26.0–34.0)
MCHC: 31.8 g/dL (ref 30.0–36.0)
MCV: 94.3 fL (ref 80.0–100.0)
Platelets: 216 10*3/uL (ref 150–400)
RBC: 5.81 MIL/uL (ref 4.22–5.81)
RDW: 14.5 % (ref 11.5–15.5)
WBC: 8.2 10*3/uL (ref 4.0–10.5)
nRBC: 0 % (ref 0.0–0.2)

## 2022-02-14 LAB — POCT I-STAT EG7
Acid-Base Excess: 1 mmol/L (ref 0.0–2.0)
Acid-base deficit: 3 mmol/L — ABNORMAL HIGH (ref 0.0–2.0)
Bicarbonate: 23.5 mmol/L (ref 20.0–28.0)
Bicarbonate: 29.1 mmol/L — ABNORMAL HIGH (ref 20.0–28.0)
Calcium, Ion: 1.22 mmol/L (ref 1.15–1.40)
Calcium, Ion: 1.27 mmol/L (ref 1.15–1.40)
HCT: 44 % (ref 39.0–52.0)
HCT: 54 % — ABNORMAL HIGH (ref 39.0–52.0)
Hemoglobin: 15 g/dL (ref 13.0–17.0)
Hemoglobin: 18.4 g/dL — ABNORMAL HIGH (ref 13.0–17.0)
O2 Saturation: 68 %
O2 Saturation: 68 %
Potassium: 2.9 mmol/L — ABNORMAL LOW (ref 3.5–5.1)
Potassium: 4.2 mmol/L (ref 3.5–5.1)
Sodium: 138 mmol/L (ref 135–145)
Sodium: 145 mmol/L (ref 135–145)
TCO2: 25 mmol/L (ref 22–32)
TCO2: 31 mmol/L (ref 22–32)
pCO2, Ven: 44.7 mmHg (ref 44–60)
pCO2, Ven: 56.1 mmHg (ref 44–60)
pH, Ven: 7.322 (ref 7.25–7.43)
pH, Ven: 7.328 (ref 7.25–7.43)
pO2, Ven: 38 mmHg (ref 32–45)
pO2, Ven: 39 mmHg (ref 32–45)

## 2022-02-14 LAB — POCT I-STAT 7, (LYTES, BLD GAS, ICA,H+H)
Acid-Base Excess: 0 mmol/L (ref 0.0–2.0)
Bicarbonate: 26.1 mmol/L (ref 20.0–28.0)
Calcium, Ion: 1.09 mmol/L — ABNORMAL LOW (ref 1.15–1.40)
HCT: 53 % — ABNORMAL HIGH (ref 39.0–52.0)
Hemoglobin: 18 g/dL — ABNORMAL HIGH (ref 13.0–17.0)
O2 Saturation: 94 %
Potassium: 4 mmol/L (ref 3.5–5.1)
Sodium: 139 mmol/L (ref 135–145)
TCO2: 28 mmol/L (ref 22–32)
pCO2 arterial: 48.2 mmHg — ABNORMAL HIGH (ref 32–48)
pH, Arterial: 7.342 — ABNORMAL LOW (ref 7.35–7.45)
pO2, Arterial: 75 mmHg — ABNORMAL LOW (ref 83–108)

## 2022-02-14 LAB — BASIC METABOLIC PANEL
Anion gap: 9 (ref 5–15)
BUN: 27 mg/dL — ABNORMAL HIGH (ref 6–20)
CO2: 22 mmol/L (ref 22–32)
Calcium: 8.5 mg/dL — ABNORMAL LOW (ref 8.9–10.3)
Chloride: 101 mmol/L (ref 98–111)
Creatinine, Ser: 1.14 mg/dL (ref 0.61–1.24)
GFR, Estimated: >60 mL/min (ref >60)
Glucose, Bld: 97 mg/dL (ref 70–99)
Potassium: 4.9 mmol/L (ref 3.5–5.1)
Sodium: 132 mmol/L — ABNORMAL LOW (ref 135–145)

## 2022-02-14 LAB — LUPUS ANTICOAGULANT PANEL
DRVVT: 34.2 s (ref 0.0–47.0)
PTT Lupus Anticoagulant: 38.7 s (ref 0.0–43.5)

## 2022-02-14 LAB — MAGNESIUM: Magnesium: 2.4 mg/dL (ref 1.7–2.4)

## 2022-02-14 SURGERY — RIGHT/LEFT HEART CATH AND CORONARY ANGIOGRAPHY
Anesthesia: LOCAL

## 2022-02-14 MED ORDER — HEPARIN (PORCINE) IN NACL 1000-0.9 UT/500ML-% IV SOLN
INTRAVENOUS | Status: AC
  Filled 2022-02-14: qty 500

## 2022-02-14 MED ORDER — HEPARIN (PORCINE) IN NACL 1000-0.9 UT/500ML-% IV SOLN
INTRAVENOUS | Status: DC | PRN
Start: 2022-02-14 — End: 2022-02-14
  Administered 2022-02-14 (×2): 500 mL

## 2022-02-14 MED ORDER — IOHEXOL 350 MG/ML SOLN
INTRAVENOUS | Status: DC | PRN
  Administered 2022-02-14: 70 mL

## 2022-02-14 MED ORDER — HEPARIN SODIUM (PORCINE) 1000 UNIT/ML IJ SOLN
INTRAMUSCULAR | Status: AC
Start: 2022-02-14 — End: ?
  Filled 2022-02-14: qty 10

## 2022-02-14 MED ORDER — ADULT MULTIVITAMIN W/MINERALS CH
1.0000 | ORAL_TABLET | Freq: Every day | ORAL | Status: DC
  Administered 2022-02-14 – 2022-02-21 (×7): 1 via ORAL
  Filled 2022-02-14 (×7): qty 1

## 2022-02-14 MED ORDER — MIDAZOLAM HCL 2 MG/2ML IJ SOLN
INTRAMUSCULAR | Status: DC | PRN
  Administered 2022-02-14: 1 mg via INTRAVENOUS

## 2022-02-14 MED ORDER — FENTANYL CITRATE (PF) 100 MCG/2ML IJ SOLN
INTRAMUSCULAR | Status: AC
  Filled 2022-02-14: qty 2

## 2022-02-14 MED ORDER — HEPARIN SODIUM (PORCINE) 1000 UNIT/ML IJ SOLN
INTRAMUSCULAR | Status: DC | PRN
  Administered 2022-02-14: 5000 [IU] via INTRAVENOUS

## 2022-02-14 MED ORDER — FENTANYL CITRATE (PF) 100 MCG/2ML IJ SOLN
INTRAMUSCULAR | Status: DC | PRN
  Administered 2022-02-14: 25 ug via INTRAVENOUS

## 2022-02-14 MED ORDER — VERAPAMIL HCL 2.5 MG/ML IV SOLN: INTRAVENOUS | Status: DC | PRN

## 2022-02-14 MED ORDER — LIDOCAINE HCL (PF) 1 % IJ SOLN
INTRAMUSCULAR | Status: AC
  Filled 2022-02-14: qty 30

## 2022-02-14 MED ORDER — ALBUTEROL SULFATE (2.5 MG/3ML) 0.083% IN NEBU
2.5000 mg | INHALATION_SOLUTION | Freq: Once | RESPIRATORY_TRACT | Status: AC
  Administered 2022-02-14: 2.5 mg via RESPIRATORY_TRACT

## 2022-02-14 MED ORDER — MIDAZOLAM HCL 2 MG/2ML IJ SOLN
INTRAMUSCULAR | Status: AC
  Filled 2022-02-14: qty 2

## 2022-02-14 MED ORDER — VERAPAMIL HCL 2.5 MG/ML IV SOLN
INTRAVENOUS | Status: AC
Start: 2022-02-14 — End: ?
  Filled 2022-02-14: qty 2

## 2022-02-14 SURGICAL SUPPLY — 10 items
CATH 5FR JL3.5 JR4 ANG PIG MP (CATHETERS) ×1 IMPLANT
CATH BALLN WEDGE 5F 110CM (CATHETERS) ×1 IMPLANT
DEVICE RAD COMP TR BAND LRG (VASCULAR PRODUCTS) ×1 IMPLANT
GLIDESHEATH SLEND SS 6F .021 (SHEATH) ×1 IMPLANT
GUIDEWIRE .025 260CM (WIRE) ×1 IMPLANT
GUIDEWIRE INQWIRE 1.5J.035X260 (WIRE) IMPLANT
INQWIRE 1.5J .035X260CM (WIRE) ×2
PACK CARDIAC CATHETERIZATION (CUSTOM PROCEDURE TRAY) ×3 IMPLANT
SHEATH GLIDE SLENDER 4/5FR (SHEATH) ×1 IMPLANT
TRANSDUCER W/STOPCOCK (MISCELLANEOUS) ×3 IMPLANT

## 2022-02-14 NOTE — Progress Notes (Signed)
Met with pt briefly today to answer any questions he had about LVAD. Had a good discussion with pt concerning VAD surgery. Pt was given the VAD Ipad on 2H to view some videos about VAD and see pts who have LVADs. I discussed using his IS every hour, he assures me that he will do this. Pt aware that dental is coming to consult with him tomorrow and that most likely he will need some teeth extractions prior to VAD. Pt was informed that the VAD team will meet on Monday at our [facility] meeting to discuss his case and review all data. If the team decides Colin Walton is a go we are tentatively planning implant for Wednesday 03/09/2022, he will need his teeth extractions before we can proceed with implant.  ° °Sarah Herbert RN, BSN °VAD Coordinator °24/7 Pager [phone] ° °

## 2022-02-14 NOTE — Progress Notes (Addendum)
Patient ID: Colin Walton, male   DOB: [birth year], 51 y.o.   MRN: [id number]     Advanced Heart Failure Rounding Note  PCP-Cardiologist: Lauree Chandler, MD   Subjective:    Feels better. No CP or SOB.  On milrinone 0.125 and NE 2  Cath today  Ao = 107/60 (81) LV = 112/21 RA = 12 RV = 64/20 PA = 68/25 (44) PCW = 25  Fick cardiac output/index = 4.6/2.2 PVR = 4.1 WU Ao sat = 94% PA sat =68%, 67% PAPi = 3.6  Assessment:  1. 3v CAD 2. Severe NICM EF < 20% 3. Elevated filling pressures with markedly reduced CO with severe AI  Plan/Discussion:   He will need a VAD. Does have some RV dysfunction but not prohibitive. Would consider grafting RCA at time of VAD to help support RV.   Objective:   Weight Range: 103.7 kg Body mass index is 35.81 kg/m.   Vital Signs:   Temp:  [97.6 F (36.4 C)-98.3 F (36.8 C)] 98.3 F (36.8 C) (02/23 1100) Pulse Rate:  [65-108] 96 (02/23 1530) Resp:  [13-22] 20 (02/23 1530) BP: (98-163)/(57-97) 161/57 (02/23 1530) SpO2:  [89 %-98 %] 90 % (02/23 1530) FiO2 (%):  [40 %] 40 % (02/23 0050) Weight:  [103.7 kg] 103.7 kg (02/23 0630) Last BM Date : 02/13/22  Weight change: Filed Weights   02/12/22 0500 02/13/22 0400 01/29/2022 0630  Weight: 103.1 kg 102.4 kg 103.7 kg    Intake/Output:   Intake/Output Summary (Last 24 hours) at 01/26/2022 1613 Last data filed at 01/30/2022 1200 Gross per 24 hour  Intake 1851.23 ml  Output 1400 ml  Net 451.23 ml       Physical Exam    General:  Sitting up in chair No resp difficulty HEENT: normal Neck: supple. nJVP 10 Carotids 2+ bilat; no bruits. No lymphadenopathy or thryomegaly appreciated. Cor: PMI nondisplaced. Regular tachy 2/6 AI/AS Lungs: clear Abdomen: soft, nontender, nondistended. No hepatosplenomegaly. No bruits or masses. Good bowel sounds. Extremities: no cyanosis, clubbing, rash, edema Neuro: alert & orientedx3, cranial nerves grossly intact. moves all 4 extremities w/o  difficulty. Affect pleasant   Telemetry   Sinus 90-105 + PVCs  Personally reviewed   Labs    CBC Recent Labs    02/13/22 0358 02/16/2022 0450 02/01/2022 0754 02/09/2022 0758  WBC 9.5 8.2  --   --   HGB 18.1* 17.4* 18.0* 18.4*   15.0  HCT 56.2* 54.8* 53.0* 54.0*   44.0  MCV 92.7 94.3  --   --   PLT 239 216  --   --     Basic Metabolic Panel Recent Labs    02/13/22 0358 02/19/2022 0450 01/24/2022 0754 02/07/2022 0758  NA 133* 132* 139 138   145  K 4.4 4.9 4.0 4.2   2.9*  CL 100 101  --   --   CO2 23 22  --   --   GLUCOSE 102* 97  --   --   BUN 26* 27*  --   --   CREATININE 1.32* 1.14  --   --   CALCIUM 9.0 8.5*  --   --   MG 2.4 2.4  --   --     Liver Function Tests No results for input(s): AST, ALT, ALKPHOS, BILITOT, PROT, ALBUMIN in the last 72 hours.  No results for input(s): LIPASE, AMYLASE in the last 72 hours. Cardiac Enzymes No results for input(s): CKTOTAL, CKMB, CKMBINDEX, TROPONINI in the  last 72 hours.  BNP: BNP (last 3 results) Recent Labs    03/22/21 1106 02/19/2022 1649 02/11/22 0345  BNP 995.8* 1,303.2* 166.8*     ProBNP (last 3 results) No results for input(s): PROBNP in the last 8760 hours.   D-Dimer No results for input(s): DDIMER in the last 72 hours. Hemoglobin A1C Recent Labs    02/13/22 0358  HGBA1C 6.2*   Fasting Lipid Panel Recent Labs    02/13/22 0358  CHOL 172  HDL 56  LDLCALC 96  TRIG 100  CHOLHDL 3.1    Thyroid Function Tests No results for input(s): TSH, T4TOTAL, T3FREE, THYROIDAB in the last 72 hours.  Invalid input(s): FREET3   Other results:   Imaging    CARDIAC CATHETERIZATION  Result Date: 02/17/2022   Prox RCA to Mid RCA lesion is 80% stenosed.   1st Mrg lesion is 90% stenosed.   Prox LAD to Mid LAD lesion is 40% stenosed.   1st Diag lesion is 90% stenosed.   The left ventricular ejection fraction is less than 25% by visual estimate. Findings: On milrinone 0.125 + NE 2 Ao = 107/60 (81) LV = 112/21 RA =  12 RV = 64/20 PA = 68/25 (44) PCW = 25 Fick cardiac output/index = 4.6/2.2 PVR = 4.1 WU Ao sat = 94% PA sat =68%, 67% PAPi = 3.6 Assessment: 1. 2v CAD 2. Severe NICM EF < 20% 3. Elevated filling pressures with markedly reduced CO with severe AI Plan/Discussion: He will need a VAD. Does have some RV dysfunction but not prohibitive. Would consider grafting RCA at time of VAD to help support RV. [name], MD 4:12 PM  VAS US DOPPLER PRE VAD  Result Date: 01/30/2022 PERIOPERATIVE VASCULAR EVALUATION Patient Name:  Colin Walton  Date of Exam:   01/26/2022 Medical Rec #: [id number]        Accession #:    [id number] Date of Birth: Sep 11, 1971        Patient Gender: M Patient Age:   16 years Exam Location:  [facility] Procedure:      VAS US DOPPLER PRE VAD Referring Phys: Quillian Quince Emre Stock --------------------------------------------------------------------------------  Indications:      Pre-VAD. Comparison Study: No prior studies. Performing Technologist: Carlos Levering RVT  Examination Guidelines: A complete evaluation includes B-mode imaging, spectral Doppler, color Doppler, and power Doppler as needed of all accessible portions of each vessel. Bilateral testing is considered an integral part of a complete examination. Limited examinations for reoccurring indications may be performed as noted.  Right Carotid Findings: +----------+--------+--------+--------+-----------------------+--------+             PSV cm/s EDV cm/s Stenosis Describe                Comments  +----------+--------+--------+--------+-----------------------+--------+  CCA Prox   135      8                 smooth and heterogenous           +----------+--------+--------+--------+-----------------------+--------+  CCA Distal 84       9                 smooth and heterogenous           +----------+--------+--------+--------+-----------------------+--------+  ICA Prox   48       15                smooth and heterogenous            +----------+--------+--------+--------+-----------------------+--------+  ICA Distal 81       26                                                  +----------+--------+--------+--------+-----------------------+--------+  ECA        112      8                                                   +----------+--------+--------+--------+-----------------------+--------+ +----------+--------+-------+--------+------------+             PSV cm/s EDV cms Describe Arm Pressure  +----------+--------+-------+--------+------------+  Subclavian 175                                     +----------+--------+-------+--------+------------+ +---------+--------+--+--------+--+---------+  Vertebral PSV cm/s 75 EDV cm/s 16 Antegrade  +---------+--------+--+--------+--+---------+ Left Carotid Findings: +----------+--------+--------+--------+-----------------------+--------+             PSV cm/s EDV cm/s Stenosis Describe                Comments  +----------+--------+--------+--------+-----------------------+--------+  CCA Prox   117                        smooth and heterogenous           +----------+--------+--------+--------+-----------------------+--------+  CCA Distal 83       10                smooth and heterogenous           +----------+--------+--------+--------+-----------------------+--------+  ICA Prox   60       14                smooth and heterogenous           +----------+--------+--------+--------+-----------------------+--------+  ICA Distal 70       21                                                  +----------+--------+--------+--------+-----------------------+--------+  ECA        133      9                                                   +----------+--------+--------+--------+-----------------------+--------+ +----------+--------+--------+--------+------------+  Subclavian PSV cm/s EDV cm/s Describe Arm Pressure  +----------+--------+--------+--------+------------+             188                        135            +----------+--------+--------+--------+------------+ +---------+--------+--+--------+--+---------+  Vertebral PSV cm/s 43 EDV cm/s 14 Antegrade  +---------+--------+--+--------+--+---------+  ABI Findings: +--------+------------------+-----+---------+--------------+  Right    Rt Pressure (mmHg) Index Waveform  Comment         +--------+------------------+-----+---------+--------------+  Brachial  Restricted arm  +--------+------------------+-----+---------+--------------+  PTA      120                0.89  triphasic                 +--------+------------------+-----+---------+--------------+  DP       117                0.87  biphasic                  +--------+------------------+-----+---------+--------------+ +--------+------------------+-----+---------+-------+  Left     Lt Pressure (mmHg) Index Waveform  Comment  +--------+------------------+-----+---------+-------+  Brachial 135                      triphasic          +--------+------------------+-----+---------+-------+  PTA      113                0.84  triphasic          +--------+------------------+-----+---------+-------+  DP       119                0.88  triphasic          +--------+------------------+-----+---------+-------+ +-------+---------------+----------------+  ABI/TBI Today's ABI/TBI Previous ABI/TBI  +-------+---------------+----------------+  Right   0.89                              +-------+---------------+----------------+  Left    0.88                              +-------+---------------+----------------+  Summary: Right Carotid: Velocities in the right ICA are consistent with a 1-39% stenosis. Left Carotid: Velocities in the left ICA are consistent with a 1-39% stenosis. Vertebrals: Bilateral vertebral arteries demonstrate antegrade flow.  *See table(s) above for measurements and observations. Right ABI: Resting right ankle-brachial index indicates mild right lower extremity arterial disease. Left ABI:  Resting left ankle-brachial index indicates mild left lower extremity arterial disease.     Preliminary    VAS [place] LOWER EXTREMITY VENOUS (DVT)  Result Date: 02/17/2022  Lower Venous DVT Study Patient Name:  Colin Walton  Date of Exam:   02/01/2022 Medical Rec #: [id number]        Accession #:    [id number] Date of Birth: [birth year]        Patient Gender: M Patient Age:   69 years Exam Location:  [facility] Procedure:      VAS [place] LOWER EXTREMITY VENOUS (DVT) Referring Phys: Quillian Quince Timarion Agcaoili --------------------------------------------------------------------------------  Indications: Pre-VAD.  Risk Factors: None identified. Comparison Study: No prior studies. Performing Technologist: Oliver Hum RVT  Examination Guidelines: A complete evaluation includes B-mode imaging, spectral Doppler, color Doppler, and power Doppler as needed of all accessible portions of each vessel. Bilateral testing is considered an integral part of a complete examination. Limited examinations for reoccurring indications may be performed as noted. The reflux portion of the exam is performed with the patient in reverse Trendelenburg.  +---------+---------------+---------+-----------+----------+--------------+  RIGHT     Compressibility Phasicity Spontaneity Properties Thrombus Aging  +---------+---------------+---------+-----------+----------+--------------+  CFV       Full            Yes       Yes                                    +---------+---------------+---------+-----------+----------+--------------+  SFJ       Full                                                             +---------+---------------+---------+-----------+----------+--------------+  FV Prox   Full                                                             +---------+---------------+---------+-----------+----------+--------------+  FV Mid    Full                                                              +---------+---------------+---------+-----------+----------+--------------+  FV Distal Full                                                             +---------+---------------+---------+-----------+----------+--------------+  PFV       Full                                                             +---------+---------------+---------+-----------+----------+--------------+  POP       Full            Yes       Yes                                    +---------+---------------+---------+-----------+----------+--------------+  PTV       Full                                                             +---------+---------------+---------+-----------+----------+--------------+  PERO      Full                                                             +---------+---------------+---------+-----------+----------+--------------+   +---------+---------------+---------+-----------+----------+--------------+  LEFT      Compressibility Phasicity Spontaneity Properties Thrombus Aging  +---------+---------------+---------+-----------+----------+--------------+  CFV       Full            Yes       Yes                                    +---------+---------------+---------+-----------+----------+--------------+  SFJ       Full                                                             +---------+---------------+---------+-----------+----------+--------------+  FV Prox   Full                                                             +---------+---------------+---------+-----------+----------+--------------+  FV Mid    Full                                                             +---------+---------------+---------+-----------+----------+--------------+  FV Distal Full                                                             +---------+---------------+---------+-----------+----------+--------------+  PFV       Full                                                              +---------+---------------+---------+-----------+----------+--------------+  POP       Full            Yes       Yes                                    +---------+---------------+---------+-----------+----------+--------------+  PTV       Full                                                             +---------+---------------+---------+-----------+----------+--------------+  PERO      Full                                                             +---------+---------------+---------+-----------+----------+--------------+     Summary: RIGHT: - There is no evidence of deep vein thrombosis in the lower extremity.  - No cystic structure found in the popliteal fossa.  LEFT: - There is no evidence of deep vein thrombosis in the lower extremity.  - No cystic structure found in the popliteal fossa.  *See table(s)  above for measurements and observations.    Preliminary    CT CHEST ABDOMEN PELVIS WO CONTRAST  Result Date: 02/01/2022 CLINICAL DATA:  Vats evaluation EXAM: CT CHEST, ABDOMEN AND PELVIS WITHOUT CONTRAST TECHNIQUE: Multidetector CT imaging of the chest, abdomen and pelvis was performed following the standard protocol without IV contrast. RADIATION DOSE REDUCTION: This exam was performed according to the departmental dose-optimization program which includes automated exposure control, adjustment of the mA and/or kV according to patient size and/or use of iterative reconstruction technique. COMPARISON:  Chest CT December 07, 2015 FINDINGS: CT CHEST FINDINGS Cardiovascular: Right upper extremity PICC with tip near the superior cavoatrial junction. Aortic atherosclerosis without aneurysmal dilation. Four-chamber cardiac enlargement. No significant pericardial effusion/thickening. Mediastinum/Nodes: No supraclavicular adenopathy. No discrete thyroid nodularity. Prominent mediastinal lymph nodes which is decreased in size since December 07, 2015 for instance a precarinal lymph node on image 26/3 now measures 5  mm previously 11 mm. No pathologically enlarged mediastinal, hilar or axillary lymph nodes, noting limited sensitivity for the detection of hilar adenopathy on this noncontrast study. Esophagus is grossly unremarkable. Lungs/Pleura: Biapical pleuroparenchymal scarring with upper lobe predominant emphysematous change. Solid subpleural 6 mm right upper lobe pulmonary nodule on image 76/3, previously measuring 3 mm on CT December 07, 2015. No pleural effusion or pneumothorax. Musculoskeletal: No chest wall mass or suspicious bone lesions identified. CT ABDOMEN PELVIS FINDINGS Hepatobiliary: Nodular hepatic contour with enlargement of the caudate and lateral left lobe of the liver suggestive of cirrhosis. Gallbladder is nondistended. No biliary ductal dilation. Pancreas: No pancreatic ductal dilation or evidence of acute inflammation. Spleen: Normal in size without focal abnormality. Adrenals/Urinary Tract: Bilateral adrenal glands appear normal. No hydronephrosis. No renal, ureteral or bladder calculi identified. No contour deforming renal mass. Urinary bladder is unremarkable for degree of distension. Stomach/Bowel: Radiopaque enteric contrast material traverses the terminal ileum. Stomach is distended with ingested material without wall thickening. No pathologic dilation of small or large bowel. The appendix and terminal ileum appear normal. Pancolonic diverticulosis without findings of acute diverticulitis. No evidence of acute bowel inflammation. Vascular/Lymphatic: No pathologically enlarged abdominal or pelvic lymph nodes. Aortic atherosclerosis without aneurysmal dilation. Reproductive: Prostate is unremarkable. Other: No significant abdominopelvic free fluid. Musculoskeletal: No acute or significant osseous findings. IMPRESSION: 1. No acute findings within the chest, abdomen, or pelvis. 2. Pancolonic diverticulosis without findings of acute diverticulitis. 3. Nodular hepatic contour with enlargement of the  caudate and lateral left lobe of the liver suggestive of cirrhosis. 4. Solid 6 mm subpleural right upper lobe pulmonary nodule, previously measuring 3 mm on CT December 07, 2015. Given the minimal enlargement over proximally 6 years is almost certainly reflects a benign/indolent process. Consider follow-up noncontrast chest CT in 12 months to assess stability. 5. Four-chamber cardiac enlargement. 6. Aortic Atherosclerosis (ICD10-I70.0) and Emphysema (ICD10-J43.9). Electronically Signed   By: Dahlia Bailiff M.D.   On: 01/28/2022 07:59     Medications:     Scheduled Medications:  (feeding supplement) PROSource Plus  30 mL Oral BID BM   allopurinol  300 mg Oral Daily   atorvastatin  10 mg Oral Daily   chlorhexidine  15 mL Mouth Rinse BID   Chlorhexidine Gluconate Cloth  6 each Topical Daily   dapagliflozin propanediol  10 mg Oral Daily   digoxin  0.125 mg Oral Daily   enoxaparin (LOVENOX) injection  50 mg Subcutaneous Q24H   melatonin  3 mg Oral QHS   midodrine  2.5 mg Oral TID WC   multivitamin with  minerals  1 tablet Oral Daily   sodium chloride flush  10-40 mL Intracatheter Q12H   sodium chloride flush  3 mL Intravenous Q12H    Infusions:  sodium chloride     milrinone 0.125 mcg/kg/min (02/13/2022 0700)   norepinephrine (LEVOPHED) Adult infusion 2 mcg/min (02/12/2022 0700)    PRN Medications: sodium chloride, acetaminophen, guaiFENesin-dextromethorphan, ondansetron (ZOFRAN) IV, sodium chloride flush, sodium chloride flush   Assessment/Plan   1. Acute on Chronic Biventricular Heart Failure  - due to NICM - suspect due to HTN and OSA +/- ETOH - cath 03/2013 no CAD - Echo 11/20 EF 30-35% - Echo 4/21 EF 20-25% moderate RV dysfunction (worse since previous) - Personally reviewed - TEE on 04/18/20. Procedure c/b by severe respiratory distress and laryngospasm. LVEF 25% RV moderately HK. Aortic valve not interrogated completely due to respiratory distress. AI appeared to be moderate.  -  cMRI 8/21 EF 24% Moderate AI. No LGE or infiltrative process. - Echo 03/22/21 EF 25-30% mod AI Moderate RV dysfunction.  - Echo 01/26/2022 EF < 20% with mild LV dilation, RV severely HK with moderate dilation and D-shaped septum, AI moderate to severe.  - Suspect CM related to HTN +/- ETOH. AI may be contributing but severity of biventricular dysfunction out of proportion to AI and LV not overly dilated. - NYHA IV with marked volume overload.  - [facility] 02/09/2022 3v CAD/ RHC as above - Co-ox 67%  on milrinone 0.125 + NE 2 - Continue digoxin 0.125 mg daily - Continue Farxiga 10 mg dailiy - RV moderate to severe HK on echo but PAPI ok  - Ideally will need transplant but is still smoking and using THC. Will plan VAD this admit + AVR +/- graft to RCA - Continue IV diuresis. Increase milrinone back to 0.25 with elevated PVR and low CI   2. Moderate-severe aortic regurgitation - Visually mild to moderate on TEE 4/21 but P1/2 time 215 msec suggesting severe AI.  - TEE on 04/18/20. Procedure c/b by severe respiratory distress and laryngospasm. LVEF 25% RV moderately HK. Aortic valve not interrogated completely due to respiratory distress. AI appeared to be moderate - TEE reviewed with echo team felt to be moderate at worst. - Mild AI on echo 3/22. - Moderate to severe AI on echo 02/04/2022.  On Dr. Clayborne Dana review, LV is mildly dilated. Suspect AI is not cause of cardiomyopathy but is likely making symptoms worse.  - Diurese, afterload reduce.  - Plan AVR at time ov VAD  3. CAD - cath 3v CAD LAD 40% D1 90%, OM 90% mRCA 80% - plan graft to RCA at time of VAD for RV support - ASA statin   4. OSA on CPAP - Fully compliant w/ CPAP.  - Follows with Dr. Radford Pax.   5. Obesity. -Body mass index is 35.81 kg/m.    6. Tobacco use/ETOH - Says he stopped drinking - Continues to smoke, encouraged to quit.    7. ? Fatty Liver - Reports recent diagnosis at [facility], records not available. - CMET 10/22  ok.  8. AKI  - Scr bump from 1.3>>1.9. improved to 1.32 today - Suspect due to hypotensive episode yesterday + diarrhea +cardiorenal  - improved with BP support  - CVP 10. IV lasix today as above   9. Polyarticular Gout - On 3 day prednisone burst - Uric acid only 6 but notes improvement in pain with steroids  10. Nonsustained VT - Multiple runs up to 13 beats on  tele - Keep K> 4.0 Mg > 2.0 - May need amio  11. PSVT - follow  12. Malnutrition - Prealbumin 9.5 - Consult RD  CRITICAL CARE Performed by: [name]  Total critical care time: 45 minutes  Critical care time was exclusive of separately billable procedures and treating other patients.  Critical care was necessary to treat or prevent imminent or life-threatening deterioration.  Critical care was time spent personally by me (independent of midlevel providers or residents) on the following activities: development of treatment plan with patient and/or surrogate as well as nursing, discussions with consultants, evaluation of patient's response to treatment, examination of patient, obtaining history from patient or surrogate, ordering and performing treatments and interventions, ordering and review of laboratory studies, ordering and review of radiographic studies, pulse oximetry and re-evaluation of patient's condition.    Length of Stay: [place], MD  02/06/2022, 4:13 PM  Advanced Heart Failure Team Pager [phone] (M-F; 7a - 5p)  Please contact [facility] for night-coverage after hours (5p -7a ) and weekends on [email or web address]

## 2022-02-14 NOTE — Progress Notes (Signed)
Pt arrived to cath lab holding, Bay 6, connected to monitor, levophed gtt in place at 7.2ml/hour = 60mcg/min, Milrinone infusing at 4cc/hour = 0.12mcg/kg/min, both infusing into RUE PICC, denies pain, safety maintained, questions answered

## 2022-02-14 NOTE — Interval H&P Note (Signed)
History and Physical Interval Note:  02/12/2022 7:43 AM  Colin Walton  has presented today for surgery, with the diagnosis of HF and pre-LVAD transplant.  The various methods of treatment have been discussed with the patient and family. After consideration of risks, benefits and other options for treatment, the patient has consented to  Procedure(s): RIGHT/LEFT HEART CATH AND CORONARY ANGIOGRAPHY (N/A) and possible coronary angioplasty as a surgical intervention.  The patient's history has been reviewed, patient examined, no change in status, stable for surgery.  I have reviewed the patient's chart and labs.  Questions were answered to the patient's satisfaction.     Latysha Thackston

## 2022-02-14 NOTE — Progress Notes (Signed)
Pre-VAD testing has been completed. Preliminary results can be found in CV Proc through chart review.   02/13/2022 1:19 PM Colin Walton RVT

## 2022-02-14 NOTE — Consult Note (Signed)
Consultation Note Date: 01/28/2022   Patient Name: Colin Walton  DOB: [birth year]  MRN: [id number]  Age / Sex: 51 y.o., male  PCP: Colin Sheer, NP Referring Physician: Jolaine Artist, MD  Reason for Consultation: LVAD evaluation   HPI/Patient Profile: 51 y.o. 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  Moderate-severe aortic regurgitation, hypertension, OSA on CPAP obesity, h/o tobacco/EtOH use, gout.  Admitted for treatment and stabilization.   Currently in work-up for LVAD implantation.   Patient and his family face treatment option decisions, advanced directive decisions and anticipatory care needs    Clinical Assessment and Goals of Care:   This NP Colin Walton reviewed medical records, received report from team, assessed the patient and then meet at the bedside with his daughter/Colin Walton, son/ Colin Walton to discuss advanced directives and a preparedness plan in light of anticipated  LVAD implantation next week.    A detailed discussion was had today regarding the concept of a preparedness plan as it relates to LVAD therpay with intention of bridge to transplantation.   Patient and family were comfortable talking about the "what ifs"  and the importance of today's conversation so everyone can have all the information to be full participants and to understand the patient's basic beliefs and wishes as it relates  to healthcare.  Education offered on concepts specific to future possibilities of -long term ventilation -artificial feeding and hydration -long term antibiotic use          -introduced MOST form  -dialysis -psychological adjustments - complications from  chronic or terminal disease unrelated to cardiac LVAD  Patient was able to verbalize to his family the importance of quality of life.     Untimately the goal of   LVAD procedure will increase quality and quantity of life.  He is open to transplant in the future if eligible    Mr "Colin Dash" Walton understands the risks and benefits of he procedure as it relates to his future.  At this time patient is open to all available medical interventions to prolong life and the success of the LVAD therapy. He shared and verbalized an understanding that his family would know when the burdens of treatment would outweigh the benefits and trust in his family's, specifically his mother, he trust in her/mother's judgement at that time.  Patient is hopeful to met with other LVAD patient's in hopes of sharing their experiences and support.   Advanced care planning booklet/Blue book was briefly reviewed with patient and his 2 children.    Patient verbalizes that he would want his mother to be his H POA and main decision maker in the event that he could not speak for himself.  Spiritual care will assist patient and his family and completing documents, likely tomorrow.  Patient tells me his faith base is [employer] .    Patient and family were encouraged to continue conversation into the future as it is vital for the patient centered  care.      SUMMARY OF RECOMMENDATIONS    Patient is open to all offered and available medical interventions to enhance success of current medical interventions and hopefully LVAD therapy in the near future.     Primary Diagnoses: Present on Admission:  Acute on chronic systolic (congestive) heart failure (HCC)   I have reviewed the medical record, interviewed the patient and family, and examined the patient. The following aspects are pertinent.  Past Medical History:  Diagnosis Date   Chronic systolic CHF (congestive heart failure) (HCC)    Hyperlipidemia    Hypertension    Mild mitral regurgitation 2016   NICM (nonischemic cardiomyopathy) ([address])    Obesity    Sleep apnea 12/24/12   Sleep study February 2014   Social History    Socioeconomic History   Marital status: Single    Spouse name: Not on file   Number of children: 3   Years of education: Not on file   Highest education level: Not on file  Occupational History   Occupation: Works with [employer]  Tobacco Use   Smoking status: Light Smoker    Packs/day: 0.05    Years: 22.00    Pack years: 1.10    Types: Cigarettes   Smokeless tobacco: Never  Vaping Use   Vaping Use: Never used  Substance and Sexual Activity   Alcohol use: No    Alcohol/week: 30.0 standard drinks    Types: 30 Standard drinks or equivalent per week    Comment: x 1 week   Drug use: No    Types: Marijuana   Sexual activity: Not on file  Other Topics Concern   Not on file  Social History Narrative   Not on file   Social Determinants of Health   Financial Resource Strain: Low Risk    Difficulty of Paying Living Expenses: Not very hard  Food Insecurity: No Food Insecurity   Worried About Charity fundraiser in the Last Year: Never true   Ran Out of Food in the Last Year: Never true  Transportation Needs: No Transportation Needs   Lack of Transportation (Medical): No   Lack of Transportation (Non-Medical): No  Physical Activity: Not on file  Stress: Not on file  Social Connections: Not on file   Family History  Problem Relation Age of Onset   Arrhythmia Maternal Grandfather        Atrial fibrillation   CAD Neg Hx    Scheduled Meds:  (feeding supplement) PROSource Plus  30 mL Oral BID BM   allopurinol  300 mg Oral Daily   atorvastatin  10 mg Oral Daily   chlorhexidine  15 mL Mouth Rinse BID   Chlorhexidine Gluconate Cloth  6 each Topical Daily   dapagliflozin propanediol  10 mg Oral Daily   digoxin  0.125 mg Oral Daily   enoxaparin (LOVENOX) injection  50 mg Subcutaneous Q24H   melatonin  3 mg Oral QHS   midodrine  2.5 mg Oral TID WC   sodium chloride flush  10-40 mL Intracatheter Q12H   sodium chloride flush  3 mL Intravenous Q12H    Continuous Infusions:  sodium chloride     milrinone 0.125 mcg/kg/min (02/12/2022 0700)   norepinephrine (LEVOPHED) Adult infusion 2 mcg/min (02/11/2022 0700)   PRN Meds:.sodium chloride, acetaminophen, guaiFENesin-dextromethorphan, ondansetron (ZOFRAN) IV, sodium chloride flush, sodium chloride flush Medications Prior to Admission:  Prior to Admission medications   Medication Sig Start Date End Date  Taking? Authorizing Provider  albuterol (VENTOLIN HFA) 108 (90 Base) MCG/ACT inhaler Inhale 1 puff into the lungs every 6 (six) hours as needed. 09/13/21  Yes [provider]  allopurinol (ZYLOPRIM) 300 MG tablet Take 300 mg by mouth daily. 12/18/21  Yes [provider]  amLODipine (NORVASC) 10 MG tablet Take 1 tablet (10 mg total) by mouth daily. 11/08/19  Yes Dunn, Dayna N, PA-C  atorvastatin (LIPITOR) 10 MG tablet Take 1 tablet (10 mg total) by mouth daily. 11/08/19  Yes Dunn, Dayna N, PA-C  carvedilol (COREG) 25 MG tablet Take 2 tablets (50 mg total) by mouth 2 (two) times daily. 01/26/21  Yes Bensimhon, Shaune Pascal, MD  cetirizine (ZYRTEC) 10 MG tablet Take 10 mg by mouth daily.   Yes [provider]  colchicine 0.6 MG tablet Take 2 tablets at first sign of gout flare and then 1 tablet an hour later.  Repeat every 3 days until gout flare subsides. 11/20/20  Yes Bensimhon, Shaune Pascal, MD  dapagliflozin propanediol (FARXIGA) 10 MG TABS tablet Take 1 tablet (10 mg total) by mouth daily before breakfast. 06/28/20  Yes Bensimhon, Shaune Pascal, MD  Dextromethorphan-guaiFENesin (ROBITUSSIN DM PO) Take 30 mLs by mouth in the morning and at bedtime.   Yes [provider]  digoxin (LANOXIN) 0.125 MG tablet Take 1 tablet (0.125 mg total) by mouth daily. 11/20/20  Yes Bensimhon, Shaune Pascal, MD  hydrALAZINE (APRESOLINE) 100 MG tablet Take 1 tablet (100 mg total) by mouth 3 (three) times daily. 06/28/20  Yes Bensimhon, Shaune Pascal, MD  isosorbide mononitrate (IMDUR) 60 MG 24 hr tablet Take 1  tablet (60 mg total) by mouth daily. 03/22/20  Yes Bensimhon, Shaune Pascal, MD  JARDIANCE 10 MG TABS tablet Take 10 mg by mouth daily. 12/18/21  Yes [provider]  metFORMIN (GLUCOPHAGE) 500 MG tablet Take 500 mg by mouth daily. 09/13/21  Yes [provider]  naproxen sodium (ALEVE) 220 MG tablet Take 220 mg by mouth as needed (pain, headache).   Yes [provider]  potassium chloride SA (KLOR-CON) 20 MEQ tablet Take 2 tablets (40 mEq total) by mouth 2 (two) times daily. 10/08/21  Yes Milford, Maricela Bo, FNP  spironolactone (ALDACTONE) 25 MG tablet Take 1 tablet (25 mg total) by mouth daily. 10/08/21  Yes Milford, Maricela Bo, FNP  torsemide (DEMADEX) 20 MG tablet Take 4 tablets (80 mg total) by mouth 2 (two) times daily. 10/04/21  Yes Bensimhon, Shaune Pascal, MD  Vitamin D, Ergocalciferol, (DRISDOL) 1.25 MG (50000 UNIT) CAPS capsule Take 50,000 Units by mouth every Monday. 08/14/20  Yes [provider]  metolazone (ZAROXOLYN) 2.5 MG tablet Take 1 tablet (2.5 mg total) by mouth as directed. By heart failure clinic Patient not taking: Reported on 02/02/2022 10/08/21   Rafael Bihari, FNP  sacubitril-valsartan (ENTRESTO) 97-103 MG Take 1 tablet by mouth 2 (two) times daily. Patient not taking: Reported on 02/17/2022 02/17/20   Bensimhon, Shaune Pascal, MD   No Known Allergies Review of Systems  Constitutional:  Positive for fatigue.   Physical Exam Constitutional:      Appearance: He is obese.  Cardiovascular:     Rate and Rhythm: Normal rate.  Pulmonary:     Effort: Pulmonary effort is normal.  Skin:    General: Skin is warm and dry.  Neurological:     Mental Status: He is alert and oriented to person, place, and time.    Vital Signs: BP 114/77    Pulse 88  Temp 98.3 F (36.8 C) (Oral)    Resp 16    Wt 103.7 kg    SpO2 94%    BMI 35.81 kg/m  Pain Scale: 0-10 POSS *See Group Information*: 1-Acceptable,Awake and alert Pain Score: 0-No pain   SpO2: SpO2: 94  % O2 Device:SpO2: 94 % O2 Flow Rate: .O2 Flow Rate (L/min): 2 L/min  IO: Intake/output summary:  Intake/Output Summary (Last 24 hours) at 01/24/2022 1309 Last data filed at 01/25/2022 1200 Gross per 24 hour  Intake 2354.21 ml  Output 2300 ml  Net 54.21 ml    LBM: Last BM Date : 02/13/22 Baseline Weight: Weight: 106 kg Most recent weight: Weight: 103.7 kg     Palliative Assessment/Data:   Discussed with bedside RN   Time In: 1400 Time Out: 1530 Time Total:  90 minutes Greater than 50%  of this time was spent counseling and coordinating care related to the above assessment and plan.  Signed by: Colin Lessen, NP   Please contact Palliative Medicine Team phone at [phone] for questions and concerns.  For individual provider: See Shea Evans

## 2022-02-14 NOTE — Progress Notes (Signed)
Initial Nutrition Assessment  DOCUMENTATION CODES:   Not applicable  INTERVENTION:   30 ml ProSource Plus BID, each supplement provides 100 kcals and 15 grams protein.   MVI with Minerals daily  NUTRITION DIAGNOSIS:   Increased nutrient needs related to chronic illness as evidenced by estimated needs.  GOAL:   Patient will meet greater than or equal to 90% of their needs  MONITOR:   PO intake, Supplement acceptance, Labs, Weight trends  REASON FOR ASSESSMENT:   Consult LVAD Eval  ASSESSMENT:   51 yo male admitted with acute on chronic biventricular heart failure with work-up for LVAD. PMH includes HTN, OSA on CPAP, tobacco use/EtOH  On visit today, mom at bedside. Pt on the phone talking to employer about benefits, FMLA, etc. Lots of disciplines in and out of patient's room. Plan to follow-up and complete further assessment  LVAD work-up ongoing. Noted pt ideally needs transplant but still smoking tobacco, THC.   Plan for R/L cath tomorrow  Pt with good appetite; recorded po intake mostly 90-100% of meals  Current wt 103.7 kg Non-pitting generalized edema on exam  Labs: reviewed Meds: reviewed   NUTRITION - FOCUSED PHYSICAL EXAM:  Unable to assess  Diet Order:   Diet Order             Diet 2 gram sodium Room service appropriate? Yes; Fluid consistency: Thin  Diet effective now                   EDUCATION NEEDS:   Education needs have been addressed  Skin:  Skin Assessment: Reviewed RN Assessment  Last BM:  2/22  Height:   Ht Readings from Last 1 Encounters:  February 13, 2022 5\' 7"  (1.702 m)    Weight:   Wt Readings from Last 1 Encounters:  02/07/2022 103.7 kg     BMI:  Body mass index is 35.81 kg/m.  Estimated Nutritional Needs:   Kcal:  2100-2300 kcals  Protein:  110-130 g  Fluid:  >/= 2 L   02/16/22 MS, RDN, LDN, CNSC Registered Dietitian III Clinical Nutrition RD Pager and On-Call Pager Number Located in [place]

## 2022-02-15 ENCOUNTER — Encounter (HOSPITAL_COMMUNITY): Payer: Self-pay | Admitting: Internal Medicine

## 2022-02-15 DIAGNOSIS — Z01818 Encounter for other preprocedural examination: Secondary | ICD-10-CM

## 2022-02-15 DIAGNOSIS — I5023 Acute on chronic systolic (congestive) heart failure: Secondary | ICD-10-CM | POA: Diagnosis not present

## 2022-02-15 DIAGNOSIS — I509 Heart failure, unspecified: Secondary | ICD-10-CM | POA: Diagnosis not present

## 2022-02-15 DIAGNOSIS — K045 Chronic apical periodontitis: Secondary | ICD-10-CM | POA: Diagnosis not present

## 2022-02-15 DIAGNOSIS — K029 Dental caries, unspecified: Secondary | ICD-10-CM | POA: Diagnosis not present

## 2022-02-15 DIAGNOSIS — K083 Retained dental root: Secondary | ICD-10-CM

## 2022-02-15 DIAGNOSIS — K0850 Unsatisfactory restoration of tooth, unspecified: Secondary | ICD-10-CM

## 2022-02-15 DIAGNOSIS — R570 Cardiogenic shock: Secondary | ICD-10-CM | POA: Diagnosis not present

## 2022-02-15 DIAGNOSIS — F40232 Fear of other medical care: Secondary | ICD-10-CM

## 2022-02-15 DIAGNOSIS — I2510 Atherosclerotic heart disease of native coronary artery without angina pectoris: Secondary | ICD-10-CM | POA: Diagnosis not present

## 2022-02-15 DIAGNOSIS — I351 Nonrheumatic aortic (valve) insufficiency: Secondary | ICD-10-CM | POA: Diagnosis not present

## 2022-02-15 LAB — COOXEMETRY PANEL
Carboxyhemoglobin: 2.2 % — ABNORMAL HIGH (ref 0.5–1.5)
Carboxyhemoglobin: 2.3 % — ABNORMAL HIGH (ref 0.5–1.5)
Methemoglobin: 0.7 % (ref 0.0–1.5)
Methemoglobin: 0.8 % (ref 0.0–1.5)
O2 Saturation: 80.3 %
O2 Saturation: 75.7 %
Total hemoglobin: 17.4 g/dL — ABNORMAL HIGH (ref 12.0–16.0)
Total hemoglobin: 17.8 g/dL — ABNORMAL HIGH (ref 12.0–16.0)

## 2022-02-15 LAB — CBC
HCT: 53.4 % — ABNORMAL HIGH (ref 39.0–52.0)
Hemoglobin: 16.9 g/dL (ref 13.0–17.0)
MCH: 29.5 pg (ref 26.0–34.0)
MCHC: 31.6 g/dL (ref 30.0–36.0)
MCV: 93.2 fL (ref 80.0–100.0)
Platelets: 230 10*3/uL (ref 150–400)
RBC: 5.73 MIL/uL (ref 4.22–5.81)
RDW: 14.4 % (ref 11.5–15.5)
WBC: 5.5 10*3/uL (ref 4.0–10.5)
nRBC: 0 % (ref 0.0–0.2)

## 2022-02-15 LAB — BASIC METABOLIC PANEL
Anion gap: 5 (ref 5–15)
BUN: 22 mg/dL — ABNORMAL HIGH (ref 6–20)
CO2: 27 mmol/L (ref 22–32)
Calcium: 8.3 mg/dL — ABNORMAL LOW (ref 8.9–10.3)
Chloride: 105 mmol/L (ref 98–111)
Creatinine, Ser: 1.1 mg/dL (ref 0.61–1.24)
GFR, Estimated: >60 mL/min (ref >60)
Glucose, Bld: 87 mg/dL (ref 70–99)
Potassium: 4.6 mmol/L (ref 3.5–5.1)
Sodium: 137 mmol/L (ref 135–145)

## 2022-02-15 MED ORDER — FUROSEMIDE 10 MG/ML IJ SOLN
40.0000 mg | Freq: Once | INTRAMUSCULAR | Status: DC
Start: 2022-02-15 — End: 2022-02-15

## 2022-02-15 MED ORDER — ATORVASTATIN CALCIUM 80 MG PO TABS
80.0000 mg | ORAL_TABLET | Freq: Every day | ORAL | Status: DC
  Administered 2022-02-16 – 2022-02-23 (×7): 80 mg via ORAL
  Filled 2022-02-15 (×7): qty 1

## 2022-02-15 MED ORDER — MILRINONE LACTATE IN DEXTROSE 20-5 MG/100ML-% IV SOLN
0.2500 ug/kg/min | INTRAVENOUS | Status: DC
  Administered 2022-02-15 – 2022-02-20 (×9): 0.25 ug/kg/min via INTRAVENOUS
  Filled 2022-02-15 (×11): qty 100

## 2022-02-15 MED ORDER — FUROSEMIDE 10 MG/ML IJ SOLN
40.0000 mg | Freq: Two times a day (BID) | INTRAMUSCULAR | Status: DC
  Administered 2022-02-15 – 2022-02-17 (×5): 40 mg via INTRAVENOUS
  Filled 2022-02-15 (×5): qty 4

## 2022-02-15 MED ORDER — ASPIRIN EC 81 MG PO TBEC
81.0000 mg | DELAYED_RELEASE_TABLET | Freq: Every day | ORAL | Status: DC
  Administered 2022-02-15 – 2022-02-21 (×7): 81 mg via ORAL
  Filled 2022-02-15 (×7): qty 1

## 2022-02-15 MED FILL — Lidocaine HCl Local Preservative Free (PF) Inj 1%: INTRAMUSCULAR | Qty: 30 | Status: AC

## 2022-02-15 NOTE — Progress Notes (Signed)
Initial Nutrition Assessment  DOCUMENTATION CODES:   Not applicable  INTERVENTION:   MVI with Minerals Daily  Allow double portions at meal times  30 ml ProSource Plus BID, each supplement provides 100 kcals and 15 grams protein.   Continue Low Sodium diet for now; consider liberalizing diet to allow more options while inpatient. Pt is aware that he will need to follow low sodium diet at discharge.   NUTRITION DIAGNOSIS:   Increased nutrient needs related to chronic illness as evidenced by estimated needs.  Being addressed via supplements, double portions  GOAL:   Patient will meet greater than or equal to 90% of their needs  Progressing  MONITOR:   PO intake, Supplement acceptance, Labs, Weight trends  REASON FOR ASSESSMENT:   Consult LVAD Eval  ASSESSMENT:   51 yo male admitted with acute on chronic biventricular heart failure with work-up for LVAD. PMH includes HTN, OSA on CPAP, tobacco use/EtOH  Remains on levo. Sitting up in chair, getting ready to eat lunch. Mother at bedside.   Pt reports very good appetite; reports he is still hungry after eating meals here in hospital. Discussed allowing double portions at meal times, pt agreeable and prefers this over oral nutrition supplements. Pt requesting foods like watermelon, salmon, nuts; mother to bring in for pt as not available inpatient.   Pt reports he has good appetite at home and eats well but does not eat a varied diet; MVI with Minerals has been ordered.   Pt is aware that he will need to make dietary changes at discharge; RD to continue education/discussion surround this postop.   Pt denies any changes in weight other than fluid changes prior to admission.   Pt reports he feels physically strong.   Noted dentist to see today; no note written but per pt, dentist has already seen and plan for some extractions next week. Pt agreeable to oral nutrition supplement such as Ensure post extractions if chewing is  difficult and po intake declines.   Labs: reviewed Meds: lasix, MVI with minerals   NUTRITION - FOCUSED PHYSICAL EXAM:  Flowsheet Row Most Recent Value  Orbital Region No depletion  Upper Arm Region No depletion  Thoracic and Lumbar Region No depletion  Buccal Region No depletion  Temple Region No depletion  Clavicle Bone Region No depletion  Clavicle and Acromion Bone Region No depletion  Scapular Bone Region No depletion  Dorsal Hand No depletion  Patellar Region No depletion  Anterior Thigh Region No depletion  Posterior Calf Region No depletion  Edema (RD Assessment) Mild       Diet Order:   Diet Order             Diet 2 gram sodium Room service appropriate? Yes; Fluid consistency: Thin  Diet effective now                   EDUCATION NEEDS:   Education needs have been addressed  Skin:  Skin Assessment: Reviewed RN Assessment  Last BM:  2/22  Height:   Ht Readings from Last 1 Encounters:  02/01/2022 5\' 7"  (1.702 m)    Weight:   Wt Readings from Last 1 Encounters:  02/15/22 104.2 kg    BMI:  Body mass index is 35.98 kg/m.  Estimated Nutritional Needs:   Kcal:  2100-2300 kcals  Protein:  110-130 g  Fluid:  >/= 2 L    02/17/22 MS, RDN, LDN, CNSC Registered Dietitian III Clinical Nutrition RD Pager and  On-Call Pager Number Located in [place]

## 2022-02-15 NOTE — Progress Notes (Signed)
This chaplain is present with the Pt., Pt. family, notary, and two witnesses for the completing and notarizing of the Pt. Advance Directive:HCPOA.   The Pt. named Colin Walton as his HCPOA. If the HCPOA is unwilling or unable to serve the Pt. next choice is [name].  The chaplain gave the Pt. the original AD along with two copies. The chaplain scanned a copy of the Pt. AD into his EMR.  This chaplain is available for F/U spiritual care as needed.  Chaplain Stephanie Acre [phone]

## 2022-02-15 NOTE — Progress Notes (Signed)
Met with pt and his mother to see if they have any questions about LVAD. Patient would like  to speak with Jonnie Finner, Case Manager about FMLA and short term disability. Reached out to [place], she is aware of patient request to see her.   Patient is aware his poor lung function puts him at higher risk to have VAD surgery. He has started working with bedside IS, is pulling 1000 cc; will continue to work on same. Have asked PT to see patient and perform 6 minute walk to establish baseline.   Pt aware that dental is coming today; has not seen anyone yet. He is aware he may need dental extractions.   Both mother and patient agreeable to meet with VAD patient. Will plan on asking someone to meet with him beginning of next week. If possible.   Zada Girt RN, BSN VAD Coordinator 24/7 Pager [phone]

## 2022-02-15 NOTE — Progress Notes (Signed)
RT placed patient on CPAP HS . Patient tolerating well at this time. 

## 2022-02-15 NOTE — Consult Note (Signed)
Department of Dental Medicine   Service Date:   02/15/2022 Admit Date:   01/26/2022  Patient Name:  Colin Walton Date of Birth:   05/08/1971 Medical Record Number: [id number]  Referring Provider:           [name], M.D.   INPATIENT CONSULTATION PLAN/RECOMMENDATIONS   ASSESSMENT There are no current signs of acute odontogenic infection including abscess, edema or erythema, or suspicious lesion requiring biopsy.   Caries, retained root tips, teeth w/ chronic periapical infection  RECOMMENDATIONS Extractions of all indicated teeth to decrease the risk of perioperative and postoperative systemic infection and complications.    PLAN Schedule O.R. for Tuesday (2/28) for extractions under general anesthesia if okay w/ medical team.  Lovenox would need to be held 24 hours prior to surgery and resumed 24 hours after (pending medical team's recommendations). Discuss case with medical team and coordinate treatment as needed.  Discussed in detail all treatment options and recommendations with the patient and they are agreeable to the plan.    Thank you for consulting with Hospital Dentistry and for the opportunity to participate in this patient's treatment.  Should you have any questions or concerns, please contact the [facility] at [phone].       02/15/2022    CONSULT NOTE:  HISTORY OF PRESENT ILLNESS: Colin Walton is a very pleasant 51 y.o. male with h/o congestive heart failure, hypertension, obstructive sleep apnea, COPD, tobacco and alcohol abuse, type 2 diabetes mellitus who is currently admitted for cardiogenic shock/exacerbation of HF and is anticipating potential LVAD implant placement on 3/1.   Hospital dentistry was consulted to complete a medically-necessary dental evaluation as part of the patient's pre-cardiac surgery work-up.   DENTAL HISTORY: The patient reports that he does not have a dentist that he sees regularly.  He currently denies any  dental/orofacial pain or sensitivity. Patient is able to manage oral secretions.  Patient denies dysphagia, odynophagia and dysphonia.   CHIEF COMPLAINT:  Preoperative dental consult.   Patient Active Problem List   Diagnosis Date Noted   Acute on chronic systolic (congestive) heart failure ([place]) 01/28/2022   Alcohol abuse 04/24/2013   Essential hypertension, benign 04/24/2013   CHF (congestive heart failure) ([place]) 01/18/2013   Bleeding nose 01/05/2013   OSA (obstructive sleep apnea) 01/05/2013   Health care maintenance 01/05/2013   Past Medical History:  Diagnosis Date   Chronic systolic CHF (congestive heart failure) ([facility])    Hyperlipidemia    Hypertension    Mild mitral regurgitation 2016   NICM (nonischemic cardiomyopathy) ([place])    Obesity    Sleep apnea 12/24/12   Sleep study February 2014   Past Surgical History:  Procedure Laterality Date   RIGHT/LEFT HEART CATH AND CORONARY ANGIOGRAPHY N/A 01/30/2022   Procedure: RIGHT/LEFT HEART CATH AND CORONARY ANGIOGRAPHY;  Surgeon: Jolaine Artist, MD;  Location: [facility];  Service: Cardiovascular;  Laterality: N/A;   TEE WITHOUT CARDIOVERSION N/A 04/18/2020   Procedure: TRANSESOPHAGEAL ECHOCARDIOGRAM (TEE);  Surgeon: Jolaine Artist, MD;  Location: [facility] ENDOSCOPY;  Service: Cardiovascular;  Laterality: N/A;   VASECTOMY     No Known Allergies Current Facility-Administered Medications  Medication Dose Route Frequency Provider Last Rate Last Admin   (feeding supplement) PROSource Plus liquid 30 mL  30 mL Oral BID BM Bensimhon, Shaune Pascal, MD   30 mL at 02/15/22 1354   0.9 %  sodium chloride infusion  250 mL Intravenous PRN Joette Catching, PA-C  acetaminophen (TYLENOL) tablet 650 mg  650 mg Oral Q4H PRN Joette Catching, PA-C   650 mg at 02/10/22 1549   allopurinol (ZYLOPRIM) tablet 300 mg  300 mg Oral Daily Joette Catching, PA-C   300 mg at 02/15/22 [id number]   aspirin EC tablet  81 mg  81 mg Oral Daily Clegg, Amy D, NP   81 mg at 02/15/22 1047   [START ON 02/16/2022] atorvastatin (LIPITOR) tablet 80 mg  80 mg Oral Daily Clegg, Amy D, NP       chlorhexidine (PERIDEX) 0.12 % solution 15 mL  15 mL Mouth Rinse BID Bensimhon, Shaune Pascal, MD   15 mL at 02/15/22 [id number]   Chlorhexidine Gluconate Cloth 2 % PADS 6 each  6 each Topical Daily Joette Catching, PA-C   6 each at 02/13/22 2030   dapagliflozin propanediol (FARXIGA) tablet 10 mg  10 mg Oral Daily Joette Catching, [place]   10 mg at 02/15/22 [id number]   digoxin (LANOXIN) tablet 0.125 mg  0.125 mg Oral Daily Joette Catching, PA-C   0.125 mg at 02/15/22 [id number]   enoxaparin (LOVENOX) injection 50 mg  50 mg Subcutaneous Q24H Joette Catching, [place]   50 mg at 02/10/2022 2255   furosemide (LASIX) injection 40 mg  40 mg Intravenous BID Clegg, Amy D, NP   40 mg at 02/15/22 1717   guaiFENesin-dextromethorphan (ROBITUSSIN DM) 100-10 MG/5ML syrup 5 mL  5 mL Oral Q4H PRN Joette Catching, PA-C   5 mL at 02/02/2022 2302   melatonin tablet 3 mg  3 mg Oral QHS Joette Catching, PA-C   3 mg at 02/16/2022 2255   milrinone (PRIMACOR) 20 MG/100 ML (0.2 mg/mL) infusion  0.25 mcg/kg/min Intravenous Continuous Clegg, Amy D, NP 7.96 mL/hr at 02/15/22 1800 0.25 mcg/kg/min at 02/15/22 1800   multivitamin with minerals tablet 1 tablet  1 tablet Oral Daily Bensimhon, Shaune Pascal, MD   1 tablet at 02/15/22 [id number]   norepinephrine (LEVOPHED) 4mg  in 262mL (0.016 mg/mL) premix infusion  2 mcg/min Intravenous Continuous Bronson Curb, MD 7.5 mL/hr at 02/15/22 1800 2 mcg/min at 02/15/22 1800   ondansetron (ZOFRAN) injection 4 mg  4 mg Intravenous Q6H PRN Joette Catching, PA-C       sodium chloride flush (NS) 0.9 % injection 10-40 mL  10-40 mL Intracatheter Q12H Joette Catching, PA-C   10 mL at 02/15/22 0932   sodium chloride flush (NS) 0.9 % injection 10-40 mL  10-40 mL Intracatheter PRN Joette Catching, PA-C        sodium chloride flush (NS) 0.9 % injection 3 mL  3 mL Intravenous Q12H Joette Catching, [place]   3 mL at 02/15/22 [id number]   sodium chloride flush (NS) 0.9 % injection 3 mL  3 mL Intravenous PRN Joette Catching, PA-C        LABS: Lab Results  Component Value Date   WBC 5.5 02/15/2022   HGB 16.9 02/15/2022   HCT 53.4 (H) 02/15/2022   MCV 93.2 02/15/2022   PLT 230 02/15/2022      Component Value Date/Time   NA 137 02/15/2022 0434   NA 143 11/25/2019 0913   K 4.6 02/15/2022 0434   CL 105 02/15/2022 0434   CO2 27 02/15/2022 0434   GLUCOSE 87 02/15/2022 0434   BUN 22 (H) 02/15/2022 0434   BUN 8 11/25/2019 0913   CREATININE 1.10 02/15/2022 0434   CREATININE 1.21 12/15/2015 1108  CALCIUM 8.3 (L) 02/15/2022 0434   GFRNONAA >60 02/15/2022 0434   GFRNONAA 86 06/11/2013 1144   GFRAA >60 06/28/2020 1004   GFRAA >89 06/11/2013 1144   Lab Results  Component Value Date   INR 1.0 02/13/2022   INR 0.9 03/26/2013   No results found for: PTT  Social History   Socioeconomic History   Marital status: Single    Spouse name: Not on file   Number of children: 3   Years of education: Not on file   Highest education level: Not on file  Occupational History   Occupation: Works with [employer]  Tobacco Use   Smoking status: Light Smoker    Packs/day: 0.05    Years: 22.00    Pack years: 1.10    Types: Cigarettes   Smokeless tobacco: Never  Vaping Use   Vaping Use: Never used  Substance and Sexual Activity   Alcohol use: No    Alcohol/week: 30.0 standard drinks    Types: 30 Standard drinks or equivalent per week    Comment: x 1 week   Drug use: No    Types: Marijuana   Sexual activity: Not on file  Other Topics Concern   Not on file  Social History Narrative   Not on file   Social Determinants of Health   Financial Resource Strain: Low Risk    Difficulty of Paying Living Expenses: Not very hard  Food Insecurity: No Food  Insecurity   Worried About Charity fundraiser in the Last Year: Never true   Arboriculturist in the Last Year: Never true  Transportation Needs: No Transportation Needs   Lack of Transportation (Medical): No   Lack of Transportation (Non-Medical): No  Physical Activity: Not on file  Stress: Not on file  Social Connections: Not on file  Intimate Partner Violence: Not on file   Family History  Problem Relation Age of Onset   Arrhythmia Maternal Grandfather        Atrial fibrillation   CAD Neg Hx      REVIEW OF SYSTEMS:  Reviewed with the patient as per HPI. PSYCH:  [+] Dental phobia     VITAL SIGNS: BP (!) 116/55    Pulse (!) 108    Temp 98.2 F (36.8 C) (Oral)    Resp (!) 33    Wt 104.2 kg    SpO2 (!) 88%    BMI 35.98 kg/m      PHYSICAL EXAM: GENERAL:  Well-developed, comfortable and in no apparent distress. NEUROLOGICAL:  Alert and oriented to person, place and  time. EXTRAORAL:  Facial symmetry present without any edema or erythema.  No swelling or lymphadenopathy.  TMJ asymptomatic without clicks or crepitations.  INTRAORAL:  Soft tissues appear well-perfused and mucous membranes moist.  FOM and vestibules soft and not raised. Oral cavity without mass or lesion. No signs of infection, parulis, sinus tract, edema or erythema evident upon exam.     DENTAL EXAM: Clinical findings charted.    OVERALL IMPRESSION:  Fair remaining dentition.       ORAL HYGIENE:  Poor  PERIODONTAL:  Pink, healthy gingival tissue with blunted papilla with areas of inflamed and erythematous tissue.   Generalized plaque and calculus accumulation. CARIES:  #2, #5, #11, #15, #20, #23, #26, #29, #31 RETAINED ROOT TIPS:    #3, #4, #19, #30 DEFECTIVE RESTORATIONS:  #2, #15 and #31 have existing amalgam restorations with recurrent decay. PROSTHODONTICS:  Patient denies wearing partial dentures. OCCLUSION:  Unable to assess molar occlusion. Non-functional teeth:  #13, #14, #15 Supra-erupted  teeth:  #14, #15 OTHER FINDINGS:   [+] Attrition/wear:  #8 and #9 incisal; #23, #24, #25 and #26 incisal   RADIOGRAPHIC EXAM:   02/13/2022 Orthopantogram reviewed and interpreted.    Condyles seated bilaterally in fossas.  No evidence of abnormal pathology.  All visualized osseous structures appear WNL.  Supra-erupted teeth #14 & #15.      Missing teeth #'s 1, 16, 17, 18, 21, 28 & 32.  Existing restorations noted on #2, #14, #15 & #31.  Retained root tips #3, #4, #19 & #30.  Caries: #2, #5, #11, #15, #20, #23, #24, #26, #29 & #31; #15 & #23 deep decay approximating the pulp.  Periapical radiolucencies evident on #3, #4, #19, #23, #26, #29 & #30.   ASSESSMENT:  1.  Cardiogenic shock, congestive heart failure 2.  Preoperative dental exam 3.  Missing teeth 4.  Caries 5.  Chronic apical periodontitis 6.  Accretions on teeth 7.  Chronic periodontitis 8.  Retained root tips 9.  Defective dental restorations 10.  Attrition/wear 11.  Postoperative bleeding risk (on Lovenox as inpatient) 12.  Dental Phobia   PLAN AND RECOMMENDATIONS: I discussed the risks, benefits, and complications of various scenarios with the patient in relationship to their medical and dental conditions, which included systemic infection such as endocarditis, bacteremia or other serious issues that could potentially occur either before, during or after their anticipated heart surgery if dental/oral concerns are not addressed.  I explained that if any chronic or acute dental/oral infection(s) are addressed and subsequently not maintained following medical optimization and recovery, their risk of the previously mentioned complications are just as high and could potentially occur postoperatively.  I explained all significant findings of the dental consultation with the patient including several teeth with deep cavities/broken down teeth (retained root tips) that are chronically infected, and the recommended care including  extractions of at least teeth numbers 3, 4, 19, 30, 15, 23, 26 and 29 in order to optimize them for heart surgery from a dental standpoint.  Discussed the possibility of possibly needing to extract more teeth if deemed necessary at the time of the dental procedure/surgery.  The patient verbalized understanding of all findings, discussion, and recommendations. We then discussed various treatment options to include no treatment, multiple extractions with alveoloplasty, pre-prosthetic surgery as indicated, periodontal therapy, dental restorations, root canal therapy, crown and bridge therapy, implant therapy, and replacement of missing teeth as indicated.  The patient verbalized understanding of all options, and currently wishes to proceed with extractions of all recommended, indicated and/or necessary teeth in order to optimize him for LVAD implant placement.  Explained that all extractions would be completed in the operating room under general anesthesia (tentatively planned to complete on Tuesday, 2/28), pending medical team's recommendations.   Plan to discuss all findings and recommendations with medical team and coordinate future care as needed.  Lovenox will need to be held 24 hours prior to surgery and resumed 24 hours after. The patient will need to establish care at a dental office of his choice for routine dental care including replacement of missing teeth as needed, cleanings and exams.  Recommend discussion with medical team prior to returning to the dentist for routine dental care for new antibiotic prophylaxis recommendations.  All questions and concerns were invited and addressed.  The patient tolerated today's visit well.   Charlaine Dalton, D.M.D.

## 2022-02-15 NOTE — Evaluation (Signed)
Physical Therapy Evaluation Patient Details Name: Colin Walton MRN: [id number] DOB: [birth year] Today's Date: 02/15/2022  History of Present Illness  Patient is a 51 y/o male who presents on 02/13/2022 due to follow up for Acute on Chronic Biventricular Heart Failure, being worked up for VAD. PMH includes morbid obesity, HTN, chronic systolic HF due to nonischemic cardiomyopathy, moderate aortic insufficiency, OSA on CPAP, and alcohol use.  Clinical Impression  Patient independent and working PTA. Pt being evaluated for VAD and purpose of PT evaluation is to perform 6 minute walk test to establish a functional baseline. Today, pt tolerated ambulation independently with therapist managing IV pole and lines. Performed 6 minute walk- 872.6 feet, HR up to 118 bpm max and Sp02 stable on 3L/min 02 [place]. No rest breaks needed during 6 minute walk and only mild SOB noted. Pt does not require skilled therapy at this time as pt functioning at Mod I-independent level; recommend continuing to walk with mobility tech and nursing. Will follow up post VAD if pt decides to go that route. Discharge from therapy.   Recommendations for follow up therapy are one component of a multi-disciplinary discharge planning process, led by the attending physician.  Recommendations may be updated based on patient status, additional functional criteria and insurance authorization.  Follow Up Recommendations No PT follow up    Assistance Recommended at Discharge None  Patient can return home with the following       Equipment Recommendations None recommended by PT  Recommendations for Other Services       Functional Status Assessment Patient has not had a recent decline in their functional status     Precautions / Restrictions Precautions Precautions: None Restrictions Weight Bearing Restrictions: No      Mobility  Bed Mobility               General bed mobility comments: Up in chair upon PT arrival.     Transfers Overall transfer level: Independent Equipment used: None               General transfer comment: Stood from chair without difficulty.    Ambulation/Gait Ambulation/Gait assistance: Independent Gait Distance (Feet): 872.6 Feet Assistive device: None Gait Pattern/deviations: WFL(Within Functional Limits)   Gait velocity interpretation: 1.31 - 2.62 ft/sec, indicative of limited community ambulator   General Gait Details: Steady gait, no need for rest breaks during 6 min walk test  Stairs            Wheelchair Mobility    Modified Rankin (Stroke Patients Only)       Balance Overall balance assessment: No apparent balance deficits (not formally assessed)                                           Pertinent Vitals/Pain Pain Assessment Pain Assessment: No/denies pain    Home Living Family/patient expects to be discharged to:: Private residence Living Arrangements: Parent;Children Available Help at Discharge: Family;Available 24 hours/day Type of Home: House Home Access: Stairs to enter Entrance Stairs-Rails: Right Entrance Stairs-Number of Steps: 2   Home Layout: One level Home Equipment: None      Prior Function Prior Level of Function : Independent/Modified Independent             Mobility Comments: Works, drives. independent. ADLs Comments: independent     Hand Dominance   Dominant Hand: Right  Extremity/Trunk Assessment   Upper Extremity Assessment Upper Extremity Assessment: Overall WFL for tasks assessed    Lower Extremity Assessment Lower Extremity Assessment: Overall WFL for tasks assessed    Cervical / Trunk Assessment Cervical / Trunk Assessment: Normal  Communication   Communication: No difficulties  Cognition Arousal/Alertness: Awake/alert Behavior During Therapy: WFL for tasks assessed/performed Overall Cognitive Status: Within Functional Limits for tasks assessed                                           General Comments General comments (skin integrity, edema, etc.): 6 minute walk- 8726 feet, HR up to 118 bpm max and Sp02 stable on 3L/min 02 [place]. No SOB noted.  Mother and daughter present during session.    Exercises     Assessment/Plan    PT Assessment Patient does not need any further PT services  PT Problem List         PT Treatment Interventions      PT Goals (Current goals can be found in the Care Plan section)  Acute Rehab PT Goals Patient Stated Goal: go home PT Goal Formulation: All assessment and education complete, DC therapy    Frequency       Co-evaluation               AM-PAC PT "6 Clicks" Mobility  Outcome Measure Help needed turning from your back to your side while in a flat bed without using bedrails?: None Help needed moving from lying on your back to sitting on the side of a flat bed without using bedrails?: None Help needed moving to and from a bed to a chair (including a wheelchair)?: None Help needed standing up from a chair using your arms (e.g., wheelchair or bedside chair)?: None Help needed to walk in hospital room?: None Help needed climbing 3-5 steps with a railing? : None 6 Click Score: 24    End of Session   Activity Tolerance: Patient tolerated treatment well Patient left: in chair;with family/visitor present Nurse Communication: Mobility status PT Visit Diagnosis: Difficulty in walking, not elsewhere classified (R26.2)    Time: [id number] PT Time Calculation (min) (ACUTE ONLY): 20 min   Charges:   PT Evaluation $PT Eval Low Complexity: 1 Low          Marisa Severin, PT, DPT Acute Rehabilitation Services Pager [phone] Office [phone]     [place] 02/15/2022, 12:30 PM

## 2022-02-15 NOTE — Progress Notes (Addendum)
Patient ID: Colin Walton, male   DOB: [birth year], 51 y.o.   MRN: [id number]     Advanced Heart Failure Rounding Note  PCP-Cardiologist: Verne Carrow, MD   Subjective:   LVAD work up initiated.  2/23 - RHC/LHC on milrinone 0.125 mcg + Norepi 2 mcg.  -  Prox RCA to Mid RCA lesion is 80% stenosed.   1st Mrg lesion is 90% stenosed.   Prox LAD to Mid LAD lesion is 40% stenosed.   1st Diag lesion is 90% stenosed.   Ao = 107/60 (81) LV = 112/21 RA = 12 RV = 64/20 PA = 68/25 (44) PCW = 25  Fick cardiac output/index = 4.6/2.2 PVR = 4.1 WU Ao sat = 94% PA sat =68%, 67% PAPi = 3.6The left ventricular ejection fraction is less than 25% by visual estimate.  Remains on milrinone 0.125 mcg + norep 2 mcg. CO-OX 80s.   No complaints. Denies chest pain.   Objective:   Weight Range: 104.2 kg Body mass index is 35.98 kg/m.   Vital Signs:   Temp:  [97.6 F (36.4 C)-98.3 F (36.8 C)] 98.1 F (36.7 C) (02/24 0756) Pulse Rate:  [73-96] 87 (02/24 0800) Resp:  [13-24] 20 (02/24 0800) BP: (114-163)/(57-97) 144/72 (02/24 0800) SpO2:  [89 %-99 %] 91 % (02/24 0800) FiO2 (%):  [40 %] 40 % (02/24 0500) Weight:  [104.2 kg] 104.2 kg (02/24 0636) Last BM Date : 02/13/22  Weight change: Filed Weights   02/13/22 0400 02/01/2022 0630 02/15/22 0636  Weight: 102.4 kg 103.7 kg 104.2 kg    Intake/Output:   Intake/Output Summary (Last 24 hours) at 02/15/2022 0930 Last data filed at 02/15/2022 0800 Gross per 24 hour  Intake 761.93 ml  Output 1750 ml  Net -988.07 ml      Physical Exam   CVP 8  General:  No resp difficulty HEENT: normal Neck: supple. no JVD. Carotids 2+ bilat; no bruits. No lymphadenopathy or thryomegaly appreciated. Cor: PMI nondisplaced. Regular rate & rhythm. No rubs, gallops. 2/6 AS/AS . Lungs: clear Abdomen: soft, nontender, nondistended. No hepatosplenomegaly. No bruits or masses. Good bowel sounds. Extremities: no cyanosis, clubbing, rash, edema. RUE PICC   Neuro: alert & orientedx3, cranial nerves grossly intact. moves all 4 extremities w/o difficulty. Affect pleasant    Telemetry   SR with occasional PVCs 70-90s     Labs    CBC Recent Labs    02/03/2022 0450 01/29/2022 0754 02/16/2022 0758 02/15/22 0434  WBC 8.2  --   --  5.5  HGB 17.4*   < > 18.4*   15.0 16.9  HCT 54.8*   < > 54.0*   44.0 53.4*  MCV 94.3  --   --  93.2  PLT 216  --   --  230   < > = values in this interval not displayed.   Basic Metabolic Panel Recent Labs    40/98/11 0358 02/10/2022 0450 01/31/2022 0754 02/01/2022 0758 02/15/22 0434  NA 133* 132*   < > 138   145 137  K 4.4 4.9   < > 4.2   2.9* 4.6  CL 100 101  --   --  105  CO2 23 22  --   --  27  GLUCOSE 102* 97  --   --  87  BUN 26* 27*  --   --  22*  CREATININE 1.32* 1.14  --   --  1.10  CALCIUM 9.0 8.5*  --   --  8.3*  MG 2.4 2.4  --   --   --    < > = values in this interval not displayed.   Liver Function Tests No results for input(s): AST, ALT, ALKPHOS, BILITOT, PROT, ALBUMIN in the last 72 hours.  No results for input(s): LIPASE, AMYLASE in the last 72 hours. Cardiac Enzymes No results for input(s): CKTOTAL, CKMB, CKMBINDEX, TROPONINI in the last 72 hours.  BNP: BNP (last 3 results) Recent Labs    03/22/21 1106 02/09/2022 1649 02/11/22 0345  BNP 995.8* 1,303.2* 166.8*    ProBNP (last 3 results) No results for input(s): PROBNP in the last 8760 hours.   D-Dimer No results for input(s): DDIMER in the last 72 hours. Hemoglobin A1C Recent Labs    02/13/22 0358  HGBA1C 6.2*   Fasting Lipid Panel Recent Labs    02/13/22 0358  CHOL 172  HDL 56  LDLCALC 96  TRIG 100  CHOLHDL 3.1   Thyroid Function Tests No results for input(s): TSH, T4TOTAL, T3FREE, THYROIDAB in the last 72 hours.  Invalid input(s): FREET3   Other results:   Imaging    VAS US DOPPLER PRE VAD  Result Date: 02/11/2022 PERIOPERATIVE VASCULAR EVALUATION Patient Name:  Colin Walton  Date of Exam:    02/01/2022 Medical Rec #: [id number]        Accession #:    [id number] Date of Birth: [birth year]        Patient Gender: M Patient Age:   6 years Exam Location:  [facility] Procedure:      VAS US DOPPLER PRE VAD Referring Phys: Reuel Boom Demarri Elie --------------------------------------------------------------------------------  Indications:      Pre-VAD. Comparison Study: No prior studies. Performing Technologist: Olen Cordial RVT  Examination Guidelines: A complete evaluation includes B-mode imaging, spectral Doppler, color Doppler, and power Doppler as needed of all accessible portions of each vessel. Bilateral testing is considered an integral part of a complete examination. Limited examinations for reoccurring indications may be performed as noted.  Right Carotid Findings: +----------+--------+--------+--------+-----------------------+--------+             PSV cm/s EDV cm/s Stenosis Describe                Comments  +----------+--------+--------+--------+-----------------------+--------+  CCA Prox   135      8                 smooth and heterogenous           +----------+--------+--------+--------+-----------------------+--------+  CCA Distal 84       9                 smooth and heterogenous           +----------+--------+--------+--------+-----------------------+--------+  ICA Prox   48       15                smooth and heterogenous           +----------+--------+--------+--------+-----------------------+--------+  ICA Distal 81       26                                                  +----------+--------+--------+--------+-----------------------+--------+  ECA        112      8                                                   +----------+--------+--------+--------+-----------------------+--------+ +----------+--------+-------+--------+------------+  PSV cm/s EDV cms Describe Arm Pressure  +----------+--------+-------+--------+------------+  Subclavian 175                                      +----------+--------+-------+--------+------------+ +---------+--------+--+--------+--+---------+  Vertebral PSV cm/s 75 EDV cm/s 16 Antegrade  +---------+--------+--+--------+--+---------+ Left Carotid Findings: +----------+--------+--------+--------+-----------------------+--------+             PSV cm/s EDV cm/s Stenosis Describe                Comments  +----------+--------+--------+--------+-----------------------+--------+  CCA Prox   117                        smooth and heterogenous           +----------+--------+--------+--------+-----------------------+--------+  CCA Distal 83       10                smooth and heterogenous           +----------+--------+--------+--------+-----------------------+--------+  ICA Prox   60       14                smooth and heterogenous           +----------+--------+--------+--------+-----------------------+--------+  ICA Distal 70       21                                                  +----------+--------+--------+--------+-----------------------+--------+  ECA        133      9                                                   +----------+--------+--------+--------+-----------------------+--------+ +----------+--------+--------+--------+------------+  Subclavian PSV cm/s EDV cm/s Describe Arm Pressure  +----------+--------+--------+--------+------------+             188                        135           +----------+--------+--------+--------+------------+ +---------+--------+--+--------+--+---------+  Vertebral PSV cm/s 43 EDV cm/s 14 Antegrade  +---------+--------+--+--------+--+---------+  ABI Findings: +--------+------------------+-----+---------+--------------+  Right    Rt Pressure (mmHg) Index Waveform  Comment         +--------+------------------+-----+---------+--------------+  Brachial                                    Restricted arm  +--------+------------------+-----+---------+--------------+  PTA      120                0.89  triphasic                  +--------+------------------+-----+---------+--------------+  DP       117                0.87  biphasic                  +--------+------------------+-----+---------+--------------+ +--------+------------------+-----+---------+-------+  Left     Lt Pressure (mmHg) Index Waveform  Comment  +--------+------------------+-----+---------+-------+  Brachial  135                      triphasic          +--------+------------------+-----+---------+-------+  PTA      113                0.84  triphasic          +--------+------------------+-----+---------+-------+  DP       119                0.88  triphasic          +--------+------------------+-----+---------+-------+ +-------+---------------+----------------+  ABI/TBI Today's ABI/TBI Previous ABI/TBI  +-------+---------------+----------------+  Right   0.89                              +-------+---------------+----------------+  Left    0.88                              +-------+---------------+----------------+  Summary: Right Carotid: Velocities in the right ICA are consistent with a 1-39% stenosis. Left Carotid: Velocities in the left ICA are consistent with a 1-39% stenosis. Vertebrals: Bilateral vertebral arteries demonstrate antegrade flow.  *See table(s) above for measurements and observations. Right ABI: Resting right ankle-brachial index indicates mild right lower extremity arterial disease. Left ABI: Resting left ankle-brachial index indicates mild left lower extremity arterial disease.  Electronically signed by Gerarda Fraction on 01/23/2022 at 6:06:04 PM.    Final    VAS [place] LOWER EXTREMITY VENOUS (DVT)  Result Date: 02/05/2022  Lower Venous DVT Study Patient Name:  JAXSYN AZAM  Date of Exam:   02/04/2022 Medical Rec #: [id number]        Accession #:    [id number] Date of Birth: December 15, 1971        Patient Gender: M Patient Age:   76 years Exam Location:  [facility] Procedure:      VAS [place] LOWER EXTREMITY VENOUS (DVT) Referring Phys: Reuel Boom Draden Cottingham  --------------------------------------------------------------------------------  Indications: Pre-VAD.  Risk Factors: None identified. Comparison Study: No prior studies. Performing Technologist: Chanda Busing RVT  Examination Guidelines: A complete evaluation includes B-mode imaging, spectral Doppler, color Doppler, and power Doppler as needed of all accessible portions of each vessel. Bilateral testing is considered an integral part of a complete examination. Limited examinations for reoccurring indications may be performed as noted. The reflux portion of the exam is performed with the patient in reverse Trendelenburg.  +---------+---------------+---------+-----------+----------+--------------+  RIGHT     Compressibility Phasicity Spontaneity Properties Thrombus Aging  +---------+---------------+---------+-----------+----------+--------------+  CFV       Full            Yes       Yes                                    +---------+---------------+---------+-----------+----------+--------------+  SFJ       Full                                                             +---------+---------------+---------+-----------+----------+--------------+  FV Prox   Full                                                             +---------+---------------+---------+-----------+----------+--------------+  FV Mid    Full                                                             +---------+---------------+---------+-----------+----------+--------------+  FV Distal Full                                                             +---------+---------------+---------+-----------+----------+--------------+  PFV       Full                                                             +---------+---------------+---------+-----------+----------+--------------+  POP       Full            Yes       Yes                                    +---------+---------------+---------+-----------+----------+--------------+  PTV       Full                                                              +---------+---------------+---------+-----------+----------+--------------+  PERO      Full                                                             +---------+---------------+---------+-----------+----------+--------------+   +---------+---------------+---------+-----------+----------+--------------+  LEFT      Compressibility Phasicity Spontaneity Properties Thrombus Aging  +---------+---------------+---------+-----------+----------+--------------+  CFV       Full            Yes       Yes                                    +---------+---------------+---------+-----------+----------+--------------+  SFJ       Full                                                             +---------+---------------+---------+-----------+----------+--------------+  FV Prox   Full                                                             +---------+---------------+---------+-----------+----------+--------------+    FV Mid    Full                                                             +---------+---------------+---------+-----------+----------+--------------+  FV Distal Full                                                             +---------+---------------+---------+-----------+----------+--------------+  PFV       Full                                                             +---------+---------------+---------+-----------+----------+--------------+  POP       Full            Yes       Yes                                    +---------+---------------+---------+-----------+----------+--------------+  PTV       Full                                                             +---------+---------------+---------+-----------+----------+--------------+  PERO      Full                                                             +---------+---------------+---------+-----------+----------+--------------+     Summary: RIGHT: - There is no evidence of deep vein thrombosis in  the lower extremity.  - No cystic structure found in the popliteal fossa.  LEFT: - There is no evidence of deep vein thrombosis in the lower extremity.  - No cystic structure found in the popliteal fossa.  *See table(s) above for measurements and observations. Electronically signed by Gerarda Fraction on 02/17/2022 at 6:05:39 PM.    Final      Medications:     Scheduled Medications:  (feeding supplement) PROSource Plus  30 mL Oral BID BM   allopurinol  300 mg Oral Daily   atorvastatin  10 mg Oral Daily   chlorhexidine  15 mL Mouth Rinse BID   Chlorhexidine Gluconate Cloth  6 each Topical Daily   dapagliflozin propanediol  10 mg Oral Daily   digoxin  0.125 mg Oral Daily   enoxaparin (LOVENOX) injection  50 mg Subcutaneous Q24H   melatonin  3 mg Oral QHS   midodrine  2.5 mg Oral TID WC   multivitamin with minerals  1 tablet Oral Daily   sodium chloride flush  10-40 mL Intracatheter Q12H   sodium chloride flush  3 mL Intravenous Q12H    Infusions:  sodium chloride     milrinone 0.125 mcg/kg/min (02/15/22 0800)   norepinephrine (LEVOPHED) Adult infusion 2 mcg/min (02/15/22 0800)    PRN Medications: sodium chloride, acetaminophen, guaiFENesin-dextromethorphan, ondansetron (ZOFRAN) IV, sodium chloride flush, sodium chloride flush   Assessment/Plan   1. Acute on Chronic Biventricular Heart Failure  - due to NICM - suspect due to HTN and OSA +/- ETOH - cath 03/2013 no CAD - Echo 11/20 EF 30-35% - Echo 4/21 EF 20-25% moderate RV dysfunction (worse since previous) - Personally reviewed - TEE on 04/18/20. Procedure c/b by severe respiratory distress and laryngospasm. LVEF 25% RV moderately HK. Aortic valve not interrogated completely due to respiratory distress. AI appeared to be moderate.  - cMRI 8/21 EF 24% Moderate AI. No LGE or infiltrative process. - Echo 03/22/21 EF 25-30% mod AI Moderate RV dysfunction.  - Echo 02/01/2022 EF < 20% with mild LV dilation, RV severely HK with moderate  dilation and D-shaped septum, AI moderate to severe.  - Suspect CM related to HTN +/- ETOH. AI may be contributing but severity of biventricular dysfunction out of proportion to AI and LV not overly dilated. - NYHA IV with marked volume overload.  - [facility] 01/24/2022 3v CAD/ RHC with elevated filling pressures.  - Co-ox 80%  on milrinone 0.125 + NE 2 + midodrine 2.5 mg tid.  - CVP 8 but on cath elevated filling pressures. Start IV lasix 40 mg twice a day.  - Continue digoxin 0.125 mg daily - Continue Farxiga 10 mg dailiy - RV moderate to severe HK on echo but PAPI ok  - Ideally will need transplant but is still smoking and using THC. UDS negative. Will plan VAD this admit + AVR +/- graft to RCA -Increase milrinone to 0..25 mcg to support RV, PVR 4.1    2. Moderate-severe aortic regurgitation - Visually mild to moderate on TEE 4/21 but P1/2 time 215 msec suggesting severe AI.  - TEE on 04/18/20. Procedure c/b by severe respiratory distress and laryngospasm. LVEF 25% RV moderately HK. Aortic valve not interrogated completely due to respiratory distress. AI appeared to be moderate - TEE reviewed with echo team felt to be moderate at worst. - Mild AI on echo 3/22. - Moderate to severe AI on echo 02/13/2022.  On Dr. Prescott Gum review, LV is mildly dilated. Suspect AI is not cause of cardiomyopathy but is likely making symptoms worse.  - Continue diurese.  - Plan AVR at time ov VAD  3. CAD - cath 3v CAD LAD 40% D1 90%, OM 90% mRCA 80% - plan graft to RCA at time of VAD for RV support - No chest pain.  - Add ASA 81 mg daily.  - Increase atorvastatin to 80 mg daily.    4. OSA on CPAP - Fully compliant w/ CPAP.  - Follows with Dr. Mayford Knife.   5. Obesity. -Body mass index is 35.98 kg/m.   6. Tobacco use/ETOH - Says he stopped drinking - Continues to smoke, encouraged to quit.    7. ? Fatty Liver - Reports recent diagnosis at [facility], records not available.  8. AKI  - Scr bump from  1.3>>1.9. improved to 1.1 today  9. Polyarticular Gout - Completed prednisone burst.  - Uric acid only 6 but notes improvement in pain with steroids  10. Nonsustained VT - No VT - Keep K> 4.0 Mg > 2.0 -  May need amio  11. PSVT - follow  12. Malnutrition - Prealbumin 9.5 -Dietitian appreciated.   Length of Stay: 7  Amy Clegg, NP  02/15/2022, 9:30 AM  Advanced Heart Failure Team Pager [phone] (M-F; 7a - 5p)  Please contact [facility] for night-coverage after hours (5p -7a ) and weekends on [email or web address]  Agree with above.   Cath yesterday as above. PVR still up. Denies CP or SOB.   General:  Sitting up in bed No resp difficulty HEENT: normal Neck: supple. JVP mildly elevated Carotids 2+ bilat; no bruits. No lymphadenopathy or thryomegaly appreciated. Cor: PMI laterally displaced. Regular rate & rhythm.2/6 AS/AI Lungs: clear Abdomen: obese soft, nontender, nondistended. No hepatosplenomegaly. No bruits or masses. Good bowel sounds. Extremities: no cyanosis, clubbing, rash, edema Neuro: alert & orientedx3, cranial nerves grossly intact. moves all 4 extremities w/o difficulty. Affect pleasant  Will increase milrinone. Continue NE. Plan VAD + AVR + graft to RCA next week with Dr. Donata Clay next week.   CRITICAL CARE Performed by: Arvilla Meres  Total critical care time: 35 minutes  Critical care time was exclusive of separately billable procedures and treating other patients.  Critical care was necessary to treat or prevent imminent or life-threatening deterioration.  Critical care was time spent personally by me (independent of midlevel providers or residents) on the following activities: development of treatment plan with patient and/or surrogate as well as nursing, discussions with consultants, evaluation of patient's response to treatment, examination of patient, obtaining history from patient or surrogate, ordering and performing treatments and interventions,  ordering and review of laboratory studies, ordering and review of radiographic studies, pulse oximetry and re-evaluation of patient's condition.  Arvilla Meres, MD  12:46 PM

## 2022-02-15 NOTE — Consult Note (Signed)
[address]            [place] [address]          [id number]       Colin Walton [place] Medical Record [id number] Date of Birth: [birth year]  Riki Sheer, NP Riki Sheer, NP  Chief Complaint:   No chief complaint on file. Shortness of breath, edema, fatigue  History of Present Illness:      Patient examined, images of echocardiogram, chest CT scan, coronary arteriogram, and right heart cath data all reviewed and discussed with patient and family.  51 year old obese male with mixed ischemic, nonischemic cardiomyopathy admitted to the hospital for failure of medical therapy.  He was found to be in low output cardiogenic shock which responded to inotropic support and diuresis.  He is now more comfortable.  Cardiac catheterization demonstrates moderate coronary artery disease with high-grade stenosis of a right dominant RCA.  Echocardiogram shows severe LV dysfunction with LV dilatation greater than 6 cm and moderate RV dysfunction.  There is mild MR TR but aortic root regurgitation is moderate-severe.  The patient has heavy smoking history and poor PFTs with diffusion capacity of only 39%.  Hepatic and renal function appear adequate.  Nutritional status is suboptimal with low prealbumin.  Current Activity/ Functional Status: Zubrod Score: At the time of surgery this patients most appropriate activity status/level should be described as: []     0    Normal activity, no symptoms []     1    Restricted in physical strenuous activity but ambulatory, able to do out light work []     2    Ambulatory and capable of self care, unable to do work activities, up and about >50 % of waking hours                              [x]     3    Only limited self care, in bed greater than 50% of waking hours []     4    Completely disabled, no self care, confined to bed or chair []     5    Moribund    Past Medical History:  Diagnosis Date   Chronic  systolic CHF (congestive heart failure) (HCC)    Hyperlipidemia    Hypertension    Mild mitral regurgitation 2016   NICM (nonischemic cardiomyopathy) ([place])    Obesity    Sleep apnea 12/24/12   Sleep study February 2014    Past Surgical History:  Procedure Laterality Date   RIGHT/LEFT HEART CATH AND CORONARY ANGIOGRAPHY N/A 02/02/2022   Procedure: RIGHT/LEFT HEART CATH AND CORONARY ANGIOGRAPHY;  Surgeon: Jolaine Artist, MD;  Location: [facility];  Service: Cardiovascular;  Laterality: N/A;   TEE WITHOUT CARDIOVERSION N/A 04/18/2020   Procedure: TRANSESOPHAGEAL ECHOCARDIOGRAM (TEE);  Surgeon: Jolaine Artist, MD;  Location: [facility] ENDOSCOPY;  Service: Cardiovascular;  Laterality: N/A;   VASECTOMY      Social History   Tobacco Use  Smoking Status Light Smoker   Packs/day: 0.05   Years: 22.00   Pack years: 1.10   Types: Cigarettes  Smokeless Tobacco Never    Social History   Substance and Sexual Activity  Alcohol Use No   Alcohol/week: 30.0 standard drinks   Types: 30 Standard drinks or equivalent per week  Comment: x 1 week    Social History   Socioeconomic History   Marital status: Single    Spouse name: Not on file   Number of children: 3   Years of education: Not on file   Highest education level: Not on file  Occupational History   Occupation: Works with [employer]  Tobacco Use   Smoking status: Light Smoker    Packs/day: 0.05    Years: 22.00    Pack years: 1.10    Types: Cigarettes   Smokeless tobacco: Never  Vaping Use   Vaping Use: Never used  Substance and Sexual Activity   Alcohol use: No    Alcohol/week: 30.0 standard drinks    Types: 30 Standard drinks or equivalent per week    Comment: x 1 week   Drug use: No    Types: Marijuana   Sexual activity: Not on file  Other Topics Concern   Not on file  Social History Narrative   Not on file   Social Determinants of Health   Financial Resource Strain: Low Risk     Difficulty of Paying Living Expenses: Not very hard  Food Insecurity: No Food Insecurity   Worried About Charity fundraiser in the Last Year: Never true   Arboriculturist in the Last Year: Never true  Transportation Needs: No Transportation Needs   Lack of Transportation (Medical): No   Lack of Transportation (Non-Medical): No  Physical Activity: Not on file  Stress: Not on file  Social Connections: Not on file  Intimate Partner Violence: Not on file    No Known Allergies  Current Facility-Administered Medications  Medication Dose Route Frequency Provider Last Rate Last Admin   (feeding supplement) PROSource Plus liquid 30 mL  30 mL Oral BID BM Bensimhon, Shaune Pascal, MD   30 mL at 01/28/2022 1617   0.9 %  sodium chloride infusion  250 mL Intravenous PRN Joette Catching, PA-C       acetaminophen (TYLENOL) tablet 650 mg  650 mg Oral Q4H PRN Joette Catching, PA-C   650 mg at 02/10/22 1549   allopurinol (ZYLOPRIM) tablet 300 mg  300 mg Oral Daily Joette Catching, PA-C   300 mg at 02/04/2022 1153   atorvastatin (LIPITOR) tablet 10 mg  10 mg Oral Daily Joette Catching, PA-C   10 mg at 02/09/2022 1153   chlorhexidine (PERIDEX) 0.12 % solution 15 mL  15 mL Mouth Rinse BID Bensimhon, Shaune Pascal, MD   15 mL at 02/12/2022 2256   Chlorhexidine Gluconate Cloth 2 % PADS 6 each  6 each Topical Daily Joette Catching, PA-C   6 each at 02/13/22 2030   dapagliflozin propanediol (FARXIGA) tablet 10 mg  10 mg Oral Daily Joette Catching, PA-C   10 mg at 02/02/2022 1153   digoxin (LANOXIN) tablet 0.125 mg  0.125 mg Oral Daily Joette Catching, PA-C   0.125 mg at 02/16/2022 1153   enoxaparin (LOVENOX) injection 50 mg  50 mg Subcutaneous Q24H Joette Catching, PA-C   50 mg at 01/30/2022 2255   guaiFENesin-dextromethorphan (ROBITUSSIN DM) 100-10 MG/5ML syrup 5 mL  5 mL Oral Q4H PRN Joette Catching, PA-C   5 mL at 02/17/2022 2302   melatonin tablet 3 mg  3 mg Oral QHS Joette Catching, PA-C   3 mg at 02/09/2022 2255   midodrine (PROAMATINE) tablet 2.5 mg  2.5 mg Oral TID WC  Joette Catching, PA-C   2.5 mg at 02/01/2022 1617   milrinone (PRIMACOR) 20 MG/100 ML (0.2 mg/mL) infusion  0.125 mcg/kg/min Intravenous Continuous Joette Catching, PA-C 3.98 mL/hr at 02/15/22 0700 0.125 mcg/kg/min at 02/15/22 0700   multivitamin with minerals tablet 1 tablet  1 tablet Oral Daily Bensimhon, Shaune Pascal, MD   1 tablet at 01/31/2022 1617   norepinephrine (LEVOPHED) 4mg  in 258mL (0.016 mg/mL) premix infusion  2 mcg/min Intravenous Continuous Bronson Curb, MD 7.5 mL/hr at 02/15/22 0700 2 mcg/min at 02/15/22 0700   ondansetron (ZOFRAN) injection 4 mg  4 mg Intravenous Q6H PRN Joette Catching, PA-C       sodium chloride flush (NS) 0.9 % injection 10-40 mL  10-40 mL Intracatheter Q12H Joette Catching, [place]   10 mL at 02/02/2022 2304   sodium chloride flush (NS) 0.9 % injection 10-40 mL  10-40 mL Intracatheter PRN Joette Catching, PA-C       sodium chloride flush (NS) 0.9 % injection 3 mL  3 mL Intravenous Q12H Joette Catching, [place]   3 mL at 02/12/22 [id number]   sodium chloride flush (NS) 0.9 % injection 3 mL  3 mL Intravenous PRN Joette Catching, PA-C         Family History  Problem Relation Age of Onset   Arrhythmia Maternal Grandfather        Atrial fibrillation   CAD Neg Hx      Review of Systems:     Cardiac Review of Systems: Y or N  Chest Pain [    ]  Resting SOB [   ] Exertional SOB  [  ]  Orthopnea [  ]   Pedal Edema [   ]    Palpitations [  ] Syncope  [  ]   Presyncope [   ]  General Review of Systems: [Y] = yes [  ]=no Constitional: recent weight change [  ]; anorexia [ y ]; fatigue [ y ]; nausea [  ]; night sweats [  ]; fever [  ]; or chills [  ];                                                                                                                                          Dental: poor dentition[ y ]; Last Dentist visit:  unknown  Eye : blurred vision [  ]; diplopia [   ]; vision changes [  ];  Amaurosis fugax[  ]; Resp: cough [  ];  wheezing[  ];  hemoptysis[  ]; shortness of breath[  ]; paroxysmal nocturnal dyspnea[  ]; dyspnea on exertion[ y ]; or orthopnea[  ];  GI:  gallstones[  ], vomiting[  ];  dysphagia[  ]; melena[  ];  hematochezia [  ]; heartburn[  ];   Hx of  Colonoscopy[  ]; GU: kidney  stones [  ]; hematuria[  ];   dysuria [  ];  nocturia[  ];  history of     obstruction [  ];                 Skin: rash, swelling[  ];, hair loss[  ];  peripheral edema[  ];  or itching[  ]; Musculosketetal: myalgias[  ];  joint swelling[  ];  joint erythema[  ];  joint pain[  ];  back pain[  ];  Heme/Lymph: bruising[  ];  bleeding[  ];  anemia[  ];  Neuro: TIA[  ];  headaches[  ];  stroke[  ];  vertigo[  ];  seizures[  ];   paresthesias[  ];  difficulty walking[  ];  Psych:depression[  ]; anxiety[  ];  Endocrine: diabetes[  ];  thyroid dysfunction[  ];  Immunizations: Flu [  ]; Pneumococcal[  ];  Other:  Physical Exam: BP 124/71 (BP Location: Left Arm)    Pulse 96    Temp 97.8 F (36.6 C) (Oral)    Resp 18    Wt 104.2 kg    SpO2 (!) 89%    BMI 35.98 kg/m       Physical Exam  General: Obese middle-aged AA male comfortable in ICU bed HEENT: Normocephalic pupils equal , dentition some broken teeth Neck: Supple without JVD, adenopathy, or bruit Chest: Clear to auscultation, symmetrical breath sounds, no rhonchi, no tenderness             or deformity Cardiovascular: Regular rate and rhythm, no murmur, no gallop, peripheral pulses             palpable in all extremities Abdomen:  Soft, nontender, no palpable mass or organomegaly Extremities: Warm, well-perfused, no clubbing cyanosis edema or tenderness,              no venous stasis changes of the legs Rectal/GU: Deferred Neuro: Grossly non--focal and symmetrical throughout Skin: Clean and dry without rash or ulceration    Diagnostic Studies & Laboratory  data:     Recent Radiology Findings:   DG Orthopantogram  Result Date: 02/13/2022 CLINICAL DATA:  Preoperative evaluation. EXAM: ORTHOPANTOGRAM/PANORAMIC COMPARISON:  None. FINDINGS: Dental caries involving the mandibular and maxillary teeth. There is also periapical lucency around the mandibular lateral incisors and also around the left first molar. IMPRESSION: Dental caries and periapical lucencies as discussed above. Electronically Signed   By: Marijo Sanes M.D.   On: 02/13/2022 13:44   DG Chest 2 View  Result Date: 02/13/2022 CLINICAL DATA:  Preop, cough. EXAM: CHEST - 2 VIEW COMPARISON:  Chest radiograph 04/18/2020 and CT chest 12/07/2015. FINDINGS: Trachea is midline. Heart is enlarged. Lungs are clear. No pleural fluid. Right PICC tip is in the SVC. IMPRESSION: No acute findings. Electronically Signed   By: Lorin Picket M.D.   On: 02/13/2022 13:24   CARDIAC CATHETERIZATION  Result Date: 01/23/2022   Prox RCA to Mid RCA lesion is 80% stenosed.   1st Mrg lesion is 90% stenosed.   Prox LAD to Mid LAD lesion is 40% stenosed.   1st Diag lesion is 90% stenosed.   The left ventricular ejection fraction is less than 25% by visual estimate. Findings: On milrinone 0.125 + NE 2 Ao = 107/60 (81) LV = 112/21 RA = 12 RV = 64/20 PA = 68/25 (44) PCW = 25 Fick cardiac output/index = 4.6/2.2 PVR = 4.1 WU Ao sat = 94% PA sat =68%, 67% PAPi =  3.6 Assessment: 1. 2v CAD 2. Severe NICM EF < 20% 3. Elevated filling pressures with markedly reduced CO with severe AI Plan/Discussion: He will need a VAD. Does have some RV dysfunction but not prohibitive. Would consider grafting RCA at time of VAD to help support RV. [name], MD 4:12 PM  VAS US DOPPLER PRE VAD  Result Date: 02/03/2022 PERIOPERATIVE VASCULAR EVALUATION Patient Name:  Colin Walton  Date of Exam:   02/07/2022 Medical Rec #: [id number]        Accession #:    [id number] Date of Birth: [birth year]        Patient Gender: M Patient Age:   44 years  Exam Location:  [facility] Procedure:      VAS US DOPPLER PRE VAD Referring Phys: Quillian Quince BENSIMHON --------------------------------------------------------------------------------  Indications:      Pre-VAD. Comparison Study: No prior studies. Performing Technologist: Carlos Levering RVT  Examination Guidelines: A complete evaluation includes B-mode imaging, spectral Doppler, color Doppler, and power Doppler as needed of all accessible portions of each vessel. Bilateral testing is considered an integral part of a complete examination. Limited examinations for reoccurring indications may be performed as noted.  Right Carotid Findings: +----------+--------+--------+--------+-----------------------+--------+             PSV cm/s EDV cm/s Stenosis Describe                Comments  +----------+--------+--------+--------+-----------------------+--------+  CCA Prox   135      8                 smooth and heterogenous           +----------+--------+--------+--------+-----------------------+--------+  CCA Distal 84       9                 smooth and heterogenous           +----------+--------+--------+--------+-----------------------+--------+  ICA Prox   48       15                smooth and heterogenous           +----------+--------+--------+--------+-----------------------+--------+  ICA Distal 81       26                                                  +----------+--------+--------+--------+-----------------------+--------+  ECA        112      8                                                   +----------+--------+--------+--------+-----------------------+--------+ +----------+--------+-------+--------+------------+             PSV cm/s EDV cms Describe Arm Pressure  +----------+--------+-------+--------+------------+  Subclavian 175                                     +----------+--------+-------+--------+------------+ +---------+--------+--+--------+--+---------+  Vertebral PSV cm/s 75 EDV cm/s 16 Antegrade   +---------+--------+--+--------+--+---------+ Left Carotid Findings: +----------+--------+--------+--------+-----------------------+--------+             PSV cm/s EDV cm/s Stenosis Describe  Comments  +----------+--------+--------+--------+-----------------------+--------+  CCA Prox   117                        smooth and heterogenous           +----------+--------+--------+--------+-----------------------+--------+  CCA Distal 83       10                smooth and heterogenous           +----------+--------+--------+--------+-----------------------+--------+  ICA Prox   60       14                smooth and heterogenous           +----------+--------+--------+--------+-----------------------+--------+  ICA Distal 70       21                                                  +----------+--------+--------+--------+-----------------------+--------+  ECA        133      9                                                   +----------+--------+--------+--------+-----------------------+--------+ +----------+--------+--------+--------+------------+  Subclavian PSV cm/s EDV cm/s Describe Arm Pressure  +----------+--------+--------+--------+------------+             188                        135           +----------+--------+--------+--------+------------+ +---------+--------+--+--------+--+---------+  Vertebral PSV cm/s 43 EDV cm/s 14 Antegrade  +---------+--------+--+--------+--+---------+  ABI Findings: +--------+------------------+-----+---------+--------------+  Right    Rt Pressure (mmHg) Index Waveform  Comment         +--------+------------------+-----+---------+--------------+  Brachial                                    Restricted arm  +--------+------------------+-----+---------+--------------+  PTA      120                0.89  triphasic                 +--------+------------------+-----+---------+--------------+  DP       117                0.87  biphasic                   +--------+------------------+-----+---------+--------------+ +--------+------------------+-----+---------+-------+  Left     Lt Pressure (mmHg) Index Waveform  Comment  +--------+------------------+-----+---------+-------+  Brachial 135                      triphasic          +--------+------------------+-----+---------+-------+  PTA      113                0.84  triphasic          +--------+------------------+-----+---------+-------+  DP       119                0.88  triphasic          +--------+------------------+-----+---------+-------+ +-------+---------------+----------------+  ABI/TBI Today's ABI/TBI Previous ABI/TBI  +-------+---------------+----------------+  Right   0.89                              +-------+---------------+----------------+  Left    0.88                              +-------+---------------+----------------+  Summary: Right Carotid: Velocities in the right ICA are consistent with a 1-39% stenosis. Left Carotid: Velocities in the left ICA are consistent with a 1-39% stenosis. Vertebrals: Bilateral vertebral arteries demonstrate antegrade flow.  *See table(s) above for measurements and observations. Right ABI: Resting right ankle-brachial index indicates mild right lower extremity arterial disease. Left ABI: Resting left ankle-brachial index indicates mild left lower extremity arterial disease.  Electronically signed by Orlie Pollen on 01/23/2022 at 6:06:04 PM.    Final    VAS [place] LOWER EXTREMITY VENOUS (DVT)  Result Date: 02/15/2022  Lower Venous DVT Study Patient Name:  Colin Walton  Date of Exam:   02/17/2022 Medical Rec #: [id number]        Accession #:    [id number] Date of Birth: 07/12/1971        Patient Gender: M Patient Age:   4 years Exam Location:  [facility] Procedure:      VAS [place] LOWER EXTREMITY VENOUS (DVT) Referring Phys: Quillian Quince BENSIMHON --------------------------------------------------------------------------------  Indications: Pre-VAD.  Risk Factors: None  identified. Comparison Study: No prior studies. Performing Technologist: Oliver Hum RVT  Examination Guidelines: A complete evaluation includes B-mode imaging, spectral Doppler, color Doppler, and power Doppler as needed of all accessible portions of each vessel. Bilateral testing is considered an integral part of a complete examination. Limited examinations for reoccurring indications may be performed as noted. The reflux portion of the exam is performed with the patient in reverse Trendelenburg.  +---------+---------------+---------+-----------+----------+--------------+  RIGHT     Compressibility Phasicity Spontaneity Properties Thrombus Aging  +---------+---------------+---------+-----------+----------+--------------+  CFV       Full            Yes       Yes                                    +---------+---------------+---------+-----------+----------+--------------+  SFJ       Full                                                             +---------+---------------+---------+-----------+----------+--------------+  FV Prox   Full                                                             +---------+---------------+---------+-----------+----------+--------------+  FV Mid    Full                                                             +---------+---------------+---------+-----------+----------+--------------+  FV Distal Full                                                             +---------+---------------+---------+-----------+----------+--------------+  PFV       Full                                                             +---------+---------------+---------+-----------+----------+--------------+  POP       Full            Yes       Yes                                    +---------+---------------+---------+-----------+----------+--------------+  PTV       Full                                                              +---------+---------------+---------+-----------+----------+--------------+  PERO      Full                                                             +---------+---------------+---------+-----------+----------+--------------+   +---------+---------------+---------+-----------+----------+--------------+  LEFT      Compressibility Phasicity Spontaneity Properties Thrombus Aging  +---------+---------------+---------+-----------+----------+--------------+  CFV       Full            Yes       Yes                                    +---------+---------------+---------+-----------+----------+--------------+  SFJ       Full                                                             +---------+---------------+---------+-----------+----------+--------------+  FV Prox   Full                                                             +---------+---------------+---------+-----------+----------+--------------+  FV Mid    Full                                                             +---------+---------------+---------+-----------+----------+--------------+  FV Distal Full                                                             +---------+---------------+---------+-----------+----------+--------------+  PFV       Full                                                             +---------+---------------+---------+-----------+----------+--------------+  POP       Full            Yes       Yes                                    +---------+---------------+---------+-----------+----------+--------------+  PTV       Full                                                             +---------+---------------+---------+-----------+----------+--------------+  PERO      Full                                                             +---------+---------------+---------+-----------+----------+--------------+     Summary: RIGHT: - There is no evidence of deep vein thrombosis in the lower extremity.  - No cystic structure found in  the popliteal fossa.  LEFT: - There is no evidence of deep vein thrombosis in the lower extremity.  - No cystic structure found in the popliteal fossa.  *See table(s) above for measurements and observations. Electronically signed by Gerarda Fraction on 02/19/2022 at 6:05:39 PM.    Final    CT CHEST ABDOMEN PELVIS WO CONTRAST  Result Date: 02/09/2022 CLINICAL DATA:  Vats evaluation EXAM: CT CHEST, ABDOMEN AND PELVIS WITHOUT CONTRAST TECHNIQUE: Multidetector CT imaging of the chest, abdomen and pelvis was performed following the standard protocol without IV contrast. RADIATION DOSE REDUCTION: This exam was performed according to the departmental dose-optimization program which includes automated exposure control, adjustment of the mA and/or kV according to patient size and/or use of iterative reconstruction technique. COMPARISON:  Chest CT December 07, 2015 FINDINGS: CT CHEST FINDINGS Cardiovascular: Right upper extremity PICC with tip near the superior cavoatrial junction. Aortic atherosclerosis without aneurysmal dilation. Four-chamber cardiac enlargement. No significant pericardial effusion/thickening. Mediastinum/Nodes: No supraclavicular adenopathy. No discrete thyroid nodularity. Prominent mediastinal lymph nodes which is decreased in size since December 07, 2015 for instance a precarinal lymph node on image 26/3 now measures 5 mm previously 11 mm. No pathologically enlarged mediastinal, hilar or axillary lymph nodes, noting limited sensitivity for the detection of hilar adenopathy on this noncontrast study. Esophagus is grossly unremarkable. Lungs/Pleura: Biapical pleuroparenchymal scarring with upper lobe predominant emphysematous  change. Solid subpleural 6 mm right upper lobe pulmonary nodule on image 76/3, previously measuring 3 mm on CT December 07, 2015. No pleural effusion or pneumothorax. Musculoskeletal: No chest wall mass or suspicious bone lesions identified. CT ABDOMEN PELVIS FINDINGS Hepatobiliary:  Nodular hepatic contour with enlargement of the caudate and lateral left lobe of the liver suggestive of cirrhosis. Gallbladder is nondistended. No biliary ductal dilation. Pancreas: No pancreatic ductal dilation or evidence of acute inflammation. Spleen: Normal in size without focal abnormality. Adrenals/Urinary Tract: Bilateral adrenal glands appear normal. No hydronephrosis. No renal, ureteral or bladder calculi identified. No contour deforming renal mass. Urinary bladder is unremarkable for degree of distension. Stomach/Bowel: Radiopaque enteric contrast material traverses the terminal ileum. Stomach is distended with ingested material without wall thickening. No pathologic dilation of small or large bowel. The appendix and terminal ileum appear normal. Pancolonic diverticulosis without findings of acute diverticulitis. No evidence of acute bowel inflammation. Vascular/Lymphatic: No pathologically enlarged abdominal or pelvic lymph nodes. Aortic atherosclerosis without aneurysmal dilation. Reproductive: Prostate is unremarkable. Other: No significant abdominopelvic free fluid. Musculoskeletal: No acute or significant osseous findings. IMPRESSION: 1. No acute findings within the chest, abdomen, or pelvis. 2. Pancolonic diverticulosis without findings of acute diverticulitis. 3. Nodular hepatic contour with enlargement of the caudate and lateral left lobe of the liver suggestive of cirrhosis. 4. Solid 6 mm subpleural right upper lobe pulmonary nodule, previously measuring 3 mm on CT December 07, 2015. Given the minimal enlargement over proximally 6 years is almost certainly reflects a benign/indolent process. Consider follow-up noncontrast chest CT in 12 months to assess stability. 5. Four-chamber cardiac enlargement. 6. Aortic Atherosclerosis (ICD10-I70.0) and Emphysema (ICD10-J43.9). Electronically Signed   By: Dahlia Bailiff M.D.   On: 01/25/2022 07:59      Recent Lab Findings: Lab Results  Component Value  Date   WBC 5.5 02/15/2022   HGB 16.9 02/15/2022   HCT 53.4 (H) 02/15/2022   PLT 230 02/15/2022   GLUCOSE 87 02/15/2022   CHOL 172 02/13/2022   TRIG 100 02/13/2022   HDL 56 02/13/2022   LDLCALC 96 02/13/2022   ALT 16 02/03/2022   AST 32 02/07/2022   NA 137 02/15/2022   K 4.6 02/15/2022   CL 105 02/15/2022   CREATININE 1.10 02/15/2022   BUN 22 (H) 02/15/2022   CO2 27 02/15/2022   TSH 1.796 02/17/2022   INR 1.0 02/13/2022   HGBA1C 6.2 (H) 02/13/2022      Assessment / Plan:      Patient appears to be an appropriate candidate for advanced mechanical support with VAD but at increased risk because of his poor pulmonary status from heavy smoking history and the need for additional procedures including CABG to the RCA and possible aortic valve closure.  Prior to surgery he will need his dental evaluation and possible extractions.

## 2022-02-15 NOTE — Progress Notes (Signed)
Pt placed on CPAP for bed. RT will cont to monitor.  ?

## 2022-02-15 NOTE — TOC CM/SW Note (Signed)
HF TOC CM spoke to pt and mother at bedside. Pt states he will forward information about his Short term disability. Will need MD to sign paperwork. Pt did forward but pt has to sign into his portal to retrieve documents. He is unable to login. Will call IT dept to assist him with logging into portal to retrieve short term disability paperwork. Isidoro Donning RN3 CCM, Heart Failure TOC CM [phone]

## 2022-02-16 DIAGNOSIS — I509 Heart failure, unspecified: Secondary | ICD-10-CM | POA: Diagnosis not present

## 2022-02-16 DIAGNOSIS — I351 Nonrheumatic aortic (valve) insufficiency: Secondary | ICD-10-CM | POA: Diagnosis not present

## 2022-02-16 DIAGNOSIS — I2510 Atherosclerotic heart disease of native coronary artery without angina pectoris: Secondary | ICD-10-CM | POA: Diagnosis not present

## 2022-02-16 DIAGNOSIS — I5023 Acute on chronic systolic (congestive) heart failure: Secondary | ICD-10-CM | POA: Diagnosis not present

## 2022-02-16 LAB — COMPREHENSIVE METABOLIC PANEL
ALT: 28 U/L (ref 0–44)
AST: 36 U/L (ref 15–41)
Albumin: 2.7 g/dL — ABNORMAL LOW (ref 3.5–5.0)
Alkaline Phosphatase: 97 U/L (ref 38–126)
Anion gap: 6 (ref 5–15)
BUN: 24 mg/dL — ABNORMAL HIGH (ref 6–20)
CO2: 29 mmol/L (ref 22–32)
Calcium: 8.7 mg/dL — ABNORMAL LOW (ref 8.9–10.3)
Chloride: 99 mmol/L (ref 98–111)
Creatinine, Ser: 1.21 mg/dL (ref 0.61–1.24)
GFR, Estimated: >60 mL/min (ref >60)
Glucose, Bld: 176 mg/dL — ABNORMAL HIGH (ref 70–99)
Potassium: 4.2 mmol/L (ref 3.5–5.1)
Sodium: 134 mmol/L — ABNORMAL LOW (ref 135–145)
Total Bilirubin: 1.2 mg/dL (ref 0.3–1.2)
Total Protein: 6.2 g/dL — ABNORMAL LOW (ref 6.5–8.1)

## 2022-02-16 LAB — CBC
HCT: 50.6 % (ref 39.0–52.0)
Hemoglobin: 16.3 g/dL (ref 13.0–17.0)
MCH: 29.8 pg (ref 26.0–34.0)
MCHC: 32.2 g/dL (ref 30.0–36.0)
MCV: 92.5 fL (ref 80.0–100.0)
Platelets: 206 10*3/uL (ref 150–400)
RBC: 5.47 MIL/uL (ref 4.22–5.81)
RDW: 13.9 % (ref 11.5–15.5)
WBC: 5 10*3/uL (ref 4.0–10.5)
nRBC: 0 % (ref 0.0–0.2)

## 2022-02-16 LAB — COOXEMETRY PANEL
Carboxyhemoglobin: 2 % — ABNORMAL HIGH (ref 0.5–1.5)
Methemoglobin: 0.8 % (ref 0.0–1.5)
O2 Saturation: 72.7 %
Total hemoglobin: 16.6 g/dL — ABNORMAL HIGH (ref 12.0–16.0)

## 2022-02-16 MED ORDER — BOOST PO LIQD
237.0000 mL | Freq: Three times a day (TID) | ORAL | Status: DC
  Administered 2022-02-16 – 2022-02-21 (×14): 237 mL via ORAL
  Filled 2022-02-16 (×21): qty 237

## 2022-02-16 NOTE — Progress Notes (Signed)
2 Days Post-Op Procedure(s) (LRB): RIGHT/LEFT HEART CATH AND CORONARY ANGIOGRAPHY (N/A) Subjective:  Sitting in chair on O2 [place]  He understands that the VAD implant will also require Aortic valve surgery to treat AI and CABG x1 to RCA for 80% RCA stenosis with sig RV dysfunction Objective: Vital signs in last 24 hours: Temp:  [98.1 F (36.7 C)-98.2 F (36.8 C)] 98.2 F (36.8 C) (02/25 0719) Pulse Rate:  [63-109] 94 (02/25 0900) Cardiac Rhythm: Normal sinus rhythm (02/25 0800) Resp:  [15-33] 16 (02/25 0900) BP: (116-167)/(51-91) 137/68 (02/25 0900) SpO2:  [85 %-96 %] 85 % (02/25 0900) Weight:  [104 kg] 104 kg (02/25 0500)  Hemodynamic parameters for last 24 hours: CVP:  [6 mmHg-7 mmHg] 6 mmHg  Intake/Output from previous day: 02/24 0701 - 02/25 0700 In: 310.9 [I.V.:310.9] Out: 4250 [Urine:4250] Intake/Output this shift: Total I/O In: 76.9 [I.V.:76.9] Out: 1350 [Urine:1350]  Scattered rhonchi No murmur Lab Results: Recent Labs    02/15/22 0434 02/16/22 0510  WBC 5.5 5.0  HGB 16.9 16.3  HCT 53.4* 50.6  PLT 230 206   BMET:  Recent Labs    02/15/22 0434 02/16/22 0510  NA 137 134*  K 4.6 4.2  CL 105 99  CO2 27 29  GLUCOSE 87 176*  BUN 22* 24*  CREATININE 1.10 1.21  CALCIUM 8.3* 8.7*    PT/INR: No results for input(s): LABPROT, INR in the last 72 hours. ABG    Component Value Date/Time   PHART 7.342 (L) 02/13/2022 0754   HCO3 23.5 02/08/2022 0758   HCO3 29.1 (H) 01/30/2022 0758   TCO2 25 01/31/2022 0758   TCO2 31 02/02/2022 0758   ACIDBASEDEF 3.0 (H) 02/03/2022 0758   O2SAT 72.7 02/16/2022 0510   CBG (last 3)  No results for input(s): GLUCAP in the last 72 hours.  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 Will need about 8 dental extractions preop which will need a few  days to heal before cardiac surgery  LOS: 8 days    Lovett Sox 02/16/2022

## 2022-02-16 NOTE — Progress Notes (Signed)
Patient ID: Colin Walton, male   DOB: Sep 04, 1971, 51 y.o.   MRN: [id number]     Advanced Heart Failure Rounding Note  PCP-Cardiologist: Lauree Chandler, MD   Subjective:   LVAD work up initiated.  2/23 - RHC/LHC on milrinone 0.125 mcg + Norepi 2 mcg.  -  Prox RCA to Mid RCA lesion is 80% stenosed.   1st Mrg lesion is 90% stenosed.   Prox LAD to Mid LAD lesion is 40% stenosed.   1st Diag lesion is 90% stenosed.   Ao = 107/60 (81) LV = 112/21 RA = 12 RV = 64/20 PA = 68/25 (44) PCW = 25  Fick cardiac output/index = 4.6/2.2 PVR = 4.1 WU Ao sat = 94% PA sat =68%, 67% PAPi = 3.6The left ventricular ejection fraction is less than 25% by visual estimate.  Remains on milrinone 0.25 mcg + norep 2 mcg. IV lasix restarted yesterday with good diuresis. CO-OX 73% CVP 6  Denies CP or SOB.   Dental surgery scheduled for Tuesday.   Objective:   Weight Range: 104 kg Body mass index is 35.91 kg/m.   Vital Signs:   Temp:  [97.7 F (36.5 C)-98.2 F (36.8 C)] 97.7 F (36.5 C) (02/25 1111) Pulse Rate:  [63-108] 94 (02/25 1200) Resp:  [15-33] 18 (02/25 1200) BP: (116-176)/(51-82) 144/69 (02/25 1200) SpO2:  [85 %-96 %] 93 % (02/25 1200) Weight:  [104 kg] 104 kg (02/25 0500) Last BM Date : 02/13/22  Weight change: Filed Weights   01/25/2022 0630 02/15/22 0636 02/16/22 0500  Weight: 103.7 kg 104.2 kg 104 kg    Intake/Output:   Intake/Output Summary (Last 24 hours) at 02/16/2022 1406 Last data filed at 02/16/2022 1200 Gross per 24 hour  Intake 339.09 ml  Output 4350 ml  Net -4010.91 ml       Physical Exam   General:  Sitting up in bed No resp difficulty HEENT: normal Neck: supple. no JVD. Carotids 2+ bilat; no bruits. No lymphadenopathy or thryomegaly appreciated. Cor: PMI laterally displaced. Regular rate & rhythm. 2/6 AI/AS Lungs: clear Abdomen: obese soft, nontender, nondistended. No hepatosplenomegaly. No bruits or masses. Good bowel sounds. Extremities: no  cyanosis, clubbing, rash, edema Neuro: alert & orientedx3, cranial nerves grossly intact. moves all 4 extremities w/o difficulty. Affect pleasant   Telemetry   SR 80-90s Personally reviewed   Labs    CBC Recent Labs    02/15/22 0434 02/16/22 0510  WBC 5.5 5.0  HGB 16.9 16.3  HCT 53.4* 50.6  MCV 93.2 92.5  PLT 230 [id number]    Basic Metabolic Panel Recent Labs    01/24/2022 0450 02/11/2022 0754 02/15/22 0434 02/16/22 0510  NA 132*   < > 137 134*  K 4.9   < > 4.6 4.2  CL 101  --  105 99  CO2 22  --  27 29  GLUCOSE 97  --  87 176*  BUN 27*  --  22* 24*  CREATININE 1.14  --  1.10 1.21  CALCIUM 8.5*  --  8.3* 8.7*  MG 2.4  --   --   --    < > = values in this interval not displayed.    Liver Function Tests Recent Labs    02/16/22 0510  AST 36  ALT 28  ALKPHOS 97  BILITOT 1.2  PROT 6.2*  ALBUMIN 2.7*    No results for input(s): LIPASE, AMYLASE in the last 72 hours. Cardiac Enzymes No results for input(s): CKTOTAL, CKMB, CKMBINDEX,  TROPONINI in the last 72 hours.  BNP: BNP (last 3 results) Recent Labs    03/22/21 1106 01/25/2022 1649 02/11/22 0345  BNP 995.8* 1,303.2* 166.8*     ProBNP (last 3 results) No results for input(s): PROBNP in the last 8760 hours.   D-Dimer No results for input(s): DDIMER in the last 72 hours. Hemoglobin A1C No results for input(s): HGBA1C in the last 72 hours.  Fasting Lipid Panel No results for input(s): CHOL, HDL, LDLCALC, TRIG, CHOLHDL, LDLDIRECT in the last 72 hours.  Thyroid Function Tests No results for input(s): TSH, T4TOTAL, T3FREE, THYROIDAB in the last 72 hours.  Invalid input(s): FREET3   Other results:   Imaging    No results found.   Medications:     Scheduled Medications:  (feeding supplement) PROSource Plus  30 mL Oral BID BM   allopurinol  300 mg Oral Daily   aspirin EC  81 mg Oral Daily   atorvastatin  80 mg Oral Daily   chlorhexidine  15 mL Mouth Rinse BID   Chlorhexidine Gluconate  Cloth  6 each Topical Daily   dapagliflozin propanediol  10 mg Oral Daily   digoxin  0.125 mg Oral Daily   enoxaparin (LOVENOX) injection  50 mg Subcutaneous Q24H   furosemide  40 mg Intravenous BID   lactose free nutrition  237 mL Oral TID WC   melatonin  3 mg Oral QHS   multivitamin with minerals  1 tablet Oral Daily   sodium chloride flush  10-40 mL Intracatheter Q12H   sodium chloride flush  3 mL Intravenous Q12H    Infusions:  sodium chloride     milrinone 0.25 mcg/kg/min (02/16/22 1200)   norepinephrine (LEVOPHED) Adult infusion 2 mcg/min (02/16/22 1200)    PRN Medications: sodium chloride, acetaminophen, guaiFENesin-dextromethorphan, ondansetron (ZOFRAN) IV, sodium chloride flush, sodium chloride flush   Assessment/Plan   1. Acute on Chronic Biventricular Heart Failure  - due to NICM - suspect due to HTN and OSA +/- ETOH - cath 03/2013 no CAD - Echo 11/20 EF 30-35% - Echo 4/21 EF 20-25% moderate RV dysfunction (worse since previous) - Personally reviewed - TEE on 04/18/20. Procedure c/b by severe respiratory distress and laryngospasm. LVEF 25% RV moderately HK. Aortic valve not interrogated completely due to respiratory distress. AI appeared to be moderate.  - cMRI 8/21 EF 24% Moderate AI. No LGE or infiltrative process. - Echo 03/22/21 EF 25-30% mod AI Moderate RV dysfunction.  - Echo 02/16/2022 EF < 20% with mild LV dilation, RV severely HK with moderate dilation and D-shaped septum, AI moderate to severe.  - Suspect CM related to HTN +/- ETOH. AI may be contributing but severity of biventricular dysfunction out of proportion to AI and LV not overly dilated. - NYHA IV with marked volume overload.  - [facility] 02/15/2022 3v CAD/ RHC with elevated filling pressures.  - Co-ox 73%  on milrinone 0.25 + NE 2 - Volume status improving. Continue IV lasix one more day  - Continue digoxin 0.125 mg daily - Continue Farxiga 10 mg dailiy - RV moderate to severe HK on echo but PAPI ok  -  Ideally will need transplant but is still smoking and using THC. UDS negative. Will plan VAD this admit + AVR +/- graft to RCA - Tentatively scheduled for VAD on Wednesday, however teeth extraction now schedule for Tuesday and not ideal to do on back-to-back days. D/w Dr. Prescott Gum. I have left a message to see if we can move  dental surgery up to Monday but have not heard back yet. If this is not possible will need to consider bumping VAD implant back a day or two as schedule permits.    2. Moderate-severe aortic regurgitation - Visually mild to moderate on TEE 4/21 but P1/2 time 215 msec suggesting severe AI.  - TEE on 04/18/20. Procedure c/b by severe respiratory distress and laryngospasm. LVEF 25% RV moderately HK. Aortic valve not interrogated completely due to respiratory distress. AI appeared to be moderate - TEE reviewed with echo team felt to be moderate at worst. - Mild AI on echo 3/22. - Moderate to severe AI on echo 01/31/2022.  Suspect AI is not cause of cardiomyopathy but is likely making symptoms worse.  - Continue diuresis - Plan AVR at time ov VAD  3. CAD - cath 3v CAD LAD 40% D1 90%, OM 90% mRCA 80% - plan graft to RCA at time of VAD for RV support - No chest pain.  - Continue ASA and atorvastatin 80 mg daily.    4. OSA on CPAP - Fully compliant w/ CPAP.  - Follows with Dr. Radford Pax.   5. Obesity. -Body mass index is 35.91 kg/m.   6. Tobacco use/ETOH - Says he stopped drinking - Continues to smoke, encouraged to quit.    7. ? Fatty Liver - Reports recent diagnosis at [facility], records not available.  8. AKI  - Scr bump from 1.3>>1.9. improved to 1.1 today  9. Polyarticular Gout - Completed prednisone burst.  - Uric acid only 6 but notes improvement in pain with steroids  10. Nonsustained VT - No VT - Keep K> 4.0 Mg > 2.0 - May need amio  11. PSVT - follow  12. Malnutrition - Prealbumin 9.5 -Dietitian appreciated.   CRITICAL CARE Performed by:  [name]  Total critical care time: 35 minutes  Critical care time was exclusive of separately billable procedures and treating other patients.  Critical care was necessary to treat or prevent imminent or life-threatening deterioration.  Critical care was time spent personally by me (independent of midlevel providers or residents) on the following activities: development of treatment plan with patient and/or surrogate as well as nursing, discussions with consultants, evaluation of patient's response to treatment, examination of patient, obtaining history from patient or surrogate, ordering and performing treatments and interventions, ordering and review of laboratory studies, ordering and review of radiographic studies, pulse oximetry and re-evaluation of patient's condition.   Length of Stay: [place], MD  02/16/2022, 2:06 PM  Advanced Heart Failure Team Pager [phone] (M-F; 7a - 5p)  Please contact [facility] for night-coverage after hours (5p -7a ) and weekends on [email or web address]

## 2022-02-17 DIAGNOSIS — I5023 Acute on chronic systolic (congestive) heart failure: Secondary | ICD-10-CM | POA: Diagnosis not present

## 2022-02-17 LAB — CBC
HCT: 50.8 % (ref 39.0–52.0)
Hemoglobin: 16.5 g/dL (ref 13.0–17.0)
MCH: 29.9 pg (ref 26.0–34.0)
MCHC: 32.5 g/dL (ref 30.0–36.0)
MCV: 92.2 fL (ref 80.0–100.0)
Platelets: 228 10*3/uL (ref 150–400)
RBC: 5.51 MIL/uL (ref 4.22–5.81)
RDW: 14 % (ref 11.5–15.5)
WBC: 5.7 10*3/uL (ref 4.0–10.5)
nRBC: 0 % (ref 0.0–0.2)

## 2022-02-17 LAB — COOXEMETRY PANEL
Carboxyhemoglobin: 2.1 % — ABNORMAL HIGH (ref 0.5–1.5)
Methemoglobin: <0.7 % (ref 0.0–1.5)
O2 Saturation: 71.8 %
Total hemoglobin: 17 g/dL — ABNORMAL HIGH (ref 12.0–16.0)

## 2022-02-17 LAB — BASIC METABOLIC PANEL
Anion gap: 7 (ref 5–15)
BUN: 21 mg/dL — ABNORMAL HIGH (ref 6–20)
CO2: 29 mmol/L (ref 22–32)
Calcium: 9 mg/dL (ref 8.9–10.3)
Chloride: 96 mmol/L — ABNORMAL LOW (ref 98–111)
Creatinine, Ser: 1.12 mg/dL (ref 0.61–1.24)
GFR, Estimated: >60 mL/min (ref >60)
Glucose, Bld: 184 mg/dL — ABNORMAL HIGH (ref 70–99)
Potassium: 4.5 mmol/L (ref 3.5–5.1)
Sodium: 132 mmol/L — ABNORMAL LOW (ref 135–145)

## 2022-02-17 MED ORDER — PREDNISONE 20 MG PO TABS
40.0000 mg | ORAL_TABLET | Freq: Every day | ORAL | Status: AC
  Administered 2022-02-17 – 2022-02-19 (×3): 40 mg via ORAL
  Filled 2022-02-17 (×3): qty 2

## 2022-02-17 MED ORDER — TORSEMIDE 20 MG PO TABS
60.0000 mg | ORAL_TABLET | Freq: Two times a day (BID) | ORAL | Status: DC
  Administered 2022-02-17 – 2022-02-18 (×2): 60 mg via ORAL
  Filled 2022-02-17 (×2): qty 3

## 2022-02-17 MED ORDER — PREDNISONE 20 MG PO TABS: 40.0000 mg | ORAL_TABLET | Freq: Every day | ORAL | Status: DC

## 2022-02-17 NOTE — Progress Notes (Signed)
Patient ID: Colin Walton, male   DOB: 1971-06-19, 51 y.o.   MRN: [id number]     Advanced Heart Failure Rounding Note  PCP-Cardiologist: Lauree Chandler, MD   Subjective:   LVAD work up initiated.  2/23 - RHC/LHC on milrinone 0.125 mcg + Norepi 2 mcg.  -  Prox RCA to Mid RCA lesion is 80% stenosed.   1st Mrg lesion is 90% stenosed.   Prox LAD to Mid LAD lesion is 40% stenosed.   1st Diag lesion is 90% stenosed.   Ao = 107/60 (81) LV = 112/21 RA = 12 RV = 64/20 PA = 68/25 (44) PCW = 25  Fick cardiac output/index = 4.6/2.2 PVR = 4.1 WU Ao sat = 94% PA sat =68%, 67% PAPi = 3.6The left ventricular ejection fraction is less than 25% by visual estimate.  Remains on milrinone 0.25 mcg + norep 2 mcg. Co-ox 72% Back on IV lasix. 3.5L out. Weight down 1 pound. Scr stable  Denies CP, SOB, orthopnea or PND.   Dental surgery scheduled for Tuesday.   Objective:   Weight Range: 103.5 kg Body mass index is 35.74 kg/m.   Vital Signs:   Temp:  [98 F (36.7 C)-99.4 F (37.4 C)] 99.4 F (37.4 C) (02/26 0808) Pulse Rate:  [81-109] 109 (02/26 1000) Resp:  [13-28] 27 (02/26 1000) BP: (94-161)/(53-87) 144/73 (02/26 1000) SpO2:  [82 %-98 %] 93 % (02/26 1000) Weight:  [103.5 kg] 103.5 kg (02/26 0808) Last BM Date : 02/13/22  Weight change: Filed Weights   02/16/22 0500 02/17/22 0500 02/17/22 0808  Weight: 104 kg 103.5 kg 103.5 kg    Intake/Output:   Intake/Output Summary (Last 24 hours) at 02/17/2022 1139 Last data filed at 02/17/2022 0800 Gross per 24 hour  Intake 564.08 ml  Output 2550 ml  Net -1985.92 ml     Physical Exam   General:  Well appearing. No resp difficulty HEENT: normal Neck: supple. JVP 9-10 Carotids 2+ bilat; no bruits. No lymphadenopathy or thryomegaly appreciated. Cor: PMI nondisplaced. Regular rate & rhythm. 2/6 AS/AI Lungs: clear Abdomen: obese soft, nontender, nondistended. No hepatosplenomegaly. No bruits or masses. Good bowel  sounds. Extremities: no cyanosis, clubbing, rash, trace edema Neuro: alert & orientedx3, cranial nerves grossly intact. moves all 4 extremities w/o difficulty. Affect pleasant  Telemetry   SR 90-105 Personally reviewed   Labs    CBC Recent Labs    02/16/22 0510 02/17/22 0508  WBC 5.0 5.7  HGB 16.3 16.5  HCT 50.6 50.8  MCV 92.5 92.2  PLT 206 [id number]    Basic Metabolic Panel Recent Labs    02/16/22 0510 02/17/22 0508  NA 134* 132*  K 4.2 4.5  CL 99 96*  CO2 29 29  GLUCOSE 176* 184*  BUN 24* 21*  CREATININE 1.21 1.12  CALCIUM 8.7* 9.0    Liver Function Tests Recent Labs    02/16/22 0510  AST 36  ALT 28  ALKPHOS 97  BILITOT 1.2  PROT 6.2*  ALBUMIN 2.7*     No results for input(s): LIPASE, AMYLASE in the last 72 hours. Cardiac Enzymes No results for input(s): CKTOTAL, CKMB, CKMBINDEX, TROPONINI in the last 72 hours.  BNP: BNP (last 3 results) Recent Labs    03/22/21 1106 01/26/2022 1649 02/11/22 0345  BNP 995.8* 1,303.2* 166.8*     ProBNP (last 3 results) No results for input(s): PROBNP in the last 8760 hours.   D-Dimer No results for input(s): DDIMER in the last 72 hours. Hemoglobin  A1C No results for input(s): HGBA1C in the last 72 hours.  Fasting Lipid Panel No results for input(s): CHOL, HDL, LDLCALC, TRIG, CHOLHDL, LDLDIRECT in the last 72 hours.  Thyroid Function Tests No results for input(s): TSH, T4TOTAL, T3FREE, THYROIDAB in the last 72 hours.  Invalid input(s): FREET3   Other results:   Imaging    No results found.   Medications:     Scheduled Medications:  (feeding supplement) PROSource Plus  30 mL Oral BID BM   allopurinol  300 mg Oral Daily   aspirin EC  81 mg Oral Daily   atorvastatin  80 mg Oral Daily   chlorhexidine  15 mL Mouth Rinse BID   Chlorhexidine Gluconate Cloth  6 each Topical Daily   dapagliflozin propanediol  10 mg Oral Daily   digoxin  0.125 mg Oral Daily   enoxaparin (LOVENOX) injection  50 mg  Subcutaneous Q24H   furosemide  40 mg Intravenous BID   lactose free nutrition  237 mL Oral TID WC   melatonin  3 mg Oral QHS   multivitamin with minerals  1 tablet Oral Daily   predniSONE  40 mg Oral Q breakfast   sodium chloride flush  10-40 mL Intracatheter Q12H   sodium chloride flush  3 mL Intravenous Q12H    Infusions:  sodium chloride     milrinone 0.25 mcg/kg/min (02/17/22 0800)   norepinephrine (LEVOPHED) Adult infusion 2 mcg/min (02/17/22 0800)    PRN Medications: sodium chloride, acetaminophen, guaiFENesin-dextromethorphan, ondansetron (ZOFRAN) IV, sodium chloride flush, sodium chloride flush   Assessment/Plan   1. Acute on Chronic Biventricular Heart Failure  - due to NICM - suspect due to HTN and OSA +/- ETOH - cath 03/2013 no CAD - Echo 11/20 EF 30-35% - Echo 4/21 EF 20-25% moderate RV dysfunction (worse since previous) - Personally reviewed - TEE on 04/18/20. Procedure c/b by severe respiratory distress and laryngospasm. LVEF 25% RV moderately HK. Aortic valve not interrogated completely due to respiratory distress. AI appeared to be moderate.  - cMRI 8/21 EF 24% Moderate AI. No LGE or infiltrative process. - Echo 03/22/21 EF 25-30% mod AI Moderate RV dysfunction.  - Echo 02/15/2022 EF < 20% with mild LV dilation, RV severely HK with moderate dilation and D-shaped septum, AI moderate to severe.  - Suspect CM related to HTN +/- ETOH. AI may be contributing but severity of biventricular dysfunction out of proportion to AI and LV not overly dilated. - NYHA IV with marked volume overload.  - [facility] 02/16/2022 3v CAD/ RHC with elevated filling pressures.  - On milrinone 0.25 + NE 2. Co-ox 72% - Volume status improving. Can switch back to oral diuretic - Continue digoxin 0.125 mg daily - Continue Farxiga 10 mg dailiy - RV moderate to severe HK on echo but PAPI ok  - Ideally will need transplant but is still smoking and using THC. UDS negative. Will plan VAD this admit + AVR +/-  graft to RCA - Tentatively scheduled for VAD on Wednesday, however teeth extraction now schedule for Tuesday and not ideal to do on back-to-back days. D/w Dr. Prescott Gum. I have left a message to see if we can move dental surgery up to tomorrow but have not heard back yet. If this is not possible will need to consider bumping VAD implant back a day or two as schedule permits.    2. Moderate-severe aortic regurgitation - Visually mild to moderate on TEE 4/21 but P1/2 time 215 msec suggesting severe  AI.  - TEE on 04/18/20. Procedure c/b by severe respiratory distress and laryngospasm. LVEF 25% RV moderately HK. Aortic valve not interrogated completely due to respiratory distress. AI appeared to be moderate - TEE reviewed with echo team felt to be moderate at worst. - Mild AI on echo 3/22. - Moderate to severe AI on echo 02/09/2022.  Suspect AI is not cause of cardiomyopathy but is likely making symptoms worse.  - Plan AVR at time of VAD  3. CAD - cath 3v CAD LAD 40% D1 90%, OM 90% mRCA 80% - plan graft to RCA at time of VAD for RV support - No s/s angina.  - Continue ASA and atorvastatin 80 mg daily.    4. OSA on CPAP - Fully compliant w/ CPAP.  - Follows with Dr. Radford Pax.   5. Obesity. -Body mass index is 35.74 kg/m.   6. Tobacco use/ETOH - Says he stopped drinking - Continues to smoke, encouraged to quit.    7. ? Fatty Liver - Reports recent diagnosis at [facility], records not available.  8. AKI  - Scr bump from 1.3>>1.9. Stable at 1.1 today  9. Polyarticular Gout - Completed prednisone burst.  - Uric acid only 6 but notes improvement in pain with steroids  10. Nonsustained VT - Stable - Keep K> 4.0 Mg > 2.0  11. PSVT - follow  12. Malnutrition - Prealbumin 9.5 -Dietitian appreciated.   CRITICAL CARE Performed by: [name]  Total critical care time: 35 minutes  Critical care time was exclusive of separately billable procedures and treating other  patients.  Critical care was necessary to treat or prevent imminent or life-threatening deterioration.  Critical care was time spent personally by me (independent of midlevel providers or residents) on the following activities: development of treatment plan with patient and/or surrogate as well as nursing, discussions with consultants, evaluation of patient's response to treatment, examination of patient, obtaining history from patient or surrogate, ordering and performing treatments and interventions, ordering and review of laboratory studies, ordering and review of radiographic studies, pulse oximetry and re-evaluation of patient's condition.   Length of Stay: [place], MD  02/17/2022, 11:39 AM  Advanced Heart Failure Team Pager [phone] (M-F; 7a - 5p)  Please contact [facility] for night-coverage after hours (5p -7a ) and weekends on [email or web address]

## 2022-02-18 DIAGNOSIS — I5023 Acute on chronic systolic (congestive) heart failure: Secondary | ICD-10-CM | POA: Diagnosis not present

## 2022-02-18 LAB — BASIC METABOLIC PANEL
Anion gap: 7 (ref 5–15)
BUN: 33 mg/dL — ABNORMAL HIGH (ref 6–20)
CO2: 34 mmol/L — ABNORMAL HIGH (ref 22–32)
Calcium: 9.2 mg/dL (ref 8.9–10.3)
Chloride: 94 mmol/L — ABNORMAL LOW (ref 98–111)
Creatinine, Ser: 1.35 mg/dL — ABNORMAL HIGH (ref 0.61–1.24)
GFR, Estimated: >60 mL/min (ref >60)
Glucose, Bld: 217 mg/dL — ABNORMAL HIGH (ref 70–99)
Potassium: 4.6 mmol/L (ref 3.5–5.1)
Sodium: 135 mmol/L (ref 135–145)

## 2022-02-18 LAB — COOXEMETRY PANEL
Carboxyhemoglobin: 1.6 % — ABNORMAL HIGH (ref 0.5–1.5)
Methemoglobin: 1.5 % (ref 0.0–1.5)
O2 Saturation: 71.7 %
Total hemoglobin: 17.6 g/dL — ABNORMAL HIGH (ref 12.0–16.0)

## 2022-02-18 LAB — CBC
HCT: 51.6 % (ref 39.0–52.0)
Hemoglobin: 17.2 g/dL — ABNORMAL HIGH (ref 13.0–17.0)
MCH: 30.1 pg (ref 26.0–34.0)
MCHC: 33.3 g/dL (ref 30.0–36.0)
MCV: 90.4 fL (ref 80.0–100.0)
Platelets: 237 10*3/uL (ref 150–400)
RBC: 5.71 MIL/uL (ref 4.22–5.81)
RDW: 13.9 % (ref 11.5–15.5)
WBC: 7.9 10*3/uL (ref 4.0–10.5)
nRBC: 0 % (ref 0.0–0.2)

## 2022-02-18 LAB — FACTOR 5 LEIDEN

## 2022-02-18 MED ORDER — CEFAZOLIN SODIUM-DEXTROSE 2-4 GM/100ML-% IV SOLN
2.0000 g | INTRAVENOUS | Status: DC
  Filled 2022-02-18: qty 100

## 2022-02-18 MED ORDER — TORSEMIDE 20 MG PO TABS: 60.0000 mg | ORAL_TABLET | Freq: Every day | ORAL | Status: DC

## 2022-02-18 NOTE — Progress Notes (Addendum)
Patient ID: Colin Walton, male   DOB: 19-Jul-1971, 51 y.o.   MRN: [id number]     Advanced Heart Failure Rounding Note  PCP-Cardiologist: Verne Carrow, MD   Subjective:   LVAD work up initiated.  2/23 - RHC/LHC on milrinone 0.125 mcg + Norepi 2 mcg.  -  Prox RCA to Mid RCA lesion is 80% stenosed.   1st Mrg lesion is 90% stenosed.   Prox LAD to Mid LAD lesion is 40% stenosed.   1st Diag lesion is 90% stenosed.   Ao = 107/60 (81) LV = 112/21 RA = 12 RV = 64/20 PA = 68/25 (44) PCW = 25  Fick cardiac output/index = 4.6/2.2 PVR = 4.1 WU Ao sat = 94% PA sat =68%, 67% PAPi = 3.6The left ventricular ejection fraction is less than 25% by visual estimate.  Remains on milrinone 0.25 mcg + norep 2 mcg. Co-ox 72%. BP running high  CVP 4.   Creatinine trending up 1.1>1.35  Denies SOB. No complaints.  Objective:   Weight Range: 102.9 kg Body mass index is 35.52 kg/m.   Vital Signs:   Temp:  [98 F (36.7 C)-99 F (37.2 C)] 98 F (36.7 C) (02/27 0400) Pulse Rate:  [65-109] 94 (02/27 0500) Resp:  [14-28] 16 (02/27 0500) BP: (117-162)/(53-81) 123/53 (02/27 0500) SpO2:  [90 %-99 %] 92 % (02/27 0500) Weight:  [102.9 kg] 102.9 kg (02/27 0500) Last BM Date : 02/13/22  Weight change: Filed Weights   02/17/22 0500 02/17/22 0808 02/18/22 0500  Weight: 103.5 kg 103.5 kg 102.9 kg    Intake/Output:   Intake/Output Summary (Last 24 hours) at 02/18/2022 0809 Last data filed at 02/18/2022 0500 Gross per 24 hour  Intake 803.23 ml  Output 4700 ml  Net -3896.77 ml    Physical Exam  CVP 4 General:  Sitting on the side of the bed.  No resp difficulty HEENT: normal Neck: supple. no JVD. Carotids 2+ bilat; no bruits. No lymphadenopathy or thryomegaly appreciated. Cor: PMI nondisplaced. Regular rate & rhythm. No rubs, gallops. 2/6 AS/AI. Lungs: clear Abdomen: soft, nontender, nondistended. No hepatosplenomegaly. No bruits or masses. Good bowel sounds. Extremities: no cyanosis,  clubbing, rash, edema RUE PICC  Neuro: alert & orientedx3, cranial nerves grossly intact. moves all 4 extremities w/o difficulty. Affect pleasant  Telemetry   SR -St 90-100s    Labs    CBC Recent Labs    02/17/22 0508 02/18/22 0510  WBC 5.7 7.9  HGB 16.5 17.2*  HCT 50.8 51.6  MCV 92.2 90.4  PLT 228 237   Basic Metabolic Panel Recent Labs    92/11/94 0508 02/18/22 0510  NA 132* 135  K 4.5 4.6  CL 96* 94*  CO2 29 34*  GLUCOSE 184* 217*  BUN 21* 33*  CREATININE 1.12 1.35*  CALCIUM 9.0 9.2   Liver Function Tests Recent Labs    02/16/22 0510  AST 36  ALT 28  ALKPHOS 97  BILITOT 1.2  PROT 6.2*  ALBUMIN 2.7*    No results for input(s): LIPASE, AMYLASE in the last 72 hours. Cardiac Enzymes No results for input(s): CKTOTAL, CKMB, CKMBINDEX, TROPONINI in the last 72 hours.  BNP: BNP (last 3 results) Recent Labs    03/22/21 1106 01/25/2022 1649 02/11/22 0345  BNP 995.8* 1,303.2* 166.8*    ProBNP (last 3 results) No results for input(s): PROBNP in the last 8760 hours.   D-Dimer No results for input(s): DDIMER in the last 72 hours. Hemoglobin A1C No results for  input(s): HGBA1C in the last 72 hours.  Fasting Lipid Panel No results for input(s): CHOL, HDL, LDLCALC, TRIG, CHOLHDL, LDLDIRECT in the last 72 hours.  Thyroid Function Tests No results for input(s): TSH, T4TOTAL, T3FREE, THYROIDAB in the last 72 hours.  Invalid input(s): FREET3   Other results:   Imaging    No results found.   Medications:     Scheduled Medications:  (feeding supplement) PROSource Plus  30 mL Oral BID BM   allopurinol  300 mg Oral Daily   aspirin EC  81 mg Oral Daily   atorvastatin  80 mg Oral Daily   chlorhexidine  15 mL Mouth Rinse BID   Chlorhexidine Gluconate Cloth  6 each Topical Daily   dapagliflozin propanediol  10 mg Oral Daily   digoxin  0.125 mg Oral Daily   lactose free nutrition  237 mL Oral TID WC   melatonin  3 mg Oral QHS   multivitamin  with minerals  1 tablet Oral Daily   predniSONE  40 mg Oral Q breakfast   sodium chloride flush  10-40 mL Intracatheter Q12H   sodium chloride flush  3 mL Intravenous Q12H   [START ON 02/16/2022] torsemide  60 mg Oral Daily    Infusions:  sodium chloride     milrinone 0.25 mcg/kg/min (02/18/22 0500)   norepinephrine (LEVOPHED) Adult infusion 2 mcg/min (02/18/22 0500)    PRN Medications: sodium chloride, acetaminophen, guaiFENesin-dextromethorphan, ondansetron (ZOFRAN) IV, sodium chloride flush, sodium chloride flush   Assessment/Plan   1. Acute on Chronic Biventricular Heart Failure  - due to NICM - suspect due to HTN and OSA +/- ETOH - cath 03/2013 no CAD - Echo 11/20 EF 30-35% - Echo 4/21 EF 20-25% moderate RV dysfunction (worse since previous) - Personally reviewed - TEE on 04/18/20. Procedure c/b by severe respiratory distress and laryngospasm. LVEF 25% RV moderately HK. Aortic valve not interrogated completely due to respiratory distress. AI appeared to be moderate.  - cMRI 8/21 EF 24% Moderate AI. No LGE or infiltrative process. - Echo 03/22/21 EF 25-30% mod AI Moderate RV dysfunction.  - Echo 02/12/2022 EF < 20% with mild LV dilation, RV severely HK with moderate dilation and D-shaped septum, AI moderate to severe.  - Suspect CM related to HTN +/- ETOH. AI may be contributing but severity of biventricular dysfunction out of proportion to AI and LV not overly dilated. - NYHA IV with marked volume overload.  - [facility] 02/05/2022 3v CAD/ RHC with elevated filling pressures.  - On milrinone 0.25 + NE 2. Co-ox 72%. BP running high cut back norepi to 1 mcg.  - CVP 4. Cut back torsemide to 60 mg daily. - Continue digoxin 0.125 mg daily - Continue Farxiga 10 mg dailiy - RV moderate to severe HK on echo but PAPI ok  - Ideally will need transplant but is still smoking and using THC. UDS negative. Will plan VAD this admit + AVR +/- graft to RCA - Tentatively scheduled for VAD on Wednesday,  however teeth extraction now schedule for Tuesday and not ideal to do on back-to-back days. D/w Dr. Prescott Gum.    2. Moderate-severe aortic regurgitation - Visually mild to moderate on TEE 4/21 but P1/2 time 215 msec suggesting severe AI.  - TEE on 04/18/20. Procedure c/b by severe respiratory distress and laryngospasm. LVEF 25% RV moderately HK. Aortic valve not interrogated completely due to respiratory distress. AI appeared to be moderate - TEE reviewed with echo team felt to be moderate  at worst. - Mild AI on echo 3/22. - Moderate to severe AI on echo 01/29/2022.  Suspect AI is not cause of cardiomyopathy but is likely making symptoms worse.  - Plan AVR at time of VAD  3. CAD - cath 3v CAD LAD 40% D1 90%, OM 90% mRCA 80% - plan graft to RCA at time of VAD for RV support - No chest pain.  - Continue ASA and atorvastatin 80 mg daily.    4. OSA on CPAP - Fully compliant w/ CPAP.  - Follows with Dr. Radford Pax.   5. Obesity. -Body mass index is 35.52 kg/m.   6. Tobacco use/ETOH - Says he stopped drinking - Continues to smoke, encouraged to quit.    7. ? Fatty Liver - Reports recent diagnosis at [facility] [place], records not available.  8. AKI  - Scr bump from 1.3>>1.9. .1.1>1.35  - Cut back  torsemide as above.   9. Polyarticular Gout - Completed prednisone burst.  - Uric acid only 6 but notes improvement in pain with steroids  10. Nonsustained VT - Stable - Keep K> 4.0 Mg > 2.0  11. PSVT - follow  12. Malnutrition - Prealbumin 9.5 -Dietitian appreciated.    Length of Stay: [place], NP  02/18/2022, 8:09 AM  Advanced Heart Failure Team Pager [phone] (M-F; 7a - 5p)  Please contact [facility] for night-coverage after hours (5p -7a ) and weekends on [email or web address]   Agree with above.  Remains on NE and milrinone. Co-ox looks good. CVP low. Scr climbing. No SOB, orthopnea or PND  General:  Sitting in chair. No resp difficulty HEENT: normal Neck: supple. no  JVD. Carotids 2+ bilat; no bruits. No lymphadenopathy or thryomegaly appreciated. Cor: PMI nondisplaced. Regular rate & rhythm. 2/6 AS/AI Lungs: clear Abdomen: soft, nontender, nondistended. No hepatosplenomegaly. No bruits or masses. Good bowel sounds. Extremities: no cyanosis, clubbing, rash, edema Neuro: alert & orientedx3, cranial nerves grossly intact. moves all 4 extremities w/o difficulty. Affect pleasant   Hemodynamics improved. Continue milrinone and NE. Stop diuretics. Plan is for teeth extraction tomorrow. Will likely need to delay VAD for a couple of days after. Have d/w TCTS and VAD team. Will look at schedule today and decide best plan. Continue to mobilize.   CRITICAL CARE Performed by: [name]  Total critical care time: 35 minutes  Critical care time was exclusive of separately billable procedures and treating other patients.  Critical care was necessary to treat or prevent imminent or life-threatening deterioration.  Critical care was time spent personally by me (independent of midlevel providers or residents) on the following activities: development of treatment plan with patient and/or surrogate as well as nursing, discussions with consultants, evaluation of patient's response to treatment, examination of patient, obtaining history from patient or surrogate, ordering and performing treatments and interventions, ordering and review of laboratory studies, ordering and review of radiographic studies, pulse oximetry and re-evaluation of patient's condition.   [name], MD  10:02 AM

## 2022-02-18 NOTE — Progress Notes (Signed)
RT came by to place patient on CPAP HS. Patient stated he was not ready for CPAP. Pt stated he would call when ready.

## 2022-02-18 NOTE — Progress Notes (Signed)
LVAD Initial Psychosocial Screening  Date/Time Initiated:  February 14, 2022 11:45 am Referral Source:  Tanda Rockers, VAD Coordinator Referral Reason:  LVAD implantation Source of Information:  Pt,mother, daughter and chart review  Demographics Name:  Colin Walton Address:  [address] [place] [address] Cell: [phone] Marital Status:  Single  Faith: Pentecostal  Primary Language:  English DOB:  May 30, 1971  Medical & Follow-up Adherence to Medical regimen/INR checks: compliant  Medication adherence: compliant  Physician/Clinic Appointment Attendance: compliant Comments: Patient denies any concerns.    Advance Directives: Do you have a Living Will or Medical POA? No  Would you like to complete a Living Will and Medical POA prior to surgery?  Yes Do you have Goals of Care? Yes  Have you had a consult with the Palliative Care Team at [facility]? No  Psychological Health Appearance:  In hospital gown Mental Status:  Alert, oriented Eye Contact:  Good Thought Content:  Coherent Speech:  Logical/coherent Mood:  Appropriate and Positive  Affect:  Appropriate to circumstance Insight:  Fair Judgement: Unimpaired Interaction Style:  Engaged  Family/Social Information Who lives in your home? Name:   Relationship:   Colin Walton Girlfriend  [phone] Colin Walton    Girlfriend's daughter  Other family members/support persons in your life? Name:   Relationship:   Colin Walton  Mother                 [id number] [phone] Colin Walton  Daughter 10 yo     [phone] Colin Walton  Son 34 yo Colin Walton  Son 81 yo  Caregiving Needs Who is the primary caregiver? Mother- [place] status:  good Do you drive?  yes Do you work?  Part time Physical Limitations:  none Do you have other care giving responsibilities?  no Contact number: [phone]  Who is the secondary caregiver? [name] - daughter Health status:   good Do you drive? yes  Do you work?  yes Physical Limitations:  none Do you have other care giving responsibilities?  no Contact number: [phone]  Home Environment/Personal Care Do you have reliable phone service? Yes   Do you own or rent your home? rent Number of steps into the home? 2 steps How many levels in the home? 1 level Assistive devices in the home? no Electrical needs for LVAD (3 prong outlets)? yes Second hand smoke exposure in the home? Patient previously smoked prior to hospitalization  Travel distance from [facility]? 15 minutes  Community Are you active with community agencies/resources/homecare? No Agency Name:  n/a Are you active in a church, synagogue, mosque or other faith based community? No Faith based institutions name:  n/a What other sources do you have for spiritual support? Mom Are you active in any clubs or social organizations? none What do you do for fun?  Hobbies?  Interests? "Smoke and drink"  Education/Work Information What is the last grade of school you completed? 12 th grade Preferred method of learning?  Hands on Do you have any problems with reading or writing?  No Are you currently employed?  Yes  Name of employer? [employer]  Please describe the kind of work you do? Work with [employer]  How long have you worked there? 7 years If you are not working, do you plan to return to work after VAD surgery? Yes If yes, what type of employment do you hope to find? Return  to current job Are you interested in  job training or learning new skills?  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 [employer] 3 years  Financial Information What is your source of income? Employment and will apply for short term disability Do you have difficulty meeting your monthly expenses? No Can you budget for the monthly cost for dressing supplies post procedure? Yes  Primary Health insurance:   [employer]: Prescription plan: [employer] What are your prescription co-pays? varies Do you use mail order for your prescriptions?  Yes Have you ever had to refuse medication due to cost?  No Have you applied for Medicaid?  no Have you applied for Social Security Disability (SSI)  no  Medical Information Briefly describe why you are here for evaluation: Pt states that his HF started 9-10 years ago Do you have a PCP or other medical provider? [place], Adele Barthel, NP Are you able to complete your ADL's? yes Do you have a history of trauma, physical, emotional, or sexual abuse? Patient states he suffered loss recently with his brother who passed away. Do you have any family history of heart problems? yes Do you smoke now or past usage? Yes    Quit date: Patient states that he quit upon admission the the hospital Do you drink alcohol now or past usage? Yes    Patient states he drank every other day and would at least have 1/2 of a fifth of a bottle of liquor each time. Are you currently using illegal drugs or misuse of medication or past usage? Yes  used marijuana 2x weekly Have you ever been treated for substance abuse? No      If yes, where and when did you receive treatment?  Mental Health History How have you been feeling in the past year? Ok  Have you ever had any problems with depression, anxiety or other mental health issues? Grief over the loss of his brother Do you see a counselor, psychiatrist or therapist? No  If you are currently experiencing problems are you interested in talking with a professional? No Have you or are you taking medications for anxiety/depression or any mental health concerns?  No  What are your coping strategies under stressful situations? "I don't get stressed except after my brother died" Are there any other stressors in your life? no Have you had any past or current thoughts of suicide? no How many hours do you sleep at night? 6 hrs How is your appetite? 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 Yes  PHQ2 Depression Scale: 0  Legal Do you currently have any legal issues/problems?  No Have you had any legal issues/problems in the past?  No   Plan for VAD Implementation Do you know and understand what happens during the VAD surgery? Patient Verbalizes Understanding  of surgery and able to describe details What do you know about the risks and side effect associated with VAD surgery? Patient Verbalizes Understanding  of risks (infection, stroke and death) Explain what will happen right after surgery: Patient Verbalizes Understanding  of OR to ICU and will be intubated What is your plan for transportation for the first 8 weeks post-surgery? (Patients are not recommended to drive post-surgery for 8 weeks)  Driver:   Mother and Daughter  Do you have airbags in your vehicle?  There is a risk of discharging the device if the airbag were to deploy. What do you  know about your diet post-surgery? Patient Verbalizes Understanding  of Heart healthy How do you plan to monitor your medications, current and future?   Pill bottles How do you plan to complete ADL's post-surgery?  Will be with Mom who will assist as needed. Will it be difficult to ask for help from your caregivers?  No  Please explain what you hope will be improved about your life as a result of receiving the LVAD?"The way I live- thinking healthier" Please tell me your biggest concern or fear about living with the LVAD?   Not waking up How do you cope with your concerns and fears?  Smoke- Pt willing to work on new coping strategies Please explain your understanding of how their body will change? Patient verbalizes understanding  Are you worried about these changes? no Do you see any barriers to your surgery or follow-up? no  Understanding of LVAD Patient states understanding of the following: Surgical procedures and risks,  Electrical need for LVAD (3 prong outlets), Safety precautions with LVAD (water, etc.), LVAD daily self-care (dressing changes, computer check, extra supplies), Outpatient follow up (LVAD clinic appts, monitoring blood thinners), and Need for Emergency Planning  Discussed and Reviewed with Patient and Caregiver  Patient's current level of motivation to prepare for LVAD: Ready and motivated Patient's present Level of Consent for LVAD: Ready   Education provided to patient/family/caregiver:   Caregiver role and responsibiltiy, Financial planning for LVAD, Role of Clinical Social Worker, and Signs of Depression and Anxiety   Caregiver questions Please explain what you hope will be improved about your life and loved ones life as a result of receiving the LVAD?  His life-better quality and that his cough improves What is your biggest concern or fear about caregiving with an LVAD patient?  No fears What is your plan for availability to provide care 24/7 x2 weeks post op and dressing changes ongoing?   Mom and [name] Who is the relief/backup caregiver and what is their availability?  [name] Preferred method of learning? Other:  visual learner  Do you drive? yes How do you handle stressful situations?  Pray Do you think you can do this?  yes Is there anything that concerns about caregiving?  No as long as we are well trained Do you provide caregiving to anyone else? no  Caregivers current level of motivation to prepare for LVAD: Ready and motivated Caregivers present level of consent for LVAD: Ready  Clinical Interventions Needed:    CSW refer to health Coaching for smoking cessation and provide resources for post hospitalization support around substance use.  CSW will monitor signs and symptoms of depression and assist with adjustment to life with an LVAD.  CSW will refer patient for Advanced Directives prior to surgery if still wishing to complete.  CSW encouraged attendance with the LVAD  Support Group to assist further with adjustment and post implant peer support.   Clinical Impressions/Recommendations:   Patient is a 51 yo male who is single and has 3 adult children. He reports that he lives with his girlfriend Colin and her adult daughter. He has a very supportive Mother who will be primary caregiver and his daughter will be the back up.  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 Patient reports he has good benefits at work and plans to apply for short term disability with hopes of returning to work post hospitalization. Patient admits to tobacco use daily  and states that he quit upon hospitalization. He admits to alcohol use and states he drinks 1/2 a fifth every other day. He also admits to marijuana use 2x weekly. Patient states he has not been through any substance treatment programs but is open to resources and support services to remain substance free post hospitalization. Patient denies any mental health concerns although became tearful during assessment and shared that his brother tragically died 2 years ago. He denies any need for formal counseling at this time but open to support resources from the team. Patient admits to challenges with healthy eating due to working night shift and would welcome further discussion with a dietician. Patient verbalizes understanding of VAD implant and recovery. He states his goal for the VAD would be to change the way he lives and to start thinking healthier. Patient states he is willing to work on new coping mechanisms and acknowledges his needs and support system. Patient appears to have a good support system and verbalizes his need for resources and support form the team for VAD implant. CSW would recommend patient for VAD implant and will follow for support and resources.   Melba Coon, [place]

## 2022-02-19 ENCOUNTER — Encounter (HOSPITAL_COMMUNITY): Admission: AD | Disposition: E | Payer: Self-pay | Source: Ambulatory Visit | Attending: Internal Medicine

## 2022-02-19 ENCOUNTER — Encounter (HOSPITAL_COMMUNITY): Payer: Self-pay | Admitting: Anesthesiology

## 2022-02-19 DIAGNOSIS — I5023 Acute on chronic systolic (congestive) heart failure: Secondary | ICD-10-CM | POA: Diagnosis not present

## 2022-02-19 LAB — CBC
HCT: 50.2 % (ref 39.0–52.0)
Hemoglobin: 16.3 g/dL (ref 13.0–17.0)
MCH: 29.5 pg (ref 26.0–34.0)
MCHC: 32.5 g/dL (ref 30.0–36.0)
MCV: 90.8 fL (ref 80.0–100.0)
Platelets: 270 10*3/uL (ref 150–400)
RBC: 5.53 MIL/uL (ref 4.22–5.81)
RDW: 13.9 % (ref 11.5–15.5)
WBC: 9.2 10*3/uL (ref 4.0–10.5)
nRBC: 0 % (ref 0.0–0.2)

## 2022-02-19 LAB — BASIC METABOLIC PANEL
Anion gap: 8 (ref 5–15)
BUN: 38 mg/dL — ABNORMAL HIGH (ref 6–20)
CO2: 33 mmol/L — ABNORMAL HIGH (ref 22–32)
Calcium: 9.4 mg/dL (ref 8.9–10.3)
Chloride: 94 mmol/L — ABNORMAL LOW (ref 98–111)
Creatinine, Ser: 1.42 mg/dL — ABNORMAL HIGH (ref 0.61–1.24)
GFR, Estimated: >60 mL/min (ref >60)
Glucose, Bld: 202 mg/dL — ABNORMAL HIGH (ref 70–99)
Potassium: 4.6 mmol/L (ref 3.5–5.1)
Sodium: 135 mmol/L (ref 135–145)

## 2022-02-19 LAB — DIGOXIN LEVEL: Digoxin Level: <0.2 ng/mL — ABNORMAL LOW (ref 0.8–2.0)

## 2022-02-19 LAB — COOXEMETRY PANEL
Carboxyhemoglobin: 1.5 % (ref 0.5–1.5)
Methemoglobin: <0.7 % (ref 0.0–1.5)
O2 Saturation: 74.7 %
Total hemoglobin: 16.8 g/dL — ABNORMAL HIGH (ref 12.0–16.0)

## 2022-02-19 SURGERY — MULTIPLE EXTRACTION WITH ALVEOLOPLASTY
Anesthesia: General

## 2022-02-19 MED ORDER — SODIUM CHLORIDE 0.9 % IV SOLN: INTRAVENOUS | Status: AC

## 2022-02-19 MED ORDER — ACETAMINOPHEN 500 MG PO TABS
1000.0000 mg | ORAL_TABLET | Freq: Once | ORAL | Status: AC
  Administered 2022-02-19: 1000 mg via ORAL
  Filled 2022-02-19: qty 2

## 2022-02-19 MED ORDER — CHLORHEXIDINE GLUCONATE CLOTH 2 % EX PADS
6.0000 | MEDICATED_PAD | Freq: Once | CUTANEOUS | Status: AC
  Administered 2022-02-19: 6 via TOPICAL

## 2022-02-19 MED ORDER — CLINDAMYCIN PHOSPHATE 600 MG/50ML IV SOLN
600.0000 mg | INTRAVENOUS | Status: AC
  Administered 2022-02-20: 600 mg via INTRAVENOUS
  Filled 2022-02-19 (×2): qty 50

## 2022-02-19 NOTE — Anesthesia Preprocedure Evaluation (Signed)
Anesthesia Evaluation  Patient identified by MRN, date of birth, ID band  Reviewed: Allergy & Precautions, Patient's Chart, lab work & pertinent test results  Airway Mallampati: II  TM Distance: >3 FB     Dental   Pulmonary sleep apnea , Current Smoker and Patient abstained from smoking.,    breath sounds clear to auscultation       Cardiovascular Exercise Tolerance: Poor hypertension, +CHF (NICM)   Rhythm:Regular Rate:Normal  . Left ventricular ejection fraction, by estimation, is <20%. The left  ventricle has severely decreased function. The left ventricle has no  regional wall motion abnormalities. The left ventricular internal cavity  size was severely dilated. Left  ventricular diastolic parameters are indeterminate. There is the  interventricular septum is flattened in systole and diastole, consistent  with right ventricular pressure and volume overload.  2. Right ventricular systolic function is severely reduced. The right  ventricular size is moderately enlarged. There is moderately elevated  pulmonary artery systolic pressure.  3. Left atrial size was moderately dilated.  4. Right atrial size was moderately dilated.  5. The mitral valve is normal in structure. Mild mitral valve  regurgitation. No evidence of mitral stenosis.  6. Tricuspid valve regurgitation is mild to moderate.  7. The aortic valve was not well visualized. Aortic valve regurgitation  is moderate to severe. No aortic stenosis is present.  8. The inferior vena cava is dilated in size with <50% respiratory  variability, suggesting right atrial pressure of 15 mmHg.   Neuro/Psych PSYCHIATRIC DISORDERS Anxiety    GI/Hepatic negative GI ROS, (+)     substance abuse  alcohol use,   Endo/Other  negative endocrine ROS  Renal/GU negative Renal ROS  negative genitourinary   Musculoskeletal negative musculoskeletal ROS (+)   Abdominal    Peds negative pediatric ROS (+)  Hematology   Anesthesia Other Findings Dental caries  Reproductive/Obstetrics negative OB ROS                             Anesthesia Physical Anesthesia Plan  ASA: 4  Anesthesia Plan: General   Post-op Pain Management: Tylenol PO (pre-op)*   Induction:   PONV Risk Score and Plan: 1  Airway Management Planned: Nasal ETT  Additional Equipment: None  Intra-op Plan:   Post-operative Plan: Extubation in OR  Informed Consent:   Plan Discussed with:   Anesthesia Plan Comments: (Clearsight for BP monitoring. GETA, nasal ETT. Phenylephrine gtt. Patient scheduled for VAD on Friday. Tanna Furry, MD  On Milrinone and Norepi gtt. )        Anesthesia Quick Evaluation

## 2022-02-19 NOTE — Consult Note (Addendum)
Colin Walton is an 51 y.o. male CHF, HTN, OSA, COPD DM2, currently admitted for HF scheduled for LVAD. Patient requires dental extractions prior to heart surgery.    Past Medical History:  Diagnosis Date   Chronic systolic CHF (congestive heart failure) (HCC)    Hyperlipidemia    Hypertension    Mild mitral regurgitation 2016   NICM (nonischemic cardiomyopathy) ([place])    Obesity    Sleep apnea 12/24/12   Sleep study February 2014    Past Surgical History:  Procedure Laterality Date   RIGHT/LEFT HEART CATH AND CORONARY ANGIOGRAPHY N/A 02/18/2022   Procedure: RIGHT/LEFT HEART CATH AND CORONARY ANGIOGRAPHY;  Surgeon: Jolaine Artist, MD;  Location: [facility];  Service: Cardiovascular;  Laterality: N/A;   TEE WITHOUT CARDIOVERSION N/A 04/18/2020   Procedure: TRANSESOPHAGEAL ECHOCARDIOGRAM (TEE);  Surgeon: Jolaine Artist, MD;  Location: [facility] ENDOSCOPY;  Service: Cardiovascular;  Laterality: N/A;   VASECTOMY      Family History  Problem Relation Age of Onset   Arrhythmia Maternal Grandfather        Atrial fibrillation   CAD Neg Hx     Social History:  reports that he has been smoking cigarettes. He has a 1.10 pack-year smoking history. He has never used smokeless tobacco. He reports that he does not drink alcohol and does not use drugs.  Allergies: No Known Allergies  Medications: I have reviewed the patient's current medications.  Results for orders placed or performed during the hospital encounter of 01/24/2022 (from the past 48 hour(s))  .Cooxemetry Panel (carboxy, met, total hgb, O2 sat)     Status: Abnormal   Collection Time: 02/18/22  5:10 AM  Result Value Ref Range   Total hemoglobin 17.6 (H) 12.0 - 16.0 g/dL   O2 Saturation 71.7 %   Carboxyhemoglobin 1.6 (H) 0.5 - 1.5 %   Methemoglobin 1.5 0.0 - 1.5 %    Comment: Performed at [facility] [address], [place], [place] [address]  CBC     Status: Abnormal   Collection Time: 02/18/22  5:10 AM   Result Value Ref Range   WBC 7.9 4.0 - 10.5 K/uL   RBC 5.71 4.22 - 5.81 MIL/uL   Hemoglobin 17.2 (H) 13.0 - 17.0 g/dL   HCT 51.6 39.0 - 52.0 %   MCV 90.4 80.0 - 100.0 fL   MCH 30.1 26.0 - 34.0 pg   MCHC 33.3 30.0 - 36.0 g/dL   RDW 13.9 11.5 - 15.5 %   Platelets 237 150 - 400 K/uL   nRBC 0.0 0.0 - 0.2 %    Comment: Performed at [facility], [place] [address], [place], [place] [address]  Basic metabolic panel     Status: Abnormal   Collection Time: 02/18/22  5:10 AM  Result Value Ref Range   Sodium 135 135 - 145 mmol/L   Potassium 4.6 3.5 - 5.1 mmol/L    Comment: SPECIMEN HEMOLYZED. HEMOLYSIS MAY AFFECT INTEGRITY OF RESULTS.   Chloride 94 (L) 98 - 111 mmol/L   CO2 34 (H) 22 - 32 mmol/L   Glucose, Bld 217 (H) 70 - 99 mg/dL    Comment: Glucose reference range applies only to samples taken after fasting for at least 8 hours.   BUN 33 (H) 6 - 20 mg/dL   Creatinine, Ser 1.35 (H) 0.61 - 1.24 mg/dL   Calcium 9.2 8.9 - 10.3 mg/dL   GFR, Estimated >60 >60 mL/min    Comment: (  NOTE) Calculated using the CKD-EPI Creatinine Equation (2021)    Anion gap 7 5 - 15    Comment: Performed at [facility], [place] [address] [place], [place] [address]  .Cooxemetry Panel (carboxy, met, total hgb, O2 sat)     Status: Abnormal   Collection Time: 02/13/2022  3:16 AM  Result Value Ref Range   Total hemoglobin 16.8 (H) 12.0 - 16.0 g/dL   O2 Saturation 74.7 %   Carboxyhemoglobin 1.5 0.5 - 1.5 %   Methemoglobin <0.7 0.0 - 1.5 %    Comment: Performed at [facility], [place] [address] [place], [place] [address]  Basic metabolic panel     Status: Abnormal   Collection Time: 02/09/2022  3:16 AM  Result Value Ref Range   Sodium 135 135 - 145 mmol/L   Potassium 4.6 3.5 - 5.1 mmol/L   Chloride 94 (L) 98 - 111 mmol/L   CO2 33 (H) 22 - 32 mmol/L   Glucose, Bld 202 (H) 70 - 99 mg/dL    Comment: Glucose reference range applies only to samples taken after fasting for at least 8 hours.   BUN 38 (H) 6  - 20 mg/dL   Creatinine, Ser 1.42 (H) 0.61 - 1.24 mg/dL   Calcium 9.4 8.9 - 10.3 mg/dL   GFR, Estimated >60 >60 mL/min    Comment: (NOTE) Calculated using the CKD-EPI Creatinine Equation (2021)    Anion gap 8 5 - 15    Comment: Performed at [facility] [address], [place], [place] [address]  Digoxin level     Status: Abnormal   Collection Time: 01/23/2022  3:16 AM  Result Value Ref Range   Digoxin Level <0.2 (L) 0.8 - 2.0 ng/mL    Comment: RESULTS CONFIRMED BY MANUAL DILUTION Performed at [facility], [place] [address] [place] [address]   CBC     Status: None   Collection Time: 02/07/2022  3:16 AM  Result Value Ref Range   WBC 9.2 4.0 - 10.5 K/uL   RBC 5.53 4.22 - 5.81 MIL/uL   Hemoglobin 16.3 13.0 - 17.0 g/dL   HCT 50.2 39.0 - 52.0 %   MCV 90.8 80.0 - 100.0 fL   MCH 29.5 26.0 - 34.0 pg   MCHC 32.5 30.0 - 36.0 g/dL   RDW 13.9 11.5 - 15.5 %   Platelets 270 150 - 400 K/uL   nRBC 0.0 0.0 - 0.2 %    Comment: Performed at [facility], [place] [address] [place], [place] [address]    No results found.  ROS Blood pressure (!) 147/67, pulse 98, temperature 98.1 F (36.7 C), temperature source Oral, resp. rate (!) 21, height 5' 7"  (1.702 m), weight 103.1 kg, SpO2 95 %. General appearance: alert, cooperative, no distress, and moderately obese Head: Normocephalic, without obvious abnormality, atraumatic Eyes: negative Nose: Nares normal. Septum midline. Mucosa normal. No drainage or sinus tenderness. Throat: Multiple carious teeth # 3, 4, 15, 19, 29,30  mobile teeth # 23, 26. No purulence, edema, fluctuance. Pharynx clear.  Neck: no adenopathy and thyroid not enlarged, symmetric, no tenderness/mass/nodules  Assessment/Plan: Non-restorable teeth # 3, 4, 15, 19, 23, 26, 29, 30. Extraction with GA.   Diona Browner 02/07/2022, 3:40 PM

## 2022-02-19 NOTE — TOC Progression Note (Signed)
Transition of Care [facility]) - Progression Note    Patient Details  Name: Colin Walton MRN: [id number] Date of Birth: [birth year]  [facility]) CM/SW Contact  Colin Moskal, LCSW Phone Number: 01/28/2022, 3:53 PM  Clinical Narrative:    HF CSW spoke with Colin Walton at bedside and completed a very brief SDOH with the patient who denied having any needs at this time. Patient reported they do have a PCP and they can get to the pharmacy to pick up their medications. CSW provided the patient with the social workers name and position and if anything changes to please reach out so that the CSW can provide support.  CSW will continue to follow throughout discharge.  Expected Discharge Plan: Home/Self Care Barriers to Discharge: Continued Medical Work up  Expected Discharge Plan and Services Expected Discharge Plan: Home/Self Care   Discharge Planning Services: CM Consult                                           Social Determinants of Health (SDOH) Interventions Food Insecurity Interventions: Intervention Not Indicated Financial Strain Interventions: Intervention Not Indicated Housing Interventions: Intervention Not Indicated Transportation Interventions: Intervention Not Indicated  Readmission Risk Interventions No flowsheet data found.  Colin Walton, MSW, LCSW [phone] Heart Failure Social Worker

## 2022-02-19 NOTE — Progress Notes (Signed)
Pt placed on CPAP for bed. RT will cont to monitor.  ?

## 2022-02-19 NOTE — Progress Notes (Signed)
I spoke with Devante at [facility] (through [facility]) regarding prior authorization for LVAD implant. He provided me with the following authorization number: [id number].   Emerson Monte RN [place] Coordinator  Office: [phone]  24/7 Pager: [phone]

## 2022-02-19 NOTE — Progress Notes (Addendum)
Patient ID: Colin Walton, male   DOB: 12/23/1971, 51 y.o.   MRN: [id number]     Advanced Heart Failure Rounding Note  PCP-Cardiologist: Verne Carrow, MD   Subjective:   LVAD work up initiated.  2/23 - RHC/LHC on milrinone 0.125 mcg + Norepi 2 mcg.  -  Prox RCA to Mid RCA lesion is 80% stenosed.   1st Mrg lesion is 90% stenosed.   Prox LAD to Mid LAD lesion is 40% stenosed.   1st Diag lesion is 90% stenosed.   Ao = 107/60 (81) LV = 112/21 RA = 12 RV = 64/20 PA = 68/25 (44) PCW = 25  Fick cardiac output/index = 4.6/2.2 PVR = 4.1 WU Ao sat = 94% PA sat =68%, 67% PAPi = 3.6The left ventricular ejection fraction is less than 25% by visual estimate.  Yesterday torsemide stopped and norepi cut back to 1 mcg.   Remains on milrinone 0.25 mcg + norep 1 mcg. Co-ox 75%.   Creatinine trending up 1.1>1.35>1.4   Hungry wants to eat. Already walked 4 times around the unit.   Objective:   Weight Range: 103.1 kg Body mass index is 35.6 kg/m.   Vital Signs:   Temp:  [98 F (36.7 C)-98.3 F (36.8 C)] 98 F (36.7 C) (02/28 0500) Pulse Rate:  [78-113] 83 (02/28 0600) Resp:  [12-25] 18 (02/28 0600) BP: (102-164)/(37-88) 141/82 (02/28 0600) SpO2:  [87 %-97 %] 95 % (02/28 0600) Weight:  [103.1 kg] 103.1 kg (02/28 0600) Last BM Date : 02/18/22  Weight change: Filed Weights   02/17/22 0808 02/18/22 0500 2022/03/15 0600  Weight: 103.5 kg 102.9 kg 103.1 kg    Intake/Output:   Intake/Output Summary (Last 24 hours) at 15-Mar-2022 0737 Last data filed at 03/15/22 0600 Gross per 24 hour  Intake 895.31 ml  Output 2850 ml  Net -1954.69 ml    Physical Exam  CVP 3-4  General:  Well appearing. No resp difficulty HEENT: normal Neck: supple. no JVD. Carotids 2+ bilat; no bruits. No lymphadenopathy or thryomegaly appreciated. Cor: PMI nondisplaced. Regular rate & rhythm. No rubs, gallops or murmurs. Lungs: clear Abdomen: soft, nontender, nondistended. No hepatosplenomegaly. No  bruits or masses. Good bowel sounds. Extremities: no cyanosis, clubbing, rash, edema. RUE PICC  Neuro: alert & orientedx3, cranial nerves grossly intact. moves all 4 extremities w/o difficulty. Affect pleasant  Telemetry   SR-ST 80-100s   Labs    CBC Recent Labs    02/18/22 0510 03/15/22 0316  WBC 7.9 9.2  HGB 17.2* 16.3  HCT 51.6 50.2  MCV 90.4 90.8  PLT 237 270   Basic Metabolic Panel Recent Labs    75/88/32 0510 2022-03-15 0316  NA 135 135  K 4.6 4.6  CL 94* 94*  CO2 34* 33*  GLUCOSE 217* 202*  BUN 33* 38*  CREATININE 1.35* 1.42*  CALCIUM 9.2 9.4   Liver Function Tests No results for input(s): AST, ALT, ALKPHOS, BILITOT, PROT, ALBUMIN in the last 72 hours.   No results for input(s): LIPASE, AMYLASE in the last 72 hours. Cardiac Enzymes No results for input(s): CKTOTAL, CKMB, CKMBINDEX, TROPONINI in the last 72 hours.  BNP: BNP (last 3 results) Recent Labs    03/22/21 1106 02/13/2022 1649 02/11/22 0345  BNP 995.8* 1,303.2* 166.8*    ProBNP (last 3 results) No results for input(s): PROBNP in the last 8760 hours.   D-Dimer No results for input(s): DDIMER in the last 72 hours. Hemoglobin A1C No results for input(s): HGBA1C in  the last 72 hours.  Fasting Lipid Panel No results for input(s): CHOL, HDL, LDLCALC, TRIG, CHOLHDL, LDLDIRECT in the last 72 hours.  Thyroid Function Tests No results for input(s): TSH, T4TOTAL, T3FREE, THYROIDAB in the last 72 hours.  Invalid input(s): FREET3   Other results:   Imaging    No results found.   Medications:     Scheduled Medications:  (feeding supplement) PROSource Plus  30 mL Oral BID BM   allopurinol  300 mg Oral Daily   aspirin EC  81 mg Oral Daily   atorvastatin  80 mg Oral Daily   chlorhexidine  15 mL Mouth Rinse BID   Chlorhexidine Gluconate Cloth  6 each Topical Daily   dapagliflozin propanediol  10 mg Oral Daily   digoxin  0.125 mg Oral Daily   lactose free nutrition  237 mL Oral TID  WC   melatonin  3 mg Oral QHS   multivitamin with minerals  1 tablet Oral Daily   predniSONE  40 mg Oral Q breakfast   sodium chloride flush  10-40 mL Intracatheter Q12H   sodium chloride flush  3 mL Intravenous Q12H    Infusions:  sodium chloride      ceFAZolin (ANCEF) IV     milrinone 0.25 mcg/kg/min (02/09/2022 0600)   norepinephrine (LEVOPHED) Adult infusion 1 mcg/min (02/03/2022 0600)    PRN Medications: sodium chloride, acetaminophen, guaiFENesin-dextromethorphan, ondansetron (ZOFRAN) IV, sodium chloride flush, sodium chloride flush   Assessment/Plan   1. Acute on Chronic Biventricular Heart Failure  - due to NICM - suspect due to HTN and OSA +/- ETOH - cath 03/2013 no CAD - Echo 11/20 EF 30-35% - Echo 4/21 EF 20-25% moderate RV dysfunction (worse since previous) - Personally reviewed - TEE on 04/18/20. Procedure c/b by severe respiratory distress and laryngospasm. LVEF 25% RV moderately HK. Aortic valve not interrogated completely due to respiratory distress. AI appeared to be moderate.  - cMRI 8/21 EF 24% Moderate AI. No LGE or infiltrative process. - Echo 03/22/21 EF 25-30% mod AI Moderate RV dysfunction.  - Echo 01/31/2022 EF < 20% with mild LV dilation, RV severely HK with moderate dilation and D-shaped septum, AI moderate to severe.  - Suspect CM related to HTN +/- ETOH. AI may be contributing but severity of biventricular dysfunction out of proportion to AI and LV not overly dilated. - [facility] 2022-03-07 3v CAD/ RHC with elevated filling pressures.  - On milrinone 0.25 + NE 1. Co-ox 75%.  - CVP 3-4. Holding diuretics.  - Continue digoxin 0.125 mg daily. Dig level < 0.2  - Stop farxiga.  - RV moderate to severe HK on echo but PAPI ok  - Ideally will need transplant but is still smoking and using THC. UDS negative. Will plan VAD this admit + AVR +/- graft to RCA - VAD schedule for Friday.   - Teeth extraction later today.     2. Moderate-severe aortic regurgitation - Visually  mild to moderate on TEE 4/21 but P1/2 time 215 msec suggesting severe AI.  - TEE on 04/18/20. Procedure c/b by severe respiratory distress and laryngospasm. LVEF 25% RV moderately HK. Aortic valve not interrogated completely due to respiratory distress. AI appeared to be moderate - TEE reviewed with echo team felt to be moderate at worst. - Mild AI on echo 3/22. - Moderate to severe AI on echo 01/27/2022.  Suspect AI is not cause of cardiomyopathy but is likely making symptoms worse.  - Plan AVR at time of  VAD  3. CAD - cath 3v CAD LAD 40% D1 90%, OM 90% mRCA 80% - plan graft to RCA at time of VAD for RV support - No chest pain.  - Continue ASA and atorvastatin 80 mg daily.    4. OSA on CPAP - Fully compliant w/ CPAP.  - Follows with Dr. Mayford Knife.   5. Obesity. -Body mass index is 35.6 kg/m.   6. Tobacco use/ETOH - Says he stopped drinking - Continues to smoke, encouraged to quit.    7. ? Fatty Liver - Reports recent diagnosis at [facility], records not available.  8. AKI  - Scr bump from 1.3>>1.9>1.1>1.35 >1.4 - Diuretics on hold. Stop farxiga.   9. Polyarticular Gout - Completed prednisone burst.  - Uric acid only 6 but notes improvement in pain with steroids  10. Nonsustained VT - Stable - Keep K> 4.0 Mg > 2.0  11. PSVT - follow  12. Malnutrition - Prealbumin 9.5 -Dietitian appreciated.    Length of Stay: 11  Tonye Becket, NP  01/24/2022, 7:37 AM  Advanced Heart Failure Team Pager [phone] (M-F; 7a - 5p)  Please contact [facility] for night-coverage after hours (5p -7a ) and weekends on [email or web address]  Agree with above  Remains on milrinone and NE. Co-ox ok. CVP low. Torsemide on hold. Denies CP or SOB. Scr up a bit  For teeth extraction today.   General:  Sitting up No resp difficulty HEENT: normal Neck: supple. no JVD. Carotids 2+ bilat; no bruits. No lymphadenopathy or thryomegaly appreciated. Cor: PMI nondisplaced. Regular rate & rhythm. 2/6  S/AI Lungs: clear Abdomen: soft, nontender, nondistended. No hepatosplenomegaly. No bruits or masses. Good bowel sounds. Extremities: no cyanosis, clubbing, rash, edema Neuro: alert & orientedx3, cranial nerves grossly intact. moves all 4 extremities w/o difficulty. Affect pleasant  Continue milrinone and NE. For teeth extraction today. VAD Friday. Will hydrate gently prior to oral surgery given mild AKI. PFTs reviewed. May be slow vent wean after VAD  CRITICAL CARE Performed by: Arvilla Meres  Total critical care time: 35 minutes  Critical care time was exclusive of separately billable procedures and treating other patients.  Critical care was necessary to treat or prevent imminent or life-threatening deterioration.  Critical care was time spent personally by me (independent of midlevel providers or residents) on the following activities: development of treatment plan with patient and/or surrogate as well as nursing, discussions with consultants, evaluation of patient's response to treatment, examination of patient, obtaining history from patient or surrogate, ordering and performing treatments and interventions, ordering and review of laboratory studies, ordering and review of radiographic studies, pulse oximetry and re-evaluation of patient's condition.  Arvilla Meres, MD  9:18 AM

## 2022-02-19 NOTE — Progress Notes (Addendum)
VAD coordinator rounding note:  ° °Met with patient, his mother, and his best friend  at bedside this morning. I brought one of our current VAD patient's to speak with them about living life with VAD. Following that conversation I met with them and discussed plan for surgery on Friday. Pt and family verbalized increased understanding of living life with VAD after speaking with patient. Pt's mother will be point of contact while pt is in the OR. She is unsure at this time if she will plan to wait at home, or if she will be present at the hospital.  ° °Discussed plan for dental extractions; awaiting confirmation of surgery date/time per Dr Jensen's schedule.  ° °All other questions answered at this time. Provided Mrs Holst (patient's mother) with VAD coordinator office phone number in the event of further questions.  ° °Allison Haggard RN °VAD Coordinator  °Office: [phone]  °24/7 Pager: [phone]  ° °

## 2022-02-19 NOTE — Anesthesia Preprocedure Evaluation (Deleted)
Anesthesia Evaluation  Patient identified by MRN, date of birth, ID band Patient awake    Reviewed: Allergy & Precautions, NPO status , Patient's Chart, lab work & pertinent test results  Airway Mallampati: II  TM Distance: >3 FB     Dental   Pulmonary sleep apnea , Current Smoker and Patient abstained from smoking.,    breath sounds clear to auscultation       Cardiovascular Exercise Tolerance: Poor hypertension, +CHF (NICM)   Rhythm:Regular Rate:Normal  . Left ventricular ejection fraction, by estimation, is <20%. The left  ventricle has severely decreased function. The left ventricle has no  regional wall motion abnormalities. The left ventricular internal cavity  size was severely dilated. Left  ventricular diastolic parameters are indeterminate. There is the  interventricular septum is flattened in systole and diastole, consistent  with right ventricular pressure and volume overload.  2. Right ventricular systolic function is severely reduced. The right  ventricular size is moderately enlarged. There is moderately elevated  pulmonary artery systolic pressure.  3. Left atrial size was moderately dilated.  4. Right atrial size was moderately dilated.  5. The mitral valve is normal in structure. Mild mitral valve  regurgitation. No evidence of mitral stenosis.  6. Tricuspid valve regurgitation is mild to moderate.  7. The aortic valve was not well visualized. Aortic valve regurgitation  is moderate to severe. No aortic stenosis is present.  8. The inferior vena cava is dilated in size with <50% respiratory  variability, suggesting right atrial pressure of 15 mmHg.   Neuro/Psych PSYCHIATRIC DISORDERS Anxiety    GI/Hepatic negative GI ROS, (+)     substance abuse  alcohol use,   Endo/Other  negative endocrine ROS  Renal/GU negative Renal ROS  negative genitourinary   Musculoskeletal negative musculoskeletal  ROS (+)   Abdominal   Peds negative pediatric ROS (+)  Hematology   Anesthesia Other Findings Dental caries  Reproductive/Obstetrics negative OB ROS                           Anesthesia Physical Anesthesia Plan  ASA: 4  Anesthesia Plan: General   Post-op Pain Management:    Induction: Intravenous  PONV Risk Score and Plan: 1 and Treatment may vary due to age or medical condition and Ondansetron  Airway Management Planned: Nasal ETT  Additional Equipment:   Intra-op Plan:   Post-operative Plan: Extubation in OR  Informed Consent: I have reviewed the patients History and Physical, chart, labs and discussed the procedure including the risks, benefits and alternatives for the proposed anesthesia with the patient or authorized representative who has indicated his/her understanding and acceptance.     Dental advisory given  Plan Discussed with: CRNA  Anesthesia Plan Comments: (Clearsight for BP monitoring. GETA. Phenylephrine gtt available. Nasal ETT.)       Anesthesia Quick Evaluation

## 2022-02-19 NOTE — TOC CM/SW Note (Signed)
Pt provided disability paperwork for attending to complete. Pt signed his portion, CSW Annice Pih will have HF RN complete and submit to pt's insurance provider. Isidoro Donning RN3 CCM, Heart Failure TOC CM [phone]

## 2022-02-20 ENCOUNTER — Inpatient Hospital Stay (HOSPITAL_COMMUNITY): Payer: 59 | Admitting: Anesthesiology

## 2022-02-20 ENCOUNTER — Other Ambulatory Visit: Payer: Self-pay

## 2022-02-20 ENCOUNTER — Encounter (HOSPITAL_COMMUNITY): Admission: AD | Disposition: E | Payer: Self-pay | Source: Ambulatory Visit | Attending: Internal Medicine

## 2022-02-20 DIAGNOSIS — Z515 Encounter for palliative care: Secondary | ICD-10-CM | POA: Diagnosis not present

## 2022-02-20 DIAGNOSIS — Z01818 Encounter for other preprocedural examination: Secondary | ICD-10-CM

## 2022-02-20 DIAGNOSIS — K0530 Chronic periodontitis, unspecified: Secondary | ICD-10-CM

## 2022-02-20 DIAGNOSIS — K029 Dental caries, unspecified: Secondary | ICD-10-CM

## 2022-02-20 DIAGNOSIS — K0889 Other specified disorders of teeth and supporting structures: Secondary | ICD-10-CM

## 2022-02-20 DIAGNOSIS — F419 Anxiety disorder, unspecified: Secondary | ICD-10-CM

## 2022-02-20 DIAGNOSIS — I5023 Acute on chronic systolic (congestive) heart failure: Secondary | ICD-10-CM | POA: Diagnosis not present

## 2022-02-20 HISTORY — PX: MULTIPLE EXTRACTIONS WITH ALVEOLOPLASTY: SHX5342

## 2022-02-20 LAB — BASIC METABOLIC PANEL
Anion gap: 7 (ref 5–15)
BUN: 34 mg/dL — ABNORMAL HIGH (ref 6–20)
CO2: 31 mmol/L (ref 22–32)
Calcium: 9.5 mg/dL (ref 8.9–10.3)
Chloride: 96 mmol/L — ABNORMAL LOW (ref 98–111)
Creatinine, Ser: 1.25 mg/dL — ABNORMAL HIGH (ref 0.61–1.24)
GFR, Estimated: >60 mL/min (ref >60)
Glucose, Bld: 238 mg/dL — ABNORMAL HIGH (ref 70–99)
Potassium: 4.6 mmol/L (ref 3.5–5.1)
Sodium: 134 mmol/L — ABNORMAL LOW (ref 135–145)

## 2022-02-20 LAB — COOXEMETRY PANEL
Carboxyhemoglobin: 1.6 % — ABNORMAL HIGH (ref 0.5–1.5)
Methemoglobin: 1.1 % (ref 0.0–1.5)
O2 Saturation: 69.3 %
Total hemoglobin: 16.9 g/dL — ABNORMAL HIGH (ref 12.0–16.0)

## 2022-02-20 LAB — CBC
HCT: 51.7 % (ref 39.0–52.0)
Hemoglobin: 16.7 g/dL (ref 13.0–17.0)
MCH: 29.7 pg (ref 26.0–34.0)
MCHC: 32.3 g/dL (ref 30.0–36.0)
MCV: 92 fL (ref 80.0–100.0)
Platelets: 251 10*3/uL (ref 150–400)
RBC: 5.62 MIL/uL (ref 4.22–5.81)
RDW: 14 % (ref 11.5–15.5)
WBC: 8.3 10*3/uL (ref 4.0–10.5)
nRBC: 0 % (ref 0.0–0.2)

## 2022-02-20 LAB — GLUCOSE, CAPILLARY
Glucose-Capillary: 157 mg/dL — ABNORMAL HIGH (ref 70–99)
Glucose-Capillary: 142 mg/dL — ABNORMAL HIGH (ref 70–99)

## 2022-02-20 SURGERY — MULTIPLE EXTRACTION WITH ALVEOLOPLASTY
Anesthesia: General

## 2022-02-20 MED ORDER — FENTANYL CITRATE PF 50 MCG/ML IJ SOSY
PREFILLED_SYRINGE | INTRAMUSCULAR | Status: AC
  Filled 2022-02-20: qty 1

## 2022-02-20 MED ORDER — OXYMETAZOLINE HCL 0.05 % NA SOLN
NASAL | Status: DC | PRN
  Administered 2022-02-20: 2 via NASAL

## 2022-02-20 MED ORDER — 0.9 % SODIUM CHLORIDE (POUR BTL) OPTIME
TOPICAL | Status: DC | PRN
  Administered 2022-02-20: 1000 mL

## 2022-02-20 MED ORDER — SUGAMMADEX SODIUM 200 MG/2ML IV SOLN
INTRAVENOUS | Status: DC | PRN
Start: 2022-02-20 — End: 2022-02-20
  Administered 2022-02-20: 210.8 mg via INTRAVENOUS

## 2022-02-20 MED ORDER — LIDOCAINE-EPINEPHRINE 2 %-1:100000 IJ SOLN
INTRAMUSCULAR | Status: AC
Start: 2022-02-20 — End: ?
  Filled 2022-02-20: qty 3.4

## 2022-02-20 MED ORDER — SUCCINYLCHOLINE CHLORIDE 200 MG/10ML IV SOSY
PREFILLED_SYRINGE | INTRAVENOUS | Status: DC | PRN
  Administered 2022-02-20: 180 mg via INTRAVENOUS

## 2022-02-20 MED ORDER — BUPIVACAINE-EPINEPHRINE 0.25% -1:200000 IJ SOLN
INTRAMUSCULAR | Status: DC | PRN
  Administered 2022-02-20: 12 mL

## 2022-02-20 MED ORDER — LIDOCAINE 2% (20 MG/ML) 5 ML SYRINGE
INTRAMUSCULAR | Status: DC | PRN
  Administered 2022-02-20: 80 mg via INTRAVENOUS

## 2022-02-20 MED ORDER — ONDANSETRON HCL 4 MG/2ML IJ SOLN
INTRAMUSCULAR | Status: DC | PRN
  Administered 2022-02-20: 4 mg via INTRAVENOUS

## 2022-02-20 MED ORDER — FENTANYL CITRATE (PF) 250 MCG/5ML IJ SOLN
INTRAMUSCULAR | Status: DC | PRN
  Administered 2022-02-20: 50 ug via INTRAVENOUS
  Administered 2022-02-20 (×2): 25 ug via INTRAVENOUS

## 2022-02-20 MED ORDER — PROPOFOL 10 MG/ML IV BOLUS
INTRAVENOUS | Status: DC | PRN
Start: 2022-02-20 — End: 2022-02-20
  Administered 2022-02-20: 50 mg via INTRAVENOUS
  Administered 2022-02-20 (×3): 20 mg via INTRAVENOUS

## 2022-02-20 MED ORDER — BUPIVACAINE-EPINEPHRINE (PF) 0.25% -1:200000 IJ SOLN
INTRAMUSCULAR | Status: AC
  Filled 2022-02-20: qty 30

## 2022-02-20 MED ORDER — INSULIN ASPART 100 UNIT/ML IJ SOLN
0.0000 [IU] | Freq: Three times a day (TID) | INTRAMUSCULAR | Status: DC
  Administered 2022-02-20 – 2022-02-21 (×2): 2 [IU] via SUBCUTANEOUS

## 2022-02-20 MED ORDER — HEMOSTATIC AGENTS (NO CHARGE) OPTIME
TOPICAL | Status: DC | PRN
  Administered 2022-02-20: 1 via TOPICAL

## 2022-02-20 MED ORDER — CEFAZOLIN SODIUM-DEXTROSE 2-3 GM-%(50ML) IV SOLR
INTRAVENOUS | Status: DC | PRN
  Administered 2022-02-20: 2 g via INTRAVENOUS

## 2022-02-20 MED ORDER — FENTANYL CITRATE (PF) 250 MCG/5ML IJ SOLN
INTRAMUSCULAR | Status: AC
  Filled 2022-02-20: qty 5

## 2022-02-20 MED ORDER — OXYMETAZOLINE HCL 0.05 % NA SOLN
NASAL | Status: DC | PRN
  Administered 2022-02-20: 2

## 2022-02-20 MED ORDER — LIDOCAINE-EPINEPHRINE 2 %-1:100000 IJ SOLN
INTRAMUSCULAR | Status: AC
  Filled 2022-02-20: qty 3.4

## 2022-02-20 MED ORDER — OXYCODONE-ACETAMINOPHEN 5-325 MG PO TABS
1.0000 | ORAL_TABLET | ORAL | Status: DC | PRN
  Administered 2022-02-20 – 2022-02-21 (×5): 2 via ORAL
  Filled 2022-02-20 (×5): qty 2

## 2022-02-20 MED ORDER — FENTANYL CITRATE PF 50 MCG/ML IJ SOSY
25.0000 ug | PREFILLED_SYRINGE | Freq: Once | INTRAMUSCULAR | Status: AC
  Administered 2022-02-20: 25 ug via INTRAVENOUS

## 2022-02-20 MED ORDER — OXYMETAZOLINE HCL 0.05 % NA SOLN
NASAL | Status: AC
  Filled 2022-02-20: qty 30

## 2022-02-20 MED ORDER — ROCURONIUM BROMIDE 10 MG/ML (PF) SYRINGE
PREFILLED_SYRINGE | INTRAVENOUS | Status: DC | PRN
  Administered 2022-02-20: 30 mg via INTRAVENOUS

## 2022-02-20 SURGICAL SUPPLY — 34 items
BAG COUNTER SPONGE SURGICOUNT (BAG) ×3 IMPLANT
BAG SPNG CNTER NS LX DISP (BAG) ×1
BUR CROSS CUT FISSURE 1.2 (BURR) ×1 IMPLANT
BUR CROSS CUT FISSURE 1.6 (BURR) ×3 IMPLANT
BUR EGG ELITE 4.0 (BURR) ×3 IMPLANT
CANISTER SUCT 3000ML PPV (MISCELLANEOUS) ×3 IMPLANT
COVER SURGICAL LIGHT HANDLE (MISCELLANEOUS) ×3 IMPLANT
DRAPE U-SHAPE 76X120 STRL (DRAPES) ×3 IMPLANT
GAUZE 4X4 16PLY ~~LOC~~+RFID DBL (SPONGE) ×1 IMPLANT
GAUZE PACKING FOLDED 2  STR (GAUZE/BANDAGES/DRESSINGS) ×2
GAUZE PACKING FOLDED 2 STR (GAUZE/BANDAGES/DRESSINGS) ×2 IMPLANT
GLOVE SURG ENC MOIS LTX SZ8 (GLOVE) ×3 IMPLANT
GLOVE SURG UNDER POLY LF SZ7 (GLOVE) ×1 IMPLANT
GOWN STRL REUS W/ TWL LRG LVL3 (GOWN DISPOSABLE) ×2 IMPLANT
GOWN STRL REUS W/ TWL XL LVL3 (GOWN DISPOSABLE) ×2 IMPLANT
GOWN STRL REUS W/TWL LRG LVL3 (GOWN DISPOSABLE) ×2
GOWN STRL REUS W/TWL XL LVL3 (GOWN DISPOSABLE) ×2
IV NS 1000ML (IV SOLUTION) ×2
IV NS 1000ML BAXH (IV SOLUTION) ×2 IMPLANT
KIT BASIN OR (CUSTOM PROCEDURE TRAY) ×3 IMPLANT
KIT TURNOVER KIT B (KITS) ×3 IMPLANT
NDL HYPO 25GX1X1/2 BEV (NEEDLE) ×4 IMPLANT
NEEDLE HYPO 25GX1X1/2 BEV (NEEDLE) ×4 IMPLANT
NS IRRIG 1000ML POUR BTL (IV SOLUTION) ×3 IMPLANT
PAD ARMBOARD 7.5X6 YLW CONV (MISCELLANEOUS) ×3 IMPLANT
POSITIONER HEAD DONUT 9IN (MISCELLANEOUS) ×3 IMPLANT
SLEEVE IRRIGATION ELITE 7 (MISCELLANEOUS) ×3 IMPLANT
SPONGE SURGIFOAM ABS GEL 100 (HEMOSTASIS) ×1 IMPLANT
SPONGE SURGIFOAM ABS GEL 12-7 (HEMOSTASIS) ×1 IMPLANT
SUT CHROMIC 3 0 PS 2 (SUTURE) ×3 IMPLANT
SYR CONTROL 10ML LL (SYRINGE) ×3 IMPLANT
TRAY ENT MC OR (CUSTOM PROCEDURE TRAY) ×3 IMPLANT
TUBING IRRIGATION (MISCELLANEOUS) ×3 IMPLANT
YANKAUER SUCT BULB TIP NO VENT (SUCTIONS) ×3 IMPLANT

## 2022-02-20 NOTE — Progress Notes (Addendum)
Patient ID: Colin Walton, male   DOB: 04/12/1971, 51 y.o.   MRN: [id number]     Advanced Heart Failure Rounding Note  PCP-Cardiologist: Verne Carrow, MD   Subjective:   LVAD work up initiated.  2/23 - RHC/LHC on milrinone 0.125 mcg + Norepi 2 mcg.  -  Prox RCA to Mid RCA lesion is 80% stenosed.   1st Mrg lesion is 90% stenosed.   Prox LAD to Mid LAD lesion is 40% stenosed.   1st Diag lesion is 90% stenosed.   Ao = 107/60 (81) LV = 112/21 RA = 12 RV = 64/20 PA = 68/25 (44) PCW = 25  Fick cardiac output/index = 4.6/2.2 PVR = 4.1 WU Ao sat = 94% PA sat =68%, 67% PAPi = 3.6The left ventricular ejection fraction is less than 25% by visual estimate.  Yesterday farxiga stopped.   S/P Teeth extraction today (10)      Remains on milrinone 0.25 mcg. Co-ox 69%.   Creatinine trending down 1.4 >1.25   Complaining of mouth pain.     Objective:   Weight Range: 105.4 kg Body mass index is 36.39 kg/m.   Vital Signs:   Temp:  [97.7 F (36.5 C)-98.3 F (36.8 C)] 98.3 F (36.8 C) (03/01 0400) Pulse Rate:  [81-107] 84 (03/01 0700) Resp:  [12-27] 19 (03/01 0700) BP: (102-154)/(42-95) 130/67 (03/01 0700) SpO2:  [88 %-96 %] 94 % (03/01 0700) Weight:  [105.4 kg] 105.4 kg (03/01 0500) Last BM Date : 02/18/22  Weight change: Filed Weights   02/18/22 0500 02/04/2022 0600 2022/03/18 0500  Weight: 102.9 kg 103.1 kg 105.4 kg    Intake/Output:   Intake/Output Summary (Last 24 hours) at March 18, 2022 0756 Last data filed at 03/18/2022 0600 Gross per 24 hour  Intake 1684.82 ml  Output 1975 ml  Net -290.18 ml    Physical Exam   General:   No resp difficulty HEENT: normal. Ice packs in place.  Neck: supple. no JVD. Carotids 2+ bilat; no bruits. No lymphadenopathy or thryomegaly appreciated. Cor: PMI nondisplaced. Regular rate & rhythm. No rubs, gallops or murmurs. Lungs: clear Abdomen: soft, nontender, nondistended. No hepatosplenomegaly. No bruits or masses. Good bowel  sounds. Extremities: no cyanosis, clubbing, rash, edema. RUE PICC  Neuro: alert & orientedx3, cranial nerves grossly intact. moves all 4 extremities w/o difficulty. Affect pleasant  Telemetry   SR-ST 80-100s   Labs    CBC Recent Labs    01/30/2022 0316 03/18/2022 0423  WBC 9.2 8.3  HGB 16.3 16.7  HCT 50.2 51.7  MCV 90.8 92.0  PLT 270 251   Basic Metabolic Panel Recent Labs    98/33/82 0316 Mar 18, 2022 0423  NA 135 134*  K 4.6 4.6  CL 94* 96*  CO2 33* 31  GLUCOSE 202* 238*  BUN 38* 34*  CREATININE 1.42* 1.25*  CALCIUM 9.4 9.5   Liver Function Tests No results for input(s): AST, ALT, ALKPHOS, BILITOT, PROT, ALBUMIN in the last 72 hours.   No results for input(s): LIPASE, AMYLASE in the last 72 hours. Cardiac Enzymes No results for input(s): CKTOTAL, CKMB, CKMBINDEX, TROPONINI in the last 72 hours.  BNP: BNP (last 3 results) Recent Labs    03/22/21 1106 02/15/2022 1649 02/11/22 0345  BNP 995.8* 1,303.2* 166.8*    ProBNP (last 3 results) No results for input(s): PROBNP in the last 8760 hours.   D-Dimer No results for input(s): DDIMER in the last 72 hours. Hemoglobin A1C No results for input(s): HGBA1C in the last 72  hours.  Fasting Lipid Panel No results for input(s): CHOL, HDL, LDLCALC, TRIG, CHOLHDL, LDLDIRECT in the last 72 hours.  Thyroid Function Tests No results for input(s): TSH, T4TOTAL, T3FREE, THYROIDAB in the last 72 hours.  Invalid input(s): FREET3   Other results:   Imaging    No results found.   Medications:     Scheduled Medications:  (feeding supplement) PROSource Plus  30 mL Oral BID BM   allopurinol  300 mg Oral Daily   aspirin EC  81 mg Oral Daily   atorvastatin  80 mg Oral Daily   chlorhexidine  15 mL Mouth Rinse BID   Chlorhexidine Gluconate Cloth  6 each Topical Daily   digoxin  0.125 mg Oral Daily   lactose free nutrition  237 mL Oral TID WC   melatonin  3 mg Oral QHS   multivitamin with minerals  1 tablet Oral  Daily   sodium chloride flush  10-40 mL Intracatheter Q12H   sodium chloride flush  3 mL Intravenous Q12H    Infusions:  sodium chloride      ceFAZolin (ANCEF) IV     clindamycin (CLEOCIN) IV     milrinone 0.25 mcg/kg/min (02/21/2022 0600)   norepinephrine (LEVOPHED) Adult infusion 1 mcg/min (03/19/2022 0600)    PRN Medications: sodium chloride, acetaminophen, guaiFENesin-dextromethorphan, ondansetron (ZOFRAN) IV, sodium chloride flush, sodium chloride flush   Assessment/Plan   1. Acute on Chronic Biventricular Heart Failure  - due to NICM - suspect due to HTN and OSA +/- ETOH - cath 03/2013 no CAD - Echo 11/20 EF 30-35% - Echo 4/21 EF 20-25% moderate RV dysfunction (worse since previous) - Personally reviewed - TEE on 04/18/20. Procedure c/b by severe respiratory distress and laryngospasm. LVEF 25% RV moderately HK. Aortic valve not interrogated completely due to respiratory distress. AI appeared to be moderate.  - cMRI 8/21 EF 24% Moderate AI. No LGE or infiltrative process. - Echo 03/22/21 EF 25-30% mod AI Moderate RV dysfunction.  - Echo 18-Feb-2022 EF < 20% with mild LV dilation, RV severely HK with moderate dilation and D-shaped septum, AI moderate to severe.  - Suspect CM related to HTN +/- ETOH. AI may be contributing but severity of biventricular dysfunction out of proportion to AI and LV not overly dilated. - [facility] 01/27/2022 3v CAD/ RHC with elevated filling pressures.  - On milrinone 0.25.  Co-ox 69%.   - Volume status stable. Continue to hold diuretics and farxiga.  - Continue digoxin 0.125 mg daily. Dig level < 0.2  - RV moderate to severe HK on echo but PAPI ok  - Ideally will need transplant but is still smoking and using THC. UDS negative. Will plan VAD this admit + AVR +/- graft to RCA - VAD schedule for Friday.   - S/P Teeth extractions today.       2. Moderate-severe aortic regurgitation - Visually mild to moderate on TEE 4/21 but P1/2 time 215 msec suggesting severe AI.   - TEE on 04/18/20. Procedure c/b by severe respiratory distress and laryngospasm. LVEF 25% RV moderately HK. Aortic valve not interrogated completely due to respiratory distress. AI appeared to be moderate - TEE reviewed with echo team felt to be moderate at worst. - Mild AI on echo 3/22. - Moderate to severe AI on echo 02-18-22.  Suspect AI is not cause of cardiomyopathy but is likely making symptoms worse.  - Plan AVR at time of VAD  3. CAD - cath 3v CAD LAD 40% D1 90%,  OM 90% mRCA 80% - plan graft to RCA at time of VAD for RV support - No chest pain.  - Continue ASA and atorvastatin 80 mg daily.    4. OSA on CPAP - Fully compliant w/ CPAP.  - Follows with Dr. Mayford Knife.   5. Obesity. -Body mass index is 36.39 kg/m.   6. Tobacco use/ETOH - Says he stopped drinking - Continues to smoke, encouraged to quit.    7. ? Fatty Liver - Reports recent diagnosis at [facility], records not available.  8. AKI  - Off diurectics/farxiga.  - Creatinine back down to 1.25. - BMET in am.    9. Polyarticular Gout - Completed prednisone burst.  - Uric acid  6 but improved with steroids  10. Nonsustained VT - Stable - Keep K> 4.0 Mg > 2.0  11. PSVT - follow  12. Malnutrition - Prealbumin 9.5 -Dietitian appreciated.    Length of Stay: 12  Tonye Becket, NP  03/19/2022, 7:56 AM  Advanced Heart Failure Team Pager [phone] (M-F; 7a - 5p)  Please contact [facility] for night-coverage after hours (5p -7a ) and weekends on [email or web address]   Agree with above.   Just back from teeth extraction. Feels ok. Mouth sore.   Remains on NE/milrinone. Co-ox and volume status ok. Diuretics and torsemide on hold due to AKI. Received gentle IVF yesterday. Weigh creeping up a bit.   Denies CP or SOB. Rhythm stable.  General:  Sitting in bed No resp difficulty HEENT: normal x mouth swollen + ice packs Neck: supple. JVP 7-8 Carotids 2+ bilat; no bruits. No lymphadenopathy or thryomegaly  appreciated. Cor: PMI nondisplaced. Regular rate & rhythm. 2/6 AS/AI Lungs: clear Abdomen: soft, nontender, nondistended. No hepatosplenomegaly. No bruits or masses. Good bowel sounds. Extremities: no cyanosis, clubbing, rash, edema Neuro: alert & orientedx3, cranial nerves grossly intact. moves all 4 extremities w/o difficulty. Affect pleasant  Now s/p teeth extraction. Continue NE/milrinone. Co-ox 69%. Likely will need to restart diuretics soon. Plan VAD, AVR, CABG Friday.   CRITICAL CARE Performed by: Arvilla Meres  Total critical care time: 35 minutes  Critical care time was exclusive of separately billable procedures and treating other patients.  Critical care was necessary to treat or prevent imminent or life-threatening deterioration.  Critical care was time spent personally by me (independent of midlevel providers or residents) on the following activities: development of treatment plan with patient and/or surrogate as well as nursing, discussions with consultants, evaluation of patient's response to treatment, examination of patient, obtaining history from patient or surrogate, ordering and performing treatments and interventions, ordering and review of laboratory studies, ordering and review of radiographic studies, pulse oximetry and re-evaluation of patient's condition.  Arvilla Meres, MD  12:10 PM    .

## 2022-02-20 NOTE — H&P (Signed)
H&P documentation  -History and Physical Reviewed  -Patient has been re-examined  -No change in the plan of care  Colin Walton  

## 2022-02-20 NOTE — Op Note (Addendum)
NAME: Colin Walton, Colin Walton ?MEDICAL RECORD NO: [id number] ?ACCOUNT NO: [id number] ?DATE OF BIRTH: [birth year] ?FACILITY: [facility] ?LOCATION: [facility] ?PHYSICIAN: Georgia Lopes, DDS ? ?Operative Report  ? ?DATE OF PROCEDURE: 02/25/2022 ? 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 ? 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 ? ?PROCEDURE:  Extraction of teeth numbers 2, 3, 4, 15, 19, 23, 26, 29, 30 and 31. ? ?SURGEON:  Georgia Lopes, DDS. ? ?ANESTHESIA AND ANESTHESIOLOGIST:  General nasal intubation, Dr. Donavan Foil, attending. ? ?INDICATIONS FOR PROCEDURE:  The patient is a 51 year old male, who has congestive heart failure, hypertension, obstructive sleep apnea and COPD, currently admitted for cardiogenic shock, exacerbation of  CHF and is anticipating potential LVAD implant placement in next several days as part of his  ?preoperative workup was recommended to remove multiple nonrestorable teeth to prevent complications of heart surgery. ? ?DESCRIPTION OF PROCEDURE:  The patient was taken to the operating room and placed on the table in supine position.  General anesthesia was administered and nasal endotracheal tube was placed and secured.  The eyes were protected.  The patient was draped  ?for surgery.  Timeout was performed.  The posterior pharynx was suctioned and a throat pack was placed.  A 0.5% Marcaine, 1:200,000 epinephrine was infiltrated in an inferior alveolar block on the right and left sides and in buccal and lingual  ?infiltration in the anterior mandible around teeth number 23 and 26 and then local anesthesia was administered in the maxilla, palatally, and buccally around the teeth to be removed.  Bite block was placed on the right side of the mouth.  Sweetheart was  ?used to retract the tongue.  A #15  blade was used to make an incision around tooth number 15 in the maxilla and around tooth number 19 in the mandible.  The periosteum was reflected from around these teeth.  The teeth were elevated and removed with the  ?dental forceps.  The sockets were curetted, irrigated.  Gelfoam sponge was placed and then the area was closed with 3-0 chromic.  In the anterior mandible, a 15 blade was used to make an incision around teeth numbers 23 and 26.  The teeth were elevated  ?and then removed with the dental forceps.  The socket was curetted, irrigated.  Gelfoam sponge was placed and then these areas were closed with 3-0 chromic.  The bite block and sweetheart retractor were repositioned to the other side of the mouth.  A 15  ?blade was used to make an incision around teeth numbers 29 and 30.  Tooth number 31 was reevaluated and it was determined that there was enough decay and that there was going to be problems in the near future and as was discussed with the patient prior  ?to surgery, additional extractions may be indicated and the patient was going to have teeth numbers 2 and 31 removed considered nonrestorable at the time of surgery.  An incision was then made around tooth numbers 31, 2, 3 and 4 in the maxilla.  The  ?periosteum was reflected.  Teeth numbers 29 and 30 were removed with forceps and rongeurs.  Teeth numbers 3 and 4 were removed with the 301 elevator and rongeurs.  Tooth number 31 required sectioning and multiple bone removal as the roots were deeply  ?embedded  in the bone.  Tooth number 2 also required sectioning and bone removal to remove the roots.  When the teeth were completely removed, the sockets were curetted, irrigated.  Gelfoam sponges were placed and the areas were closed with 3-0 chromic.   ?Then, the oral cavity was irrigated and suctioned and throat pack was removed.  The patient was left under care of anesthesia for extubation and transport to the ICU for postoperative care. ? ?ESTIMATED  BLOOD LOSS:  Minimal. ? ?COMPLICATIONS:  None. ? ?SPECIMENS:  There were no specimens. ? ? ?[facility] ?D: 03-19-22 10:12:00 am T: 03/19/2022 11:16:00 am  ?JOB: [id number]/ [id number]  ?

## 2022-02-20 NOTE — Anesthesia Postprocedure Evaluation (Signed)
Anesthesia Post Note ? ?Patient: Colin Walton ? ?Procedure(s) Performed: MULTIPLE EXTRACTION WITH ALVEOLOPLASTY ? ?  ? ?Patient location during evaluation: PACU ?Anesthesia Type: General ?Level of consciousness: awake ?Pain management: pain level controlled ?Vital Signs Assessment: post-procedure vital signs reviewed and stable ?Respiratory status: spontaneous breathing and respiratory function stable ?Cardiovascular status: stable ?Postop Assessment: no apparent nausea or vomiting ?Anesthetic complications: no ? ? ?No notable events documented. ? ?Last Vitals:  ?Vitals:  ? 02/28/2022 1145 03/08/2022 1200  ?BP:  (!) 151/78  ?Pulse:  95  ?Resp:  14  ?Temp: 37.1 ?C   ?SpO2:  94%  ?  ?Last Pain:  ?Vitals:  ? 03/19/2022 1219  ?TempSrc:   ?PainSc: 8   ? ? ?  ?  ?  ?  ?  ?  ? ?[name] ? ? ? ? ?

## 2022-02-20 NOTE — Brief Op Note (Signed)
03/15/2022 ? ?10:05 AM ? ?PATIENT:  Colin Walton  51 y.o. male ? ?PRE-OPERATIVE DIAGNOSIS:  Non-restorable teeth # 3, 4, 15, 19, 23, 26, 29, 30,  secondary to dental caries and periodontitis. ? ?POST-OPERATIVE DIAGNOSIS:  SAME + non-restorable teeth # 2, 31. ? ?PROCEDURE:  Procedure(s): ?MULTIPLE EXTRACTION TEETH # 2, 3, 4, 15, 19, 23, 26, 29, 30, 31 ? ?SURGEON:  Surgeon(s): ?Ocie Doyne, DMD ? ?ANESTHESIA:   local and general ? ?EBL:  minimal ? ?DRAINS: none  ? ?SPECIMEN:  No Specimen ? ?COUNTS:  YES ? ?PATIENT DISPOSITION:  Return To Cardiac 2H room ?  ?PROCEDURE DETAILS: ?Dictation # [id number] ? ?Georgia Lopes, DMD ?03/17/2022 ?10:05 AM ? ? ? ? ? ? ? ? ? ? ? ? ? ? ? ?  ?

## 2022-02-20 NOTE — Transfer of Care (Signed)
Immediate Anesthesia Transfer of Care Note ? ?Patient: Colin Walton ? ?Procedure(s) Performed: MULTIPLE EXTRACTION WITH ALVEOLOPLASTY ? ?Patient Location: ICU ? ?Anesthesia Type:General ? ?Level of Consciousness: awake, alert  and oriented ? ?Airway & Oxygen Therapy: Patient Spontanous Breathing and Patient connected to face mask oxygen ? ?Post-op Assessment: Report given to RN and Post -op Vital signs reviewed and stable ? ?Post vital signs: Reviewed and stable ? ?Last Vitals:  ?Vitals Value Taken Time  ?BP    ?Temp    ?Pulse 105 03/10/2022 1032  ?Resp 16 03/14/2022 1032  ?SpO2 94 % 03/20/2022 1032  ?Vitals shown include unvalidated device data. ? ?Last Pain:  ?Vitals:  ? 03/16/2022 0400  ?TempSrc: Oral  ?PainSc: Asleep  ?   ? ?Patients Stated Pain Goal: 0 (02/17/22 2000) ? ?Complications: No notable events documented. ?

## 2022-02-20 NOTE — Anesthesia Procedure Notes (Signed)
Procedure Name: Intubation ?Date/Time: 02/28/2022 9:00 AM ?Performed by: Minerva Ends, CRNA ?Pre-anesthesia Checklist: Patient identified, Emergency Drugs available, Suction available and Patient being monitored ?Patient Re-evaluated:Patient Re-evaluated prior to induction ?Oxygen Delivery Method: Circle system utilized ?Preoxygenation: Pre-oxygenation with 100% oxygen ?Induction Type: IV induction ?Ventilation: Mask ventilation without difficulty ?Laryngoscope Size: Mac and 3 ?Grade View: Grade II ?Nasal Tubes: Right, Nasal prep performed, Nasal Rae and Magill forceps- large, utilized ?Tube size: 7.0 mm ?Number of attempts: 1 ?Placement Confirmation: ETT inserted through vocal cords under direct vision, positive ETCO2 and breath sounds checked- equal and bilateral ?Tube secured with: Tape ?Dental Injury: Teeth and Oropharynx as per pre-operative assessment  ? ? ? ? ?

## 2022-02-20 DEATH — deceased

## 2022-02-21 ENCOUNTER — Encounter (HOSPITAL_COMMUNITY): Payer: Self-pay | Admitting: Oral Surgery

## 2022-02-21 DIAGNOSIS — I509 Heart failure, unspecified: Secondary | ICD-10-CM

## 2022-02-21 LAB — HEMOGLOBIN A1C
Hgb A1c MFr Bld: 6 % — ABNORMAL HIGH (ref 4.8–5.6)
Mean Plasma Glucose: 125.5 mg/dL

## 2022-02-21 LAB — CBC
HCT: 52.8 % — ABNORMAL HIGH (ref 39.0–52.0)
Hemoglobin: 16.2 g/dL (ref 13.0–17.0)
MCH: 29 pg (ref 26.0–34.0)
MCHC: 30.7 g/dL (ref 30.0–36.0)
MCV: 94.5 fL (ref 80.0–100.0)
Platelets: 243 10*3/uL (ref 150–400)
RBC: 5.59 MIL/uL (ref 4.22–5.81)
RDW: 14.2 % (ref 11.5–15.5)
WBC: 7.1 10*3/uL (ref 4.0–10.5)
nRBC: 0 % (ref 0.0–0.2)

## 2022-02-21 LAB — COOXEMETRY PANEL
Carboxyhemoglobin: 1.7 % — ABNORMAL HIGH (ref 0.5–1.5)
Methemoglobin: 0.8 % (ref 0.0–1.5)
O2 Saturation: 71 %
Total hemoglobin: 16.9 g/dL — ABNORMAL HIGH (ref 12.0–16.0)

## 2022-02-21 LAB — GLUCOSE, CAPILLARY
Glucose-Capillary: 98 mg/dL (ref 70–99)
Glucose-Capillary: 178 mg/dL — ABNORMAL HIGH (ref 70–99)
Glucose-Capillary: 115 mg/dL — ABNORMAL HIGH (ref 70–99)
Glucose-Capillary: 123 mg/dL — ABNORMAL HIGH (ref 70–99)

## 2022-02-21 LAB — BASIC METABOLIC PANEL
Anion gap: 8 (ref 5–15)
BUN: 22 mg/dL — ABNORMAL HIGH (ref 6–20)
CO2: 30 mmol/L (ref 22–32)
Calcium: 9.3 mg/dL (ref 8.9–10.3)
Chloride: 99 mmol/L (ref 98–111)
Creatinine, Ser: 1.09 mg/dL (ref 0.61–1.24)
GFR, Estimated: >60 mL/min (ref >60)
Glucose, Bld: 174 mg/dL — ABNORMAL HIGH (ref 70–99)
Potassium: 5.1 mmol/L (ref 3.5–5.1)
Sodium: 137 mmol/L (ref 135–145)

## 2022-02-21 LAB — MRSA NEXT GEN BY PCR, NASAL: MRSA by PCR Next Gen: NOT DETECTED

## 2022-02-21 MED ORDER — ENOXAPARIN SODIUM 60 MG/0.6ML IJ SOSY
50.0000 mg | PREFILLED_SYRINGE | Freq: Every day | INTRAMUSCULAR | Status: AC
  Administered 2022-02-21: 50 mg via SUBCUTANEOUS
  Filled 2022-02-21 (×2): qty 0.5

## 2022-02-21 MED ORDER — HYDRALAZINE HCL 20 MG/ML IJ SOLN: 10.0000 mg | INTRAMUSCULAR | Status: DC | PRN

## 2022-02-21 MED ORDER — VANCOMYCIN HCL 1500 MG/300ML IV SOLN
1500.0000 mg | INTRAVENOUS | Status: AC
  Administered 2022-02-22: 1500 mg via INTRAVENOUS
  Filled 2022-02-21: qty 300

## 2022-02-21 MED ORDER — DIAZEPAM 5 MG PO TABS
5.0000 mg | ORAL_TABLET | Freq: Once | ORAL | Status: AC
  Administered 2022-02-22: 5 mg via ORAL
  Filled 2022-02-21: qty 1

## 2022-02-21 MED ORDER — FLUCONAZOLE IN SODIUM CHLORIDE 400-0.9 MG/200ML-% IV SOLN
400.0000 mg | INTRAVENOUS | Status: AC
  Administered 2022-02-22: 400 mg via INTRAVENOUS
  Filled 2022-02-21: qty 200

## 2022-02-21 MED ORDER — CHLORHEXIDINE GLUCONATE CLOTH 2 % EX PADS
6.0000 | MEDICATED_PAD | Freq: Once | CUTANEOUS | Status: AC
  Administered 2022-02-21: 6 via TOPICAL

## 2022-02-21 MED ORDER — EPINEPHRINE HCL 5 MG/250ML IV SOLN IN NS
0.0000 ug/min | INTRAVENOUS | Status: AC
  Administered 2022-02-22: 1 ug/min via INTRAVENOUS
  Filled 2022-02-21: qty 250

## 2022-02-21 MED ORDER — MANNITOL 20 % IV SOLN
INTRAVENOUS | Status: DC
  Filled 2022-02-21: qty 13

## 2022-02-21 MED ORDER — TRANEXAMIC ACID 1000 MG/10ML IV SOLN
1.5000 mg/kg/h | INTRAVENOUS | Status: AC
  Administered 2022-02-22: 1.5 mg/kg/h via INTRAVENOUS
  Filled 2022-02-21: qty 25

## 2022-02-21 MED ORDER — HEPARIN 30,000 UNITS/1000 ML (OHS) CELLSAVER SOLUTION
Status: DC
  Filled 2022-02-21: qty 1000

## 2022-02-21 MED ORDER — MANNITOL 20 % IV SOLN
Freq: Once | INTRAVENOUS | Status: DC
  Filled 2022-02-21: qty 13

## 2022-02-21 MED ORDER — POTASSIUM CHLORIDE CRYS ER 20 MEQ PO TBCR
20.0000 meq | EXTENDED_RELEASE_TABLET | Freq: Once | ORAL | Status: AC
  Administered 2022-02-21: 20 meq via ORAL
  Filled 2022-02-21: qty 1

## 2022-02-21 MED ORDER — TRANEXAMIC ACID (OHS) BOLUS VIA INFUSION
15.0000 mg/kg | INTRAVENOUS | Status: AC
  Administered 2022-02-22: 991.5 mg via INTRAVENOUS
  Filled 2022-02-21: qty 992

## 2022-02-21 MED ORDER — PHENYLEPHRINE HCL-NACL 20-0.9 MG/250ML-% IV SOLN
0.0000 ug/min | INTRAVENOUS | Status: DC
  Filled 2022-02-21: qty 250

## 2022-02-21 MED ORDER — POTASSIUM CHLORIDE 2 MEQ/ML IV SOLN
80.0000 meq | INTRAVENOUS | Status: DC
Start: 2022-02-22 — End: 2022-02-22
  Filled 2022-02-21: qty 40

## 2022-02-21 MED ORDER — NITROGLYCERIN IN D5W 200-5 MCG/ML-% IV SOLN
0.0000 ug/min | INTRAVENOUS | Status: DC
  Filled 2022-02-21: qty 250

## 2022-02-21 MED ORDER — MUPIROCIN 2 % EX OINT
1.0000 "application " | TOPICAL_OINTMENT | Freq: Two times a day (BID) | CUTANEOUS | Status: DC
  Administered 2022-02-21: 1 via NASAL
  Filled 2022-02-21: qty 22

## 2022-02-21 MED ORDER — MILRINONE LACTATE IN DEXTROSE 20-5 MG/100ML-% IV SOLN
0.3750 ug/kg/min | INTRAVENOUS | Status: DC
  Administered 2022-02-21 (×2): 0.25 ug/kg/min via INTRAVENOUS
  Administered 2022-02-22: 0.5 ug/kg/min via INTRAVENOUS
  Filled 2022-02-21 (×4): qty 100

## 2022-02-21 MED ORDER — FUROSEMIDE 10 MG/ML IJ SOLN
40.0000 mg | Freq: Once | INTRAMUSCULAR | Status: AC
  Administered 2022-02-21: 40 mg via INTRAVENOUS
  Filled 2022-02-21: qty 4

## 2022-02-21 MED ORDER — INSULIN REGULAR(HUMAN) IN NACL 100-0.9 UT/100ML-% IV SOLN
INTRAVENOUS | Status: AC
  Administered 2022-02-22: .6 [IU]/h via INTRAVENOUS
  Filled 2022-02-21: qty 100

## 2022-02-21 MED ORDER — VANCOMYCIN HCL 1 G IV SOLR
1000.0000 mg | INTRAVENOUS | Status: DC
  Filled 2022-02-21: qty 20

## 2022-02-21 MED ORDER — TEMAZEPAM 7.5 MG PO CAPS: 15.0000 mg | ORAL_CAPSULE | Freq: Once | ORAL | Status: DC | PRN

## 2022-02-21 MED ORDER — BISACODYL 5 MG PO TBEC
5.0000 mg | DELAYED_RELEASE_TABLET | Freq: Once | ORAL | Status: AC
  Administered 2022-02-21: 5 mg via ORAL
  Filled 2022-02-21: qty 1

## 2022-02-21 MED ORDER — CEFAZOLIN SODIUM-DEXTROSE 2-4 GM/100ML-% IV SOLN
2.0000 g | INTRAVENOUS | Status: AC
  Administered 2022-02-22: 2 g via INTRAVENOUS
  Filled 2022-02-21: qty 100

## 2022-02-21 MED ORDER — CHLORHEXIDINE GLUCONATE 0.12 % MT SOLN
15.0000 mL | Freq: Once | OROMUCOSAL | Status: AC
  Administered 2022-02-22: 15 mL via OROMUCOSAL

## 2022-02-21 MED ORDER — TRANEXAMIC ACID (OHS) PUMP PRIME SOLUTION
2.0000 mg/kg | INTRAVENOUS | Status: DC
  Filled 2022-02-21: qty 2.11

## 2022-02-21 MED ORDER — SODIUM CHLORIDE 0.9 % IV SOLN
600.0000 mg | INTRAVENOUS | Status: AC
  Administered 2022-02-22: 600 mg via INTRAVENOUS
  Filled 2022-02-21: qty 10

## 2022-02-21 MED ORDER — DOBUTAMINE IN D5W 4-5 MG/ML-% IV SOLN
2.0000 ug/kg/min | INTRAVENOUS | Status: DC
  Filled 2022-02-21: qty 250

## 2022-02-21 MED ORDER — DEXMEDETOMIDINE HCL IN NACL 400 MCG/100ML IV SOLN
0.1000 ug/kg/h | INTRAVENOUS | Status: AC
  Administered 2022-02-22: .5 ug/kg/h via INTRAVENOUS
  Filled 2022-02-21: qty 100

## 2022-02-21 MED ORDER — MILRINONE LACTATE IN DEXTROSE 20-5 MG/100ML-% IV SOLN
0.3000 ug/kg/min | INTRAVENOUS | Status: AC
  Administered 2022-02-22: .5 ug/kg/min via INTRAVENOUS
  Administered 2022-02-22: .25 ug/kg/min via INTRAVENOUS
  Filled 2022-02-21: qty 100

## 2022-02-21 MED ORDER — DOPAMINE-DEXTROSE 3.2-5 MG/ML-% IV SOLN
0.0000 ug/kg/min | INTRAVENOUS | Status: DC
  Filled 2022-02-21: qty 250

## 2022-02-21 MED ORDER — VASOPRESSIN 20 UNITS/100 ML INFUSION FOR SHOCK
0.0400 [IU]/min | INTRAVENOUS | Status: DC
Start: 2022-02-22 — End: 2022-02-22
  Filled 2022-02-21: qty 100

## 2022-02-21 MED ORDER — NOREPINEPHRINE 4 MG/250ML-% IV SOLN
0.0000 ug/min | INTRAVENOUS | Status: AC
  Administered 2022-02-22: 1 ug/min via INTRAVENOUS
  Filled 2022-02-21: qty 250

## 2022-02-21 NOTE — Progress Notes (Addendum)
VAD coordinator rounding note: ? ?Met with patient, his mother, and his youngest son at bedside. Dental extractions completed yesterday.  ? ?All questions answered regarding the plan for tomorrow. Pt's mother will plan to be at the hospital prior to patient going to OR, then she will go home for most of the day. VAD coordinator will call [phone] to give updates. She will provide updates to the rest of his family.  ? ?Discussed importance of using IS pre and post OP to prevent PNA with surgery. He reports he is using regularly.  ? ?Completed Pre-implant Intermacs follow up completed including:  Quality of Life, KCCQ-12, and Neurocognitive trail making.  ? ?Pt completed 872 feet during 6 minute walk with PT on 02/15/22. ? ?Pre VAD: ? ?[place]  ?KCCQ-12    ?1 a. Ability to shower/bathe Moderately limited   ?1 b. Ability to walk 1 block Moderately limited   ?1 c. Ability to hurry/jog Extremely limited   ?2. Edema feet/ankles/legs 1-2 times per week   ?3. Limited by fatigue Less than once a week   ?4. Limited by dyspnea 1-2 times per week   ?5. Sitting up / on 3+ pillows Never over the past 2 weeks   ?6. Limited enjoyment of life Slightly limited   ?7. Rest of life w/ symptoms Not at all satisfied   ?8 a. Participation in hobbies Did not limit at all   ?8 b. Participation in chores Moderately limited   ?8 c. Visiting family/friends Slightly limited   ? ?Pt Goal: ?Pt hopes his quality of life will be improved with VAD. Once recovered from surgery he would like to go back to work.  ? ?Discussed daily drive line dressing change weekend coverage with 2H RN Nurse Davonna Belling, who agreed to complete changes on Saturday and Sunday.  ? ?Emerson Monte RN ?VAD Coordinator  ?Office: [phone]  ?24/7 Pager: [phone]  ? ? ? ? ?

## 2022-02-21 NOTE — H&P (View-Only) (Signed)
1 Day Post-Op Procedure(s) (LRB): ?MULTIPLE EXTRACTION WITH ALVEOLOPLASTY (N/A) ?Subjective: ?Feels well. Mouth still a little swollen but pain under control and taking liquids. No significant bleeding. ? ?Objective: ?Vital signs in last 24 hours: ?Temp:  [97.9 ?F (36.6 ?C)-98.3 ?F (36.8 ?C)] 98.3 ?F (36.8 ?C) (03/02 1627) ?Pulse Rate:  [88-110] 104 (03/02 1500) ?Cardiac Rhythm: Normal sinus rhythm (03/02 0800) ?Resp:  [9-22] 14 (03/02 1500) ?BP: (127-163)/(56-84) 152/81 (03/02 1500) ?SpO2:  [91 %-98 %] 95 % (03/02 1500) ?Weight:  [105.7 kg] 105.7 kg (03/02 0600) ? ?Hemodynamic parameters for last 24 hours: ?CVP:  [6 mmHg-12 mmHg] 6 mmHg ? ?Intake/Output from previous day: ?03/01 0701 - 03/02 0700 ?In: 439 [P.O.:240; I.V.:199] ?Out: 1225 [Urine:1125; Blood:100] ?Intake/Output this shift: ?Total I/O ?In: 530.6 [P.O.:480; I.V.:50.6] ?Out: 1450 [Urine:1450] ? ?General appearance: alert and cooperative ?Neurologic: intact ?Heart: regular rate and rhythm ?Lungs: clear to auscultation bilaterally ?Extremities: extremities normal, atraumatic, no cyanosis or edema ?Mouth looks ok. No bleeding. ? ?Lab Results: ?Recent Labs  ?  03/17/2022 ?0423 02/21/22 ?0413  ?WBC 8.3 7.1  ?HGB 16.7 16.2  ?HCT 51.7 52.8*  ?PLT 251 243  ? ?BMET:  ?Recent Labs  ?  03/15/2022 ?0423 02/21/22 ?0413  ?NA 134* 137  ?K 4.6 5.1  ?CL 96* 99  ?CO2 31 30  ?GLUCOSE 238* 174*  ?BUN 34* 22*  ?CREATININE 1.25* 1.09  ?CALCIUM 9.5 9.3  ?  ?PT/INR: No results for input(s): LABPROT, INR in the last 72 hours. ?ABG ?   ?Component Value Date/Time  ? PHART 7.342 (L) 02/11/2022 0754  ? HCO3 23.5 02/05/2022 0758  ? HCO3 29.1 (H) 01/27/2022 0758  ? TCO2 25 02/15/2022 0758  ? TCO2 31 01/25/2022 0758  ? ACIDBASEDEF 3.0 (H) 02/13/2022 0758  ? O2SAT 71 02/21/2022 0413  ? ?CBG (last 3)  ?Recent Labs  ?  02/21/22 ?0702 02/21/22 ?1115 02/21/22 ?[id number]  ?GLUCAP 98 178* 115*  ? ? ?Assessment/Plan: ? ?I have personally reviewed his cath and TEE studies. He has acute on chronic  biventricular heart failure with an EF of less than 20% with moderate to severe RV dysfunction and moderate pulmonary HTN. He has stabilized on milrinone 0.25. His echo shows moderate to severe AI. Cath shows an 80% stenosis of the mid portion of a large dominant RCA. There is also a 90% OM stenosis. I agree that LVAD insertion as destination therapy is the best option for treating him. He will also require AVR and a bypass to the RCA to maximize RV perfusion. I discussed the operative procedure with the patient and his daughter including alternatives, benefits and risks; including but not limited to bleeding, blood transfusion, infection, stroke, myocardial infarction, graft failure, heart block requiring a permanent pacemaker, organ dysfunction, LVAD malfunction or thrombosis, GI bleeding and death.  Brevyn Guida understands and agrees to proceed.  We will schedule surgery for the am tomorrow. ? LOS: 13 days  ? ? ?Gaye Pollack ?02/21/2022 ?5:52 pm ? ?

## 2022-02-21 NOTE — Progress Notes (Signed)
Patient states will placed self on NIV for the night. Instructed patient to call nursing staff if have any issues with the CPAP. Patient is stable at this time. No complications noted.  ?

## 2022-02-21 NOTE — Progress Notes (Signed)
1 Day Post-Op Procedure(s) (LRB): ?MULTIPLE EXTRACTION WITH ALVEOLOPLASTY (N/A) ?Subjective: ?Feels well. Mouth still a little swollen but pain under control and taking liquids. No significant bleeding. ? ?Objective: ?Vital signs in last 24 hours: ?Temp:  [97.9 ?F (36.6 ?C)-98.3 ?F (36.8 ?C)] 98.3 ?F (36.8 ?C) (03/02 1627) ?Pulse Rate:  [88-110] 104 (03/02 1500) ?Cardiac Rhythm: Normal sinus rhythm (03/02 0800) ?Resp:  [9-22] 14 (03/02 1500) ?BP: (127-163)/(56-84) 152/81 (03/02 1500) ?SpO2:  [91 %-98 %] 95 % (03/02 1500) ?Weight:  [105.7 kg] 105.7 kg (03/02 0600) ? ?Hemodynamic parameters for last 24 hours: ?CVP:  [6 mmHg-12 mmHg] 6 mmHg ? ?Intake/Output from previous day: ?03/01 0701 - 03/02 0700 ?In: 439 [P.O.:240; I.V.:199] ?Out: 1225 [Urine:1125; Blood:100] ?Intake/Output this shift: ?Total I/O ?In: 530.6 [P.O.:480; I.V.:50.6] ?Out: 1450 [Urine:1450] ? ?General appearance: alert and cooperative ?Neurologic: intact ?Heart: regular rate and rhythm ?Lungs: clear to auscultation bilaterally ?Extremities: extremities normal, atraumatic, no cyanosis or edema ?Mouth looks ok. No bleeding. ? ?Lab Results: ?Recent Labs  ?  02/23/2022 ?0423 02/21/22 ?0413  ?WBC 8.3 7.1  ?HGB 16.7 16.2  ?HCT 51.7 52.8*  ?PLT 251 243  ? ?BMET:  ?Recent Labs  ?  03/15/2022 ?0423 02/21/22 ?0413  ?NA 134* 137  ?K 4.6 5.1  ?CL 96* 99  ?CO2 31 30  ?GLUCOSE 238* 174*  ?BUN 34* 22*  ?CREATININE 1.25* 1.09  ?CALCIUM 9.5 9.3  ?  ?PT/INR: No results for input(s): LABPROT, INR in the last 72 hours. ?ABG ?   ?Component Value Date/Time  ? PHART 7.342 (L) 02/12/2022 0754  ? HCO3 23.5 02/08/2022 0758  ? HCO3 29.1 (H) 02/13/2022 0758  ? TCO2 25 01/31/2022 0758  ? TCO2 31 02/07/2022 0758  ? ACIDBASEDEF 3.0 (H) 02/10/2022 0758  ? O2SAT 71 02/21/2022 0413  ? ?CBG (last 3)  ?Recent Labs  ?  02/21/22 ?0702 02/21/22 ?1115 02/21/22 ?[id number]  ?GLUCAP 98 178* 115*  ? ? ?Assessment/Plan: ? ?I have personally reviewed his cath and TEE studies. He has acute on chronic  biventricular heart failure with an EF of less than 20% with moderate to severe RV dysfunction and moderate pulmonary HTN. He has stabilized on milrinone 0.25. His echo shows moderate to severe AI. Cath shows an 80% stenosis of the mid portion of a large dominant RCA. There is also a 90% OM stenosis. I agree that LVAD insertion as destination therapy is the best option for treating him. He will also require AVR and a bypass to the RCA to maximize RV perfusion. I discussed the operative procedure with the patient and his daughter including alternatives, benefits and risks; including but not limited to bleeding, blood transfusion, infection, stroke, myocardial infarction, graft failure, heart block requiring a permanent pacemaker, organ dysfunction, LVAD malfunction or thrombosis, GI bleeding and death.  Rolin Pais understands and agrees to proceed.  We will schedule surgery for the am tomorrow. ? LOS: 13 days  ? ? ?Gaye Pollack ?02/21/2022 ?5:52 pm ? ?

## 2022-02-21 NOTE — Progress Notes (Signed)
Colin Walton PROGRESS NOTE: ? ? ?SUBJECTIVE:Some pain and bleeding ? ?OBJECTIVE: ? ?Vitals: Blood pressure 136/82, pulse 94, temperature 98 ?F (36.7 ?C), temperature source Oral, resp. rate 16, height 5' 7"  (1.702 m), weight 105.7 kg, SpO2 97 %. ?Lab results: ?Results for orders placed or performed during the hospital encounter of 02/16/2022 (from the past 24 hour(s))  ?Glucose, capillary     Status: Abnormal  ? Collection Time: 03/17/2022  5:05 PM  ?Result Value Ref Range  ? Glucose-Capillary 157 (H) 70 - 99 mg/dL  ?Glucose, capillary     Status: Abnormal  ? Collection Time: 03/12/2022  9:11 PM  ?Result Value Ref Range  ? Glucose-Capillary 142 (H) 70 - 99 mg/dL  ?Marland KitchenCooxemetry Panel (carboxy, met, total hgb, O2 sat)     Status: Abnormal  ? Collection Time: 02/21/22  4:13 AM  ?Result Value Ref Range  ? Total hemoglobin 16.9 (H) 12.0 - 16.0 g/dL  ? O2 Saturation 71 %  ? Carboxyhemoglobin 1.7 (H) 0.5 - 1.5 %  ? Methemoglobin 0.8 0.0 - 1.5 %  ?Basic metabolic panel     Status: Abnormal  ? Collection Time: 02/21/22  4:13 AM  ?Result Value Ref Range  ? Sodium 137 135 - 145 mmol/L  ? Potassium 5.1 3.5 - 5.1 mmol/L  ? Chloride 99 98 - 111 mmol/L  ? CO2 30 22 - 32 mmol/L  ? Glucose, Bld 174 (H) 70 - 99 mg/dL  ? BUN 22 (H) 6 - 20 mg/dL  ? Creatinine, Ser 1.09 0.61 - 1.24 mg/dL  ? Calcium 9.3 8.9 - 10.3 mg/dL  ? GFR, Estimated >60 >60 mL/min  ? Anion gap 8 5 - 15  ?CBC     Status: Abnormal  ? Collection Time: 02/21/22  4:13 AM  ?Result Value Ref Range  ? WBC 7.1 4.0 - 10.5 K/uL  ? RBC 5.59 4.22 - 5.81 MIL/uL  ? Hemoglobin 16.2 13.0 - 17.0 g/dL  ? HCT 52.8 (H) 39.0 - 52.0 %  ? MCV 94.5 80.0 - 100.0 fL  ? MCH 29.0 26.0 - 34.0 pg  ? MCHC 30.7 30.0 - 36.0 g/dL  ? RDW 14.2 11.5 - 15.5 %  ? Platelets 243 150 - 400 K/uL  ? nRBC 0.0 0.0 - 0.2 %  ?Hemoglobin A1c     Status: Abnormal  ? Collection Time: 02/21/22  4:13 AM  ?Result Value Ref Range  ? Hgb A1c MFr Bld 6.0 (H) 4.8 - 5.6 %  ? Mean Plasma Glucose 125.5 mg/dL  ?Glucose, capillary      Status: None  ? Collection Time: 02/21/22  7:02 AM  ?Result Value Ref Range  ? Glucose-Capillary 98 70 - 99 mg/dL  ? ?Radiology Results: No results found. ?General appearance: alert, cooperative, and no distress ?Head: Normocephalic, without obvious abnormality, atraumatic ?Eyes: negative ?Nose: Nares normal. Septum midline. Mucosa normal. No drainage or sinus tenderness. ?Throat: Minimal oozing from sockets. Sutures intact. Pharynx clear.  ?Neck: no adenopathy ? ?A/P: Stable s/p Dental extractions. Encouraged bite on gauze for bleeding instead of suction. Please contact me if further help needed.  ? ? ? ? ?Diona Browner, DMD ?[phone] ?02/21/2022  ?

## 2022-02-21 NOTE — Progress Notes (Signed)
Nutrition Follow-up ? ?DOCUMENTATION CODES:  ? ?Not applicable ? ?INTERVENTION:  ? ?Continue Dysphagia 3 diet ? ?Allow double portions at meal times ? ?Continue Boost Plus po TID, each supplement provides 360 kcal and 14 grams of protein ? ?Continue MVI with Minerals ? ?NUTRITION DIAGNOSIS:  ? ?Increased nutrient needs related to chronic illness as evidenced by estimated needs. ? ?Being addressed via double portions, supplements ? ?GOAL:  ? ?Patient will meet greater than or equal to 90% of their needs ? ?Progressing ? ?MONITOR:  ? ?PO intake, Supplement acceptance, Labs, Weight trends ? ?REASON FOR ASSESSMENT:  ? ?Consult ?LVAD Eval ? ?ASSESSMENT:  ? ?51 yo male admitted with acute on chronic biventricular heart failure with work-up for LVAD. PMH includes HTN, OSA on CPAP, tobacco use/EtOH ? ?3/01 Multiple Dental Extractions ? ?Noted plan for LVAD tomorrow ? ?Pt with post dental extraction mouth pain but tolerating soft foods; currently on Dysphagia 3 diet. Pt ate 2 bowls of grits and pudding for breakfast this AM.  ? ?Appetite has remains very good. Recorded po intake 100% of meals. Pt also drinking some Boost Plus ? ?Current wt 105.7 kg; mild edema present on exam. Weight has fluctuated this admission from 102.9 kg to 106.1 kg ?UOP 1.125 L in 24 hours ? ?Labs: Creatinine wdl, BUN 22, sodium wdl, potassium 5.1 (wdl) ?Meds: ss novolog, MVI with Minerals, lasix x 1 ? ? ?Diet Order:   ?Diet Order   ? ?       ?  DIET DYS 3 Room service appropriate? Yes; Fluid consistency: Thin  Diet effective now       ?  ? ?  ?  ? ?  ? ? ?EDUCATION NEEDS:  ? ?Education needs have been addressed ? ?Skin:  Skin Assessment: Reviewed RN Assessment ? ?Last BM:  3/1 ? ?Height:  ? ?Ht Readings from Last 1 Encounters:  ?02/17/22 5\' 7"  (1.702 m)  ? ? ?Weight:  ? ?Wt Readings from Last 1 Encounters:  ?02/21/22 105.7 kg  ? ? ?Ideal Body Weight:    ? ?BMI:  Body mass index is 36.5 kg/m?. ? ?Estimated Nutritional Needs:  ? ?Kcal:  2100-2300  kcals ? ?Protein:  110-130 g ? ?Fluid:  >/= 2 L ? ? ?04/23/22 MS, RDN, LDN, CNSC ?Registered Dietitian III ?Clinical Nutrition ?RD Pager and On-Call Pager Number Located in [place]  ? ?

## 2022-02-21 NOTE — Progress Notes (Signed)
I spoke with Misty Stanley at [facility] (through [facility]) regarding prior authorization for LVAD implant. She informed me implant has been improved, and provided me with the following authorization number: [id number].  ? ?Alyce Pagan RN ?VAD Coordinator  ?Office: [phone]  ?24/7 Pager: [phone]  ? ?

## 2022-02-21 NOTE — Progress Notes (Addendum)
Patient ID: Colin Walton, male   DOB: [birth year], 51 y.o.   MRN: [id number]     Advanced Heart Failure Rounding Note  PCP-Cardiologist: Lauree Chandler, MD   Subjective:    2/17 admitted for a/c Biventricular HF, NYHA IIIb-IV symptoms, w/ low output. Stated on milrinone. LVAD work up initiated.  2/23 RHC/LHC on milrinone 0.125 mcg + Norepi 2 mcg w/ 2v CAD, Severe NICM EF < 20%, Elevated filling pressures with markedly reduced CO with severe AI 2/28 diuretics held for AKI  3/1 Multiple teeth extractions   On Milrinone 0.25. Co-ox 71%   Wt up slightly off diuretics, up 6 lb in 2 days. CVP 10-11. Denies dyspnea. AKI resolved, SCr 1.42>>1.25>>1.09. BUN 38>>34>>22  K 5.1   Endorses post operative mouth pain. 7/10. Tolerating soft diet. Otherwise no complaints. Had BM yesterday. Sitting up on side of bed. Mother at bedside.     RHC/LHC on milrinone 0.125 mcg + Norepi 2 mcg.  -  Prox RCA to Mid RCA lesion is 80% stenosed.   1st Mrg lesion is 90% stenosed.   Prox LAD to Mid LAD lesion is 40% stenosed.   1st Diag lesion is 90% stenosed.   Ao = 107/60 (81) LV = 112/21 RA = 12 RV = 64/20 PA = 68/25 (44) PCW = 25  Fick cardiac output/index = 4.6/2.2 PVR = 4.1 WU Ao sat = 94% PA sat =68%, 67% PAPi = 3.6The left ventricular ejection fraction is less than 25% by visual estimate.    Objective:   Weight Range: 105.7 kg Body mass index is 36.5 kg/m.   Vital Signs:   Temp:  [97.9 F (36.6 C)-98.7 F (37.1 C)] 98 F (36.7 C) (03/02 0700) Pulse Rate:  [88-110] 94 (03/02 0700) Resp:  [9-22] 16 (03/02 0700) BP: (127-179)/(67-86) 136/82 (03/02 0700) SpO2:  [91 %-98 %] 97 % (03/02 0700) Weight:  [105.7 kg] 105.7 kg (03/02 0600) Last BM Date : 01/25/2022  Weight change: Filed Weights   01/28/2022 0600 03/08/2022 0500 02/21/22 0600  Weight: 103.1 kg 105.4 kg 105.7 kg    Intake/Output:   Intake/Output Summary (Last 24 hours) at 02/21/2022 0730 Last data filed at 02/21/2022  0600 Gross per 24 hour  Intake 439 ml  Output 1225 ml  Net -786 ml    Physical Exam   CVP 10-11  General:  Well appearing. No respiratory difficulty HEENT: normal Neck: supple. JVD 10 cm Carotids 2+ bilat; no bruits. No lymphadenopathy or thyromegaly appreciated. Cor: PMI nondisplaced. Regular rate & rhythm. No rubs, gallops or murmurs. Lungs: clear Abdomen: soft, nontender, nondistended. No hepatosplenomegaly. No bruits or masses. Good bowel sounds. Extremities: no cyanosis, clubbing, rash, edema + RUE PICC  Neuro: alert & oriented x 3, cranial nerves grossly intact. moves all 4 extremities w/o difficulty. Affect pleasant.   Telemetry   NSR 80s, 7 beat run of NSVT overnight, personally reviewed    Labs    CBC Recent Labs    03/12/2022 0423 02/21/22 0413  WBC 8.3 7.1  HGB 16.7 16.2  HCT 51.7 52.8*  MCV 92.0 94.5  PLT 251 0000000   Basic Metabolic Panel Recent Labs    03/19/2022 0423 02/21/22 0413  NA 134* 137  K 4.6 5.1  CL 96* 99  CO2 31 30  GLUCOSE 238* 174*  BUN 34* 22*  CREATININE 1.25* 1.09  CALCIUM 9.5 9.3   Liver Function Tests No results for input(s): AST, ALT, ALKPHOS, BILITOT, PROT, ALBUMIN in the last 72 hours.  No results for input(s): LIPASE, AMYLASE in the last 72 hours. Cardiac Enzymes No results for input(s): CKTOTAL, CKMB, CKMBINDEX, TROPONINI in the last 72 hours.  BNP: BNP (last 3 results) Recent Labs    03/22/21 1106 01/28/2022 1649 02/11/22 0345  BNP 995.8* 1,303.2* 166.8*    ProBNP (last 3 results) No results for input(s): PROBNP in the last 8760 hours.   D-Dimer No results for input(s): DDIMER in the last 72 hours. Hemoglobin A1C Recent Labs    02/21/22 0413  HGBA1C 6.0*    Fasting Lipid Panel No results for input(s): CHOL, HDL, LDLCALC, TRIG, CHOLHDL, LDLDIRECT in the last 72 hours.  Thyroid Function Tests No results for input(s): TSH, T4TOTAL, T3FREE, THYROIDAB in the last 72 hours.  Invalid input(s):  FREET3   Other results:   Imaging    No results found.   Medications:     Scheduled Medications:  (feeding supplement) PROSource Plus  30 mL Oral BID BM   allopurinol  300 mg Oral Daily   aspirin EC  81 mg Oral Daily   atorvastatin  80 mg Oral Daily   chlorhexidine  15 mL Mouth Rinse BID   Chlorhexidine Gluconate Cloth  6 each Topical Daily   digoxin  0.125 mg Oral Daily   insulin aspart  0-9 Units Subcutaneous TID WC   lactose free nutrition  237 mL Oral TID WC   melatonin  3 mg Oral QHS   multivitamin with minerals  1 tablet Oral Daily   sodium chloride flush  10-40 mL Intracatheter Q12H   sodium chloride flush  3 mL Intravenous Q12H    Infusions:  sodium chloride      PRN Medications: sodium chloride, acetaminophen, guaiFENesin-dextromethorphan, ondansetron (ZOFRAN) IV, oxyCODONE-acetaminophen, sodium chloride flush, sodium chloride flush   Assessment/Plan   1. Acute on Chronic Biventricular Heart Failure  - due to NICM - suspect due to HTN and OSA +/- ETOH - cath 03/2013 no CAD - Echo 11/20 EF 30-35% - Echo 4/21 EF 20-25% moderate RV dysfunction (worse since previous) - Personally reviewed - TEE on 04/18/20. Procedure c/b by severe respiratory distress and laryngospasm. LVEF 25% RV moderately HK. Aortic valve not interrogated completely due to respiratory distress. AI appeared to be moderate.  - cMRI 8/21 EF 24% Moderate AI. No LGE or infiltrative process. - Echo 03/22/21 EF 25-30% mod AI Moderate RV dysfunction.  - Echo 01/26/2022 EF < 20% with mild LV dilation, RV severely HK with moderate dilation and D-shaped septum, AI moderate to severe.  - Suspect CM related to HTN +/- ETOH. AI may be contributing but severity of biventricular dysfunction out of proportion to AI and LV not overly dilated. - [facility] 02/13/2022 3v CAD/ RHC with elevated filling pressures.  - On milrinone 0.25.  Co-ox 71%.    - Volume status trending back up off diuretics, wt up 6 lb. CVP 10-11. AKI  resolved -  Give 40 mg IV Lasix x 1 - Continue to hold farxiga and spiro  - Continue digoxin 0.125 mg daily. Dig level < 0.2  - RV moderate to severe HK on echo but PAPI ok  - Ideally will need transplant but is still smoking and using THC. UDS negative. Will plan VAD this admit + AVR +/- graft to RCA - VAD schedule for tomorrow  - S/P Teeth extractions 3/1  2. Moderate-severe aortic regurgitation - Visually mild to moderate on TEE 4/21 but P1/2 time 215 msec suggesting severe AI.  - TEE on  04/18/20. Procedure c/b by severe respiratory distress and laryngospasm. LVEF 25% RV moderately HK. Aortic valve not interrogated completely due to respiratory distress. AI appeared to be moderate - TEE reviewed with echo team felt to be moderate at worst. - Mild AI on echo 3/22. - Moderate to severe AI on echo 02/16/2022.  Suspect AI is not cause of cardiomyopathy but is likely making symptoms worse.  - Plan AVR at time of VAD  3. CAD - cath 3v CAD LAD 40% D1 90%, OM 90% mRCA 80% - plan graft to RCA at time of VAD for RV support - No chest pain.  - Continue ASA and atorvastatin 80 mg daily.    4. OSA on CPAP - Fully compliant w/ CPAP.  - Follows with Dr. Radford Pax.   5. Obesity. -Body mass index is 36.5 kg/m.   6. Tobacco use/ETOH - Says he stopped drinking - Continues to smoke, encouraged to quit.    7. ? Fatty Liver - Reports recent diagnosis at [facility], records not available.  8. AKI  - Off diurectics/farxiga.  - Now resolved  - SCr 1.42>>1.25>>1.09 - Follow BMP   9. Polyarticular Gout - Completed prednisone burst.  - Uric acid  6 but improved with steroids  10. Nonsustained VT - Stable - Keep K> 4.0 Mg > 2.0  11. PSVT - follow  12. Malnutrition -Prealbumin 9.5 -Dietitian appreciated.   13. Hypertension  - BPs elevated overnight, suspect 2/2 post-op tooth pain - Improving this morning - Order PRN IV hydralazine, for SBP > 150   Length of Stay: [address] PA-C  02/21/2022, 7:30 AM  Advanced Heart Failure Team Pager [phone] (M-F; 7a - 5p)  Please contact [facility] for night-coverage after hours (5p -7a ) and weekends on [email or web address]  Patient seen and examined with the above-signed Advanced Practice Provider and/or Housestaff. I personally reviewed laboratory data, imaging studies and relevant notes. I independently examined the patient and formulated the important aspects of the plan. I have edited the note to reflect any of my changes or salient points. I have personally discussed the plan with the patient and/or family.   Remains on milrinone. Co-ox ok. CVP trending up. Mouth sore from teeth extraction. No CP or SOB  General:  Well appearing. No resp difficulty HEENT: normal mouth swollen Neck: supple. JVP 10 Carotids 2+ bilat; no bruits. No lymphadenopathy or thryomegaly appreciated. Cor: PMI nondisplaced. Regular rate & rhythm. 2/6 AI/AS. Lungs: clear Abdomen: obese soft, nontender, nondistended. No hepatosplenomegaly. No bruits or masses. Good bowel sounds. Extremities: no cyanosis, clubbing, rash, tr edema Neuro: alert & orientedx3, cranial nerves grossly intact. moves all 4 extremities w/o difficulty. Affect pleasant  Continue milrinone. Needs diuresis today. Plan VAD in am.   [name], MD  3:59 PM    .

## 2022-02-21 NOTE — Progress Notes (Signed)
Patient ID: Bereket Gernert, male   DOB: 01-Oct-1971, 51 y.o.   MRN: [id number] ? ? ? ?Progress Note from the Palliative Medicine Team at [facility] ? ? ?Patient Name: Colin Walton        ?Date: 02/21/2022 ?DOB: 26-Dec-1970  Age: 51 y.o. MRN#: [id number] ?Attending Physician: Dolores Patty, MD ?Primary Care Physician: Shanna Cisco, NP ?Admit Date: 01/31/2022 ? ? ?Medical records reviewed  ? ?51 y.o. male   admitted on 02/07/2022 with acute on chronic biventricular heart failure secondary to NICM.  Moderate-severe aortic regurgitation, hypertension, OSA on CPAP obesity, h/o tobacco/EtOH use, gout.  Admitted for treatment and stabilization.  ?  ?Currently in work-up for LVAD implantation.  Plan for LVAD implantation tomorrow ? ?This NP visited patient at the bedside as a follow up for palliative medicine needs and emotional support.  Patient's mother at bedside. ? ?Patient and family anticipate LVAD procedure this coming Friday.  Created space and opportunity for patient and his mother to explore thoughts and feelings regarding upcoming surgery.  Patient verbalizes he is ready, and trust "what will be will be".  His fundamental faith base provides him hope and comfort. ? ?Education offered and description offered to patient's mother regarding how the patient will appear returning from surgery.  We discussed the multiple life support machines IV drips and need for intensive nursing care.   She verbalizes appreciation, she tells me she has been thinking about that. ? ?Education offered to patient/family the importance of continued conversation with each  and the medical providers regarding overall plan of care and treatment options,  ensuring decisions are within the context of the patients values and GOCs. ? ?Encouragement and positive thoughts offered to patient and family as he anticipates surgery. ? ? ?Questions and concerns addressed   ? ?This nurse practitioner informed  the patient/family  that I will be  out of the hospital until Monday morning. I will follow-up at that time. ? ?Call palliative medicine team phone # [phone] with questions or concerns in the interim.  ? ?Lorinda Creed NP  ?Palliative Medicine Team Team Phone # [phone] ?Pager [phone] ?  ?

## 2022-02-21 NOTE — Progress Notes (Signed)
Colin Walton has been discussed with the VAD Medical Review board on 02/18/22. The team feels as if the patient is a good candidate for Destination LVAD therapy. The patient meets criteria for a LVAD implant as listed below: ? ?1) NYHA Class: _IV_____ documented on __2/17/23___(date) ? ?2) Has a left ventricular ejection fraction (LVEF) < 25% ? ? *EF___<20%___ by echo (date) _2/17/23____ ? ?3) Must meet one of the following: ? ? Is inotrope dependent ? ? *On inotropes Milrinone & Levophed started__2/20/23______ ? ?OR ? ?Has a Cardiac Index (CI) < 2.2  L/min/m2 while not on inotropes: ? ? *CI:______ ? ? ?4) Must meet one of the following: ? ? ___x___ Is on optimal medical management (OMM), based on current heart failure practice guidelines for at least 45 of the last 60 days and are failing to respond ? ? ______ Has advanced heart failure for at least 14 days and are dependent on an intra-aortic balloon pump (IABP) or similar temporary mechanical circulatory support for at least 7 days ?  ?   ____ IABP inserted (date) ____ ?  ?   ____ Impella inserted (date) ____ ? ?5)  Social work and palliative care evaluations demonstrate appropriate support system in place for discharge to home with a VAD and that end of life discussions have taken place. Both services have expressed no concern regarding patient's candidacy.  ? ?      *Social work consult (date): 01/26/2022 ? ?      *Palliative Care Consult (date): 02/15/2022 ? ?6)  Primary caretaker identified that can be taught along with the patient how to manage  ?      the VAD equipment. ? ?      *Name: Syrus Nakama (mother) ? ?7)  Deemed appropriate by our financial coordinator: Cecilio Asper ? ?      Prior approval: I spoke with Erline Levine at [place] (through [employer]) regarding prior authorization for LVAD implant. She informed me implant has been improved, and provided me with the following authorization number: [id number].  ? ?8)  VAD Coordinator, Zada Girt  has met with patient and caregiver, shown them the VAD equipment and discussed with the patient and caregiver about lifestyle changes necessary for success on mechanical circulatory device. ? ?      *Met with Orien and his mother on 02/13/22. ?      *Consent for VAD Evaluation/Caregiver Agreement/HIPPA Release/Photo Release signed on 02/13/22 ? ? ?9)  Six Minute Walk:  872 ft ? ?10) KCCQ ? ?[facility] Cardiomyopathy Questionnaire  ?KCCQ-12      ?1 a. Ability to shower/bathe Moderately limited    ?1 b. Ability to walk 1 block Moderately limited    ?1 c. Ability to hurry/jog Extremely limited    ?2. Edema feet/ankles/legs 1-2 times per week    ?3. Limited by fatigue Less than once a week    ?4. Limited by dyspnea 1-2 times per week    ?5. Sitting up / on 3+ pillows Never over the past 2 weeks    ?6. Limited enjoyment of life Slightly limited    ?7. Rest of life w/ symptoms Not at all satisfied    ?8 a. Participation in hobbies Did not limit at all    ?8 b. Participation in chores Moderately limited    ?8 c. Visiting family/friends Slightly limited    ? ? ?11)  Intermacs profile: 3 ? ?INTERMACS? 1: Critical cardiogenic shock describes a patient who is ?  crashing and burning?, in which a patient has life-threatening hypotension and rapidly escalating inotropic pressor support, with critical organ hypoperfusion often confirmed by worsening acidosis and lactate levels. ? ?INTERMACS? 2: Progressive decline describes a patient who has been demonstrated ?dependent? on inotropic support but nonetheless shows signs of continuing deterioration in nutrition, renal function, fluid retention, or other major status indicator. Patient profile 2 can also describe a patient with refractory volume overload, perhaps with evidence of impaired perfusion, in whom inotropic infusions cannot be maintained due to tachyarrhythmias, clinical ischemia, or other intolerance. ? ?INTERMACS? 3: Stable but inotrope dependent describes a patient who  is clinically stable on mild-moderate doses of intravenous inotropes (or has a temporary circulatory support device) after repeated documentation of failure to wean without symptomatic hypotension, worsening symptoms, or progressive organ dysfunction (usually renal). It is critical to monitor nutrition, renal function, fluid balance, and overall status carefully in order to distinguish between a   ?patient who is truly stable at Patient Profile 3 and a patient who has unappreciated decline rendering them Patient Profile 2. This patient may be either at home or in the hospital.   ?   ?INTERMACS? 4: Resting symptoms describes a patient who is at home on oral therapy but frequently has symptoms of congestion at rest or with activities of daily living (ADL). He or she may have orthopnea, shortness of breath during ADL such as dressing or bathing, gastrointestinal symptoms (abdominal discomfort, nausea, poor appetite), disabling ascites or severe lower extremity edema. This patient should be carefully considered for more intensive management and surveillance programs, which may in some cases, reveal poor compliance that would compromise outcomes with any therapy.   ?.   ?INTERMACS? 5: Exertion Intolerant describes a patient who is comfortable at rest but unable to engage in any activity, living predominantly within the house or housebound. This patient has no congestive symptoms, but may have chronically elevated volume status, frequently with renal dysfunction, and may be characterized as exercise intolerant.   ?   ?INTERMACS? 6: Exertion Limited also describes a patient who is comfortable at rest without evidence of fluid overload, but who is able to do some mild activity. Activities of daily living are comfortable and minor activities outside the home such as visiting friends or going to a restaurant can be performed, but fatigue results within a few minutes of any meaningful physical exertion. This patient has  occasional episodes of worsening symptoms and is likely to have had a hospitalization for heart failure within the past year.  ? ?INTERMACS? 7: Advanced NYHA Class 3 describes a patient who is clinically stable with a reasonable level of comfortable activity, despite history of previous decompensation that is not recent. This patient is usually able to walk more than a block. Any decompensation requiring intravenous diuretics or hospitalization within the previous month should make this person a Patient Profile 6 or lower.   ? ? ?Emerson Monte RN ?VAD Coordinator  ?Office: [phone]  ?24/7 Pager: [phone]  ? ?

## 2022-02-22 ENCOUNTER — Encounter (HOSPITAL_COMMUNITY): Payer: Self-pay | Admitting: Internal Medicine

## 2022-02-22 ENCOUNTER — Inpatient Hospital Stay (HOSPITAL_COMMUNITY): Payer: 59 | Admitting: Certified Registered Nurse Anesthetist

## 2022-02-22 ENCOUNTER — Other Ambulatory Visit: Payer: Self-pay

## 2022-02-22 ENCOUNTER — Inpatient Hospital Stay (HOSPITAL_COMMUNITY): Payer: 59

## 2022-02-22 ENCOUNTER — Inpatient Hospital Stay (HOSPITAL_COMMUNITY): Admission: AD | Disposition: E | Payer: Self-pay | Source: Ambulatory Visit | Attending: Internal Medicine

## 2022-02-22 DIAGNOSIS — I110 Hypertensive heart disease with heart failure: Secondary | ICD-10-CM

## 2022-02-22 DIAGNOSIS — I083 Combined rheumatic disorders of mitral, aortic and tricuspid valves: Secondary | ICD-10-CM

## 2022-02-22 DIAGNOSIS — I509 Heart failure, unspecified: Secondary | ICD-10-CM

## 2022-02-22 DIAGNOSIS — I2510 Atherosclerotic heart disease of native coronary artery without angina pectoris: Secondary | ICD-10-CM

## 2022-02-22 DIAGNOSIS — I351 Nonrheumatic aortic (valve) insufficiency: Secondary | ICD-10-CM

## 2022-02-22 DIAGNOSIS — I428 Other cardiomyopathies: Secondary | ICD-10-CM

## 2022-02-22 DIAGNOSIS — R570 Cardiogenic shock: Secondary | ICD-10-CM

## 2022-02-22 HISTORY — PX: CORONARY ARTERY BYPASS GRAFT: SHX141

## 2022-02-22 HISTORY — PX: TEE WITHOUT CARDIOVERSION: SHX5443

## 2022-02-22 HISTORY — PX: AORTIC VALVE REPAIR: SHX6306

## 2022-02-22 HISTORY — PX: ENDOVEIN HARVEST OF GREATER SAPHENOUS VEIN: SHX5059

## 2022-02-22 HISTORY — PX: INSERTION OF IMPLANTABLE LEFT VENTRICULAR ASSIST DEVICE: SHX5866

## 2022-02-22 LAB — CBC
HCT: 51 % (ref 39.0–52.0)
HCT: 35.4 % — ABNORMAL LOW (ref 39.0–52.0)
Hemoglobin: 15.9 g/dL (ref 13.0–17.0)
Hemoglobin: 11.7 g/dL — ABNORMAL LOW (ref 13.0–17.0)
MCH: 29.1 pg (ref 26.0–34.0)
MCH: 30.3 pg (ref 26.0–34.0)
MCHC: 31.2 g/dL (ref 30.0–36.0)
MCHC: 33.1 g/dL (ref 30.0–36.0)
MCV: 93.2 fL (ref 80.0–100.0)
MCV: 91.7 fL (ref 80.0–100.0)
Platelets: 227 10*3/uL (ref 150–400)
Platelets: 132 10*3/uL — ABNORMAL LOW (ref 150–400)
RBC: 5.47 MIL/uL (ref 4.22–5.81)
RBC: 3.86 MIL/uL — ABNORMAL LOW (ref 4.22–5.81)
RDW: 13.9 % (ref 11.5–15.5)
RDW: 14.5 % (ref 11.5–15.5)
WBC: 5.8 10*3/uL (ref 4.0–10.5)
WBC: 18 10*3/uL — ABNORMAL HIGH (ref 4.0–10.5)
nRBC: 0 % (ref 0.0–0.2)
nRBC: 0 % (ref 0.0–0.2)

## 2022-02-22 LAB — POCT I-STAT 7, (LYTES, BLD GAS, ICA,H+H)
Acid-Base Excess: 3 mmol/L — ABNORMAL HIGH (ref 0.0–2.0)
Acid-Base Excess: 6 mmol/L — ABNORMAL HIGH (ref 0.0–2.0)
Acid-Base Excess: 5 mmol/L — ABNORMAL HIGH (ref 0.0–2.0)
Acid-Base Excess: 5 mmol/L — ABNORMAL HIGH (ref 0.0–2.0)
Acid-Base Excess: 4 mmol/L — ABNORMAL HIGH (ref 0.0–2.0)
Acid-Base Excess: 4 mmol/L — ABNORMAL HIGH (ref 0.0–2.0)
Acid-Base Excess: 2 mmol/L (ref 0.0–2.0)
Bicarbonate: 30.4 mmol/L — ABNORMAL HIGH (ref 20.0–28.0)
Bicarbonate: 32.2 mmol/L — ABNORMAL HIGH (ref 20.0–28.0)
Bicarbonate: 30.8 mmol/L — ABNORMAL HIGH (ref 20.0–28.0)
Bicarbonate: 28.9 mmol/L — ABNORMAL HIGH (ref 20.0–28.0)
Bicarbonate: 28.8 mmol/L — ABNORMAL HIGH (ref 20.0–28.0)
Bicarbonate: 28.4 mmol/L — ABNORMAL HIGH (ref 20.0–28.0)
Bicarbonate: 27.1 mmol/L (ref 20.0–28.0)
Calcium, Ion: 1.24 mmol/L (ref 1.15–1.40)
Calcium, Ion: 1.12 mmol/L — ABNORMAL LOW (ref 1.15–1.40)
Calcium, Ion: 1.16 mmol/L (ref 1.15–1.40)
Calcium, Ion: 1.14 mmol/L — ABNORMAL LOW (ref 1.15–1.40)
Calcium, Ion: 1.1 mmol/L — ABNORMAL LOW (ref 1.15–1.40)
Calcium, Ion: 1.03 mmol/L — ABNORMAL LOW (ref 1.15–1.40)
Calcium, Ion: 1.15 mmol/L (ref 1.15–1.40)
HCT: 49 % (ref 39.0–52.0)
HCT: 38 % — ABNORMAL LOW (ref 39.0–52.0)
HCT: 41 % (ref 39.0–52.0)
HCT: 39 % (ref 39.0–52.0)
HCT: 35 % — ABNORMAL LOW (ref 39.0–52.0)
HCT: 36 % — ABNORMAL LOW (ref 39.0–52.0)
HCT: 31 % — ABNORMAL LOW (ref 39.0–52.0)
Hemoglobin: 16.7 g/dL (ref 13.0–17.0)
Hemoglobin: 12.9 g/dL — ABNORMAL LOW (ref 13.0–17.0)
Hemoglobin: 13.9 g/dL (ref 13.0–17.0)
Hemoglobin: 13.3 g/dL (ref 13.0–17.0)
Hemoglobin: 11.9 g/dL — ABNORMAL LOW (ref 13.0–17.0)
Hemoglobin: 12.2 g/dL — ABNORMAL LOW (ref 13.0–17.0)
Hemoglobin: 10.5 g/dL — ABNORMAL LOW (ref 13.0–17.0)
O2 Saturation: 100 %
O2 Saturation: 100 %
O2 Saturation: 100 %
O2 Saturation: 100 %
O2 Saturation: 100 %
O2 Saturation: 100 %
O2 Saturation: 100 %
Potassium: 4.9 mmol/L (ref 3.5–5.1)
Potassium: 4.5 mmol/L (ref 3.5–5.1)
Potassium: 5.6 mmol/L — ABNORMAL HIGH (ref 3.5–5.1)
Potassium: 6.1 mmol/L — ABNORMAL HIGH (ref 3.5–5.1)
Potassium: 5.6 mmol/L — ABNORMAL HIGH (ref 3.5–5.1)
Potassium: 4.6 mmol/L (ref 3.5–5.1)
Potassium: 4.2 mmol/L (ref 3.5–5.1)
Sodium: 136 mmol/L (ref 135–145)
Sodium: 137 mmol/L (ref 135–145)
Sodium: 134 mmol/L — ABNORMAL LOW (ref 135–145)
Sodium: 133 mmol/L — ABNORMAL LOW (ref 135–145)
Sodium: 133 mmol/L — ABNORMAL LOW (ref 135–145)
Sodium: 137 mmol/L (ref 135–145)
Sodium: 139 mmol/L (ref 135–145)
TCO2: 32 mmol/L (ref 22–32)
TCO2: 34 mmol/L — ABNORMAL HIGH (ref 22–32)
TCO2: 32 mmol/L (ref 22–32)
TCO2: 30 mmol/L (ref 22–32)
TCO2: 30 mmol/L (ref 22–32)
TCO2: 30 mmol/L (ref 22–32)
TCO2: 29 mmol/L (ref 22–32)
pCO2 arterial: 55 mmHg — ABNORMAL HIGH (ref 32–48)
pCO2 arterial: 51.5 mmHg — ABNORMAL HIGH (ref 32–48)
pCO2 arterial: 51.4 mmHg — ABNORMAL HIGH (ref 32–48)
pCO2 arterial: 40.4 mmHg (ref 32–48)
pCO2 arterial: 41.6 mmHg (ref 32–48)
pCO2 arterial: 42.4 mmHg (ref 32–48)
pCO2 arterial: 46.4 mmHg (ref 32–48)
pH, Arterial: 7.351 (ref 7.35–7.45)
pH, Arterial: 7.404 (ref 7.35–7.45)
pH, Arterial: 7.386 (ref 7.35–7.45)
pH, Arterial: 7.462 — ABNORMAL HIGH (ref 7.35–7.45)
pH, Arterial: 7.449 (ref 7.35–7.45)
pH, Arterial: 7.434 (ref 7.35–7.45)
pH, Arterial: 7.375 (ref 7.35–7.45)
pO2, Arterial: 293 mmHg — ABNORMAL HIGH (ref 83–108)
pO2, Arterial: 337 mmHg — ABNORMAL HIGH (ref 83–108)
pO2, Arterial: 404 mmHg — ABNORMAL HIGH (ref 83–108)
pO2, Arterial: 296 mmHg — ABNORMAL HIGH (ref 83–108)
pO2, Arterial: 352 mmHg — ABNORMAL HIGH (ref 83–108)
pO2, Arterial: 252 mmHg — ABNORMAL HIGH (ref 83–108)
pO2, Arterial: 204 mmHg — ABNORMAL HIGH (ref 83–108)

## 2022-02-22 LAB — POCT I-STAT, CHEM 8
BUN: 16 mg/dL (ref 6–20)
BUN: 15 mg/dL (ref 6–20)
BUN: 15 mg/dL (ref 6–20)
BUN: 16 mg/dL (ref 6–20)
BUN: 18 mg/dL (ref 6–20)
BUN: 18 mg/dL (ref 6–20)
BUN: 18 mg/dL (ref 6–20)
BUN: 14 mg/dL (ref 6–20)
Calcium, Ion: 1.21 mmol/L (ref 1.15–1.40)
Calcium, Ion: 1.14 mmol/L — ABNORMAL LOW (ref 1.15–1.40)
Calcium, Ion: 1.14 mmol/L — ABNORMAL LOW (ref 1.15–1.40)
Calcium, Ion: 1.13 mmol/L — ABNORMAL LOW (ref 1.15–1.40)
Calcium, Ion: 1.14 mmol/L — ABNORMAL LOW (ref 1.15–1.40)
Calcium, Ion: 1.15 mmol/L (ref 1.15–1.40)
Calcium, Ion: 1.04 mmol/L — ABNORMAL LOW (ref 1.15–1.40)
Calcium, Ion: 1.14 mmol/L — ABNORMAL LOW (ref 1.15–1.40)
Chloride: 99 mmol/L (ref 98–111)
Chloride: 100 mmol/L (ref 98–111)
Chloride: 99 mmol/L (ref 98–111)
Chloride: 100 mmol/L (ref 98–111)
Chloride: 98 mmol/L (ref 98–111)
Chloride: 100 mmol/L (ref 98–111)
Chloride: 99 mmol/L (ref 98–111)
Chloride: 101 mmol/L (ref 98–111)
Creatinine, Ser: 0.8 mg/dL (ref 0.61–1.24)
Creatinine, Ser: 0.8 mg/dL (ref 0.61–1.24)
Creatinine, Ser: 0.8 mg/dL (ref 0.61–1.24)
Creatinine, Ser: 0.8 mg/dL (ref 0.61–1.24)
Creatinine, Ser: 0.8 mg/dL (ref 0.61–1.24)
Creatinine, Ser: 0.8 mg/dL (ref 0.61–1.24)
Creatinine, Ser: 0.8 mg/dL (ref 0.61–1.24)
Creatinine, Ser: 0.8 mg/dL (ref 0.61–1.24)
Glucose, Bld: 107 mg/dL — ABNORMAL HIGH (ref 70–99)
Glucose, Bld: 94 mg/dL (ref 70–99)
Glucose, Bld: 98 mg/dL (ref 70–99)
Glucose, Bld: 133 mg/dL — ABNORMAL HIGH (ref 70–99)
Glucose, Bld: 135 mg/dL — ABNORMAL HIGH (ref 70–99)
Glucose, Bld: 127 mg/dL — ABNORMAL HIGH (ref 70–99)
Glucose, Bld: 125 mg/dL — ABNORMAL HIGH (ref 70–99)
Glucose, Bld: 139 mg/dL — ABNORMAL HIGH (ref 70–99)
HCT: 52 % (ref 39.0–52.0)
HCT: 41 % (ref 39.0–52.0)
HCT: 48 % (ref 39.0–52.0)
HCT: 38 % — ABNORMAL LOW (ref 39.0–52.0)
HCT: 39 % (ref 39.0–52.0)
HCT: 37 % — ABNORMAL LOW (ref 39.0–52.0)
HCT: 35 % — ABNORMAL LOW (ref 39.0–52.0)
HCT: 33 % — ABNORMAL LOW (ref 39.0–52.0)
Hemoglobin: 17.7 g/dL — ABNORMAL HIGH (ref 13.0–17.0)
Hemoglobin: 13.9 g/dL (ref 13.0–17.0)
Hemoglobin: 16.3 g/dL (ref 13.0–17.0)
Hemoglobin: 12.9 g/dL — ABNORMAL LOW (ref 13.0–17.0)
Hemoglobin: 13.3 g/dL (ref 13.0–17.0)
Hemoglobin: 12.6 g/dL — ABNORMAL LOW (ref 13.0–17.0)
Hemoglobin: 11.9 g/dL — ABNORMAL LOW (ref 13.0–17.0)
Hemoglobin: 11.2 g/dL — ABNORMAL LOW (ref 13.0–17.0)
Potassium: 5 mmol/L (ref 3.5–5.1)
Potassium: 4.7 mmol/L (ref 3.5–5.1)
Potassium: 5.6 mmol/L — ABNORMAL HIGH (ref 3.5–5.1)
Potassium: 5.9 mmol/L — ABNORMAL HIGH (ref 3.5–5.1)
Potassium: 5.9 mmol/L — ABNORMAL HIGH (ref 3.5–5.1)
Potassium: 5.2 mmol/L — ABNORMAL HIGH (ref 3.5–5.1)
Potassium: 4.6 mmol/L (ref 3.5–5.1)
Potassium: 4.2 mmol/L (ref 3.5–5.1)
Sodium: 137 mmol/L (ref 135–145)
Sodium: 135 mmol/L (ref 135–145)
Sodium: 136 mmol/L (ref 135–145)
Sodium: 132 mmol/L — ABNORMAL LOW (ref 135–145)
Sodium: 132 mmol/L — ABNORMAL LOW (ref 135–145)
Sodium: 134 mmol/L — ABNORMAL LOW (ref 135–145)
Sodium: 137 mmol/L (ref 135–145)
Sodium: 139 mmol/L (ref 135–145)
TCO2: 29 mmol/L (ref 22–32)
TCO2: 29 mmol/L (ref 22–32)
TCO2: 32 mmol/L (ref 22–32)
TCO2: 32 mmol/L (ref 22–32)
TCO2: 29 mmol/L (ref 22–32)
TCO2: 31 mmol/L (ref 22–32)
TCO2: 30 mmol/L (ref 22–32)
TCO2: 28 mmol/L (ref 22–32)

## 2022-02-22 LAB — BASIC METABOLIC PANEL
Anion gap: 7 (ref 5–15)
Anion gap: 5 (ref 5–15)
BUN: 16 mg/dL (ref 6–20)
BUN: 13 mg/dL (ref 6–20)
CO2: 32 mmol/L (ref 22–32)
CO2: 26 mmol/L (ref 22–32)
Calcium: 9.5 mg/dL (ref 8.9–10.3)
Calcium: 8.4 mg/dL — ABNORMAL LOW (ref 8.9–10.3)
Chloride: 96 mmol/L — ABNORMAL LOW (ref 98–111)
Chloride: 104 mmol/L (ref 98–111)
Creatinine, Ser: 0.96 mg/dL (ref 0.61–1.24)
Creatinine, Ser: 0.96 mg/dL (ref 0.61–1.24)
GFR, Estimated: >60 mL/min (ref >60)
GFR, Estimated: >60 mL/min (ref >60)
Glucose, Bld: 133 mg/dL — ABNORMAL HIGH (ref 70–99)
Glucose, Bld: 154 mg/dL — ABNORMAL HIGH (ref 70–99)
Potassium: 5.1 mmol/L (ref 3.5–5.1)
Potassium: 4.8 mmol/L (ref 3.5–5.1)
Sodium: 135 mmol/L (ref 135–145)
Sodium: 135 mmol/L (ref 135–145)

## 2022-02-22 LAB — DIC (DISSEMINATED INTRAVASCULAR COAGULATION)PANEL
D-Dimer, Quant: 1.04 ug/mL-FEU — ABNORMAL HIGH (ref 0.00–0.50)
Fibrinogen: 334 mg/dL (ref 210–475)
INR: 0.7 — ABNORMAL LOW (ref 0.8–1.2)
Platelets: 135 10*3/uL — ABNORMAL LOW (ref 150–400)
Prothrombin Time: 10.2 seconds — ABNORMAL LOW (ref 11.4–15.2)
Smear Review: NONE SEEN
aPTT: 47 seconds — ABNORMAL HIGH (ref 24–36)

## 2022-02-22 LAB — POCT I-STAT EG7
Acid-Base Excess: 5 mmol/L — ABNORMAL HIGH (ref 0.0–2.0)
Bicarbonate: 31.2 mmol/L — ABNORMAL HIGH (ref 20.0–28.0)
Calcium, Ion: 1.18 mmol/L (ref 1.15–1.40)
HCT: 40 % (ref 39.0–52.0)
Hemoglobin: 13.6 g/dL (ref 13.0–17.0)
O2 Saturation: 84 %
Potassium: 5.5 mmol/L — ABNORMAL HIGH (ref 3.5–5.1)
Sodium: 135 mmol/L (ref 135–145)
TCO2: 33 mmol/L — ABNORMAL HIGH (ref 22–32)
pCO2, Ven: 51.9 mmHg (ref 44–60)
pH, Ven: 7.387 (ref 7.25–7.43)
pO2, Ven: 51 mmHg — ABNORMAL HIGH (ref 32–45)

## 2022-02-22 LAB — GLUCOSE, CAPILLARY
Glucose-Capillary: 156 mg/dL — ABNORMAL HIGH (ref 70–99)
Glucose-Capillary: 152 mg/dL — ABNORMAL HIGH (ref 70–99)
Glucose-Capillary: 170 mg/dL — ABNORMAL HIGH (ref 70–99)
Glucose-Capillary: 187 mg/dL — ABNORMAL HIGH (ref 70–99)
Glucose-Capillary: 206 mg/dL — ABNORMAL HIGH (ref 70–99)
Glucose-Capillary: 164 mg/dL — ABNORMAL HIGH (ref 70–99)

## 2022-02-22 LAB — FIBRINOGEN: Fibrinogen: 458 mg/dL (ref 210–475)

## 2022-02-22 LAB — COOXEMETRY PANEL
Carboxyhemoglobin: 1.6 % — ABNORMAL HIGH (ref 0.5–1.5)
Carboxyhemoglobin: 1.4 % (ref 0.5–1.5)
Methemoglobin: <0.7 % (ref 0.0–1.5)
Methemoglobin: <0.7 % (ref 0.0–1.5)
O2 Saturation: 67.1 %
O2 Saturation: 75.4 %
Total hemoglobin: 16.4 g/dL — ABNORMAL HIGH (ref 12.0–16.0)
Total hemoglobin: 10.7 g/dL — ABNORMAL LOW (ref 12.0–16.0)

## 2022-02-22 LAB — HEMOGLOBIN AND HEMATOCRIT, BLOOD
HCT: 42.1 % (ref 39.0–52.0)
Hemoglobin: 13.4 g/dL (ref 13.0–17.0)

## 2022-02-22 LAB — MAGNESIUM: Magnesium: 2 mg/dL (ref 1.7–2.4)

## 2022-02-22 LAB — PROTIME-INR
INR: 0.7 — ABNORMAL LOW (ref 0.8–1.2)
Prothrombin Time: 10.3 seconds — ABNORMAL LOW (ref 11.4–15.2)

## 2022-02-22 LAB — PLATELET COUNT: Platelets: 202 10*3/uL (ref 150–400)

## 2022-02-22 LAB — PREPARE RBC (CROSSMATCH)

## 2022-02-22 LAB — APTT: aPTT: 46 seconds — ABNORMAL HIGH (ref 24–36)

## 2022-02-22 SURGERY — INSERTION OF IMPLANTABLE LEFT VENTRICULAR ASSIST DEVICE
Anesthesia: General | Site: Leg Lower | Laterality: Right

## 2022-02-22 MED ORDER — PHENYLEPHRINE 40 MCG/ML (10ML) SYRINGE FOR IV PUSH (FOR BLOOD PRESSURE SUPPORT)
PREFILLED_SYRINGE | INTRAVENOUS | Status: AC
  Filled 2022-02-22: qty 10

## 2022-02-22 MED ORDER — CHLORHEXIDINE GLUCONATE 0.12 % MT SOLN: 15.0000 mL | Freq: Once | OROMUCOSAL | Status: DC

## 2022-02-22 MED ORDER — TRANEXAMIC ACID 1000 MG/10ML IV SOLN
1.5000 mg/kg/h | INTRAVENOUS | Status: DC
  Filled 2022-02-22: qty 25

## 2022-02-22 MED ORDER — LACTATED RINGERS IV SOLN: 500.0000 mL | Freq: Once | INTRAVENOUS | Status: DC | PRN

## 2022-02-22 MED ORDER — DEXTROSE 50 % IV SOLN
0.0000 mL | INTRAVENOUS | Status: DC | PRN
  Administered 2022-02-25 – 2022-02-26 (×8): 50 mL via INTRAVENOUS
  Filled 2022-02-22 (×9): qty 50

## 2022-02-22 MED ORDER — EPHEDRINE SULFATE-NACL 50-0.9 MG/10ML-% IV SOSY
PREFILLED_SYRINGE | INTRAVENOUS | Status: DC | PRN
  Administered 2022-02-22: 10 mg via INTRAVENOUS
  Administered 2022-02-22: 5 mg via INTRAVENOUS

## 2022-02-22 MED ORDER — ACETAMINOPHEN 160 MG/5ML PO SOLN: 650.0000 mg | Freq: Once | ORAL | Status: AC

## 2022-02-22 MED ORDER — ALBUMIN HUMAN 5 % IV SOLN
250.0000 mL | INTRAVENOUS | Status: DC | PRN
  Administered 2022-02-22 (×2): 12.5 g via INTRAVENOUS
  Filled 2022-02-22 (×2): qty 250

## 2022-02-22 MED ORDER — LACTATED RINGERS IV SOLN: INTRAVENOUS | Status: DC

## 2022-02-22 MED ORDER — ROCURONIUM BROMIDE 10 MG/ML (PF) SYRINGE
PREFILLED_SYRINGE | INTRAVENOUS | Status: AC
  Filled 2022-02-22: qty 10

## 2022-02-22 MED ORDER — POTASSIUM CHLORIDE 10 MEQ/50ML IV SOLN: 10.0000 meq | INTRAVENOUS | Status: AC

## 2022-02-22 MED ORDER — SUCCINYLCHOLINE CHLORIDE 200 MG/10ML IV SOSY
PREFILLED_SYRINGE | INTRAVENOUS | Status: DC | PRN
  Administered 2022-02-22: 160 mg via INTRAVENOUS

## 2022-02-22 MED ORDER — MILRINONE LACTATE IN DEXTROSE 20-5 MG/100ML-% IV SOLN
0.2500 ug/kg/min | INTRAVENOUS | Status: DC
  Administered 2022-02-23: 0.25 ug/kg/min via INTRAVENOUS
  Administered 2022-02-23 (×2): 0.375 ug/kg/min via INTRAVENOUS
  Filled 2022-02-22 (×4): qty 100

## 2022-02-22 MED ORDER — ACETAMINOPHEN 500 MG PO TABS: 1000.0000 mg | ORAL_TABLET | Freq: Four times a day (QID) | ORAL | Status: DC

## 2022-02-22 MED ORDER — SODIUM CHLORIDE 0.9 % IV SOLN: INTRAVENOUS | Status: DC

## 2022-02-22 MED ORDER — ROCURONIUM BROMIDE 10 MG/ML (PF) SYRINGE
PREFILLED_SYRINGE | INTRAVENOUS | Status: AC
  Filled 2022-02-22: qty 20

## 2022-02-22 MED ORDER — ROCURONIUM BROMIDE 10 MG/ML (PF) SYRINGE
PREFILLED_SYRINGE | INTRAVENOUS | Status: DC | PRN
  Administered 2022-02-22: 100 mg via INTRAVENOUS
  Administered 2022-02-22: 30 mg via INTRAVENOUS
  Administered 2022-02-22: 100 mg via INTRAVENOUS
  Administered 2022-02-22: 30 mg via INTRAVENOUS
  Administered 2022-02-22 (×2): 100 mg via INTRAVENOUS

## 2022-02-22 MED ORDER — SODIUM CHLORIDE 0.9% FLUSH
3.0000 mL | Freq: Two times a day (BID) | INTRAVENOUS | Status: DC
  Administered 2022-02-25: 3 mL via INTRAVENOUS

## 2022-02-22 MED ORDER — SODIUM CHLORIDE 0.45 % IV SOLN: INTRAVENOUS | Status: DC | PRN

## 2022-02-22 MED ORDER — OXYCODONE HCL 5 MG PO TABS: 5.0000 mg | ORAL_TABLET | ORAL | Status: DC | PRN

## 2022-02-22 MED ORDER — SODIUM CHLORIDE 0.9% IV SOLUTION: Freq: Once | INTRAVENOUS | Status: DC

## 2022-02-22 MED ORDER — FENTANYL CITRATE (PF) 250 MCG/5ML IJ SOLN
INTRAMUSCULAR | Status: AC
  Filled 2022-02-22: qty 5

## 2022-02-22 MED ORDER — PHENYLEPHRINE 40 MCG/ML (10ML) SYRINGE FOR IV PUSH (FOR BLOOD PRESSURE SUPPORT)
PREFILLED_SYRINGE | INTRAVENOUS | Status: DC | PRN
  Administered 2022-02-22: 40 ug via INTRAVENOUS

## 2022-02-22 MED ORDER — AMIODARONE HCL IN DEXTROSE 360-4.14 MG/200ML-% IV SOLN
30.0000 mg/h | INTRAVENOUS | Status: DC
  Administered 2022-02-22 – 2022-02-24 (×6): 60 mg/h via INTRAVENOUS
  Administered 2022-02-24 – 2022-02-26 (×5): 30 mg/h via INTRAVENOUS
  Filled 2022-02-22 (×11): qty 200

## 2022-02-22 MED ORDER — HEPARIN SODIUM (PORCINE) 1000 UNIT/ML IJ SOLN
INTRAMUSCULAR | Status: AC
  Filled 2022-02-22: qty 1

## 2022-02-22 MED ORDER — EPINEPHRINE 1 MG/10ML IJ SOSY
PREFILLED_SYRINGE | INTRAMUSCULAR | Status: AC
  Filled 2022-02-22: qty 10

## 2022-02-22 MED ORDER — ONDANSETRON HCL 4 MG/2ML IJ SOLN
4.0000 mg | Freq: Four times a day (QID) | INTRAMUSCULAR | Status: DC | PRN
  Administered 2022-02-23: 4 mg via INTRAVENOUS
  Filled 2022-02-22: qty 2

## 2022-02-22 MED ORDER — CEFAZOLIN SODIUM-DEXTROSE 2-4 GM/100ML-% IV SOLN
2.0000 g | Freq: Three times a day (TID) | INTRAVENOUS | Status: DC
  Administered 2022-02-22 – 2022-02-24 (×5): 2 g via INTRAVENOUS
  Filled 2022-02-22 (×5): qty 100

## 2022-02-22 MED ORDER — VANCOMYCIN HCL IN DEXTROSE 1-5 GM/200ML-% IV SOLN
1000.0000 mg | Freq: Two times a day (BID) | INTRAVENOUS | Status: AC
  Administered 2022-02-22 – 2022-02-23 (×3): 1000 mg via INTRAVENOUS
  Filled 2022-02-22 (×2): qty 200

## 2022-02-22 MED ORDER — ETOMIDATE 2 MG/ML IV SOLN
INTRAVENOUS | Status: AC
  Filled 2022-02-22: qty 10

## 2022-02-22 MED ORDER — SODIUM CHLORIDE 0.9 % IV SOLN
INTRAVENOUS | Status: DC | PRN
Start: 2022-02-22 — End: 2022-02-22

## 2022-02-22 MED ORDER — VANCOMYCIN HCL 1000 MG IV SOLR
INTRAVENOUS | Status: DC | PRN
  Administered 2022-02-22: 1 g

## 2022-02-22 MED ORDER — PROPOFOL 10 MG/ML IV BOLUS
INTRAVENOUS | Status: DC | PRN
  Administered 2022-02-22: 20 mg via INTRAVENOUS

## 2022-02-22 MED ORDER — FAMOTIDINE IN NACL 20-0.9 MG/50ML-% IV SOLN
20.0000 mg | Freq: Two times a day (BID) | INTRAVENOUS | Status: AC
  Administered 2022-02-22 – 2022-02-23 (×2): 20 mg via INTRAVENOUS
  Filled 2022-02-22 (×2): qty 50

## 2022-02-22 MED ORDER — HEPARIN SODIUM (PORCINE) 1000 UNIT/ML IJ SOLN
INTRAMUSCULAR | Status: DC | PRN
  Administered 2022-02-22: 38000 [IU] via INTRAVENOUS

## 2022-02-22 MED ORDER — INSULIN REGULAR(HUMAN) IN NACL 100-0.9 UT/100ML-% IV SOLN
INTRAVENOUS | Status: DC
  Administered 2022-02-22: 2.4 [IU]/h via INTRAVENOUS
  Administered 2022-02-23: 1.2 [IU]/h via INTRAVENOUS
  Filled 2022-02-22 (×3): qty 100

## 2022-02-22 MED ORDER — PANTOPRAZOLE SODIUM 40 MG PO TBEC: 40.0000 mg | DELAYED_RELEASE_TABLET | Freq: Every day | ORAL | Status: DC

## 2022-02-22 MED ORDER — DEXTROSE 5 % IV SOLN
INTRAVENOUS | Status: DC | PRN
  Administered 2022-02-22: .02 [IU]/min via INTRAVENOUS

## 2022-02-22 MED ORDER — SODIUM CHLORIDE 0.9 % IV SOLN: 250.0000 mL | INTRAVENOUS | Status: DC

## 2022-02-22 MED ORDER — SUCCINYLCHOLINE CHLORIDE 200 MG/10ML IV SOSY
PREFILLED_SYRINGE | INTRAVENOUS | Status: AC
  Filled 2022-02-22: qty 10

## 2022-02-22 MED ORDER — CHLORHEXIDINE GLUCONATE CLOTH 2 % EX PADS
6.0000 | MEDICATED_PAD | Freq: Every day | CUTANEOUS | Status: DC
  Administered 2022-02-22 – 2022-02-25 (×4): 6 via TOPICAL

## 2022-02-22 MED ORDER — COAGULATION FACTOR VIIA RECOMB 1 MG IV SOLR
5000.0000 ug | INTRAVENOUS | Status: DC
  Filled 2022-02-22: qty 5

## 2022-02-22 MED ORDER — PROTAMINE SULFATE 10 MG/ML IV SOLN
INTRAVENOUS | Status: DC | PRN
  Administered 2022-02-22: 380 mg via INTRAVENOUS

## 2022-02-22 MED ORDER — BISACODYL 5 MG PO TBEC: 10.0000 mg | DELAYED_RELEASE_TABLET | Freq: Every day | ORAL | Status: DC

## 2022-02-22 MED ORDER — AMIODARONE IV BOLUS ONLY 150 MG/100ML
INTRAVENOUS | Status: DC | PRN
  Administered 2022-02-22 (×2): 150 mg via INTRAVENOUS

## 2022-02-22 MED ORDER — MIDAZOLAM HCL 2 MG/2ML IJ SOLN
2.0000 mg | INTRAMUSCULAR | Status: DC | PRN
  Administered 2022-02-22 – 2022-02-23 (×10): 2 mg via INTRAVENOUS
  Filled 2022-02-22 (×11): qty 2

## 2022-02-22 MED ORDER — NOREPINEPHRINE 4 MG/250ML-% IV SOLN
0.0000 ug/min | INTRAVENOUS | Status: DC
  Administered 2022-02-22: 20 ug/min via INTRAVENOUS
  Administered 2022-02-23: 24 ug/min via INTRAVENOUS
  Filled 2022-02-22 (×2): qty 250

## 2022-02-22 MED ORDER — TRAMADOL HCL 50 MG PO TABS: 50.0000 mg | ORAL_TABLET | ORAL | Status: DC | PRN

## 2022-02-22 MED ORDER — DEXMEDETOMIDINE HCL IN NACL 400 MCG/100ML IV SOLN
0.4000 ug/kg/h | INTRAVENOUS | Status: DC
  Administered 2022-02-22: 0.7 ug/kg/h via INTRAVENOUS
  Administered 2022-02-23: 0.9 ug/kg/h via INTRAVENOUS
  Administered 2022-02-23: 0.4 ug/kg/h via INTRAVENOUS
  Administered 2022-02-23: 1.4 ug/kg/h via INTRAVENOUS
  Administered 2022-02-23: 1.2 ug/kg/h via INTRAVENOUS
  Filled 2022-02-22 (×2): qty 200
  Filled 2022-02-22: qty 100

## 2022-02-22 MED ORDER — SODIUM CHLORIDE 0.9% FLUSH: 3.0000 mL | INTRAVENOUS | Status: DC | PRN

## 2022-02-22 MED ORDER — ACETAMINOPHEN 160 MG/5ML PO SOLN
1000.0000 mg | Freq: Four times a day (QID) | ORAL | Status: DC
  Administered 2022-02-22 – 2022-02-24 (×6): 1000 mg
  Filled 2022-02-22 (×7): qty 40.6

## 2022-02-22 MED ORDER — EPINEPHRINE HCL 5 MG/250ML IV SOLN IN NS
0.0000 ug/min | INTRAVENOUS | Status: DC
  Administered 2022-02-23: 5 ug/min via INTRAVENOUS
  Administered 2022-02-23 – 2022-02-24 (×2): 8 ug/min via INTRAVENOUS
  Filled 2022-02-22 (×4): qty 250

## 2022-02-22 MED ORDER — HEMOSTATIC AGENTS (NO CHARGE) OPTIME
TOPICAL | Status: DC | PRN
  Administered 2022-02-22: 1 via TOPICAL

## 2022-02-22 MED ORDER — SODIUM CHLORIDE 0.9 % IV SOLN
600.0000 mg | Freq: Once | INTRAVENOUS | Status: AC
  Administered 2022-02-23: 600 mg via INTRAVENOUS
  Filled 2022-02-22: qty 10

## 2022-02-22 MED ORDER — MORPHINE SULFATE (PF) 2 MG/ML IV SOLN
1.0000 mg | INTRAVENOUS | Status: DC | PRN
  Administered 2022-02-22: 4 mg via INTRAVENOUS
  Administered 2022-02-22 (×2): 2 mg via INTRAVENOUS
  Administered 2022-02-23 (×4): 4 mg via INTRAVENOUS
  Filled 2022-02-22 (×6): qty 2

## 2022-02-22 MED ORDER — CHLORHEXIDINE GLUCONATE 0.12 % MT SOLN: 15.0000 mL | OROMUCOSAL | Status: AC

## 2022-02-22 MED ORDER — HEMOSTATIC AGENTS (NO CHARGE) OPTIME
TOPICAL | Status: DC | PRN
  Administered 2022-02-22 (×2): 2 via TOPICAL
  Administered 2022-02-22 (×4): 1 via TOPICAL

## 2022-02-22 MED ORDER — LACTATED RINGERS IV SOLN
INTRAVENOUS | Status: DC | PRN
Start: 2022-02-22 — End: 2022-02-22

## 2022-02-22 MED ORDER — ETOMIDATE 2 MG/ML IV SOLN
INTRAVENOUS | Status: DC | PRN
  Administered 2022-02-22: 20 mg via INTRAVENOUS

## 2022-02-22 MED ORDER — LACTATED RINGERS IV SOLN: INTRAVENOUS | Status: DC | PRN

## 2022-02-22 MED ORDER — MIDAZOLAM HCL (PF) 5 MG/ML IJ SOLN
INTRAMUSCULAR | Status: DC | PRN
  Administered 2022-02-22: 1 mg via INTRAVENOUS
  Administered 2022-02-22 (×2): 2 mg via INTRAVENOUS
  Administered 2022-02-22 (×2): 1 mg via INTRAVENOUS
  Administered 2022-02-22: 2 mg via INTRAVENOUS
  Administered 2022-02-22: 1 mg via INTRAVENOUS

## 2022-02-22 MED ORDER — ALBUMIN HUMAN 5 % IV SOLN: INTRAVENOUS | Status: DC | PRN

## 2022-02-22 MED ORDER — ORAL CARE MOUTH RINSE: 15.0000 mL | Freq: Once | OROMUCOSAL | Status: DC

## 2022-02-22 MED ORDER — THROMBIN 20000 UNITS EX SOLR
CUTANEOUS | Status: DC | PRN
  Administered 2022-02-22: 20000 [IU] via TOPICAL

## 2022-02-22 MED ORDER — FENTANYL CITRATE (PF) 250 MCG/5ML IJ SOLN
INTRAMUSCULAR | Status: DC | PRN
  Administered 2022-02-22: 100 ug via INTRAVENOUS
  Administered 2022-02-22 (×2): 50 ug via INTRAVENOUS
  Administered 2022-02-22: 150 ug via INTRAVENOUS
  Administered 2022-02-22 (×2): 100 ug via INTRAVENOUS
  Administered 2022-02-22 (×2): 50 ug via INTRAVENOUS
  Administered 2022-02-22 (×3): 100 ug via INTRAVENOUS
  Administered 2022-02-22: 50 ug via INTRAVENOUS

## 2022-02-22 MED ORDER — HEPARIN SODIUM (PORCINE) 1000 UNIT/ML IJ SOLN
INTRAMUSCULAR | Status: AC
  Filled 2022-02-22: qty 10

## 2022-02-22 MED ORDER — PLASMA-LYTE A IV SOLN: INTRAVENOUS | Status: DC | PRN

## 2022-02-22 MED ORDER — PROPOFOL 10 MG/ML IV BOLUS
INTRAVENOUS | Status: AC
  Filled 2022-02-22: qty 20

## 2022-02-22 MED ORDER — FLUCONAZOLE IN SODIUM CHLORIDE 400-0.9 MG/200ML-% IV SOLN
400.0000 mg | Freq: Once | INTRAVENOUS | Status: AC
  Administered 2022-02-23: 400 mg via INTRAVENOUS
  Filled 2022-02-22: qty 200

## 2022-02-22 MED ORDER — ACETAMINOPHEN 650 MG RE SUPP
650.0000 mg | Freq: Once | RECTAL | Status: AC
  Administered 2022-02-22: 650 mg via RECTAL

## 2022-02-22 MED ORDER — COAGULATION FACTOR VIIA RECOMB 1 MG IV SOLR
INTRAVENOUS | Status: DC | PRN
  Administered 2022-02-22: 5000 ug via INTRAVENOUS

## 2022-02-22 MED ORDER — DOCUSATE SODIUM 100 MG PO CAPS: 200.0000 mg | ORAL_CAPSULE | Freq: Every day | ORAL | Status: DC

## 2022-02-22 MED ORDER — ASPIRIN 81 MG PO CHEW
324.0000 mg | CHEWABLE_TABLET | Freq: Every day | ORAL | Status: DC
  Administered 2022-02-23 – 2022-02-25 (×3): 324 mg
  Filled 2022-02-22 (×3): qty 4

## 2022-02-22 MED ORDER — MIDAZOLAM HCL (PF) 10 MG/2ML IJ SOLN
INTRAMUSCULAR | Status: AC
  Filled 2022-02-22: qty 2

## 2022-02-22 MED ORDER — ASPIRIN 300 MG RE SUPP: 300.0000 mg | Freq: Every day | RECTAL | Status: DC

## 2022-02-22 MED ORDER — CALCIUM CHLORIDE 10 % IV SOLN
INTRAVENOUS | Status: DC | PRN
  Administered 2022-02-22 (×2): 200 mg via INTRAVENOUS
  Administered 2022-02-22 (×2): 100 mg via INTRAVENOUS
  Administered 2022-02-22 (×2): 200 mg via INTRAVENOUS

## 2022-02-22 MED ORDER — ASPIRIN EC 325 MG PO TBEC: 325.0000 mg | DELAYED_RELEASE_TABLET | Freq: Every day | ORAL | Status: DC

## 2022-02-22 MED ORDER — VASOPRESSIN 20 UNITS/100 ML INFUSION FOR SHOCK
0.0000 [IU]/min | INTRAVENOUS | Status: DC
  Administered 2022-02-22 – 2022-02-26 (×11): 0.04 [IU]/min via INTRAVENOUS
  Filled 2022-02-22 (×12): qty 100

## 2022-02-22 MED ORDER — THROMBIN (RECOMBINANT) 20000 UNITS EX SOLR
CUTANEOUS | Status: AC
  Filled 2022-02-22: qty 20000

## 2022-02-22 MED ORDER — MAGNESIUM SULFATE 4 GM/100ML IV SOLN
4.0000 g | Freq: Once | INTRAVENOUS | Status: AC
  Administered 2022-02-22: 4 g via INTRAVENOUS
  Filled 2022-02-22: qty 100

## 2022-02-22 MED ORDER — PROTAMINE SULFATE 10 MG/ML IV SOLN
INTRAVENOUS | Status: AC
  Filled 2022-02-22: qty 50

## 2022-02-22 MED ORDER — BISACODYL 10 MG RE SUPP
10.0000 mg | Freq: Every day | RECTAL | Status: DC
  Administered 2022-02-23 – 2022-02-24 (×2): 10 mg via RECTAL
  Filled 2022-02-22: qty 1

## 2022-02-22 MED ORDER — CALCIUM CHLORIDE 10 % IV SOLN
INTRAVENOUS | Status: AC
  Filled 2022-02-22: qty 10

## 2022-02-22 MED ORDER — THROMBIN 20000 UNITS EX SOLR
OROMUCOSAL | Status: DC | PRN
  Administered 2022-02-22 (×4): 4 mL via TOPICAL

## 2022-02-22 MED ORDER — AMIODARONE HCL IN DEXTROSE 360-4.14 MG/200ML-% IV SOLN
INTRAVENOUS | Status: DC | PRN
  Administered 2022-02-22: 60 mg/h via INTRAVENOUS

## 2022-02-22 SURGICAL SUPPLY — 129 items
ADAPTER CARDIO PERF ANTE/RETRO (ADAPTER) ×5 IMPLANT
ADPR PRFSN 84XANTGRD RTRGD (ADAPTER) ×3
BAG DECANTER FOR FLEXI CONT (MISCELLANEOUS) ×9 IMPLANT
BLADE CLIPPER SURG (BLADE) ×9 IMPLANT
BLADE STERNUM SYSTEM 6 (BLADE) ×9 IMPLANT
BLADE SURG 15 STRL LF DISP TIS (BLADE) ×4 IMPLANT
BLADE SURG 15 STRL SS (BLADE) ×4
BNDG ELASTIC 4X5.8 VLCR STR LF (GAUZE/BANDAGES/DRESSINGS) ×5 IMPLANT
BNDG ELASTIC 6X5.8 VLCR STR LF (GAUZE/BANDAGES/DRESSINGS) ×5 IMPLANT
BNDG GAUZE ELAST 4 BULKY (GAUZE/BANDAGES/DRESSINGS) ×5 IMPLANT
CANISTER SUCT 3000ML PPV (MISCELLANEOUS) ×9 IMPLANT
CANNULA ARTERIAL NVNT 3/8 20FR (MISCELLANEOUS) ×5 IMPLANT
CANNULA GUNDRY RCSP 15FR (MISCELLANEOUS) ×5 IMPLANT
CANNULA SUMP PERICARDIAL (CANNULA) ×5 IMPLANT
CANNULA VENOUS LOW PROF 34X46 (CANNULA) ×5 IMPLANT
CATH FOLEY 2WAY SLVR  5CC 14FR (CATHETERS) ×4
CATH FOLEY 2WAY SLVR  5CC 16FR (CATHETERS) ×4
CATH FOLEY 2WAY SLVR 5CC 14FR (CATHETERS) ×4 IMPLANT
CATH FOLEY 2WAY SLVR 5CC 16FR (CATHETERS) IMPLANT
CATH HEART VENT LEFT (CATHETERS) ×4 IMPLANT
CATH ROBINSON RED A/P 18FR (CATHETERS) ×23 IMPLANT
CATH THORACIC 28FR (CATHETERS) ×5 IMPLANT
CATH THORACIC 36FR (CATHETERS) ×5 IMPLANT
CATH THORACIC 36FR RT ANG (CATHETERS) ×5 IMPLANT
CNTNR URN SCR LID CUP LEK RST (MISCELLANEOUS) ×4 IMPLANT
CONN ST 1/4X3/8  BEN (MISCELLANEOUS)
CONN ST 1/4X3/8 BEN (MISCELLANEOUS) IMPLANT
CONT SPEC 4OZ STRL OR WHT (MISCELLANEOUS) ×4
CONTAINER PROTECT SURGISLUSH (MISCELLANEOUS) ×10 IMPLANT
DEVICE SUT CK QUICK LOAD MINI (Prosthesis & Implant Heart) ×1 IMPLANT
DRAIN CHANNEL 28F RND 3/8 FF (WOUND CARE) IMPLANT
DRAIN CHANNEL 32F RND 10.7 FF (WOUND CARE) IMPLANT
DRAPE CARDIOVASCULAR INCISE (DRAPES) ×4
DRAPE INCISE IOBAN 66X45 STRL (DRAPES) ×9 IMPLANT
DRAPE SRG 135X102X78XABS (DRAPES) ×4 IMPLANT
DRAPE TABLE BACK 80X90 (DRAPES) ×1 IMPLANT
DRAPE WARM FLUID 44X44 (DRAPES) ×9 IMPLANT
DRSG COVADERM 4X14 (GAUZE/BANDAGES/DRESSINGS) ×9 IMPLANT
ELECT BLADE 6.5 EXT (BLADE) ×5 IMPLANT
ELECT CAUTERY BLADE 6.4 (BLADE) ×9 IMPLANT
ELECT REM PT RETURN 9FT ADLT (ELECTROSURGICAL) ×8
ELECTRODE REM PT RTRN 9FT ADLT (ELECTROSURGICAL) ×20 IMPLANT
FELT TEFLON 1X6 (MISCELLANEOUS) ×11 IMPLANT
GAUZE 4X4 16PLY ~~LOC~~+RFID DBL (SPONGE) ×9 IMPLANT
GAUZE SPONGE 4X4 12PLY STRL (GAUZE/BANDAGES/DRESSINGS) ×18 IMPLANT
GLOVE SURG ENC MOIS LTX SZ6.5 (GLOVE) IMPLANT
GLOVE SURG ENC MOIS LTX SZ7 (GLOVE) IMPLANT
GLOVE SURG ENC MOIS LTX SZ7.5 (GLOVE) IMPLANT
GLOVE SURG MICRO LTX SZ7 (GLOVE) ×10 IMPLANT
GLOVE SURG UNDER POLY LF SZ6 (GLOVE) IMPLANT
GLOVE SURG UNDER POLY LF SZ6.5 (GLOVE) ×2 IMPLANT
GLOVE SURG UNDER POLY LF SZ7 (GLOVE) IMPLANT
GOWN STRL REUS W/ TWL LRG LVL3 (GOWN DISPOSABLE) ×32 IMPLANT
GOWN STRL REUS W/ TWL XL LVL3 (GOWN DISPOSABLE) ×12 IMPLANT
GOWN STRL REUS W/TWL LRG LVL3 (GOWN DISPOSABLE) ×24
GOWN STRL REUS W/TWL XL LVL3 (GOWN DISPOSABLE) ×12
HEMOSTAT POWDER SURGIFOAM 1G (HEMOSTASIS) ×31 IMPLANT
HEMOSTAT SURGICEL 2X14 (HEMOSTASIS) ×17 IMPLANT
INSERT FOGARTY XLG (MISCELLANEOUS) IMPLANT
KIT BASIN OR (CUSTOM PROCEDURE TRAY) ×9 IMPLANT
KIT CATH CPB BARTLE (MISCELLANEOUS) ×5 IMPLANT
KIT LVAD HEARTMATE 3 W-CNTRL (Prosthesis & Implant Heart) IMPLANT
KIT LVAD HEARTMATE III W-CNTRL (Prosthesis & Implant Heart) ×1 IMPLANT
KIT SUCTION CATH 14FR (SUCTIONS) ×9 IMPLANT
KIT SUT CK MINI COMBO 4X17 (Prosthesis & Implant Heart) ×1 IMPLANT
KIT TURNOVER KIT B (KITS) ×9 IMPLANT
KIT VASOVIEW HEMOPRO 2 VH 4000 (KITS) ×5 IMPLANT
LEAD PACING MYOCARDI (MISCELLANEOUS) ×2 IMPLANT
LINE VENT (MISCELLANEOUS) ×1 IMPLANT
NS IRRIG 1000ML POUR BTL (IV SOLUTION) ×49 IMPLANT
PACK E OPEN HEART (SUTURE) ×5 IMPLANT
PACK OPEN HEART (CUSTOM PROCEDURE TRAY) ×9 IMPLANT
PAD ARMBOARD 7.5X6 YLW CONV (MISCELLANEOUS) ×18 IMPLANT
PAD DEFIB R2 (MISCELLANEOUS) ×5 IMPLANT
PAD ELECT DEFIB RADIOL ZOLL (MISCELLANEOUS) ×5 IMPLANT
PENCIL BUTTON HOLSTER BLD 10FT (ELECTRODE) ×5 IMPLANT
POSITIONER HEAD DONUT 9IN (MISCELLANEOUS) ×9 IMPLANT
PUNCH AORTIC ROTATE 4.0MM (MISCELLANEOUS) IMPLANT
PUNCH AORTIC ROTATE 4.5MM 8IN (MISCELLANEOUS) ×9 IMPLANT
SEALANT SURG COSEAL 8ML (VASCULAR PRODUCTS) ×5 IMPLANT
SET MPS 3-ND DEL (MISCELLANEOUS) ×1 IMPLANT
SHEATH AVANTI 11CM 5FR (SHEATH) IMPLANT
SPONGE T-LAP 18X18 ~~LOC~~+RFID (SPONGE) ×39 IMPLANT
SPONGE T-LAP 4X18 ~~LOC~~+RFID (SPONGE) ×16 IMPLANT
SUT BONE WAX W31G (SUTURE) ×5 IMPLANT
SUT EB EXC GRN/WHT 2-0 V-5 (SUTURE) ×10 IMPLANT
SUT ETHIBON EXCEL 2-0 V-5 (SUTURE) ×1 IMPLANT
SUT ETHIBOND 2 0 SH (SUTURE) ×4
SUT ETHIBOND 2 0 SH 36X2 (SUTURE) IMPLANT
SUT ETHIBOND NAB MH 2-0 36IN (SUTURE) ×60 IMPLANT
SUT ETHIBOND V-5 VALVE (SUTURE) ×2 IMPLANT
SUT ETHILON 3 0 FSL (SUTURE) ×1 IMPLANT
SUT MNCRL AB 4-0 PS2 18 (SUTURE) ×1 IMPLANT
SUT PROLENE 3 0 SH DA (SUTURE) ×2 IMPLANT
SUT PROLENE 3 0 SH1 36 (SUTURE) ×5 IMPLANT
SUT PROLENE 4 0 RB 1 (SUTURE) ×48
SUT PROLENE 4 0 SH DA (SUTURE) IMPLANT
SUT PROLENE 4-0 RB1 .5 CRCL 36 (SUTURE) ×88 IMPLANT
SUT PROLENE 5 0 C1 (SUTURE) IMPLANT
SUT PROLENE 6 0 C 1 30 (SUTURE) IMPLANT
SUT PROLENE 7 0 BV 1 (SUTURE) ×3 IMPLANT
SUT SILK  1 MH (SUTURE) ×8
SUT SILK 1 MH (SUTURE) IMPLANT
SUT SILK 2 0 SH CR/8 (SUTURE) ×1 IMPLANT
SUT STEEL 6MS V (SUTURE) ×10 IMPLANT
SUT STEEL STERNAL CCS#1 18IN (SUTURE) IMPLANT
SUT STEEL SZ 6 DBL 3X14 BALL (SUTURE) ×5 IMPLANT
SUT TEM PAC WIRE 2 0 SH (SUTURE) IMPLANT
SUT VIC AB 1 CTX 36 (SUTURE) ×8
SUT VIC AB 1 CTX36XBRD ANBCTR (SUTURE) ×16 IMPLANT
SUT VIC AB 2-0 CT1 27 (SUTURE) ×8
SUT VIC AB 2-0 CT1 TAPERPNT 27 (SUTURE) IMPLANT
SUT VIC AB 3-0 SH 27 (SUTURE) ×4
SUT VIC AB 3-0 SH 27X BRD (SUTURE) IMPLANT
SUT VIC AB 3-0 SH 8-18 (SUTURE) ×5 IMPLANT
SUT VIC AB 3-0 X1 27 (SUTURE) ×1 IMPLANT
SYSTEM SAHARA CHEST DRAIN ATS (WOUND CARE) ×10 IMPLANT
TAPE CLOTH SURG 4X10 WHT LF (GAUZE/BANDAGES/DRESSINGS) ×2 IMPLANT
TAPE PAPER 2X10 WHT MICROPORE (GAUZE/BANDAGES/DRESSINGS) ×1 IMPLANT
TOWEL GREEN STERILE (TOWEL DISPOSABLE) ×9 IMPLANT
TOWEL GREEN STERILE FF (TOWEL DISPOSABLE) ×5 IMPLANT
TRAY FOLEY SLVR 16FR TEMP STAT (SET/KITS/TRAYS/PACK) ×9 IMPLANT
TUBE CONNECTING 12X1/4 (SUCTIONS) ×5 IMPLANT
TUBING LAP HI FLOW INSUFFLATIO (TUBING) ×5 IMPLANT
UNDERPAD 30X36 HEAVY ABSORB (UNDERPADS AND DIAPERS) ×9 IMPLANT
VALVE AORTIC SZ25 INSP/RESIL (Prosthesis & Implant Heart) ×1 IMPLANT
VENT LEFT HEART 12002 (CATHETERS) ×4
WATER STERILE IRR 1000ML POUR (IV SOLUTION) ×18 IMPLANT
YANKAUER SUCT BULB TIP NO VENT (SUCTIONS) ×5 IMPLANT

## 2022-02-22 NOTE — Anesthesia Preprocedure Evaluation (Signed)
Anesthesia Evaluation  ?Patient identified by MRN, date of birth, ID band ?Patient awake ? ? ? ?Reviewed: ?Allergy & Precautions, NPO status , Patient's Chart, lab work & pertinent test results ? ?History of Anesthesia Complications ?Negative for: history of anesthetic complications ? ?Airway ?Mallampati: III ? ?TM Distance: >3 FB ?Neck ROM: Full ? ? ?Comment: Bleeding and clots from recent dental surgery noted Dental ?  ?Pulmonary ?shortness of breath, sleep apnea , neg COPD, Current Smoker and Patient abstained from smoking.,  ?  ? ?+ decreased breath sounds ? ? ? ? ? Cardiovascular ?hypertension, Pt. on medications and Pt. on home beta blockers ?+ CAD and +CHF  ?+ Valvular Problems/Murmurs AI and MR  ?Rhythm:Regular Rate:Tachycardia ? ?1. Left ventricular ejection fraction, by estimation, is <20%. The left  ?ventricle has severely decreased function. The left ventricle has no  ?regional wall motion abnormalities. The left ventricular internal cavity  ?size was severely dilated. Left  ?ventricular diastolic parameters are indeterminate. There is the  ?interventricular septum is flattened in systole and diastole, consistent  ?with right ventricular pressure and volume overload.  ??2. Right ventricular systolic function is severely reduced. The right  ?ventricular size is moderately enlarged. There is moderately elevated  ?pulmonary artery systolic pressure.  ??3. Left atrial size was moderately dilated.  ??4. Right atrial size was moderately dilated.  ??5. The mitral valve is normal in structure. Mild mitral valve  ?regurgitation. No evidence of mitral stenosis.  ??6. Tricuspid valve regurgitation is mild to moderate.  ??7. The aortic valve was not well visualized. Aortic valve regurgitation  ?is moderate to severe. No aortic stenosis is present.  ??8. The inferior vena cava is dilated in size with <50% respiratory  ?variability, suggesting right atrial pressure of 15 mmHg.  ?   ?Neuro/Psych ?PSYCHIATRIC DISORDERS Anxiety negative neurological ROS ?   ? GI/Hepatic ?negative GI ROS, Neg liver ROS,   ?Endo/Other  ?negative endocrine ROSLab Results ?     Component                Value               Date                 ?     HGBA1C                   6.0 (H)             02/21/2022           ? ? Renal/GU ?Lab Results ?     Component                Value               Date                 ?     CREATININE               0.80                02/21/2022           ? ?Lab Results ?     Component                Value               Date                 ?  K                        5.0                 02/24/2022           ?  ? ?  ?Musculoskeletal ?negative musculoskeletal ROS ?(+)  ? Abdominal ?  ?Peds ? Hematology ?negative hematology ROS ?(+)   ?Anesthesia Other Findings ? ? Reproductive/Obstetrics ? ?  ? ? ? ? ? ? ? ? ? ? ? ? ? ?  ?  ? ? ? ? ? ? ? ? ?Anesthesia Physical ?Anesthesia Plan ? ?ASA: 4 ? ?Anesthesia Plan: General  ? ?Post-op Pain Management:   ? ?Induction: Intravenous ? ?PONV Risk Score and Plan: 1 and Ondansetron ? ?Airway Management Planned: Oral ETT ? ?Additional Equipment: Arterial line, CVP, PA Cath, TEE and Ultrasound Guidance Line Placement ? ?Intra-op Plan:  ? ?Post-operative Plan: Post-operative intubation/ventilation ? ?Informed Consent: I have reviewed the patients History and Physical, chart, labs and discussed the procedure including the risks, benefits and alternatives for the proposed anesthesia with the patient or authorized representative who has indicated his/her understanding and acceptance.  ? ? ? ?Dental advisory given ? ?Plan Discussed with: CRNA, Anesthesiologist and Surgeon ? ?Anesthesia Plan Comments:   ? ? ? ? ? ? ?Anesthesia Quick Evaluation ? ?

## 2022-02-22 NOTE — Transfer of Care (Signed)
Immediate Anesthesia Transfer of Care Note ? ?Patient: Colin Walton ? ?Procedure(s) Performed: INSERTION OF IMPLANTABLE LEFT VENTRICULAR ASSIST DEVICE USING THORATEC HEARTMATE 3 (Chest) ?AORTIC VALVE REPAIR USING INSPIRIS RESILIA  AORTIC VALVE (Chest) ?CORONARY ARTERY BYPASS GRAFTING (CABG) TIMES ONE, USING ENDOSCOPICALLY HARVESTED RIGHT GREATER SAPHENOUS VEIN (Chest) ?TRANSESOPHAGEAL ECHOCARDIOGRAM (TEE) ?ENDOVEIN HARVEST OF GREATER SAPHENOUS VEIN (Right: Leg Lower) ? ?Patient Location: ICU ? ?Anesthesia Type:General ? ?Level of Consciousness: sedated, unresponsive and Patient remains intubated per anesthesia plan ? ?Airway & Oxygen Therapy: Patient remains intubated per anesthesia plan and Patient placed on Ventilator (see vital sign flow sheet for setting) ? ?Post-op Assessment: Report given to RN and Post -op Vital signs reviewed and stable ? ?Post vital signs: Reviewed and stable ? ?Last Vitals:  ?Vitals Value Taken Time  ?BP 93/70 03/11/2022 1822  ?Temp    ?Pulse    ?Resp 18 02/28/2022 1827  ?SpO2    ?Vitals shown include unvalidated device data. ? ?Last Pain:  ?Vitals:  ? 03/21/2022 0500  ?TempSrc:   ?PainSc: 0-No pain  ?   ? ?Patients Stated Pain Goal: 2 (03/17/2022 0000) ? ?Complications: No notable events documented. ?

## 2022-02-22 NOTE — Anesthesia Procedure Notes (Signed)
Procedure Name: Intubation ?Date/Time: 02/27/2022 8:19 AM ?Performed by: Colin Benton, CRNA ?Pre-anesthesia Checklist: Patient identified, Emergency Drugs available, Suction available and Patient being monitored ?Patient Re-evaluated:Patient Re-evaluated prior to induction ?Oxygen Delivery Method: Circle system utilized ?Preoxygenation: Pre-oxygenation with 100% oxygen ?Induction Type: IV induction and Rapid sequence ?Laryngoscope Size: Glidescope and 4 ?Grade View: Grade I ?Tube type: Oral ?Tube size: 8.0 mm ?Number of attempts: 1 ?Airway Equipment and Method: Rigid stylet and Video-laryngoscopy ?Placement Confirmation: ETT inserted through vocal cords under direct vision, positive ETCO2 and breath sounds checked- equal and bilateral ?Secured at: 24 cm ?Tube secured with: Tape ?Dental Injury: Teeth and Oropharynx as per pre-operative assessment  ?Difficulty Due To: Difficulty was anticipated and Difficult Airway- due to large tongue ?Comments: Recent dental surgery and bleeding noted in mouth.  Decision to proceed with glidescope intubation.   ? ? ? ? ?

## 2022-02-22 NOTE — Anesthesia Procedure Notes (Signed)
Arterial Line Insertion ?Start/End03/23/2023 7:00 AM, 02/22/2022 7:05 AM ?Performed by: Waynard Edwards, CRNA, CRNA ? Patient location: Pre-op. ?Preanesthetic checklist: patient identified, IV checked, site marked, risks and benefits discussed, surgical consent, monitors and equipment checked, pre-op evaluation, timeout performed and anesthesia consent ?Lidocaine 1% used for infiltration ?Left, radial was placed ?Catheter size: 20 G ?Hand hygiene performed , maximum sterile barriers used  and Seldinger technique used ?Allen's test indicative of satisfactory collateral circulation ?Attempts: 1 ?Procedure performed without using ultrasound guided technique. ?Following insertion, dressing applied and Biopatch. ?Post procedure assessment: unchanged and normal ? ?Patient tolerated the procedure well with no immediate complications. ? ? ?

## 2022-02-22 NOTE — Interval H&P Note (Signed)
History and Physical Interval Note: ? ?02/25/2022 ?7:00 AM ? ?Colin Walton  has presented today for surgery, with the diagnosis of CHF ?AI ?CAD.  The various methods of treatment have been discussed with the patient and family. After consideration of risks, benefits and other options for treatment, the patient has consented to  Procedure(s) with comments: ?INSERTION OF IMPLANTABLE LEFT VENTRICULAR ASSIST DEVICE (N/A) - HM3 ?AORTIC VALVE REPAIR OR REPLACEMENT (N/A) ?CORONARY ARTERY BYPASS GRAFTING (CABG) (N/A) - x 1 ?TRANSESOPHAGEAL ECHOCARDIOGRAM (TEE) (N/A) as a surgical intervention.  The patient's history has been reviewed, patient examined, no change in status, stable for surgery.  I have reviewed the patient's chart and labs.  Questions were answered to the patient's satisfaction.   ? ? ?Colin Walton ? ? ?

## 2022-02-22 NOTE — Progress Notes (Signed)
Patient ID: Colin Walton, male   DOB: [birth year], 51 y.o.   MRN: [id number]     Advanced Heart Failure Rounding Note  PCP-Cardiologist: Lauree Chandler, MD   Subjective:    2/17 admitted for a/c Biventricular HF, NYHA IIIb-IV symptoms, w/ low output. Stated on milrinone. LVAD work up initiated.  2/23 RHC/LHC on milrinone 0.125 mcg + Norepi 2 mcg w/ 2v CAD, Severe NICM EF < 20%, Elevated filling pressures with markedly reduced CO with severe AI 2/28 diuretics held for AKI  3/1 Multiple teeth extractions  3/3 HM-3 VAD placed with AVR and SVG (off outflow graft) to RCA  He is just back from OR after VAD placement.   Complicated OR case with multiple episodes VT/VF. Persistently coagulopathic.  Remains on vent.   Now on epi, NE, milrinone, VP and inhaled iNO.   MAP 84.   CVP 20 Themo 6.0/2.8  LVAD Interrogation HM 3: Speed: 5600 Flow: 4.9 PI: 25 Power: 4.2    Objective:   Weight Range: 105.8 kg Body mass index is 36.53 kg/m.   Vital Signs:   Temp:  [98.2 F (36.8 C)-98.3 F (36.8 C)] 98.2 F (36.8 C) (03/03 0400) Pulse Rate:  [93-119] 118 (03/03 0727) Resp:  [9-52] 24 (03/03 0727) BP: (130-170)/(63-125) 170/125 (03/03 0727) SpO2:  [94 %-100 %] 96 % (03/03 0727) FiO2 (%):  [100 %] 100 % (03/03 1830) Weight:  [105.8 kg] 105.8 kg (03/03 0433) Last BM Date : 01/28/2022  Weight change: Filed Weights   03/03/2022 0500 02/21/22 0600 03/13/2022 0433  Weight: 105.4 kg 105.7 kg 105.8 kg    Intake/Output:   Intake/Output Summary (Last 24 hours) at 03/14/2022 1841 Last data filed at 03/01/2022 1830 Gross per 24 hour  Intake 7992.89 ml  Output 2585 ml  Net 5407.89 ml     Physical Exam   General:  Intubated/sedated HEENT: + ETT edematous  Neck: supple. RIJ swan Cor: LVAD hum. Sternal dressing. +CTs  Lungs: Coarse Abdomen: obese soft, nontender, + distended. quiet bowel sounds. Driveline site dressing clean. Anchor in place.  Extremities: no cyanosis, clubbing,  rash. 1-2+ edema RLE wrapped Neuro: sedated   Telemetry   Sinus 110-120 Personally reviewed   Labs    CBC Recent Labs    02/21/22 0413 02/27/2022 0415 03/11/2022 0840 02/21/2022 1109 03/11/2022 1112 03/09/2022 1620 03/12/2022 1623  WBC 7.1 5.8  --   --   --   --   --   HGB 16.2 15.9   < > 13.4   < > 10.5* 11.2*  HCT 52.8* 51.0   < > 42.1   < > 31.0* 33.0*  MCV 94.5 93.2  --   --   --   --   --   PLT 243 227  --  202  --   --   --    < > = values in this interval not displayed.    Basic Metabolic Panel Recent Labs    02/21/22 0413 03/05/2022 0415 03/07/2022 0840 03/09/2022 1331 03/08/2022 1335 03/07/2022 1620 03/13/2022 1623  NA 137 135   < > 137   < > 139 139  K 5.1 5.1   < > 4.6   < > 4.2 4.2  CL 99 96*   < > 99  --   --  101  CO2 30 32  --   --   --   --   --   GLUCOSE 174* 133*   < > 125*  --   --  139*  BUN 22* 16   < > 18  --   --  14  CREATININE 1.09 0.96   < > 0.80  --   --  0.80  CALCIUM 9.3 9.5  --   --   --   --   --    < > = values in this interval not displayed.    Liver Function Tests No results for input(s): AST, ALT, ALKPHOS, BILITOT, PROT, ALBUMIN in the last 72 hours.   No results for input(s): LIPASE, AMYLASE in the last 72 hours. Cardiac Enzymes No results for input(s): CKTOTAL, CKMB, CKMBINDEX, TROPONINI in the last 72 hours.  BNP: BNP (last 3 results) Recent Labs    03/22/21 1106 02/19/2022 1649 02/11/22 0345  BNP 995.8* 1,303.2* 166.8*     ProBNP (last 3 results) No results for input(s): PROBNP in the last 8760 hours.   D-Dimer No results for input(s): DDIMER in the last 72 hours. Hemoglobin A1C Recent Labs    02/21/22 0413  HGBA1C 6.0*     Fasting Lipid Panel No results for input(s): CHOL, HDL, LDLCALC, TRIG, CHOLHDL, LDLDIRECT in the last 72 hours.  Thyroid Function Tests No results for input(s): TSH, T4TOTAL, T3FREE, THYROIDAB in the last 72 hours.  Invalid input(s): FREET3   Other results:   Imaging    No results  found.   Medications:     Scheduled Medications:  (feeding supplement) PROSource Plus  30 mL Oral BID BM   sodium chloride   Intravenous Once   sodium chloride   Intravenous Once   [START ON 02/23/2022] acetaminophen  1,000 mg Oral Q6H   Or   [START ON 02/23/2022] acetaminophen (TYLENOL) oral liquid 160 mg/5 mL  1,000 mg Per Tube Q6H   allopurinol  300 mg Oral Daily   [START ON 02/23/2022] aspirin EC  325 mg Oral Daily   Or   [START ON 02/23/2022] aspirin  324 mg Per Tube Daily   Or   [START ON 02/23/2022] aspirin  300 mg Rectal Daily   atorvastatin  80 mg Oral Daily   [START ON 02/23/2022] bisacodyl  10 mg Oral Daily   Or   [START ON 02/23/2022] bisacodyl  10 mg Rectal Daily   chlorhexidine  15 mL Mouth Rinse BID   chlorhexidine  15 mL Mouth/Throat NOW   coagulation factor VIIa recomb  5,000 mcg Intravenous STAT   [START ON 02/23/2022] docusate sodium  200 mg Oral Daily   lactose free nutrition  237 mL Oral TID WC   [START ON 02/24/2022] pantoprazole  40 mg Oral Daily   [START ON 02/23/2022] sodium chloride flush  3 mL Intravenous Q12H    Infusions:  sodium chloride     [START ON 02/23/2022] sodium chloride     sodium chloride     albumin human      ceFAZolin (ANCEF) IV     dexmedetomidine (PRECEDEX) IV infusion     epinephrine     famotidine (PEPCID) IV     [START ON 02/23/2022] fluconazole (DIFLUCAN) IV     insulin     lactated ringers     lactated ringers     magnesium sulfate     milrinone Stopped (03/02/2022 0737)   norepinephrine (LEVOPHED) Adult infusion     potassium chloride     [START ON 02/23/2022] rifampin (RIFADIN) IVPB     vancomycin      PRN Medications: sodium chloride, albumin human, dextrose, midazolam, morphine injection, ondansetron (ZOFRAN)  IV, oxyCODONE, [START ON 02/23/2022] sodium chloride flush, traMADol   Assessment/Plan   1. Acute on Chronic Biventricular Heart Failure-> cardiogenic shock - cMRI 8/21 EF 24% Moderate AI. No LGE or infiltrative process. - Echo  01/23/2022 EF < 20% with mild LV dilation, RV severely HK with moderate dilation and D-shaped septum, AI moderate to severe.  - Suspect CM related to HTN +/- ETOH. AI may be contributing but  - [facility] 02/14/22 3v CAD/ RHC with elevated filling pressures.  - Not transplant candidate due to smoking and lung disease - 3/3 HM-3 VAD placed with AVR and SVG (off outflow graft) to RCA - On EPi, NE, VP, milrinone and iNO.Adjust as needed - Keep intubated overnight. Suspect slow vent wean  - Will need eventual diuresis  2. HM-3 VAD - VAD interrogated personally. Parameters stable. - no AC for now  3. Acute hypoxic/post-op respiratory failure - significant underlying COPD - wean vent as tolerated  4. Acute coagulopathy - given multiple units of product in OR + 1/2 dose Factor 7 - follow closely - transfuse as needed  5. Moderate-severe aortic regurgitation - Moderate to severe AI on echo 02/02/2022.  Suspect AI is not cause of cardiomyopathy but is likely making symptoms worse.  - Plan AVR at time of VAD  6. CAD - cath 3v CAD LAD 40% D1 90%, OM 90% mRCA 80% - SVG from outflow graft to RCA at time of VAD  7. VT/VF - continue IV amio - Keep K >4.0 Mg > 2,0    8. OSA on CPAP - on CPAP.  CRITICAL CARE Performed by: [name]  Total critical care time: 55 minutes  Critical care time was exclusive of separately billable procedures and treating other patients.  Critical care was necessary to treat or prevent imminent or life-threatening deterioration.  Critical care was time spent personally by me (independent of midlevel providers or residents) on the following activities: development of treatment plan with patient and/or surrogate as well as nursing, discussions with consultants, evaluation of patient's response to treatment, examination of patient, obtaining history from patient or surrogate, ordering and performing treatments and interventions, ordering and review of laboratory  studies, ordering and review of radiographic studies, pulse oximetry and re-evaluation of patient's condition.  Length of Stay: Budd [place], MD  03/09/2022, 6:41 PM  Advanced Heart Failure Team Pager [phone] (M-F; 7a - 5p)  Please contact [facility] for night-coverage after hours (5p -7a ) and weekends on [email or web address]   .

## 2022-02-22 NOTE — Brief Op Note (Signed)
02/16/2022 - 03/22/2022 ? ?10:50 AM ? ?PATIENT:  Colin Walton  51 y.o. male ? ?PRE-OPERATIVE DIAGNOSIS:  Congestive Heart Failure ?Aortic Insufficiency ?CAD ? ?POST-OPERATIVE DIAGNOSIS:  Congestive Heart Failure ?Aortic Insufficiency ? ?PROCEDURE:  Procedure(s): ? ?INSERTION OF IMPLANTABLE LEFT VENTRICULAR ASSIST DEVICE USING THORATEC HEARTMATE 3 (N/A) ? ?AORTIC VALVE REPAIR OR REPLACEMENT USING 25MM INSPIRIS RESILIA  AORTIC VALVE (N/A) ? ?CORONARY ARTERY BYPASS GRAFTING x 1 ?-SVG to PDA ? ?ENDOSCOPIC HARVEST GREATER SAPHENOUS VEIN ?-Right Thigh (15/10 min) ? ?TRANSESOPHAGEAL ECHOCARDIOGRAM (TEE) (N/A) ? ? ?SURGEON:  Surgeon(s) and Role: ?   * Bartle, Fernande Boyden, MD - Primary ? ?PHYSICIAN ASSISTANT: Ellwood Handler PA-C ? ?ASSISTANTS: Ara Kussmaul RNFA  ? ?ANESTHESIA:   general ? ?EBL: Per Anesthesia Record ? ?BLOOD ADMINISTERED:   CC CELLSAVER ? ?DRAINS:  Left and right pleural chest tubes, mediastinal chest drains   ? ?LOCAL MEDICATIONS USED:  NONE ? ?SPECIMEN:  Source of Specimen:  Aortic Valve Leaflets, Core of LV ? ?DISPOSITION OF SPECIMEN:  PATHOLOGY ? ?COUNTS:  YES ? ?TOURNIQUET:  * No tourniquets in log * ? ?DICTATION: .Dragon Dictation ? ?PLAN OF CARE:  return ICU ? ?PATIENT DISPOSITION:  ICU - intubated and critically ill. ?  ?Delay start of Pharmacological VTE agent (>24hrs) due to surgical blood loss or risk of bleeding: no ? ?

## 2022-02-22 NOTE — Progress Notes (Signed)
CSW met with patient's mother and family in the waiting area. Mom reports she has received updates form the OR and feeling good about the surgery. CSW discussed what to expect over the next few hours and visitation. Patient's mom appears in good spirits and feels supported by the team. CSW provided supportive intervention and will continue to follow throughout implant hospitalization. Raquel Sarna, [place], [place] ? ?

## 2022-02-22 NOTE — Progress Notes (Signed)
CSW met at bedside with patient and Mom. Patient states that he is feeling a bit improved from yesterdays oral surgery. CSW discussed implant scheduled for tomorrow and patient reports that he is ready to "get on with it". Patient and Mom both appear motivated for improved health and feeling positive about recovery. CSW provided supportive intervention and will continue to follow throughout implant hospitalization. Raquel Sarna, [place], [place] ? ?

## 2022-02-22 NOTE — Progress Notes (Signed)
Pharmacy Antibiotic Note ? ?Colin Walton is a 51 y.o. male admitted on 02/07/2022. Pharmacy has been consulted for vancomycin dosing VAD surgical prophylaxis. Vancomycin 1500mg  given in procedure.  ? ?Plan: ?Vancomycin 1000mg  q12h (e415 using scr 0.8 and 0.5 for Vd) x 48 hrs  ?Monitor renal function, cultures, and clinical progression ? ? ?Height: 5\' 7"  (170.2 cm) ?Weight: 105.8 kg (233 lb 4 oz) ?IBW/kg (Calculated) : 66.1 ? ?Temp (24hrs), Avg:98.2 ?F (36.8 ?C), Min:98.2 ?F (36.8 ?C), Max:98.3 ?F (36.8 ?C) ? ?Recent Labs  ?Lab 02/18/22 ?0510 01/24/2022 ? 03/06/2022 ?0423 02/21/22 ?0413 03/14/2022 ?04/22/22 03/16/2022 ?04/24/22 02/21/2022 ?1112 03/13/2022 ?1205 03/04/2022 ?1256 02/25/2022 ?1331 03/19/2022 ?1623  ?WBC 7.9 9.2 8.3 7.1 5.8  --   --   --   --   --   --   ?CREATININE 1.35* 1.42* 1.25* 1.09 0.96   < > 0.80 0.80 0.80 0.80 0.80  ? < > = values in this interval not displayed.  ?  ?Estimated Creatinine Clearance: 129.5 mL/min (by C-G formula based on SCr of 0.8 mg/dL).   ? ?No Known Allergies ? ?Antimicrobials this admission: ? ?Dose adjustments this admission: ? ?Microbiology results: ? ? ?Thank you for allowing pharmacy to be a part of this patient?s care. ? ?04/24/22, PharmD, BCPS ?Clinical Pharmacist ?03/14/2022 6:58 PM ? ? ?

## 2022-02-22 NOTE — Anesthesia Procedure Notes (Signed)
Central Venous Catheter Insertion ?Performed by: Oleta Mouse, MD, anesthesiologist ?Start/End03/01/2022 7:10 AM, 02/25/2022 7:17 AM ?Patient location: Pre-op. ?Preanesthetic checklist: patient identified, IV checked, site marked, risks and benefits discussed, surgical consent, monitors and equipment checked, pre-op evaluation, timeout performed and anesthesia consent ?Position: supine ?Lidocaine 1% used for infiltration and patient sedated ?Hand hygiene performed  and maximum sterile barriers used  ?Catheter size: 9 Fr ?Total catheter length 10. ?Central line was placed.MAC introducer ?Swan type:thermodilution ?Procedure performed using ultrasound guided technique. ?Ultrasound Notes:anatomy identified, needle tip was noted to be adjacent to the nerve/plexus identified, no ultrasound evidence of intravascular and/or intraneural injection and image(s) printed for medical record ?Attempts: 1 ?Following insertion, line sutured, dressing applied and Biopatch. ?Post procedure assessment: blood return through all ports, free fluid flow and no air ? ?Patient tolerated the procedure well with no immediate complications. ? ? ? ? ?

## 2022-02-22 NOTE — Progress Notes (Signed)
?  Echocardiogram ?Echocardiogram Transesophageal has been performed. ? ?Colin Walton ?03/11/2022, 9:44 AM ?

## 2022-02-22 NOTE — Anesthesia Procedure Notes (Signed)
Central Venous Catheter Insertion ?Performed by: Val Eagle, MD, anesthesiologist ?Start/End03/08/2022 7:10 AM, 02/22/2022 7:17 AM ?Patient location: Pre-op. ?Preanesthetic checklist: patient identified, IV checked, site marked, risks and benefits discussed, surgical consent, monitors and equipment checked, pre-op evaluation, timeout performed and anesthesia consent ?Hand hygiene performed  and maximum sterile barriers used  ?PA cath was placed.Swan type:thermodilution ?Procedure performed without using ultrasound guided technique. ?Attempts: 1 ?Patient tolerated the procedure well with no immediate complications. ? ? ? ?

## 2022-02-22 NOTE — Op Note (Signed)
CARDIOVASCULAR SURGERY OPERATIVE NOTE  Colin Walton [id number] 03/14/2022   Surgeon:  Gaye Pollack, MD  First Assistant: Ellwood Handler, PA-C : An experienced assistant was required given the complexity of this surgery and the standard of surgical care. The assistant was needed for endoscopic vein harvesting, exposure, dissection, suctioning, retraction of delicate tissues and sutures, instrument exchange and for overall help during this procedure.   Preoperative Diagnosis: Class IV heart failure with LVEF <20%   Postoperative Diagnosis:  Same   Procedure  Median Sternotomy Extracorporeal circulation Aortic valve replacement using a 25 mm Edwards INSPIRIS RESILIA pericardial valve Coronary artery bypass grafting x 1: SVG to PDA 5.   Implantation of HeartMate 3 Left Ventricular Assist Device.   Anesthesia:  General Endotracheal   Clinical History/Surgical Indication:  51 year old obese male with mixed ischemic, nonischemic cardiomyopathy admitted to the hospital for failure of medical therapy.  He was found to be in low output cardiogenic shock which responded to inotropic support and diuresis.    Cardiac catheterization demonstrates moderate coronary artery disease with high-grade stenosis of a right dominant RCA.  Echocardiogram shows severe LV dysfunction with LV dilatation greater than 6 cm and moderate RV dysfunction.  There is mild MR and TR but aortic valve regurgitation is moderate-severe. He has failed optimal medical management and it is felt that HM3 LVAD insertion as destination therapy is the best treatment for him.  The operative procedure has been discussed with the patient and family including alternatives, benefits and risks; including but not limited to bleeding, blood transfusion, infection, stroke, myocardial infarction, pump failure or thrombus possibly requiring replacement, RV dysfunction requiring an RVAD or long term milrinone therapy, heart block requiring a  permanent pacemaker, organ dysfunction, and death.  The patient understands and agrees to proceed.      Preparation:  The patient was seen in the preoperative holding area and the correct patient, correct operation were confirmed with the patient after reviewing the medical record and catheterization. The consent was signed by me. Preoperative antibiotics were given. A pulmonary arterial line and radial arterial line were placed by the anesthesia team. The patient was taken back to the operating room and positioned supine on the operating room table. After being placed under general endotracheal anesthesia by the anesthesia team a foley catheter was placed. The neck, chest, abdomen, and both legs were prepped with betadine soap and solution and draped in the usual sterile manner. A surgical time-out was taken and the correct patient and operative procedure were confirmed with the nursing and anesthesia staff.   Pre-bypass TEE:   Complete TEE assessment was performed by Dr. Laurie Panda. This showed severe LV systolic dysfunction with a markedly enlarged and dilated LV. There was moderate to severe AI, mild MR and mild TR. No PFO and negative bubble study. RV was mildly dilated and moderately hypokinetic.  Median sternotomy:    A median sternotomy was performed. The pericardium was opened in the midline. Right ventricular function appeared moderately depressed with mild distension. The ascending aorta was of normal size and had no palpable plaque. There were no contraindications to aortic cannulation or cross-clamping. The pericardium was opened along the left diaphragm out to the apex.  Then a 4 cm transverse incision was made to the left of the umbilicus and continued down to the rectus fascia using electrocautery. A skin punch was used to create the drive line exit site about 3 cm below the right costal margin in the anterior axillary line.  The tunneling device was passed in a subcutaneous plane from  the transverse abdominal incision to the pericardium. Another tunneling device was passed from the abdominal incision to the skin exit site.  Cardiopulmonary Bypass:   The patient was fully systemically heparinized and the ACT was maintained > 400 sec. The proximal aortic arch was cannulated with a 63F aortic cannula for arterial inflow. Venous cannulation was performed via the right atrial appendage using a two-staged venous cannula. Carbon dioxide was insufflated into the pericardium at 6L/min throughout the procedure to minimize intracardiac air. The systemic temperature was dropped to 32 degrees during the AVR and CABG and the patient was actively rewarmed during LVAD insertion. An antegrade cardioplegia/vent cannula was inserted into the mid-ascending aorta. A left ventricular vent was placed via the right superior pulmonary vein. A retrograde cardioplegia cannnula was placed into the coronary sinus via the right atrium. Aortic occlusion was performed with a single cross-clamp. 1500 cc cold KBC cardioplegia was used to induce diastolic arrest. A temperature probe was inserted into the interventricular septum and an insulating pad was placed in the pericardium. Carbon dioxide was insufflated into the pericardium at 5L/min throughout the procedure to minimize intracardiac air.   Endoscopic vein harvest:  The right greater saphenous vein was harvested endoscopically through a 2 cm incision medial to the right knee. It was harvested from the thigh.  It was a medium-sized vein of good quality. The side branches were all ligated with 4-0 silk ties.    Coronary arteries:  The coronary arteries were examined.  LAD:  not visible on the surface due to epicardial fat. LCX:  not visible on the surface due to epicardial fat. RCA:  diffusely diseased extending out into the proximal PDA. Beyond this the PDA was graftable.   Grafts:  1.SVG to PDA:  1.75 mm. It was sewn end to side using 7-0 prolene  continuous suture.  The proximal vein graft anastomosis was performed to the outflow graft using continuous 6-0 prolene suture. A graft marker was placed around the proximal anastomosis.    Aortic Valve Replacement:   A transverse aortotomy was performed 1 cm above the take-off of the right coronary artery. The native valve was tricuspid with thickened leaflets and no annular calcification. The ostia of the coronary arteries were in normal position and were not obstructed. The native valve leaflets were excised. Care was taken to remove all particulate debris. The left ventricle was directly inspected for debris and then irrigated with ice saline solution. The annulus was sized and a size 25 mm Edwards INSPIRIS RESILIA pericardial valve was chosen. The model number was [id number] and the serial number was [id number]. While the valve was being prepared 2-0 Ethibond pledgeted horizontal mattress sutures were placed around the annulus with the pledgets in a sub-annular position. The sutures were placed through the sewing ring and the valve lowered into place. The sutures were tied using Cor-Knots. The valve seated nicely and the coronary ostia were not obstructed. The prosthetic valve leaflets moved normally and there was no sub-valvular obstruction. The aortotomy was closed using 4-0 Prolene suture in 2 layers with felt strips to reinforce the closure. The cross-clamp was removed and the patient spontaneously returned to sinus rhythm.      Insertion of apical outflow cannula:  The patient was placed on cardiopulmonary bypass. Laparotomy pads were placed behind the heart to aid exposure of the apex. A site was chosen on the anterior wall lateral to the LAD and superior  to the true apex. A stab incision was made and a foley catheter placed through the coring knife and into the left ventricle. The balloon was inflated and with gentle traction on the catheter the coring knife was carefully advanced toward the mitral  valve to create the ventriculotomy. The balloon was deflated and removed. A sump was placed into the LV and the ventricular core excised and sent to pathology. Trabeculations that might cause obstruction were excised. Then a series of 2-0 Ethibond horizontal mattress sutures were placed around the ventriculotomy. This took 12 sutures. The sutures were placed through the apical cuff. The sutures were tied and the apical cuff appeared to be well-sealed to the apex. Then volume was left in the heart and the lungs inflated. The HeartMate 3 was brought onto the operative field. The inflow cannula was inserted into the apical cuff and the apical cuff lock engaged.   The drive line was then tunneled using the previously placed tunneling devices. The bend relief was fully engaged and tested with an axial pull. The heart and pump were returned to the pericardium and appeared to sit nicely.   Aortic outflow graft anastomosis:  The outflow graft was distended and positioned along the inferior margin of the heart and around the right atrium. The outflow graft was cut to the appropriate length. The ascending aorta was partially occluded with a Lemole clamp. A midline aortotomy was created and the ends enlarged with a 4.5 mm punch. The outflow graft was anastomosed to the aorta using 4-0 prolene pledgetted sutures at the toe and heal that were run down each side. Coseal was used to seal the needle holes in the graft. The Lemole clamp was removed and the graft clamped with a peripheral Debakey clamp and deaired using a 21 gauge needle. The aortic anastomosis was hemostatic.   Weaning from bypass:  The patient was started on Milrinone 0.5 mcg/kg/min, epinephrine 1 mcg/kg/min, levophed and nitric oxide at 20 ppm. The HeartMate 3 pump was started at 3000 rpm. TEE confirmed that the intracardiac air was removed.  Cardiopulmonary bypass flow was decreased to half flow and the clamp removed from the outflow graft. The speed was  gradually increased to 4000 rpm. Flow was decreased to 1/4 and the speed gradually increased to 4800 rpm. Cardiopulmonary bypass was discontinued and the speed increased to 5600 rpm.  Pulmonary artery pressures were normal with a CVP of 8. CO was 5 L/min. CPB time was 203 minutes. TEE showed excellent apical cannula position. There was trivial MR, trivial TR. RV function appeared moderately reduced.   Completion:  Two temporary epicardial pacing wires were placed on the right ventricle. Heparin was fully reversed with protamine and the aortic and venous cannulas removed. Hemostasis was achieved but took several hours due to coagulopathy. We gave him FFP, plts, and cryo and he was still bleeding from needle holes in the graft and aorta. TEG still showed a factor deficiency. I decided to give him a half dose of Factor VII ( 45 mcg/kg). This resulted in adequate hemostasis to close. Mediastinal drainage tubes and bilateral pleural chest tubes and a 43F Blake pocket drain were placed.  The sternum was closed with double #6 stainless steel wires. The fascia was closed with continuous # 1 vicryl suture. The subcutaneous tissue was closed with 2-0 vicryl continuous suture. The skin was closed with 3-0 vicryl subcuticular suture. The abdominal incision was closed with continuous 3-0 vicryl subcutaneous suture and 3-0 vicryl subcuticular skin suture.  The drive line exit site was re-approximated  3-0 nylon skin sutures. The two drive line fasteners were applied. All sponge, needle, and instrument counts were reported correct at the end of the case. Dry sterile dressings were placed over the incisions and around the chest tubes which were connected to pleurevac suction. The patient was then transported to the surgical intensive care unit in critical but stable condition.

## 2022-02-22 NOTE — OR Nursing (Signed)
1st call to SICU charge nurse Joni Reining. ?

## 2022-02-23 ENCOUNTER — Inpatient Hospital Stay (HOSPITAL_COMMUNITY): Payer: 59

## 2022-02-23 DIAGNOSIS — Z95811 Presence of heart assist device: Secondary | ICD-10-CM

## 2022-02-23 DIAGNOSIS — I5023 Acute on chronic systolic (congestive) heart failure: Secondary | ICD-10-CM | POA: Diagnosis not present

## 2022-02-23 LAB — POCT I-STAT 7, (LYTES, BLD GAS, ICA,H+H)
Acid-Base Excess: 5 mmol/L — ABNORMAL HIGH (ref 0.0–2.0)
Acid-Base Excess: 4 mmol/L — ABNORMAL HIGH (ref 0.0–2.0)
Acid-Base Excess: 0 mmol/L (ref 0.0–2.0)
Acid-Base Excess: 0 mmol/L (ref 0.0–2.0)
Acid-Base Excess: 0 mmol/L (ref 0.0–2.0)
Acid-base deficit: 1 mmol/L (ref 0.0–2.0)
Acid-base deficit: 1 mmol/L (ref 0.0–2.0)
Acid-base deficit: 1 mmol/L (ref 0.0–2.0)
Acid-base deficit: 2 mmol/L (ref 0.0–2.0)
Acid-base deficit: 2 mmol/L (ref 0.0–2.0)
Acid-base deficit: 2 mmol/L (ref 0.0–2.0)
Acid-base deficit: 4 mmol/L — ABNORMAL HIGH (ref 0.0–2.0)
Bicarbonate: 23.6 mmol/L (ref 20.0–28.0)
Bicarbonate: 24 mmol/L (ref 20.0–28.0)
Bicarbonate: 24.3 mmol/L (ref 20.0–28.0)
Bicarbonate: 24.6 mmol/L (ref 20.0–28.0)
Bicarbonate: 25.6 mmol/L (ref 20.0–28.0)
Bicarbonate: 25.7 mmol/L (ref 20.0–28.0)
Bicarbonate: 25.7 mmol/L (ref 20.0–28.0)
Bicarbonate: 26.1 mmol/L (ref 20.0–28.0)
Bicarbonate: 26.4 mmol/L (ref 20.0–28.0)
Bicarbonate: 27.6 mmol/L (ref 20.0–28.0)
Bicarbonate: 29 mmol/L — ABNORMAL HIGH (ref 20.0–28.0)
Bicarbonate: 29.6 mmol/L — ABNORMAL HIGH (ref 20.0–28.0)
Calcium, Ion: 1.06 mmol/L — ABNORMAL LOW (ref 1.15–1.40)
Calcium, Ion: 1.1 mmol/L — ABNORMAL LOW (ref 1.15–1.40)
Calcium, Ion: 1.2 mmol/L (ref 1.15–1.40)
Calcium, Ion: 1.2 mmol/L (ref 1.15–1.40)
Calcium, Ion: 1.21 mmol/L (ref 1.15–1.40)
Calcium, Ion: 1.22 mmol/L (ref 1.15–1.40)
Calcium, Ion: 1.23 mmol/L (ref 1.15–1.40)
Calcium, Ion: 1.24 mmol/L (ref 1.15–1.40)
Calcium, Ion: 1.25 mmol/L (ref 1.15–1.40)
Calcium, Ion: 1.26 mmol/L (ref 1.15–1.40)
Calcium, Ion: 1.26 mmol/L (ref 1.15–1.40)
Calcium, Ion: 1.27 mmol/L (ref 1.15–1.40)
HCT: 22 % — ABNORMAL LOW (ref 39.0–52.0)
HCT: 23 % — ABNORMAL LOW (ref 39.0–52.0)
HCT: 23 % — ABNORMAL LOW (ref 39.0–52.0)
HCT: 23 % — ABNORMAL LOW (ref 39.0–52.0)
HCT: 24 % — ABNORMAL LOW (ref 39.0–52.0)
HCT: 26 % — ABNORMAL LOW (ref 39.0–52.0)
HCT: 26 % — ABNORMAL LOW (ref 39.0–52.0)
HCT: 28 % — ABNORMAL LOW (ref 39.0–52.0)
HCT: 31 % — ABNORMAL LOW (ref 39.0–52.0)
HCT: 33 % — ABNORMAL LOW (ref 39.0–52.0)
HCT: 34 % — ABNORMAL LOW (ref 39.0–52.0)
HCT: 38 % — ABNORMAL LOW (ref 39.0–52.0)
Hemoglobin: 10.5 g/dL — ABNORMAL LOW (ref 13.0–17.0)
Hemoglobin: 11.2 g/dL — ABNORMAL LOW (ref 13.0–17.0)
Hemoglobin: 11.6 g/dL — ABNORMAL LOW (ref 13.0–17.0)
Hemoglobin: 12.9 g/dL — ABNORMAL LOW (ref 13.0–17.0)
Hemoglobin: 7.5 g/dL — ABNORMAL LOW (ref 13.0–17.0)
Hemoglobin: 7.8 g/dL — ABNORMAL LOW (ref 13.0–17.0)
Hemoglobin: 7.8 g/dL — ABNORMAL LOW (ref 13.0–17.0)
Hemoglobin: 7.8 g/dL — ABNORMAL LOW (ref 13.0–17.0)
Hemoglobin: 8.2 g/dL — ABNORMAL LOW (ref 13.0–17.0)
Hemoglobin: 8.8 g/dL — ABNORMAL LOW (ref 13.0–17.0)
Hemoglobin: 8.8 g/dL — ABNORMAL LOW (ref 13.0–17.0)
Hemoglobin: 9.5 g/dL — ABNORMAL LOW (ref 13.0–17.0)
O2 Saturation: 100 %
O2 Saturation: 100 %
O2 Saturation: 100 %
O2 Saturation: 87 %
O2 Saturation: 95 %
O2 Saturation: 96 %
O2 Saturation: 97 %
O2 Saturation: 98 %
O2 Saturation: 99 %
O2 Saturation: 99 %
O2 Saturation: 99 %
O2 Saturation: 99 %
Patient temperature: 37.7
Patient temperature: 37.7
Patient temperature: 37.7
Patient temperature: 37.8
Patient temperature: 37.8
Patient temperature: 37.9
Patient temperature: 38
Patient temperature: 38
Patient temperature: 38.4
Patient temperature: 38.7
Potassium: 4.1 mmol/L (ref 3.5–5.1)
Potassium: 4.8 mmol/L (ref 3.5–5.1)
Potassium: 5.1 mmol/L (ref 3.5–5.1)
Potassium: 5.2 mmol/L — ABNORMAL HIGH (ref 3.5–5.1)
Potassium: 5.3 mmol/L — ABNORMAL HIGH (ref 3.5–5.1)
Potassium: 5.4 mmol/L — ABNORMAL HIGH (ref 3.5–5.1)
Potassium: 5.4 mmol/L — ABNORMAL HIGH (ref 3.5–5.1)
Potassium: 5.4 mmol/L — ABNORMAL HIGH (ref 3.5–5.1)
Potassium: 5.4 mmol/L — ABNORMAL HIGH (ref 3.5–5.1)
Potassium: 5.6 mmol/L — ABNORMAL HIGH (ref 3.5–5.1)
Potassium: 5.6 mmol/L — ABNORMAL HIGH (ref 3.5–5.1)
Potassium: 5.7 mmol/L — ABNORMAL HIGH (ref 3.5–5.1)
Sodium: 131 mmol/L — ABNORMAL LOW (ref 135–145)
Sodium: 132 mmol/L — ABNORMAL LOW (ref 135–145)
Sodium: 132 mmol/L — ABNORMAL LOW (ref 135–145)
Sodium: 132 mmol/L — ABNORMAL LOW (ref 135–145)
Sodium: 132 mmol/L — ABNORMAL LOW (ref 135–145)
Sodium: 133 mmol/L — ABNORMAL LOW (ref 135–145)
Sodium: 134 mmol/L — ABNORMAL LOW (ref 135–145)
Sodium: 134 mmol/L — ABNORMAL LOW (ref 135–145)
Sodium: 135 mmol/L (ref 135–145)
Sodium: 136 mmol/L (ref 135–145)
Sodium: 137 mmol/L (ref 135–145)
Sodium: 137 mmol/L (ref 135–145)
TCO2: 25 mmol/L (ref 22–32)
TCO2: 25 mmol/L (ref 22–32)
TCO2: 26 mmol/L (ref 22–32)
TCO2: 26 mmol/L (ref 22–32)
TCO2: 27 mmol/L (ref 22–32)
TCO2: 27 mmol/L (ref 22–32)
TCO2: 27 mmol/L (ref 22–32)
TCO2: 28 mmol/L (ref 22–32)
TCO2: 28 mmol/L (ref 22–32)
TCO2: 29 mmol/L (ref 22–32)
TCO2: 30 mmol/L (ref 22–32)
TCO2: 31 mmol/L (ref 22–32)
pCO2 arterial: 43.6 mmHg (ref 32–48)
pCO2 arterial: 46.3 mmHg (ref 32–48)
pCO2 arterial: 47.9 mmHg (ref 32–48)
pCO2 arterial: 48.8 mmHg — ABNORMAL HIGH (ref 32–48)
pCO2 arterial: 49.4 mmHg — ABNORMAL HIGH (ref 32–48)
pCO2 arterial: 49.8 mmHg — ABNORMAL HIGH (ref 32–48)
pCO2 arterial: 51.4 mmHg — ABNORMAL HIGH (ref 32–48)
pCO2 arterial: 52.5 mmHg — ABNORMAL HIGH (ref 32–48)
pCO2 arterial: 56.1 mmHg — ABNORMAL HIGH (ref 32–48)
pCO2 arterial: 57.5 mmHg — ABNORMAL HIGH (ref 32–48)
pCO2 arterial: 58.4 mmHg — ABNORMAL HIGH (ref 32–48)
pCO2 arterial: 61.3 mmHg — ABNORMAL HIGH (ref 32–48)
pH, Arterial: 7.218 — ABNORMAL LOW (ref 7.35–7.45)
pH, Arterial: 7.27 — ABNORMAL LOW (ref 7.35–7.45)
pH, Arterial: 7.276 — ABNORMAL LOW (ref 7.35–7.45)
pH, Arterial: 7.276 — ABNORMAL LOW (ref 7.35–7.45)
pH, Arterial: 7.293 — ABNORMAL LOW (ref 7.35–7.45)
pH, Arterial: 7.309 — ABNORMAL LOW (ref 7.35–7.45)
pH, Arterial: 7.309 — ABNORMAL LOW (ref 7.35–7.45)
pH, Arterial: 7.312 — ABNORMAL LOW (ref 7.35–7.45)
pH, Arterial: 7.321 — ABNORMAL LOW (ref 7.35–7.45)
pH, Arterial: 7.326 — ABNORMAL LOW (ref 7.35–7.45)
pH, Arterial: 7.405 (ref 7.35–7.45)
pH, Arterial: 7.44 (ref 7.35–7.45)
pO2, Arterial: 107 mmHg (ref 83–108)
pO2, Arterial: 129 mmHg — ABNORMAL HIGH (ref 83–108)
pO2, Arterial: 141 mmHg — ABNORMAL HIGH (ref 83–108)
pO2, Arterial: 160 mmHg — ABNORMAL HIGH (ref 83–108)
pO2, Arterial: 168 mmHg — ABNORMAL HIGH (ref 83–108)
pO2, Arterial: 172 mmHg — ABNORMAL HIGH (ref 83–108)
pO2, Arterial: 206 mmHg — ABNORMAL HIGH (ref 83–108)
pO2, Arterial: 241 mmHg — ABNORMAL HIGH (ref 83–108)
pO2, Arterial: 304 mmHg — ABNORMAL HIGH (ref 83–108)
pO2, Arterial: 67 mmHg — ABNORMAL LOW (ref 83–108)
pO2, Arterial: 88 mmHg (ref 83–108)
pO2, Arterial: 99 mmHg (ref 83–108)

## 2022-02-23 LAB — CBC WITH DIFFERENTIAL/PLATELET
Abs Immature Granulocytes: 0 10*3/uL (ref 0.00–0.07)
Basophils Absolute: 0.3 10*3/uL — ABNORMAL HIGH (ref 0.0–0.1)
Basophils Relative: 1 %
Eosinophils Absolute: 0 10*3/uL (ref 0.0–0.5)
Eosinophils Relative: 0 %
HCT: 26.6 % — ABNORMAL LOW (ref 39.0–52.0)
Hemoglobin: 8.7 g/dL — ABNORMAL LOW (ref 13.0–17.0)
Lymphocytes Relative: 7 %
Lymphs Abs: 1.9 10*3/uL (ref 0.7–4.0)
MCH: 29.6 pg (ref 26.0–34.0)
MCHC: 32.7 g/dL (ref 30.0–36.0)
MCV: 90.5 fL (ref 80.0–100.0)
Monocytes Absolute: 1.9 10*3/uL — ABNORMAL HIGH (ref 0.1–1.0)
Monocytes Relative: 7 %
Neutro Abs: 23 10*3/uL — ABNORMAL HIGH (ref 1.7–7.7)
Neutrophils Relative %: 85 %
Platelets: 127 10*3/uL — ABNORMAL LOW (ref 150–400)
RBC: 2.94 MIL/uL — ABNORMAL LOW (ref 4.22–5.81)
RDW: 14.6 % (ref 11.5–15.5)
WBC: 27 10*3/uL — ABNORMAL HIGH (ref 4.0–10.5)
nRBC: 0 % (ref 0.0–0.2)
nRBC: 0 /100 WBC

## 2022-02-23 LAB — BPAM FFP
Blood Product Expiration Date: 202303062359
Blood Product Expiration Date: 202303062359
Blood Product Expiration Date: 202303062359
Blood Product Expiration Date: 202303062359
Blood Product Expiration Date: 202303082359
Blood Product Expiration Date: 202303082359
Blood Product Expiration Date: 202303082359
Blood Product Expiration Date: 202303082359
ISSUE DATE / TIME: 202303030802
ISSUE DATE / TIME: 202303030802
ISSUE DATE / TIME: 202303030802
ISSUE DATE / TIME: 202303030802
ISSUE DATE / TIME: 202303031429
ISSUE DATE / TIME: 202303031429
ISSUE DATE / TIME: 202303031545
ISSUE DATE / TIME: 202303031545
Unit Type and Rh: 6200
Unit Type and Rh: 6200
Unit Type and Rh: 6200
Unit Type and Rh: 6200
Unit Type and Rh: 6200
Unit Type and Rh: 6200
Unit Type and Rh: 6200
Unit Type and Rh: 6200

## 2022-02-23 LAB — COOXEMETRY PANEL
Carboxyhemoglobin: 1.3 % (ref 0.5–1.5)
Methemoglobin: 1.1 % (ref 0.0–1.5)
O2 Saturation: 75 %
Total hemoglobin: 8.8 g/dL — ABNORMAL LOW (ref 12.0–16.0)

## 2022-02-23 LAB — CBC
HCT: 23.8 % — ABNORMAL LOW (ref 39.0–52.0)
HCT: 25.7 % — ABNORMAL LOW (ref 39.0–52.0)
HCT: 27.2 % — ABNORMAL LOW (ref 39.0–52.0)
Hemoglobin: 7.6 g/dL — ABNORMAL LOW (ref 13.0–17.0)
Hemoglobin: 8.6 g/dL — ABNORMAL LOW (ref 13.0–17.0)
Hemoglobin: 9 g/dL — ABNORMAL LOW (ref 13.0–17.0)
MCH: 29.2 pg (ref 26.0–34.0)
MCH: 30.1 pg (ref 26.0–34.0)
MCH: 30.6 pg (ref 26.0–34.0)
MCHC: 31.9 g/dL (ref 30.0–36.0)
MCHC: 33.1 g/dL (ref 30.0–36.0)
MCHC: 33.5 g/dL (ref 30.0–36.0)
MCV: 91 fL (ref 80.0–100.0)
MCV: 91.5 fL (ref 80.0–100.0)
MCV: 91.5 fL (ref 80.0–100.0)
Platelets: 123 10*3/uL — ABNORMAL LOW (ref 150–400)
Platelets: 141 10*3/uL — ABNORMAL LOW (ref 150–400)
Platelets: 142 10*3/uL — ABNORMAL LOW (ref 150–400)
RBC: 2.6 MIL/uL — ABNORMAL LOW (ref 4.22–5.81)
RBC: 2.81 MIL/uL — ABNORMAL LOW (ref 4.22–5.81)
RBC: 2.99 MIL/uL — ABNORMAL LOW (ref 4.22–5.81)
RDW: 14.4 % (ref 11.5–15.5)
RDW: 14.5 % (ref 11.5–15.5)
RDW: 14.6 % (ref 11.5–15.5)
WBC: 21.9 10*3/uL — ABNORMAL HIGH (ref 4.0–10.5)
WBC: 33.9 10*3/uL — ABNORMAL HIGH (ref 4.0–10.5)
WBC: 35.9 10*3/uL — ABNORMAL HIGH (ref 4.0–10.5)
nRBC: 0 % (ref 0.0–0.2)
nRBC: 0 % (ref 0.0–0.2)
nRBC: 0 % (ref 0.0–0.2)

## 2022-02-23 LAB — COMPREHENSIVE METABOLIC PANEL
ALT: 30 U/L (ref 0–44)
AST: 148 U/L — ABNORMAL HIGH (ref 15–41)
Albumin: 3.4 g/dL — ABNORMAL LOW (ref 3.5–5.0)
Alkaline Phosphatase: 52 U/L (ref 38–126)
Anion gap: 8 (ref 5–15)
BUN: 14 mg/dL (ref 6–20)
CO2: 24 mmol/L (ref 22–32)
Calcium: 8.2 mg/dL — ABNORMAL LOW (ref 8.9–10.3)
Chloride: 101 mmol/L (ref 98–111)
Creatinine, Ser: 1.07 mg/dL (ref 0.61–1.24)
GFR, Estimated: >60 mL/min (ref >60)
Glucose, Bld: 132 mg/dL — ABNORMAL HIGH (ref 70–99)
Potassium: 5.7 mmol/L — ABNORMAL HIGH (ref 3.5–5.1)
Sodium: 133 mmol/L — ABNORMAL LOW (ref 135–145)
Total Bilirubin: 3.8 mg/dL — ABNORMAL HIGH (ref 0.3–1.2)
Total Protein: 5 g/dL — ABNORMAL LOW (ref 6.5–8.1)

## 2022-02-23 LAB — GLUCOSE, CAPILLARY
Glucose-Capillary: 111 mg/dL — ABNORMAL HIGH (ref 70–99)
Glucose-Capillary: 125 mg/dL — ABNORMAL HIGH (ref 70–99)
Glucose-Capillary: 127 mg/dL — ABNORMAL HIGH (ref 70–99)
Glucose-Capillary: 135 mg/dL — ABNORMAL HIGH (ref 70–99)
Glucose-Capillary: 136 mg/dL — ABNORMAL HIGH (ref 70–99)
Glucose-Capillary: 136 mg/dL — ABNORMAL HIGH (ref 70–99)
Glucose-Capillary: 139 mg/dL — ABNORMAL HIGH (ref 70–99)
Glucose-Capillary: 139 mg/dL — ABNORMAL HIGH (ref 70–99)
Glucose-Capillary: 140 mg/dL — ABNORMAL HIGH (ref 70–99)
Glucose-Capillary: 141 mg/dL — ABNORMAL HIGH (ref 70–99)
Glucose-Capillary: 144 mg/dL — ABNORMAL HIGH (ref 70–99)
Glucose-Capillary: 145 mg/dL — ABNORMAL HIGH (ref 70–99)
Glucose-Capillary: 147 mg/dL — ABNORMAL HIGH (ref 70–99)
Glucose-Capillary: 153 mg/dL — ABNORMAL HIGH (ref 70–99)
Glucose-Capillary: 153 mg/dL — ABNORMAL HIGH (ref 70–99)
Glucose-Capillary: 155 mg/dL — ABNORMAL HIGH (ref 70–99)
Glucose-Capillary: 157 mg/dL — ABNORMAL HIGH (ref 70–99)
Glucose-Capillary: 166 mg/dL — ABNORMAL HIGH (ref 70–99)
Glucose-Capillary: 176 mg/dL — ABNORMAL HIGH (ref 70–99)

## 2022-02-23 LAB — BASIC METABOLIC PANEL
Anion gap: 6 (ref 5–15)
Anion gap: 8 (ref 5–15)
Anion gap: 9 (ref 5–15)
BUN: 15 mg/dL (ref 6–20)
BUN: 16 mg/dL (ref 6–20)
BUN: 18 mg/dL (ref 6–20)
CO2: 24 mmol/L (ref 22–32)
CO2: 24 mmol/L (ref 22–32)
CO2: 23 mmol/L (ref 22–32)
Calcium: 8 mg/dL — ABNORMAL LOW (ref 8.9–10.3)
Calcium: 8.1 mg/dL — ABNORMAL LOW (ref 8.9–10.3)
Calcium: 8 mg/dL — ABNORMAL LOW (ref 8.9–10.3)
Chloride: 102 mmol/L (ref 98–111)
Chloride: 101 mmol/L (ref 98–111)
Chloride: 98 mmol/L (ref 98–111)
Creatinine, Ser: 1.14 mg/dL (ref 0.61–1.24)
Creatinine, Ser: 1.33 mg/dL — ABNORMAL HIGH (ref 0.61–1.24)
Creatinine, Ser: 1.37 mg/dL — ABNORMAL HIGH (ref 0.61–1.24)
GFR, Estimated: >60 mL/min (ref >60)
GFR, Estimated: >60 mL/min (ref >60)
GFR, Estimated: >60 mL/min (ref >60)
Glucose, Bld: 169 mg/dL — ABNORMAL HIGH (ref 70–99)
Glucose, Bld: 134 mg/dL — ABNORMAL HIGH (ref 70–99)
Glucose, Bld: 164 mg/dL — ABNORMAL HIGH (ref 70–99)
Potassium: 5.8 mmol/L — ABNORMAL HIGH (ref 3.5–5.1)
Potassium: 5.4 mmol/L — ABNORMAL HIGH (ref 3.5–5.1)
Potassium: 5.2 mmol/L — ABNORMAL HIGH (ref 3.5–5.1)
Sodium: 132 mmol/L — ABNORMAL LOW (ref 135–145)
Sodium: 133 mmol/L — ABNORMAL LOW (ref 135–145)
Sodium: 130 mmol/L — ABNORMAL LOW (ref 135–145)

## 2022-02-23 LAB — BPAM PLATELET PHERESIS
Blood Product Expiration Date: 202303042359
ISSUE DATE / TIME: 202303031437
Unit Type and Rh: 8400

## 2022-02-23 LAB — PREPARE FRESH FROZEN PLASMA
Unit division: 0
Unit division: 0
Unit division: 0
Unit division: 0

## 2022-02-23 LAB — MAGNESIUM
Magnesium: 2 mg/dL (ref 1.7–2.4)
Magnesium: 2.3 mg/dL (ref 1.7–2.4)
Magnesium: 2.6 mg/dL — ABNORMAL HIGH (ref 1.7–2.4)

## 2022-02-23 LAB — PREPARE CRYOPRECIPITATE: Unit division: 0

## 2022-02-23 LAB — BPAM CRYOPRECIPITATE
Blood Product Expiration Date: 202303032040
ISSUE DATE / TIME: 202303031456
Unit Type and Rh: 6200

## 2022-02-23 LAB — PREPARE PLATELET PHERESIS: Unit division: 0

## 2022-02-23 LAB — CREATININE, SERUM
Creatinine, Ser: 1.1 mg/dL (ref 0.61–1.24)
GFR, Estimated: >60 mL/min (ref >60)

## 2022-02-23 LAB — BRAIN NATRIURETIC PEPTIDE: B Natriuretic Peptide: 616.6 pg/mL — ABNORMAL HIGH (ref 0.0–100.0)

## 2022-02-23 LAB — PHOSPHORUS: Phosphorus: 3.9 mg/dL (ref 2.5–4.6)

## 2022-02-23 LAB — PROTIME-INR
INR: 1.1 (ref 0.8–1.2)
Prothrombin Time: 14.2 seconds (ref 11.4–15.2)

## 2022-02-23 LAB — CALCIUM, IONIZED: Calcium, Ionized, Serum: 4.7 mg/dL (ref 4.5–5.6)

## 2022-02-23 LAB — LACTATE DEHYDROGENASE: LDH: 366 U/L — ABNORMAL HIGH (ref 98–192)

## 2022-02-23 LAB — PREPARE RBC (CROSSMATCH)

## 2022-02-23 MED ORDER — POLYETHYLENE GLYCOL 3350 17 G PO PACK
17.0000 g | PACK | Freq: Every day | ORAL | Status: DC
  Administered 2022-02-23 – 2022-02-25 (×3): 17 g
  Filled 2022-02-23 (×3): qty 1

## 2022-02-23 MED ORDER — FENTANYL CITRATE PF 50 MCG/ML IJ SOSY
50.0000 ug | PREFILLED_SYRINGE | Freq: Once | INTRAMUSCULAR | Status: AC
  Administered 2022-02-23: 50 ug via INTRAVENOUS
  Filled 2022-02-23: qty 1

## 2022-02-23 MED ORDER — SODIUM CHLORIDE 0.9% IV SOLUTION: Freq: Once | INTRAVENOUS | Status: AC

## 2022-02-23 MED ORDER — ARFORMOTEROL TARTRATE 15 MCG/2ML IN NEBU
15.0000 ug | INHALATION_SOLUTION | Freq: Two times a day (BID) | RESPIRATORY_TRACT | Status: DC
  Administered 2022-02-23 – 2022-02-26 (×7): 15 ug via RESPIRATORY_TRACT
  Filled 2022-02-23 (×7): qty 2

## 2022-02-23 MED ORDER — REVEFENACIN 175 MCG/3ML IN SOLN
175.0000 ug | Freq: Every day | RESPIRATORY_TRACT | Status: DC
  Administered 2022-02-23 – 2022-02-26 (×4): 175 ug via RESPIRATORY_TRACT
  Filled 2022-02-23 (×5): qty 3

## 2022-02-23 MED ORDER — NOREPINEPHRINE 16 MG/250ML-% IV SOLN
0.0000 ug/min | INTRAVENOUS | Status: DC
  Administered 2022-02-23: 30 ug/min via INTRAVENOUS
  Administered 2022-02-23: 36 ug/min via INTRAVENOUS
  Administered 2022-02-23 (×2): 50 ug/min via INTRAVENOUS
  Administered 2022-02-24: 80 ug/min via INTRAVENOUS
  Administered 2022-02-24: 72 ug/min via INTRAVENOUS
  Administered 2022-02-24: 85 ug/min via INTRAVENOUS
  Administered 2022-02-24: 55 ug/min via INTRAVENOUS
  Administered 2022-02-24: 80 ug/min via INTRAVENOUS
  Administered 2022-02-24: 65 ug/min via INTRAVENOUS
  Administered 2022-02-25: 72 ug/min via INTRAVENOUS
  Administered 2022-02-25: 100 ug/min via INTRAVENOUS
  Administered 2022-02-25: 69 ug/min via INTRAVENOUS
  Administered 2022-02-25: 50 ug/min via INTRAVENOUS
  Administered 2022-02-25 – 2022-02-26 (×7): 100 ug/min via INTRAVENOUS
  Filled 2022-02-23 (×8): qty 250
  Filled 2022-02-23: qty 500
  Filled 2022-02-23 (×13): qty 250

## 2022-02-23 MED ORDER — FUROSEMIDE 10 MG/ML IJ SOLN
12.0000 mg/h | INTRAVENOUS | Status: DC
  Administered 2022-02-23: 4 mg/h via INTRAVENOUS
  Filled 2022-02-23 (×2): qty 20

## 2022-02-23 MED ORDER — 0.9 % SODIUM CHLORIDE (POUR BTL) OPTIME
TOPICAL | Status: DC | PRN
  Administered 2022-02-22: 5000 mL

## 2022-02-23 MED ORDER — FENTANYL 2500MCG IN NS 250ML (10MCG/ML) PREMIX INFUSION
0.0000 ug/h | INTRAVENOUS | Status: DC
  Administered 2022-02-23: 200 ug/h via INTRAVENOUS
  Administered 2022-02-23: 50 ug/h via INTRAVENOUS
  Administered 2022-02-24: 225 ug/h via INTRAVENOUS
  Administered 2022-02-24: 275 ug/h via INTRAVENOUS
  Administered 2022-02-24: 300 ug/h via INTRAVENOUS
  Administered 2022-02-25: 175 ug/h via INTRAVENOUS
  Administered 2022-02-25: 200 ug/h via INTRAVENOUS
  Administered 2022-02-26: 175 ug/h via INTRAVENOUS
  Filled 2022-02-23 (×8): qty 250

## 2022-02-23 MED ORDER — ORAL CARE MOUTH RINSE
15.0000 mL | OROMUCOSAL | Status: DC
  Administered 2022-02-23 – 2022-02-26 (×35): 15 mL via OROMUCOSAL

## 2022-02-23 MED ORDER — CHLORHEXIDINE GLUCONATE 0.12% ORAL RINSE (MEDLINE KIT)
15.0000 mL | Freq: Two times a day (BID) | OROMUCOSAL | Status: DC
  Administered 2022-02-23 – 2022-02-26 (×7): 15 mL via OROMUCOSAL

## 2022-02-23 MED ORDER — SODIUM ZIRCONIUM CYCLOSILICATE 10 G PO PACK
10.0000 g | PACK | Freq: Once | ORAL | Status: DC
  Filled 2022-02-23: qty 1

## 2022-02-23 MED ORDER — MIDAZOLAM BOLUS VIA INFUSION
0.0000 mg | INTRAVENOUS | Status: DC | PRN
  Filled 2022-02-23: qty 5

## 2022-02-23 MED ORDER — MIDAZOLAM-SODIUM CHLORIDE 100-0.9 MG/100ML-% IV SOLN
0.0000 mg/h | INTRAVENOUS | Status: DC
  Administered 2022-02-23: 8 mg/h via INTRAVENOUS
  Administered 2022-02-23: 2 mg/h via INTRAVENOUS
  Administered 2022-02-24: 7 mg/h via INTRAVENOUS
  Administered 2022-02-25: 6 mg/h via INTRAVENOUS
  Administered 2022-02-26: 4 mg/h via INTRAVENOUS
  Filled 2022-02-23 (×5): qty 100

## 2022-02-23 MED ORDER — PANTOPRAZOLE 2 MG/ML SUSPENSION
40.0000 mg | Freq: Every day | ORAL | Status: DC
  Administered 2022-02-24: 40 mg
  Filled 2022-02-23: qty 20

## 2022-02-23 MED ORDER — DOCUSATE SODIUM 50 MG/5ML PO LIQD
100.0000 mg | Freq: Two times a day (BID) | ORAL | Status: DC
  Administered 2022-02-23 – 2022-02-25 (×6): 100 mg
  Filled 2022-02-23 (×6): qty 10

## 2022-02-23 MED ORDER — ALLOPURINOL 300 MG PO TABS
300.0000 mg | ORAL_TABLET | Freq: Every day | ORAL | Status: DC
Start: 2022-02-24 — End: 2022-02-27
  Administered 2022-02-24 – 2022-02-25 (×2): 300 mg
  Filled 2022-02-23 (×2): qty 1

## 2022-02-23 MED ORDER — FENTANYL BOLUS VIA INFUSION
50.0000 ug | INTRAVENOUS | Status: DC | PRN
  Filled 2022-02-23: qty 100

## 2022-02-23 MED ORDER — FUROSEMIDE 10 MG/ML IJ SOLN
40.0000 mg | Freq: Once | INTRAMUSCULAR | Status: AC
  Administered 2022-02-23: 40 mg via INTRAVENOUS
  Filled 2022-02-23: qty 4

## 2022-02-23 MED ORDER — ATORVASTATIN CALCIUM 80 MG PO TABS
80.0000 mg | ORAL_TABLET | Freq: Every day | ORAL | Status: DC
Start: 2022-02-24 — End: 2022-02-27
  Administered 2022-02-24 – 2022-02-25 (×2): 80 mg
  Filled 2022-02-23 (×2): qty 1

## 2022-02-23 NOTE — Progress Notes (Signed)
Patient ID: Colin Walton, male   DOB: 03-13-1971, 51 y.o.   MRN: [id number] ?TCTS Evening Rounds: ? ?Hemodynamics about the same tonight with MAP 70's on high dose NE 49, epi 8, vaso 0.04, milrinone decreased to 0.25 to see if that would help BP.  ? ?CVP 14, CI 3.1. ?VAD flows are fine. ? ?Remains sedated on vent on 200 fentanyl and 5 versed per hr which is partly responsible for vasopressor requirement. ? ?Lasix drip at 4 with urine output maybe 60/hr. Creat increased to 1.3 this evening and K+ 5.4. Likely acute kidney injury due to baseline CKD, pump run, hypotension and high dose vasopressors. ? ?Hgb 7.6 this afternoon and gave a unit of PRBC's. ?

## 2022-02-23 NOTE — Plan of Care (Signed)
?  Problem: Clinical Measurements: ?Goal: Will remain free from infection ?Outcome: Progressing ?Goal: Diagnostic test results will improve ?Outcome: Progressing ?Goal: Cardiovascular complication will be avoided ?Outcome: Progressing ?  ?Problem: Pain Managment: ?Goal: General experience of comfort will improve ?Outcome: Progressing ?  ?Problem: Cardiac: ?Goal: Ability to achieve and maintain adequate cardiopulmonary perfusion will improve ?Outcome: Progressing ?  ?Problem: Cardiac: ?Goal: Will achieve and/or maintain hemodynamic stability ?Outcome: Progressing ?  ?Problem: Clinical Measurements: ?Goal: Postoperative complications will be avoided or minimized ?Outcome: Progressing ?  ?Problem: Urinary Elimination: ?Goal: Ability to achieve and maintain adequate renal perfusion and functioning will improve ?Outcome: Progressing ?  ?Problem: Fluid Volume: ?Goal: Risk for excess fluid volume will decrease ?Outcome: Progressing ?  ?Problem: Clinical Measurements: ?Goal: Will remain free from infection ?Outcome: Progressing ?  ?

## 2022-02-23 NOTE — Progress Notes (Signed)
Drive line dressing change:  ? LVAD driveline dressing was changed using proper sterile technique. Old dressing removed. Driveline site cleaned using chloroprep x2. Silver strip applied. New gauze dressing applied using sterile technique. Driveline secured at driveline site. Driveline site bloody with old and new drainage, no redness, no swelling, no tenderness, no odor from drainage. Driveline anchor changed, New Driveline anchor intact and applied correctly.  ? ?NEXT DRESSING CHANGE DUE 02/24/2022.  ? ?Alma Friendly RN   ? ?

## 2022-02-23 NOTE — Progress Notes (Signed)
Patient ID: Colin Walton, male   DOB: 1971/05/17, 51 y.o.   MRN: [id number] HeartMate 3 Rounding Note  Subjective:    Remains intubated and sedated on vent on NO due to severe underlying lung disease and volume excess.  Hemodynamically labile overnight but seems more stable this am but on multiple high dose vasopressors. Milrinone 0.375. epi 5, NE 34, vaso 0.04. CI 2.5, PA 61/30, CVP 20  Co-Ox 75% at 3 am.  LVAD INTERROGATION:  HeartMate IIl LVAD:  Flow 4.2 liters/min, speed 5600, power 4.2, PI 3.2.  No flow problems overnight.  Objective:    Vital Signs:   Temp:  [99.9 F (37.7 C)-101.5 F (38.6 C)] 99.9 F (37.7 C) (03/04 0700) Pulse Rate:  [88-107] 90 (03/04 0500) Resp:  [0-21] 20 (03/04 0700) BP: (39-104)/(26-79) 39/31 (03/04 0630) SpO2:  [87 %-100 %] 88 % (03/04 0500) Arterial Line BP: (57-88)/(52-78) 88/74 (03/04 0700) FiO2 (%):  [100 %] 100 % (03/04 0400) Weight:  [115.2 kg] 115.2 kg (03/04 0645) Last BM Date :  ([facility]) Mean arterial Pressure 70's-80's  Intake/Output:   Intake/Output Summary (Last 24 hours) at 02/23/2022 0740 Last data filed at 02/23/2022 0700 Gross per 24 hour  Intake 11819.27 ml  Output 3350 ml  Net 8469.27 ml     Physical Exam: General:  intubated and sedated. anasarca HEENT: no bleeding from extractions. Cor: distant heart sounds with LVAD hum present. Lungs: clear but decreased BS Abdomen: soft, protuberant but at baseline. No bowel sounds. Extremities: warm, edematous Neuro: reportedly woke up some overnight but had to be sedated more due to bucking vent.  Telemetry: AV paced 90. Sinus and junctional under pacer.  Labs: Basic Metabolic Panel: Recent Labs  Lab 02/23/2022 0423 02/21/22 0413 03/19/2022 0415 03/19/2022 0840 03/20/2022 1256 03/10/2022 1331 02/24/2022 1335 02/27/2022 1623 02/23/2022 1836 03/20/2022 1841 03/18/2022 2003 03/06/2022 2352 02/23/22 0001 02/23/22 0257 02/23/22 0300 02/23/22 0645  NA 134* 137 135   < > 134* 137   < > 139  135   < > 136  --  134* 132* 133* 132*  K 4.6 5.1 5.1   < > 5.2* 4.6   < > 4.2 4.8   < > 5.1  --  5.4* 5.6* 5.7* 5.6*  CL 96* 99 96*   < > 100 99  --  101 104  --   --   --   --   --  101  --   CO2 31 30 32  --   --   --   --   --  26  --   --   --   --   --  24  --   GLUCOSE 238* 174* 133*   < > 127* 125*  --  139* 154*  --   --   --   --   --  132*  --   BUN 34* 22* 16   < > 18 18  --  14 13  --   --   --   --   --  14  --   CREATININE 1.25* 1.09 0.96   < > 0.80 0.80  --  0.80 0.96  --   --  1.10  --   --  1.07  --   CALCIUM 9.5 9.3 9.5  --   --   --   --   --  8.4*  --   --   --   --   --  8.2*  --   MG  --   --   --   --   --   --   --   --  2.0  --   --  2.6*  --   --  2.3  --   PHOS  --   --   --   --   --   --   --   --   --   --   --   --   --   --  3.9  --    < > = values in this interval not displayed.    Liver Function Tests: Recent Labs  Lab 02/23/22 0300  AST 148*  ALT 30  ALKPHOS 52  BILITOT 3.8*  PROT 5.0*  ALBUMIN 3.4*   No results for input(s): LIPASE, AMYLASE in the last 168 hours. No results for input(s): AMMONIA in the last 168 hours.  CBC: Recent Labs  Lab 02/21/22 0413 03/09/2022 0415 03/07/2022 0840 03/20/2022 1109 02/27/2022 1112 02/25/2022 1836 03/14/2022 1841 03/14/2022 2352 02/23/22 0001 02/23/22 0257 02/23/22 0300 02/23/22 0645  WBC 7.1 5.8  --   --   --  18.0*  --  21.9*  --   --  27.0*  --   NEUTROABS  --   --   --   --   --   --   --   --   --   --  23.0*  --   HGB 16.2 15.9   < > 13.4   < > 11.7*   < > 9.0* 8.8* 8.8* 8.7* 8.2*  HCT 52.8* 51.0   < > 42.1   < > 35.4*   < > 27.2* 26.0* 26.0* 26.6* 24.0*  MCV 94.5 93.2  --   --   --  91.7  --  91.0  --   --  90.5  --   PLT 243 227  --  202  --  135*   132*  --  123*  --   --  127*  --    < > = values in this interval not displayed.    INR: Recent Labs  Lab 03/14/2022 1836 02/23/22 0300  INR 0.7*   0.7* 1.1    Other results: EKG:   Imaging: DG Chest Port 1 View  Result Date:  03/05/2022 CLINICAL DATA:  Postop. Status post CABG aortic valve replacement. Left ventricular assist device present. EXAM: PORTABLE CHEST 1 VIEW COMPARISON:  Preoperative radiograph 02/13/2022 FINDINGS: Patient is post median sternotomy with prosthetic cardiac valve. Endotracheal tube tip is approximately 6.6 cm from the carina just below the clavicular heads. Enteric tube is in place with tip and side-port below the diaphragm. Left ventricular assist device in place. There are bilateral chest tubes. Right internal jugular Swan-Ganz catheter tip overlies the main pulmonary outflow tract. Right upper lobe collapse. Ill-defined retrocardiac opacity likely atelectasis and pleural fluid. Cardiomegaly is similar to preoperative exam. No visualized pneumothorax. IMPRESSION: 1. Post median sternotomy with prosthetic cardiac valve. Left ventricular device in place. Support apparatus as described. 2. Right upper lobe collapse. 3. Retrocardiac opacity likely atelectasis and pleural fluid. 4. Bilateral chest tubes in place. No visualized pneumothorax. Electronically Signed   By: Keith Rake M.D.   On: 03/17/2022 18:54     Medications:     Scheduled Medications:  (feeding supplement) PROSource Plus  30 mL Oral BID BM   sodium chloride   Intravenous Once   sodium chloride  Intravenous Once   acetaminophen  1,000 mg Oral Q6H   Or   acetaminophen (TYLENOL) oral liquid 160 mg/5 mL  1,000 mg Per Tube Q6H   allopurinol  300 mg Oral Daily   aspirin EC  325 mg Oral Daily   Or   aspirin  324 mg Per Tube Daily   Or   aspirin  300 mg Rectal Daily   atorvastatin  80 mg Oral Daily   bisacodyl  10 mg Oral Daily   Or   bisacodyl  10 mg Rectal Daily   chlorhexidine  15 mL Mouth Rinse BID   chlorhexidine  15 mL Mouth/Throat NOW   chlorhexidine gluconate (MEDLINE KIT)  15 mL Mouth Rinse BID   Chlorhexidine Gluconate Cloth  6 each Topical Daily   docusate sodium  200 mg Oral Daily   lactose free nutrition  237 mL  Oral TID WC   mouth rinse  15 mL Mouth Rinse 10 times per day   [START ON 02/24/2022] pantoprazole  40 mg Oral Daily   sodium chloride flush  3 mL Intravenous Q12H    Infusions:  sodium chloride Stopped (02/23/22 5102)   sodium chloride     sodium chloride     amiodarone 60 mg/hr (02/23/22 0700)    ceFAZolin (ANCEF) IV Stopped (02/23/22 0615)   dexmedetomidine (PRECEDEX) IV infusion 1.3 mcg/kg/hr (02/23/22 0700)   epinephrine 5 mcg/min (02/23/22 0700)   fluconazole (DIFLUCAN) IV 100 mL/hr at 02/23/22 0700   insulin 4 Units/hr (02/23/22 0700)   lactated ringers     lactated ringers 20 mL/hr at 02/23/22 0700   milrinone 0.375 mcg/kg/min (02/23/22 0700)   norepinephrine (LEVOPHED) Adult infusion 34 mcg/min (02/23/22 0700)   rifampin (RIFADIN) IVPB     vancomycin Stopped (03/19/2022 2118)   vasopressin 0.04 Units/min (02/23/22 0700)    PRN Medications: sodium chloride, dextrose, midazolam, morphine injection, ondansetron (ZOFRAN) IV, oxyCODONE, sodium chloride flush, traMADol   Assessment:   Acute on chronic biventricular heart failure with cardiogenic shock and LVEF< 20% 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  POD 1 s/p HM 3 LVAD, AVR with 25 mm pericardial valve and CABG x 1 to PDA with SVG.  Intraop coagulopathy requiring multiple units of FFP, cryo, plts still with bleeding from needle holes and raw surface with abnormal TEG after that and required half dose Factor Vll to achieve adequate hemostasis to leave the OR.   Preop PFT's with severe obstructive and restrictive lung disease and severe decrease in diffusion capacity with bullous disease of upper lobes on chest CT. Baseline PCO2 48 on preop ABG and in OR after intubation PCO2 55. Suspect he will be difficult to extubate and keep off vent. CXR postop showed RUL collapse which is improved this am but right lung not fully aerated. Will consult CCM to  help with vent management and eventual extubation.  Postop atrial fib and VT. On IV amio.  OSA on CPAP  Morbid obesity with BMI 40.   Plan/Discussion:    Goals at this point are to maintain MAP 70-80's with vasopressors, remove volume with diuretics to decrease CVP and PAP. Keep on vent.  Keep all chest tubes in.  Hold off on anticoagulation today and probably start some heparin tomorrow.  I reviewed the LVAD parameters from today, and compared the results to the patient's prior recorded data.  No programming changes were made.  The LVAD is  functioning within specified parameters.  LVAD interrogation was negative for any significant power changes, alarms or PI events/speed drops.  LVAD equipment check completed and is in good working order.  Back-up equipment present.    Length of Stay: [address]  Fernande Boyden [facility] 02/23/2022, 7:40 AM

## 2022-02-23 NOTE — Consult Note (Signed)
NAMEDillan Walton, MRN:  [id number], DOB:  [birth year], LOS: 15 ADMISSION DATE:  02/17/2022, CONSULTATION DATE:  02/23/22 REFERRING MD:  TCTS, CHIEF COMPLAINT:  SOB   History of Present Illness:  51 year old man w/ hx of HTN, NICM, OSA on CPAP, smoking/EtOH abuse who presented with decompensated heart failure. Despite optimization remained in a symptomatic low slow state so underwent CABG x 1 outflow graft to RCA, aortic valve replacement and Heartmate 3 LVAD on 03/15/2022.  Procedure complicated by persistent coagulopathy and multiple runs of VF/VT.  Required amio, multiple blood preducts.  Eventually weaned off ECMO and brought to [facility].  Overnight issues with sedation and vent desynchrony requiring multitude of pushes of versed/fentanyl  PCCM consulted to assist with vent management.  Preop workup CT Chest bullous upper lobe emphysema with lower lungs preserved PFTs look mixed obstructive/restrictive with partially correcting but still low DLCO  Pertinent  Medical History  NICM HLD HTN OSA  Significant Hospital Events: Including procedures, antibiotic start and stop dates in addition to other pertinent events   Copied from CHF team note 2/17 admitted for a/c Biventricular HF, NYHA IIIb-IV symptoms, w/ low output. Stated on milrinone. LVAD work up initiated.  2/23 RHC/LHC on milrinone 0.125 mcg + Norepi 2 mcg w/ 2v CAD, Severe NICM EF < 20%, Elevated filling pressures with markedly reduced CO with severe AI 2/28 diuretics held for AKI  3/1 Multiple teeth extractions  3/3 HM-3 VAD placed with AVR and SVG (off outflow graft) to RCA  Interim History / Subjective:  Consulted Patient agitated on vent.  Objective   Blood pressure (!) 80/63, pulse 90, temperature 100.2 F (37.9 C), resp. rate 20, height  (1.702 m), weight 115.2 kg, SpO2 97 %. PAP: (45-66)/(17-31) 54/26 CVP:  [15 mmHg-22 mmHg] 16 mmHg CO:  [4.5 L/min-6.3 L/min] 5.2 L/min CI:  [2.1 L/min/m2-2.9 L/min/m2] 2.4 L/min/m2   Vent Mode: PRVC FiO2 (%):  [100 %] 100 % Set Rate:  [16 bmp-20 bmp] 20 bmp Vt Set:  [520 mL-600 mL] 520 mL PEEP:  [5 cmH20] 5 cmH20 Plateau Pressure:  [18 cmH20-24 cmH20] 20 cmH20   Intake/Output Summary (Last 24 hours) at 02/23/2022 1022 Last data filed at 02/23/2022 0900 Gross per 24 hour  Intake 10435.54 ml  Output 3325 ml  Net 7110.54 ml   Filed Weights   02/21/22 0600 03/01/2022 0433 02/23/22 0645  Weight: 105.7 kg 105.8 kg 115.2 kg    Examination: General: mildly agitated on ventt HENT: ETT in place with minimal secretions, pupils equal Lungs: rhonci worse on R, triggering vent Cardiovascular: LVAD hum, ext warm Abdomen: soft, Extremities: trace edema Neuro: was moving all 4 ext per nursing  Patient Lines/Drains/Airways Status     Active Line/Drains/Airways     Name Placement date Placement time Site Days   Arterial Line 03/03/2022 Left Radial 02/25/2022  0700  Radial  1   Chest Tube 5 Anterior Other (Comment) 32 Fr. 03/17/2022  1525  Other (Comment)  1   NG/OG Vented/Dual Lumen 14 Fr. External length of tube 53 cm 03/14/2022  1400  --  1   Urethral Catheter Valene Bors, RN Latex;Temperature probe 16 Fr. 02/21/2022  0825  Latex;Temperature probe  1   Y Chest Tube 1, 2, 3, and 4 Left Pleural 28 Fr. Right Pleural 28 Fr. Anterior Mediastinal 36 Fr. Anterior Mediastinal 36 Fr. 03/16/2022  1519  -- 1   Y Chest Tube 1, 2, 3, and 4 Left Pleural 28 Fr.  Right Pleural 28 Fr. Anterior Mediastinal 36 Fr. Anterior Mediastinal 36 Fr. 03/19/2022  1521  -- 1   Airway 8 mm 03/02/2022  0819  -- 1   Swan Ganz 03/14/2022 Right 03/22/2022  1009  -- 1   Incision (Closed) 03/21/2022 Chest Other (Comment) 03/02/2022  1027  -- 1   Incision (Closed) 03/06/2022 Leg Right 03/11/2022  1027  -- 1   Wound / Incision (Open or Dehisced) 04/27/16 Laceration Elbow Right 2 cm laceration r/elbow 04/27/16  1307  Elbow  2128   Wound / Incision (Open or Dehisced) 05/25/18 Burn Hand Right multiple blisters to anterior r/hand-fingers  05/25/18  1310  Hand  1370            Current drips: Insulin, milrinone, amiodarone, vasopressin, levophed, precedex  ABG mildly acidemic K a bit high AST mildly up Plts/CBC/WBC look okay Coags and hemolysis panel okay CXR mild edema and volume loss on R  Resolved Hospital Problem list   N/A  Assessment & Plan:  Postoperative ventilator management Mixed ischemic and nonischmemic CM, aortic regurgitation, Postop status 03/16/2022: HM-3 VAD placed with AVR and SVG (off outflow graft) to RCA Post cardiotomy shock improving Postop agitation, pain, and delirium- incompletely controlled with PRNs complicating vent wean Periop VT/VF Hyperkalemia- should improve with diuretic push today Baseline emphysema and mixed obstructive restrictive lung disease OSA on CPAP Postoperative fluid overload Baseline tobacco and EtOH abuse  - Diuretics and inotropes per TCTS and CHF teams - Discussed vent plan with RN and RT: will sedate more with benzo gtt for now; doing better with a PC mode in current agitated state, once more sedate will get back to [facility] and more traditional wean - Add brovana/yupelri  Best Practice (right click and "Reselect all SmartList Selections" daily)  Per TCTS  Labs   CBC: Recent Labs  Lab 02/21/22 0413 03/02/2022 0415 03/14/2022 0840 03/16/2022 1109 02/21/2022 1112 02/21/2022 1836 03/10/2022 1841 03/02/2022 2352 02/23/22 0001 02/23/22 0257 02/23/22 0300 02/23/22 0645  WBC 7.1 5.8  --   --   --  18.0*  --  21.9*  --   --  27.0*  --   NEUTROABS  --   --   --   --   --   --   --   --   --   --  23.0*  --   HGB 16.2 15.9   < > 13.4   < > 11.7*   < > 9.0* 8.8* 8.8* 8.7* 8.2*  HCT 52.8* 51.0   < > 42.1   < > 35.4*   < > 27.2* 26.0* 26.0* 26.6* 24.0*  MCV 94.5 93.2  --   --   --  91.7  --  91.0  --   --  90.5  --   PLT 243 227  --  202  --  135*   132*  --  123*  --   --  127*  --    < > = values in this interval not displayed.    Basic Metabolic Panel: Recent Labs  Lab  03/04/2022 0423 02/21/22 0413 03/15/2022 0415 03/15/2022 0840 03/01/2022 1256 02/28/2022 1318 03/21/2022 1331 02/25/2022 1335 03/13/2022 1623 02/25/2022 1836 03/21/2022 1841 02/21/2022 2003 03/05/2022 2352 02/23/22 0001 02/23/22 0257 02/23/22 0300 02/23/22 0645  NA 134* 137 135   < > 134*   < > 137   < > 139 135   < > 136  --  134* 132* 133* 132*  K  4.6 5.1 5.1   < > 5.2*   < > 4.6   < > 4.2 4.8   < > 5.1  --  5.4* 5.6* 5.7* 5.6*  CL 96* 99 96*   < > 100  --  99  --  101 104  --   --   --   --   --  101  --   CO2 31 30 32  --   --   --   --   --   --  26  --   --   --   --   --  24  --   GLUCOSE 238* 174* 133*   < > 127*  --  125*  --  139* 154*  --   --   --   --   --  132*  --   BUN 34* 22* 16   < > 18  --  18  --  14 13  --   --   --   --   --  14  --   CREATININE 1.25* 1.09 0.96   < > 0.80  --  0.80  --  0.80 0.96  --   --  1.10  --   --  1.07  --   CALCIUM 9.5 9.3 9.5  --   --   --   --   --   --  8.4*  --   --   --   --   --  8.2*  --   MG  --   --   --   --   --   --   --   --   --  2.0  --   --  2.6*  --   --  2.3  --   PHOS  --   --   --   --   --   --   --   --   --   --   --   --   --   --   --  3.9  --    < > = values in this interval not displayed.   GFR: Estimated Creatinine Clearance: 101.2 mL/min (by C-G formula based on SCr of 1.07 mg/dL). Recent Labs  Lab 03/21/2022 0415 02/27/2022 1836 03/22/2022 2352 02/23/22 0300  WBC 5.8 18.0* 21.9* 27.0*    Liver Function Tests: Recent Labs  Lab 02/23/22 0300  AST 148*  ALT 30  ALKPHOS 52  BILITOT 3.8*  PROT 5.0*  ALBUMIN 3.4*   No results for input(s): LIPASE, AMYLASE in the last 168 hours. No results for input(s): AMMONIA in the last 168 hours.  ABG    Component Value Date/Time   PHART 7.321 (L) 02/23/2022 0645   PCO2ART 49.8 (H) 02/23/2022 0645   PO2ART 141 (H) 02/23/2022 0645   HCO3 25.6 02/23/2022 0645   TCO2 27 02/23/2022 0645   ACIDBASEDEF 1.0 02/23/2022 0645   O2SAT 99 02/23/2022 0645     Coagulation  Profile: Recent Labs  Lab 03/03/2022 1836 02/23/22 0300  INR 0.7*   0.7* 1.1    Cardiac Enzymes: No results for input(s): CKTOTAL, CKMB, CKMBINDEX, TROPONINI in the last 168 hours.  HbA1C: Hgb A1c MFr Bld  Date/Time Value Ref Range Status  02/21/2022 04:13 AM 6.0 (H) 4.8 - 5.6 % Final    Comment:    (NOTE) Pre diabetes:          5.7%-6.4%  Diabetes:              >6.4%  Glycemic control for   <7.0% adults with diabetes   02/13/2022 03:58 AM 6.2 (H) 4.8 - 5.6 % Final    Comment:    (NOTE)         Prediabetes: 5.7 - 6.4         Diabetes: >6.4         Glycemic control for adults with diabetes: <7.0     CBG: Recent Labs  Lab 02/23/22 0256 02/23/22 0505 02/23/22 0630 02/23/22 0811 02/23/22 0908  GLUCAP 136* 141* 155* 139* 127*    Review of Systems:   Intubated/sedated  Past Medical History:  He,  has a past medical history of Chronic systolic CHF (congestive heart failure) (HCC), Hyperlipidemia, Hypertension, Mild mitral regurgitation (2016), NICM (nonischemic cardiomyopathy) (HCC), Obesity, and Sleep apnea (12/24/12).   Surgical History:   Past Surgical History:  Procedure Laterality Date   MULTIPLE EXTRACTIONS WITH ALVEOLOPLASTY N/A 03/10/2022   Procedure: MULTIPLE EXTRACTION WITH ALVEOLOPLASTY;  Surgeon: Ocie Doyne, DMD;  Location: [facility] OR;  Service: Dentistry;  Laterality: N/A;   RIGHT/LEFT HEART CATH AND CORONARY ANGIOGRAPHY N/A 01/25/2022   Procedure: RIGHT/LEFT HEART CATH AND CORONARY ANGIOGRAPHY;  Surgeon: Dolores Patty, MD;  Location: [facility] INVASIVE CV LAB;  Service: Cardiovascular;  Laterality: N/A;   TEE WITHOUT CARDIOVERSION N/A 04/18/2020   Procedure: TRANSESOPHAGEAL ECHOCARDIOGRAM (TEE);  Surgeon: Dolores Patty, MD;  Location: [facility] ENDOSCOPY;  Service: Cardiovascular;  Laterality: N/A;   VASECTOMY       Social History:   reports that he has been smoking cigarettes. He has a 1.10 pack-year smoking history. He has never used smokeless tobacco. He  reports that he does not drink alcohol and does not use drugs.   Family History:  His family history includes Arrhythmia in his maternal grandfather. There is no history of CAD.   Allergies No Known Allergies   Home Medications  Prior to Admission medications   Medication Sig Start Date End Date Taking? Authorizing Provider  albuterol (VENTOLIN HFA) 108 (90 Base) MCG/ACT inhaler Inhale 1 puff into the lungs every 6 (six) hours as needed. 09/13/21  Yes [provider]  allopurinol (ZYLOPRIM) 300 MG tablet Take 300 mg by mouth daily. 12/18/21  Yes [provider]  amLODipine (NORVASC) 10 MG tablet Take 1 tablet (10 mg total) by mouth daily. 11/08/19  Yes Dunn, Dayna N, PA-C  atorvastatin (LIPITOR) 10 MG tablet Take 1 tablet (10 mg total) by mouth daily. 11/08/19  Yes Dunn, Dayna N, PA-C  carvedilol (COREG) 25 MG tablet Take 2 tablets (50 mg total) by mouth 2 (two) times daily. 01/26/21  Yes Bensimhon, Bevelyn Buckles, MD  cetirizine (ZYRTEC) 10 MG tablet Take 10 mg by mouth daily.   Yes [provider]  colchicine 0.6 MG tablet Take 2 tablets at first sign of gout flare and then 1 tablet an hour later.  Repeat every 3 days until gout flare subsides. 11/20/20  Yes Bensimhon, Bevelyn Buckles, MD  dapagliflozin propanediol (FARXIGA) 10 MG TABS tablet Take 1 tablet (10 mg total) by mouth daily before breakfast. 06/28/20  Yes Bensimhon, Bevelyn Buckles, MD  Dextromethorphan-guaiFENesin (ROBITUSSIN DM PO) Take 30 mLs by mouth in the morning and at bedtime.   Yes [provider]  digoxin (LANOXIN) 0.125 MG tablet Take 1 tablet (0.125 mg total) by mouth daily. 11/20/20  Yes Bensimhon, Bevelyn Buckles, MD  hydrALAZINE (APRESOLINE) 100 MG tablet Take  1 tablet (100 mg total) by mouth 3 (three) times daily. 06/28/20  Yes Bensimhon, Bevelyn Buckles, MD  isosorbide mononitrate (IMDUR) 60 MG 24 hr tablet Take 1 tablet (60 mg total) by mouth daily. 03/22/20  Yes Bensimhon, Bevelyn Buckles, MD  JARDIANCE 10 MG TABS tablet  Take 10 mg by mouth daily. 12/18/21  Yes [provider]  metFORMIN (GLUCOPHAGE) 500 MG tablet Take 500 mg by mouth daily. 09/13/21  Yes [provider]  naproxen sodium (ALEVE) 220 MG tablet Take 220 mg by mouth as needed (pain, headache).   Yes [provider]  potassium chloride SA (KLOR-CON) 20 MEQ tablet Take 2 tablets (40 mEq total) by mouth 2 (two) times daily. 10/08/21  Yes Milford, Anderson Malta, FNP  spironolactone (ALDACTONE) 25 MG tablet Take 1 tablet (25 mg total) by mouth daily. 10/08/21  Yes Milford, Anderson Malta, FNP  torsemide (DEMADEX) 20 MG tablet Take 4 tablets (80 mg total) by mouth 2 (two) times daily. 10/04/21  Yes Bensimhon, Bevelyn Buckles, MD  Vitamin D, Ergocalciferol, (DRISDOL) 1.25 MG (50000 UNIT) CAPS capsule Take 50,000 Units by mouth every Monday. 08/14/20  Yes [provider]  metolazone (ZAROXOLYN) 2.5 MG tablet Take 1 tablet (2.5 mg total) by mouth as directed. By heart failure clinic Patient not taking: Reported on 02/15/2022 10/08/21   Jacklynn Ganong, FNP  sacubitril-valsartan (ENTRESTO) 97-103 MG Take 1 tablet by mouth 2 (two) times daily. Patient not taking: Reported on 02/10/2022 02/17/20   Bensimhon, Bevelyn Buckles, MD     Critical care time: 34 minutes

## 2022-02-23 NOTE — Progress Notes (Addendum)
Patient ID: Colin Walton, male   DOB: 1971-07-23, 51 y.o.   MRN: [id number]     Advanced Heart Failure Rounding Note  PCP-Cardiologist: Lauree Chandler, MD   Subjective:    2/17 admitted for a/c Biventricular HF, NYHA IIIb-IV symptoms, w/ low output. Stated on milrinone. LVAD work up initiated.  2/23 RHC/LHC on milrinone 0.125 mcg + Norepi 2 mcg w/ 2v CAD, Severe NICM EF < 20%, Elevated filling pressures with markedly reduced CO with severe AI 2/28 diuretics held for AKI  3/1 Multiple teeth extractions  3/3 HM-3 VAD placed with bioprosthetic AVR and SVG (off outflow graft) to RCA. Complicated OR case with multiple episodes VT/VF. Persistently coagulopathic.  MAP 70s this morning on NE 36, epinephrine 5, milrinone 0.375, vasopressin 0.04.  Weight up 20 lbs.  Creatinine stable 1.07.   WBCs 27, Tm 100.2.   On vent, FiO2 100%.   Post-op had both AF and VT, now on amiodarone gtt.   Swan: CVP 18 PA 54/27 PAPI 1.5 CI 2.4 Co-ox 75%  LVAD Interrogation HM 3: Speed: 5600 Flow: 4.8 PI: 2 Power: 4.2. Multiple PI events.     Objective:   Weight Range: 115.2 kg Body mass index is 39.78 kg/m.   Vital Signs:   Temp:  [99.9 F (37.7 C)-101.5 F (38.6 C)] 100.2 F (37.9 C) (03/04 0802) Pulse Rate:  [88-117] 117 (03/04 0802) Resp:  [0-25] 25 (03/04 0802) BP: (39-104)/(26-79) 39/31 (03/04 0630) SpO2:  [87 %-100 %] 97 % (03/04 0802) Arterial Line BP: (57-88)/(52-78) 88/74 (03/04 0700) FiO2 (%):  [100 %] 100 % (03/04 0802) Weight:  [115.2 kg] 115.2 kg (03/04 0645) Last BM Date :  [name]  Weight change: Filed Weights   02/21/22 0600 03/17/2022 0433 02/23/22 0645  Weight: 105.7 kg 105.8 kg 115.2 kg    Intake/Output:   Intake/Output Summary (Last 24 hours) at 02/23/2022 0922 Last data filed at 02/23/2022 0900 Gross per 24 hour  Intake 11735.54 ml  Output 3150 ml  Net 8585.54 ml    Physical Exam   General: Intubated/sedated.  HEENT: Normal. Neck: Supple, JVP 14+ cm.  Carotids OK.  Cardiac:  Mechanical heart sounds with LVAD hum present.  Lungs: Decreased at bases.  Abdomen:  NT, ND, no HSM. No bruits or masses. +BS  LVAD exit site: Well-healed and incorporated. Dressing dry and intact. No erythema or drainage. Stabilization device present and accurately applied. Driveline dressing changed daily per sterile technique. Extremities:  Warm and dry. No cyanosis, clubbing, rash. Trace ankle edema.  Neuro:  Sedated but will awaken and follow commands.   Telemetry   A-V paced in 90s (personally reviewed)   Labs    CBC Recent Labs    02/21/2022 2352 02/23/22 0001 02/23/22 0300 02/23/22 0645  WBC 21.9*  --  27.0*  --   NEUTROABS  --   --  23.0*  --   HGB 9.0*   < > 8.7* 8.2*  HCT 27.2*   < > 26.6* 24.0*  MCV 91.0  --  90.5  --   PLT 123*  --  127*  --    < > = values in this interval not displayed.   Basic Metabolic Panel Recent Labs    02/25/2022 1836 02/28/2022 1841 03/13/2022 2352 02/23/22 0001 02/23/22 0300 02/23/22 0645  NA 135   < >  --    < > 133* 132*  K 4.8   < >  --    < > 5.7* 5.6*  CL  104  --   --   --  101  --   CO2 26  --   --   --  24  --   GLUCOSE 154*  --   --   --  132*  --   BUN 13  --   --   --  14  --   CREATININE 0.96  --  1.10  --  1.07  --   CALCIUM 8.4*  --   --   --  8.2*  --   MG 2.0  --  2.6*  --  2.3  --   PHOS  --   --   --   --  3.9  --    < > = values in this interval not displayed.   Liver Function Tests Recent Labs    02/23/22 0300  AST 148*  ALT 30  ALKPHOS 52  BILITOT 3.8*  PROT 5.0*  ALBUMIN 3.4*     No results for input(s): LIPASE, AMYLASE in the last 72 hours. Cardiac Enzymes No results for input(s): CKTOTAL, CKMB, CKMBINDEX, TROPONINI in the last 72 hours.  BNP: BNP (last 3 results) Recent Labs    03/22/21 1106 02/07/2022 1649 02/11/22 0345  BNP 995.8* 1,303.2* 166.8*    ProBNP (last 3 results) No results for input(s): PROBNP in the last 8760 hours.   D-Dimer Recent Labs     02/23/2022 1836  DDIMER 1.04*   Hemoglobin A1C Recent Labs    02/21/22 0413  HGBA1C 6.0*    Fasting Lipid Panel No results for input(s): CHOL, HDL, LDLCALC, TRIG, CHOLHDL, LDLDIRECT in the last 72 hours.  Thyroid Function Tests No results for input(s): TSH, T4TOTAL, T3FREE, THYROIDAB in the last 72 hours.  Invalid input(s): FREET3   Other results:   Imaging    DG Chest Port 1 View  Result Date: 02/23/2022 CLINICAL DATA:  Status post CABG EXAM: PORTABLE CHEST 1 VIEW COMPARISON:  03/17/2022 FINDINGS: Endotracheal tube with the tip 4 cm above the carina. Nasogastric tube coursing below the diaphragm. Swan-Ganz catheter with the tip projecting over the right main pulmonary artery. Bilateral chest tubes in unchanged position. No pneumothorax. Bilateral mild interstitial thickening. Bilateral small pleural effusions. Bibasilar atelectasis. Stable cardiomegaly. Aortic valve replacement and median sternotomy. No acute osseous abnormality. IMPRESSION: 1. Support lines and tubing in satisfactory position. 2. Mild pulmonary vascular congestion. Bilateral small pleural effusions with bibasilar atelectasis. No pneumothorax. 3. Stable cardiomegaly. Electronically Signed   By: Kathreen Devoid M.D.   On: 02/23/2022 09:01   DG Chest Port 1 View  Result Date: 03/15/2022 CLINICAL DATA:  Postop. Status post CABG aortic valve replacement. Left ventricular assist device present. EXAM: PORTABLE CHEST 1 VIEW COMPARISON:  Preoperative radiograph 02/13/2022 FINDINGS: Patient is post median sternotomy with prosthetic cardiac valve. Endotracheal tube tip is approximately 6.6 cm from the carina just below the clavicular heads. Enteric tube is in place with tip and side-port below the diaphragm. Left ventricular assist device in place. There are bilateral chest tubes. Right internal jugular Swan-Ganz catheter tip overlies the main pulmonary outflow tract. Right upper lobe collapse. Ill-defined retrocardiac opacity likely  atelectasis and pleural fluid. Cardiomegaly is similar to preoperative exam. No visualized pneumothorax. IMPRESSION: 1. Post median sternotomy with prosthetic cardiac valve. Left ventricular device in place. Support apparatus as described. 2. Right upper lobe collapse. 3. Retrocardiac opacity likely atelectasis and pleural fluid. 4. Bilateral chest tubes in place. No visualized pneumothorax. Electronically Signed   By: Threasa Beards  Sanford M.D.   On: 02/28/2022 18:54     Medications:     Scheduled Medications:  (feeding supplement) PROSource Plus  30 mL Oral BID BM   sodium chloride   Intravenous Once   sodium chloride   Intravenous Once   acetaminophen  1,000 mg Oral Q6H   Or   acetaminophen (TYLENOL) oral liquid 160 mg/5 mL  1,000 mg Per Tube Q6H   allopurinol  300 mg Oral Daily   aspirin EC  325 mg Oral Daily   Or   aspirin  324 mg Per Tube Daily   Or   aspirin  300 mg Rectal Daily   atorvastatin  80 mg Oral Daily   bisacodyl  10 mg Oral Daily   Or   bisacodyl  10 mg Rectal Daily   chlorhexidine  15 mL Mouth Rinse BID   chlorhexidine  15 mL Mouth/Throat NOW   chlorhexidine gluconate (MEDLINE KIT)  15 mL Mouth Rinse BID   Chlorhexidine Gluconate Cloth  6 each Topical Daily   docusate  100 mg Per Tube BID   lactose free nutrition  237 mL Oral TID WC   mouth rinse  15 mL Mouth Rinse 10 times per day   [START ON 02/24/2022] pantoprazole  40 mg Oral Daily   polyethylene glycol  17 g Per Tube Daily   sodium chloride flush  3 mL Intravenous Q12H    Infusions:  sodium chloride Stopped (02/23/22 0840)   sodium chloride     sodium chloride     amiodarone 60 mg/hr (02/23/22 0900)    ceFAZolin (ANCEF) IV Stopped (02/23/22 0615)   dexmedetomidine (PRECEDEX) IV infusion 1 mcg/kg/hr (02/23/22 0900)   epinephrine 5 mcg/min (02/23/22 0700)   fentaNYL infusion INTRAVENOUS 100 mcg/hr (02/23/22 0900)   insulin 3 Units/hr (02/23/22 0900)   lactated ringers     lactated ringers 20 mL/hr at  02/23/22 0900   midazolam 2 mg/hr (02/23/22 0900)   milrinone 0.375 mcg/kg/min (02/23/22 0900)   norepinephrine (LEVOPHED) Adult infusion 36 mcg/min (02/23/22 0900)   vancomycin Stopped (02/24/2022 2118)   vasopressin 0.04 Units/min (02/23/22 0900)    PRN Medications: sodium chloride, dextrose, fentaNYL, midazolam, morphine injection, ondansetron (ZOFRAN) IV, oxyCODONE, sodium chloride flush, traMADol   Assessment/Plan   1. Acute on Chronic Biventricular Heart Failure-> cardiogenic shock - cMRI 8/21 EF 24% Moderate AI. No LGE or infiltrative process. - Echo 01/23/2022 EF < 20% with mild LV dilation, RV severely HK with moderate dilation and D-shaped septum, AI moderate to severe.  - Suspect CM related to HTN +/- ETOH. AI may be contributing.  - R/LHC 01/30/2022 3v CAD/ RHC with elevated filling pressures.  - Not transplant candidate due to smoking and lung disease - 3/3 HM-3 VAD placed with AVR and SVG (off outflow graft) to RCA - He remains on epinephrine 5, NE 36, milrinone 0.375, vasopressin 0.04. Cardiac output good this morning. Wean pressors as able.  - He is on NO with history of severe RV dysfunction, would continue for now.  - Creatinine stable with weight up 20 lbs and CVP high.  Lasix 40 mg IV x 1 with Lasix gtt 4 mg/hr to start afterwards.  Follow response.   2. HM-3 VAD - VAD interrogated personally. Parameters stable. - Probably will start heparin gtt tomorrow.   3. Acute hypoxic/post-op respiratory failure - Pre-op PFTs with severe obstruction and restriction, emphysema on CT chest.  Significant underlying COPD - Remains on FiO2 100, suspect very slow vent  wean, may need trach.  Discussed with Dr. Cyndia Bent, will ask CCM to help manage vent.   4. Acute coagulopathy - given multiple units of product in OR + 1/2 dose Factor 7 - follow closely - transfuse hgb < 8. 8.7 today.   5. Moderate-severe aortic regurgitation - Moderate to severe AI on echo 01/31/2022.  Suspect AI is not  cause of cardiomyopathy but is likely making symptoms worse.  - Bioprosthetic AVR with LVAD.   6. CAD - cath 3v CAD LAD 40% D1 90%, OM 90% mRCA 80% - SVG from outflow graft to RCA at time of VAD  7. VT/VF - continue IV amio gtt - Keep K >4.0 Mg > 2.0    8. OSA   9. Atrial fibrillation - Noted post-op.  On amiodarone gtt now and A-V sequential pacing.   10. ID - post-op fever Tm 100.2, WBCs 27.  No abx currently but follow fever and WBC curves closely.   CRITICAL CARE Performed by: Loralie Champagne  Total critical care time: 45 minutes  Critical care time was exclusive of separately billable procedures and treating other patients.  Critical care was necessary to treat or prevent imminent or life-threatening deterioration.  Critical care was time spent personally by me (independent of midlevel providers or residents) on the following activities: development of treatment plan with patient and/or surrogate as well as nursing, discussions with consultants, evaluation of patient's response to treatment, examination of patient, obtaining history from patient or surrogate, ordering and performing treatments and interventions, ordering and review of laboratory studies, ordering and review of radiographic studies, pulse oximetry and re-evaluation of patient's condition.  Length of Stay: [place], MD  02/23/2022, 9:22 AM  Advanced Heart Failure Team Pager [phone] (M-F; 7a - 5p)  Please contact [facility] for night-coverage after hours (5p -7a ) and weekends on [email or web address]   .

## 2022-02-23 NOTE — Progress Notes (Addendum)
eLink Physician-Brief Progress Note ?Patient Name: Candelario Belone ?DOB: [birth year] ?MRN: [id number] ? ? ?Date of Service ? 02/23/2022  ?HPI/Events of Note ? Postoperative ventilator management ?Mixed ischemic and nonischmemic CM, aortic regurgitation, Postop status 03/09/2022: HM-3 VAD placed with AVR and SVG (off outflow graft) to RCA ?Post cardiotomy shock improving ?Postop agitation, pain, and delirium ?OSA.  ? ?520/05/19/69%. ? ?ABG .  ?On levo 50, vaso, epi 8. Milrinone, on NO ?Fenta boluses and on 200. Versed 6 mg.  ? ?Smoker as per RN discussion.   ?eICU Interventions ? - increase PEEP to 8increase Versed to 8 and fenta max 300 follow ABG at 1 AM.    ? ? ? ?Intervention Category ?Intermediate Interventions: Respiratory distress - evaluation and management ? ?Elmer Sow ?02/23/2022, 10:07 PM ? ?01:01 ?ABG improving respiratory acidosis and P/F ratio ?Continue same setting ? ?4:44 ?AM ABG reviewed ?Allow permissive hypercapnea for lung protective ventilation for now. ?Hco3 at 24. Stable acidosis.  ? ? ?

## 2022-02-23 NOTE — Anesthesia Postprocedure Evaluation (Signed)
Anesthesia Post Note ? ?Patient: Celeste Candelas ? ?Procedure(s) Performed: INSERTION OF IMPLANTABLE LEFT VENTRICULAR ASSIST DEVICE USING THORATEC HEARTMATE 3 (Chest) ?AORTIC VALVE REPAIR USING INSPIRIS RESILIA  AORTIC VALVE (Chest) ?CORONARY ARTERY BYPASS GRAFTING (CABG) TIMES ONE, USING ENDOSCOPICALLY HARVESTED RIGHT GREATER SAPHENOUS VEIN (Chest) ?TRANSESOPHAGEAL ECHOCARDIOGRAM (TEE) ?ENDOVEIN HARVEST OF GREATER SAPHENOUS VEIN (Right: Leg Lower) ? ?  ? ?Patient location during evaluation: SICU ?Anesthesia Type: General ?Level of consciousness: sedated ?Pain management: pain level controlled ?Vital Signs Assessment: post-procedure vital signs reviewed and stable ?Respiratory status: patient remains intubated per anesthesia plan ?Cardiovascular status: stable ?Postop Assessment: no apparent nausea or vomiting ?Anesthetic complications: no ? ? ?No notable events documented. ? ?Last Vitals:  ?Vitals:  ? 02/23/22 1415 02/23/22 1430  ?BP:  (!) 86/49  ?Pulse:    ?Resp: (!) 28 (!) 28  ?Temp: (!) 38 ?C (!) 38 ?C  ?SpO2:    ?  ?Last Pain:  ?Vitals:  ? 02/23/22 1200  ?TempSrc: Core  ?PainSc:   ? ? ?  ?  ?  ?  ?  ?  ? ?Bernardine Langworthy ? ? ? ? ?

## 2022-02-24 ENCOUNTER — Inpatient Hospital Stay (HOSPITAL_COMMUNITY): Payer: 59

## 2022-02-24 DIAGNOSIS — I5023 Acute on chronic systolic (congestive) heart failure: Secondary | ICD-10-CM | POA: Diagnosis not present

## 2022-02-24 LAB — CBC
HCT: 24.1 % — ABNORMAL LOW (ref 39.0–52.0)
Hemoglobin: 8.1 g/dL — ABNORMAL LOW (ref 13.0–17.0)
MCH: 31 pg (ref 26.0–34.0)
MCHC: 33.6 g/dL (ref 30.0–36.0)
MCV: 92.3 fL (ref 80.0–100.0)
Platelets: 144 10*3/uL — ABNORMAL LOW (ref 150–400)
RBC: 2.61 MIL/uL — ABNORMAL LOW (ref 4.22–5.81)
RDW: 14.7 % (ref 11.5–15.5)
WBC: 30.9 10*3/uL — ABNORMAL HIGH (ref 4.0–10.5)
nRBC: 0 % (ref 0.0–0.2)

## 2022-02-24 LAB — POCT I-STAT 7, (LYTES, BLD GAS, ICA,H+H)
Acid-Base Excess: 1 mmol/L (ref 0.0–2.0)
Acid-base deficit: 1 mmol/L (ref 0.0–2.0)
Acid-base deficit: 1 mmol/L (ref 0.0–2.0)
Acid-base deficit: 3 mmol/L — ABNORMAL HIGH (ref 0.0–2.0)
Acid-base deficit: 3 mmol/L — ABNORMAL HIGH (ref 0.0–2.0)
Acid-base deficit: 5 mmol/L — ABNORMAL HIGH (ref 0.0–2.0)
Bicarbonate: 23.7 mmol/L (ref 20.0–28.0)
Bicarbonate: 23.9 mmol/L (ref 20.0–28.0)
Bicarbonate: 24.8 mmol/L (ref 20.0–28.0)
Bicarbonate: 26.8 mmol/L (ref 20.0–28.0)
Bicarbonate: 27.5 mmol/L (ref 20.0–28.0)
Bicarbonate: 29.1 mmol/L — ABNORMAL HIGH (ref 20.0–28.0)
Calcium, Ion: 1.16 mmol/L (ref 1.15–1.40)
Calcium, Ion: 1.16 mmol/L (ref 1.15–1.40)
Calcium, Ion: 1.16 mmol/L (ref 1.15–1.40)
Calcium, Ion: 1.2 mmol/L (ref 1.15–1.40)
Calcium, Ion: 1.22 mmol/L (ref 1.15–1.40)
Calcium, Ion: 1.25 mmol/L (ref 1.15–1.40)
HCT: 22 % — ABNORMAL LOW (ref 39.0–52.0)
HCT: 23 % — ABNORMAL LOW (ref 39.0–52.0)
HCT: 24 % — ABNORMAL LOW (ref 39.0–52.0)
HCT: 24 % — ABNORMAL LOW (ref 39.0–52.0)
HCT: 24 % — ABNORMAL LOW (ref 39.0–52.0)
HCT: 25 % — ABNORMAL LOW (ref 39.0–52.0)
Hemoglobin: 7.5 g/dL — ABNORMAL LOW (ref 13.0–17.0)
Hemoglobin: 7.8 g/dL — ABNORMAL LOW (ref 13.0–17.0)
Hemoglobin: 8.2 g/dL — ABNORMAL LOW (ref 13.0–17.0)
Hemoglobin: 8.2 g/dL — ABNORMAL LOW (ref 13.0–17.0)
Hemoglobin: 8.2 g/dL — ABNORMAL LOW (ref 13.0–17.0)
Hemoglobin: 8.5 g/dL — ABNORMAL LOW (ref 13.0–17.0)
O2 Saturation: 91 %
O2 Saturation: 91 %
O2 Saturation: 92 %
O2 Saturation: 93 %
O2 Saturation: 93 %
O2 Saturation: 93 %
Patient temperature: 35.9
Patient temperature: 36
Patient temperature: 36.9
Patient temperature: 37.7
Patient temperature: 37.8
Patient temperature: 37.8
Potassium: 5.1 mmol/L (ref 3.5–5.1)
Potassium: 5.3 mmol/L — ABNORMAL HIGH (ref 3.5–5.1)
Potassium: 5.3 mmol/L — ABNORMAL HIGH (ref 3.5–5.1)
Potassium: 5.4 mmol/L — ABNORMAL HIGH (ref 3.5–5.1)
Potassium: 5.5 mmol/L — ABNORMAL HIGH (ref 3.5–5.1)
Potassium: 5.9 mmol/L — ABNORMAL HIGH (ref 3.5–5.1)
Sodium: 131 mmol/L — ABNORMAL LOW (ref 135–145)
Sodium: 132 mmol/L — ABNORMAL LOW (ref 135–145)
Sodium: 132 mmol/L — ABNORMAL LOW (ref 135–145)
Sodium: 132 mmol/L — ABNORMAL LOW (ref 135–145)
Sodium: 134 mmol/L — ABNORMAL LOW (ref 135–145)
Sodium: 135 mmol/L (ref 135–145)
TCO2: 25 mmol/L (ref 22–32)
TCO2: 26 mmol/L (ref 22–32)
TCO2: 27 mmol/L (ref 22–32)
TCO2: 29 mmol/L (ref 22–32)
TCO2: 29 mmol/L (ref 22–32)
TCO2: 31 mmol/L (ref 22–32)
pCO2 arterial: 52.5 mmHg — ABNORMAL HIGH (ref 32–48)
pCO2 arterial: 61.1 mmHg — ABNORMAL HIGH (ref 32–48)
pCO2 arterial: 63.6 mmHg — ABNORMAL HIGH (ref 32–48)
pCO2 arterial: 63.9 mmHg — ABNORMAL HIGH (ref 32–48)
pCO2 arterial: 68.1 mmHg (ref 32–48)
pCO2 arterial: 68.4 mmHg (ref 32–48)
pH, Arterial: 7.153 — CL (ref 7.35–7.45)
pH, Arterial: 7.203 — ABNORMAL LOW (ref 7.35–7.45)
pH, Arterial: 7.22 — ABNORMAL LOW (ref 7.35–7.45)
pH, Arterial: 7.238 — ABNORMAL LOW (ref 7.35–7.45)
pH, Arterial: 7.262 — ABNORMAL LOW (ref 7.35–7.45)
pH, Arterial: 7.27 — ABNORMAL LOW (ref 7.35–7.45)
pO2, Arterial: 68 mmHg — ABNORMAL LOW (ref 83–108)
pO2, Arterial: 69 mmHg — ABNORMAL LOW (ref 83–108)
pO2, Arterial: 81 mmHg — ABNORMAL LOW (ref 83–108)
pO2, Arterial: 82 mmHg — ABNORMAL LOW (ref 83–108)
pO2, Arterial: 85 mmHg (ref 83–108)
pO2, Arterial: 86 mmHg (ref 83–108)

## 2022-02-24 LAB — GLUCOSE, CAPILLARY
Glucose-Capillary: 101 mg/dL — ABNORMAL HIGH (ref 70–99)
Glucose-Capillary: 105 mg/dL — ABNORMAL HIGH (ref 70–99)
Glucose-Capillary: 110 mg/dL — ABNORMAL HIGH (ref 70–99)
Glucose-Capillary: 115 mg/dL — ABNORMAL HIGH (ref 70–99)
Glucose-Capillary: 122 mg/dL — ABNORMAL HIGH (ref 70–99)
Glucose-Capillary: 122 mg/dL — ABNORMAL HIGH (ref 70–99)
Glucose-Capillary: 123 mg/dL — ABNORMAL HIGH (ref 70–99)
Glucose-Capillary: 123 mg/dL — ABNORMAL HIGH (ref 70–99)
Glucose-Capillary: 127 mg/dL — ABNORMAL HIGH (ref 70–99)
Glucose-Capillary: 133 mg/dL — ABNORMAL HIGH (ref 70–99)
Glucose-Capillary: 140 mg/dL — ABNORMAL HIGH (ref 70–99)
Glucose-Capillary: 96 mg/dL (ref 70–99)
Glucose-Capillary: 97 mg/dL (ref 70–99)
Glucose-Capillary: 98 mg/dL (ref 70–99)

## 2022-02-24 LAB — CBC WITH DIFFERENTIAL/PLATELET
Abs Immature Granulocytes: 0.4 10*3/uL — ABNORMAL HIGH (ref 0.00–0.07)
Basophils Absolute: 0.4 10*3/uL — ABNORMAL HIGH (ref 0.0–0.1)
Basophils Relative: 1 %
Eosinophils Absolute: 0 10*3/uL (ref 0.0–0.5)
Eosinophils Relative: 0 %
HCT: 24.1 % — ABNORMAL LOW (ref 39.0–52.0)
Hemoglobin: 7.9 g/dL — ABNORMAL LOW (ref 13.0–17.0)
Lymphocytes Relative: 5 %
Lymphs Abs: 1.9 10*3/uL (ref 0.7–4.0)
MCH: 30 pg (ref 26.0–34.0)
MCHC: 32.8 g/dL (ref 30.0–36.0)
MCV: 91.6 fL (ref 80.0–100.0)
Monocytes Absolute: 3.9 10*3/uL — ABNORMAL HIGH (ref 0.1–1.0)
Monocytes Relative: 10 %
Myelocytes: 1 %
Neutro Abs: 32 10*3/uL — ABNORMAL HIGH (ref 1.7–7.7)
Neutrophils Relative %: 83 %
Platelets: 158 10*3/uL (ref 150–400)
RBC: 2.63 MIL/uL — ABNORMAL LOW (ref 4.22–5.81)
RDW: 14.6 % (ref 11.5–15.5)
WBC: 38.5 10*3/uL — ABNORMAL HIGH (ref 4.0–10.5)
nRBC: 0 % (ref 0.0–0.2)
nRBC: 0 /100 WBC

## 2022-02-24 LAB — PROTIME-INR
INR: 1.3 — ABNORMAL HIGH (ref 0.8–1.2)
Prothrombin Time: 15.8 seconds — ABNORMAL HIGH (ref 11.4–15.2)

## 2022-02-24 LAB — RENAL FUNCTION PANEL
Albumin: 2.9 g/dL — ABNORMAL LOW (ref 3.5–5.0)
Anion gap: 11 (ref 5–15)
BUN: 22 mg/dL — ABNORMAL HIGH (ref 6–20)
CO2: 25 mmol/L (ref 22–32)
Calcium: 7.7 mg/dL — ABNORMAL LOW (ref 8.9–10.3)
Chloride: 99 mmol/L (ref 98–111)
Creatinine, Ser: 2.13 mg/dL — ABNORMAL HIGH (ref 0.61–1.24)
GFR, Estimated: 37 mL/min — ABNORMAL LOW (ref >60)
Glucose, Bld: 121 mg/dL — ABNORMAL HIGH (ref 70–99)
Phosphorus: 5.8 mg/dL — ABNORMAL HIGH (ref 2.5–4.6)
Potassium: 5.2 mmol/L — ABNORMAL HIGH (ref 3.5–5.1)
Sodium: 135 mmol/L (ref 135–145)

## 2022-02-24 LAB — COMPREHENSIVE METABOLIC PANEL
ALT: 25 U/L (ref 0–44)
AST: 124 U/L — ABNORMAL HIGH (ref 15–41)
Albumin: 2.7 g/dL — ABNORMAL LOW (ref 3.5–5.0)
Alkaline Phosphatase: 56 U/L (ref 38–126)
Anion gap: 9 (ref 5–15)
BUN: 21 mg/dL — ABNORMAL HIGH (ref 6–20)
CO2: 24 mmol/L (ref 22–32)
Calcium: 8.2 mg/dL — ABNORMAL LOW (ref 8.9–10.3)
Chloride: 99 mmol/L (ref 98–111)
Creatinine, Ser: 1.68 mg/dL — ABNORMAL HIGH (ref 0.61–1.24)
GFR, Estimated: 50 mL/min — ABNORMAL LOW (ref >60)
Glucose, Bld: 114 mg/dL — ABNORMAL HIGH (ref 70–99)
Potassium: 5.5 mmol/L — ABNORMAL HIGH (ref 3.5–5.1)
Sodium: 132 mmol/L — ABNORMAL LOW (ref 135–145)
Total Bilirubin: 4.4 mg/dL — ABNORMAL HIGH (ref 0.3–1.2)
Total Protein: 5.1 g/dL — ABNORMAL LOW (ref 6.5–8.1)

## 2022-02-24 LAB — MAGNESIUM: Magnesium: 2 mg/dL (ref 1.7–2.4)

## 2022-02-24 LAB — PREPARE RBC (CROSSMATCH)

## 2022-02-24 LAB — PHOSPHORUS: Phosphorus: 5.3 mg/dL — ABNORMAL HIGH (ref 2.5–4.6)

## 2022-02-24 LAB — COOXEMETRY PANEL
Carboxyhemoglobin: 1.1 % (ref 0.5–1.5)
Methemoglobin: 1.2 % (ref 0.0–1.5)
O2 Saturation: 70.5 %
Total hemoglobin: 7.1 g/dL — CL (ref 12.0–16.0)

## 2022-02-24 LAB — VANCOMYCIN, RANDOM: Vancomycin Rm: 19

## 2022-02-24 LAB — LACTATE DEHYDROGENASE: LDH: 416 U/L — ABNORMAL HIGH (ref 98–192)

## 2022-02-24 MED ORDER — MILRINONE LACTATE IN DEXTROSE 20-5 MG/100ML-% IV SOLN
0.1250 ug/kg/min | INTRAVENOUS | Status: DC
  Administered 2022-02-24 (×2): 0.125 ug/kg/min via INTRAVENOUS
  Filled 2022-02-24: qty 100

## 2022-02-24 MED ORDER — SODIUM BICARBONATE 8.4 % IV SOLN
100.0000 meq | Freq: Once | INTRAVENOUS | Status: AC
  Administered 2022-02-24: 100 meq via INTRAVENOUS

## 2022-02-24 MED ORDER — ALBUMIN HUMAN 25 % IV SOLN
50.0000 g | Freq: Once | INTRAVENOUS | Status: AC
  Administered 2022-02-24: 50 g via INTRAVENOUS
  Filled 2022-02-24: qty 200

## 2022-02-24 MED ORDER — SODIUM CHLORIDE 0.9 % IV SOLN: 2.0000 g | Freq: Three times a day (TID) | INTRAVENOUS | Status: DC

## 2022-02-24 MED ORDER — SODIUM ZIRCONIUM CYCLOSILICATE 5 G PO PACK
5.0000 g | PACK | Freq: Once | ORAL | Status: AC
  Administered 2022-02-24: 5 g via ORAL
  Filled 2022-02-24: qty 1

## 2022-02-24 MED ORDER — HEPARIN SODIUM (PORCINE) 1000 UNIT/ML DIALYSIS
1000.0000 [IU] | INTRAMUSCULAR | Status: DC | PRN
  Filled 2022-02-24 (×2): qty 6

## 2022-02-24 MED ORDER — HEPARIN (PORCINE) 25000 UT/250ML-% IV SOLN
500.0000 [IU]/h | INTRAVENOUS | Status: DC
  Administered 2022-02-24 – 2022-02-26 (×2): 500 [IU]/h via INTRAVENOUS
  Filled 2022-02-24 (×2): qty 250

## 2022-02-24 MED ORDER — VANCOMYCIN HCL IN DEXTROSE 1-5 GM/200ML-% IV SOLN
1000.0000 mg | INTRAVENOUS | Status: DC
  Administered 2022-02-24 – 2022-02-25 (×2): 1000 mg via INTRAVENOUS
  Filled 2022-02-24 (×2): qty 200

## 2022-02-24 MED ORDER — SODIUM CHLORIDE 0.9 % IV SOLN
0.0000 ug/min | INTRAVENOUS | Status: DC
  Administered 2022-02-24: 8 ug/min via INTRAVENOUS
  Administered 2022-02-25: 12 ug/min via INTRAVENOUS
  Administered 2022-02-25: 10 ug/min via INTRAVENOUS
  Administered 2022-02-26: 12 ug/min via INTRAVENOUS
  Filled 2022-02-24 (×4): qty 10

## 2022-02-24 MED ORDER — PRISMASOL BGK 4/2.5 32-4-2.5 MEQ/L REPLACEMENT SOLN: Status: DC

## 2022-02-24 MED ORDER — HEPARIN SODIUM (PORCINE) 1000 UNIT/ML DIALYSIS: 1000.0000 [IU] | INTRAMUSCULAR | Status: DC | PRN

## 2022-02-24 MED ORDER — PANTOPRAZOLE SODIUM 40 MG IV SOLR
40.0000 mg | Freq: Every day | INTRAVENOUS | Status: DC
  Administered 2022-02-24 – 2022-02-25 (×2): 40 mg via INTRAVENOUS
  Filled 2022-02-24 (×2): qty 10

## 2022-02-24 MED ORDER — PHENYLEPHRINE HCL-NACL 20-0.9 MG/250ML-% IV SOLN
0.0000 ug/min | INTRAVENOUS | Status: DC
  Administered 2022-02-25: 10 ug/min via INTRAVENOUS
  Administered 2022-02-26: 40 ug/min via INTRAVENOUS
  Administered 2022-02-26 (×2): 45 ug/min via INTRAVENOUS
  Filled 2022-02-24 (×5): qty 250

## 2022-02-24 MED ORDER — SODIUM BICARBONATE 8.4 % IV SOLN
100.0000 meq | Freq: Once | INTRAVENOUS | Status: AC
  Administered 2022-02-24: 100 meq via INTRAVENOUS
  Filled 2022-02-24: qty 100

## 2022-02-24 MED ORDER — PRISMASOL BGK 4/2.5 32-4-2.5 MEQ/L EC SOLN: Status: DC

## 2022-02-24 MED ORDER — METHYLENE BLUE 0.5 % INJ SOLN
100.0000 mg | Freq: Once | Status: AC
  Administered 2022-02-24: 100 mg via INTRAVENOUS
  Filled 2022-02-24: qty 20

## 2022-02-24 MED ORDER — SODIUM CHLORIDE 0.9% IV SOLUTION: Freq: Once | INTRAVENOUS | Status: AC

## 2022-02-24 MED ORDER — SODIUM CHLORIDE 0.9 % IV SOLN
2.0000 g | Freq: Two times a day (BID) | INTRAVENOUS | Status: DC
  Administered 2022-02-24 – 2022-02-25 (×4): 2 g via INTRAVENOUS
  Filled 2022-02-24 (×4): qty 2

## 2022-02-24 MED ORDER — SODIUM BICARBONATE 8.4 % IV SOLN
INTRAVENOUS | Status: DC
Start: 2022-02-24 — End: 2022-02-24

## 2022-02-24 NOTE — Procedures (Signed)
Pt has started CRRT. Discussed with CCM, AHF, Pharmacy and Nursing.  Sig hypotension and persistent acidosis.  All 4K bath.  K 5.5, if climbs further will need to use lower K on post fluid.  ? ?Increased Pre/D/Post rates to increase clearance.  Use 50-138mEq pushes for hypotension, trying to avoid volume of starting a NaHCO3 gtt. ? ?50gm albumin for oncotic pressure.  Consider transfusion as well. ? ?No UF currently.   ? ?Grim prognosis.  ?

## 2022-02-24 NOTE — Progress Notes (Signed)
? ?NAMEDillon Walton, MRN:  [id number], DOB:  February 23, 1971, LOS: 16 ?ADMISSION DATE:  01/23/2022, CONSULTATION DATE:  02/23/22 ?REFERRING MD:  TCTS, CHIEF COMPLAINT:  SOB  ? ?History of Present Illness:  ?51 year old man w/ hx of HTN, NICM, OSA on CPAP, smoking/EtOH abuse who presented with decompensated heart failure. Despite optimization remained in a symptomatic low slow state so underwent CABG x 1 outflow graft to RCA, aortic valve replacement and Heartmate 3 LVAD on Mar 11, 2022.  Procedure complicated by persistent coagulopathy and multiple runs of VF/VT.  Required amio, multiple blood preducts.  Eventually weaned off ECMO and brought to [facility].  Overnight issues with sedation and vent desynchrony requiring multitude of pushes of versed/fentanyl ? ?PCCM consulted to assist with vent management. ? ?Preop workup ?CT Chest bullous upper lobe emphysema with lower lungs preserved ?PFTs look mixed obstructive/restrictive with partially correcting but still low DLCO ? ?Pertinent  Medical History  ?NICM ?HLD ?HTN ?OSA ? ?Significant Hospital Events: ?Including procedures, antibiotic start and stop dates in addition to other pertinent events   ?Copied from CHF team note ?2/17 admitted for a/c Biventricular HF, NYHA IIIb-IV symptoms, w/ low output. Stated on milrinone. LVAD work up initiated.  ?2/23 RHC/LHC on milrinone 0.125 mcg + Norepi 2 mcg w/ 2v CAD, Severe NICM EF < 20%, Elevated filling pressures with markedly reduced CO with severe AI ?2/28 diuretics held for AKI  ?3/1 Multiple teeth extractions  ?3/3 HM-3 VAD placed with AVR and SVG (off outflow graft) to RCA ? ?Interim History / Subjective:  ?Consulted ?Patient agitated on vent. ? ?Objective   ?Blood pressure (!) 76/62, pulse 95, temperature 100.2 ?F (37.9 ?C), resp. rate (!) 28, height 5\' 7"  (1.702 m), weight 117 kg, SpO2 96 %. ?PAP: (47-79)/(22-41) 72/32 ?CVP:  [12 mmHg-64 mmHg] 20 mmHg ?CO:  [5.2 L/min-7.8 L/min] 7.4 L/min ?CI:  [2.4 L/min/m2-3.6 L/min/m2] 3.4  L/min/m2  ?Vent Mode: PRVC ?FiO2 (%):  [70 %-100 %] 80 % ?Set Rate:  [17 bmp-28 bmp] 28 bmp ?Vt Set:  [520 mL] 520 mL ?PEEP:  [5 cmH20-8 cmH20] 8 cmH20 ?Plateau Pressure:  [20 cmH20-29 cmH20] 26 cmH20  ? ?Intake/Output Summary (Last 24 hours) at 02/24/2022 0859 ?Last data filed at 02/24/2022 0800 ?Gross per 24 hour  ?Intake 6569.86 ml  ?Output 3595 ml  ?Net 2974.86 ml  ? ? ?Filed Weights  ? 03/11/2022 0433 02/23/22 0645 02/24/22 0600  ?Weight: 105.8 kg 115.2 kg 117 kg  ? ? ?Examination: ?Ill appearing man with severe anasarca ?LVAD hum to auscultation ?Ext warm ?Intubated and heavily sedated to vent synchrony RASS -4 ? ?Current drips: ?Insulin, milrinone, amiodarone, vasopressin, levophed, versed, fent, lasix ? ?ABG remains acidemic ?K remains a bit high ?LFT stable ?WBC up, Hgb down slightly, plts stable ?INR mildly elevated ?CXR stable ? ?Resolved Hospital Problem list   ?N/A ? 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 ?- Diuretics and inotropes per TCTS and CHF teams, may need to do CRRT today ?- Lung protective TV, will need some permissive hypercapnea while we work on fluid status ?- Abx  per CHF team ?- Continue brovana/yupelri ? ?Best Practice (right click and "Reselect all SmartList Selections" daily)  ?Per TCTS ? ?35 min cc time ?Myrla Halsted MD PCCM ? ? ? ?

## 2022-02-24 NOTE — Progress Notes (Signed)
Drive line dressing change:  ? LVAD driveline dressing was changed using proper sterile technique. Old dressing removed. Driveline site cleaned using chloroprep x2. Silver strip applied. New gauze dressing applied using sterile technique. Driveline secured at driveline site. Driveline site bloody with old and new drainage, no redness, no swelling, no tenderness, no odor from drainage. Driveline anchor intact and applied correctly.  ?  ?NEXT DRESSING CHANGE DUE 02/25/2022.  ?  ?Tenna Child RN   ?

## 2022-02-24 NOTE — Progress Notes (Signed)
PT Cancellation Note ? ?Patient Details ?Name: Colin Walton ?MRN: [id number] ?DOB: 10-01-1971 ? ? ?Cancelled Treatment:    Reason Eval/Treat Not Completed: Patient not medically ready. RN reports that pt is dyssynchronous with the vent and gets agitated when awake. Will plan to follow-up another day when appropriate. ? ? ?Raymond Gurney, PT, DPT ?Acute Rehabilitation Services  ?Pager: [phone] ?Office: [phone] ? ? ? ?Colin Walton ?02/24/2022, 7:59 AM ? ? ?

## 2022-02-24 NOTE — Progress Notes (Signed)
Pharmacy Antibiotic Note ? ?Colin Walton is a 52 y.o. male admitted on 02/19/2022. Pharmacy has been consulted for vancomycin dosing VAD surgical prophylaxis. Vancomycin 1500mg  given in procedure and Vancomycin 1gm q12x3 doses given post op.  WBC up 38 Tm 101 - inflammatory response vs sepsis,  BCx drawn and will continue ABX ?CRRT starting for fluid removal and increasing Cr 1.6  ?VT drawn 12hr after last dose 19  ?Plan: ?Vancomycin 1000mg  q24h while on CRRT ?Cefepime 2gm IV q12h ? ? ? ?Height: 5\' 7"  (170.2 cm) ?Weight: 117 kg (257 lb 15 oz) ?IBW/kg (Calculated) : 66.1 ? ?Temp (24hrs), Avg:100 ?F (37.8 ?C), Min:98.4 ?F (36.9 ?C), Max:100.8 ?F (38.2 ?C) ? ?Recent Labs  ?Lab 02/27/2022 ?2352 02/23/22 ?0300 02/23/22 ?1014 02/23/22 ?1657 02/23/22 ?2127 02/24/22 ?04/25/22 02/24/22 ?1117  ?WBC 21.9* 27.0*  --  33.9* 35.9* 38.5*  --   ?CREATININE 1.10 1.07 1.14 1.33* 1.37* 1.68*  --   ?VANCORANDOM  --   --   --   --   --   --  19  ? ?  ?Estimated Creatinine Clearance: 65.1 mL/min (A) (by C-G formula based on SCr of 1.68 mg/dL (H)).   ? ?No Known Allergies ? ?Antimicrobials this admission: ? ?Dose adjustments this admission: ? ?Microbiology results: ? ? ?04/26/22 Pharm.D. CPP, BCPS ?Clinical Pharmacist ?[phone] ?02/24/2022 1:27 PM  ? ? ? ?

## 2022-02-24 NOTE — Progress Notes (Addendum)
eLink Physician-Brief Progress Note ?Patient Name: Colin Walton ?DOB: 09-22-1971 ?MRN: [id number] ? ? ?Date of Service ? 02/24/2022  ?HPI/Events of Note ? ABG's notified on [facility] 80/8/28/520  ?eICU Interventions ? Already on 8 cc/kg IBW and is breathing with the vent. Critically ill and on CRRT ? ?Increase RR to 32 ?Not much room for anything else in terms of vent support  ?Recheck ABG at 2 am   ? ? ? ?Intervention Category ?Major Interventions: Respiratory failure - evaluation and management ? ?Colin Walton ?02/24/2022, 8:48 PM ? ?Addendum at 3:20 am ?Notified of ABG ?Slowly improved and as maintaining Ph > 7.25, will leave for now. We are on fairly max settings on the vent,  ?

## 2022-02-24 NOTE — Consult Note (Addendum)
Colin Walton Admit Date: 01/30/2022 02/24/2022 Colin Walton Requesting Physician:  Colin Crow MD  Reason for Consult:  AKI HPI:  82O complicated course including original presentation last month with biventricular CHF with cardiogenic shock who progressed to LVAD placement with bioprosthetic aortic valve replacement on 03/20/2022.  Operative course complicated by V. tach/V-fib, Since  LVAD he has had persistent shock on high-dose norepinephrine, vasopressin, and epinephrine, milrinone.  Persistent hypoxic respiratory failure on 0.8 FiO2, inhaled nitric oxide.  Baseline normal GFR which has worsened to a value of 1.7.  Attempts at diuretics including continuous Lasix drip with inadequate urine output for the clinical needs with particular reduction in UOP over the past several hours despite increased Lasix dosing, potassium has climbed to 5.5.  Because of worsening volume status and failure to diurese we have been consulted to consider CRRT.  His weight is up 11 kg from presentation.  PMH Incudes: Hypertension OSA History of alcohol use  I/Os: I/O last 3 completed shifts: In: 10464.8 [I.V.:7636.7; Blood:315; NG/GT:440; IV Piggyback:2063.1] Out: 54 [Urine:2310; Emesis/NG output:1350; Chest Tube:2120]   ROS unable to complete review of systems, patient intubated and sedated  PMH  Past Medical History:  Diagnosis Date   Chronic systolic CHF (congestive heart failure) (HCC)    Hyperlipidemia    Hypertension    Mild mitral regurgitation 2016   NICM (nonischemic cardiomyopathy) ([place])    Obesity    Sleep apnea 12/24/12   Sleep study February 2014   [place]  Past Surgical History:  Procedure Laterality Date   MULTIPLE EXTRACTIONS WITH ALVEOLOPLASTY N/A 02/25/2022   Procedure: MULTIPLE EXTRACTION WITH ALVEOLOPLASTY;  Surgeon: Diona Browner, DMD;  Location: [facility];  Service: Dentistry;  Laterality: N/A;   RIGHT/LEFT HEART CATH AND CORONARY ANGIOGRAPHY N/A 02/16/2022   Procedure: RIGHT/LEFT  HEART CATH AND CORONARY ANGIOGRAPHY;  Surgeon: Jolaine Artist, MD;  Location: [facility];  Service: Cardiovascular;  Laterality: N/A;   TEE WITHOUT CARDIOVERSION N/A 04/18/2020   Procedure: TRANSESOPHAGEAL ECHOCARDIOGRAM (TEE);  Surgeon: Jolaine Artist, MD;  Location: [facility];  Service: Cardiovascular;  Laterality: N/A;   VASECTOMY     FH  Family History  Problem Relation Age of Onset   Arrhythmia Maternal Grandfather        Atrial fibrillation   CAD Neg Hx    SH  reports that he has been smoking cigarettes. He has a 1.10 pack-year smoking history. He has never used smokeless tobacco. He reports that he does not drink alcohol and does not use drugs. Allergies No Known Allergies Home medications Prior to Admission medications   Medication Sig Start Date End Date Taking? Authorizing Provider  albuterol (VENTOLIN HFA) 108 (90 Base) MCG/ACT inhaler Inhale 1 puff into the lungs every 6 (six) hours as needed. 09/13/21  Yes [provider]  allopurinol (ZYLOPRIM) 300 MG tablet Take 300 mg by mouth daily. 12/18/21  Yes [provider]  amLODipine (NORVASC) 10 MG tablet Take 1 tablet (10 mg total) by mouth daily. 11/08/19  Yes Dunn, Dayna N, PA-C  atorvastatin (LIPITOR) 10 MG tablet Take 1 tablet (10 mg total) by mouth daily. 11/08/19  Yes Dunn, Dayna N, PA-C  carvedilol (COREG) 25 MG tablet Take 2 tablets (50 mg total) by mouth 2 (two) times daily. 01/26/21  Yes Bensimhon, Shaune Pascal, MD  cetirizine (ZYRTEC) 10 MG tablet Take 10 mg by mouth daily.   Yes [provider]  colchicine 0.6 MG tablet Take 2 tablets at first sign of  gout flare and then 1 tablet an hour later.  Repeat every 3 days until gout flare subsides. 11/20/20  Yes Bensimhon, Shaune Pascal, MD  dapagliflozin propanediol (FARXIGA) 10 MG TABS tablet Take 1 tablet (10 mg total) by mouth daily before breakfast. 06/28/20  Yes Bensimhon, Shaune Pascal, MD  Dextromethorphan-guaiFENesin (ROBITUSSIN DM PO) Take  30 mLs by mouth in the morning and at bedtime.   Yes [provider]  digoxin (LANOXIN) 0.125 MG tablet Take 1 tablet (0.125 mg total) by mouth daily. 11/20/20  Yes Bensimhon, Shaune Pascal, MD  hydrALAZINE (APRESOLINE) 100 MG tablet Take 1 tablet (100 mg total) by mouth 3 (three) times daily. 06/28/20  Yes Bensimhon, Shaune Pascal, MD  isosorbide mononitrate (IMDUR) 60 MG 24 hr tablet Take 1 tablet (60 mg total) by mouth daily. 03/22/20  Yes Bensimhon, Shaune Pascal, MD  JARDIANCE 10 MG TABS tablet Take 10 mg by mouth daily. 12/18/21  Yes [provider]  metFORMIN (GLUCOPHAGE) 500 MG tablet Take 500 mg by mouth daily. 09/13/21  Yes [provider]  naproxen sodium (ALEVE) 220 MG tablet Take 220 mg by mouth as needed (pain, headache).   Yes [provider]  potassium chloride SA (KLOR-CON) 20 MEQ tablet Take 2 tablets (40 mEq total) by mouth 2 (two) times daily. 10/08/21  Yes Milford, Maricela Bo, FNP  spironolactone (ALDACTONE) 25 MG tablet Take 1 tablet (25 mg total) by mouth daily. 10/08/21  Yes Milford, Maricela Bo, FNP  torsemide (DEMADEX) 20 MG tablet Take 4 tablets (80 mg total) by mouth 2 (two) times daily. 10/04/21  Yes Bensimhon, Shaune Pascal, MD  Vitamin D, Ergocalciferol, (DRISDOL) 1.25 MG (50000 UNIT) CAPS capsule Take 50,000 Units by mouth every Monday. 08/14/20  Yes [provider]  metolazone (ZAROXOLYN) 2.5 MG tablet Take 1 tablet (2.5 mg total) by mouth as directed. By heart failure clinic Patient not taking: Reported on 02/05/2022 10/08/21   Rafael Bihari, FNP  sacubitril-valsartan (ENTRESTO) 97-103 MG Take 1 tablet by mouth 2 (two) times daily. Patient not taking: Reported on 01/28/2022 02/17/20   Bensimhon, Shaune Pascal, MD    Current Medications Scheduled Meds:  (feeding supplement) PROSource Plus  30 mL Oral BID BM   sodium chloride   Intravenous Once   sodium chloride   Intravenous Once   acetaminophen  1,000 mg Oral Q6H   Or   acetaminophen (TYLENOL)  oral liquid 160 mg/5 mL  1,000 mg Per Tube Q6H   allopurinol  300 mg Per Tube Daily   arformoterol  15 mcg Nebulization BID   aspirin EC  325 mg Oral Daily   Or   aspirin  324 mg Per Tube Daily   Or   aspirin  300 mg Rectal Daily   atorvastatin  80 mg Per Tube Daily   bisacodyl  10 mg Oral Daily   Or   bisacodyl  10 mg Rectal Daily   chlorhexidine gluconate (MEDLINE KIT)  15 mL Mouth Rinse BID   Chlorhexidine Gluconate Cloth  6 each Topical Daily   docusate  100 mg Per Tube BID   mouth rinse  15 mL Mouth Rinse 10 times per day   pantoprazole sodium  40 mg Per Tube Daily   polyethylene glycol  17 g Per Tube Daily   revefenacin  175 mcg Nebulization Daily   sodium chloride flush  3 mL Intravenous Q12H   Continuous Infusions:  sodium chloride 20 mL/hr at 02/24/22 0900   sodium chloride  sodium chloride     amiodarone 30 mg/hr (02/24/22 1024)   ceFEPime (MAXIPIME) IV     dexmedetomidine (PRECEDEX) IV infusion Stopped (02/23/22 1745)   epinephrine 8 mcg/min (02/24/22 0900)   fentaNYL infusion INTRAVENOUS 300 mcg/hr (02/24/22 0900)   furosemide (LASIX) 200 mg in dextrose 5% 100 mL (48m/mL) infusion 12 mg/hr (02/24/22 0900)   heparin     insulin 2.8 Units/hr (02/24/22 0900)   lactated ringers     lactated ringers 20 mL/hr at 02/24/22 0900   methylene blue IVPB 100 mg (02/24/22 1017)   midazolam 7 mg/hr (02/24/22 0900)   milrinone 0.125 mcg/kg/min (02/24/22 0900)   norepinephrine (LEVOPHED) Adult infusion 65 mcg/min (02/24/22 0900)   vasopressin 0.04 Units/min (02/24/22 0906)   PRN Meds:.sodium chloride, dextrose, fentaNYL, heparin, midazolam, morphine injection, ondansetron (ZOFRAN) IV, oxyCODONE, sodium chloride flush, traMADol  CBC Recent Labs  Lab 02/23/22 0300 02/23/22 0645 02/23/22 1657 02/23/22 1804 02/23/22 2127 02/23/22 2145 02/24/22 0039 02/24/22 0412 02/24/22 0418  WBC 27.0*  --  33.9*  --  35.9*  --   --  38.5*  --   NEUTROABS 23.0*  --   --   --   --    --   --  32.0*  --   HGB 8.7*   < > 7.6*   < > 8.6*   < > 8.2* 7.9* 8.5*  HCT 26.6*   < > 23.8*   < > 25.7*   < > 24.0* 24.1* 25.0*  MCV 90.5  --  91.5  --  91.5  --   --  91.6  --   PLT 127*  --  141*  --  142*  --   --  158  --    < > = values in this interval not displayed.   Basic Metabolic Panel Recent Labs  Lab 03/10/2022 0415 03/13/2022 0840 03/19/2022 1623 02/23/2022 1836 03/15/2022 1841 02/27/2022 2352 02/23/22 0001 02/23/22 0300 02/23/22 0645 02/23/22 1014 02/23/22 1022 02/23/22 1657 02/23/22 1804 02/23/22 2127 02/23/22 2145 02/24/22 0039 02/24/22 0412 02/24/22 0418  NA 135   < > 139 135   < >  --    < > 133*   < > 132*   < > 133* 134* 130* 132* 132* 132* 132*  K 5.1   < > 4.2 4.8   < >  --    < > 5.7*   < > 5.8*   < > 5.4* 5.4* 5.2* 5.2* 5.3* 5.5* 5.4*  CL 96*   < > 101 104  --   --   --  101  --  102  --  101  --  98  --   --  99  --   CO2 32  --   --  26  --   --   --  24  --  24  --  24  --  23  --   --  24  --   GLUCOSE 133*   < > 139* 154*  --   --   --  132*  --  169*  --  134*  --  164*  --   --  114*  --   BUN 16   < > 14 13  --   --   --  14  --  15  --  16  --  18  --   --  21*  --   CREATININE 0.96   < >  0.80 0.96  --  1.10  --  1.07  --  1.14  --  1.33*  --  1.37*  --   --  1.68*  --   CALCIUM 9.5  --   --  8.4*  --   --   --  8.2*  --  8.0*  --  8.1*  --  8.0*  --   --  8.2*  --   PHOS  --   --   --   --   --   --   --  3.9  --   --   --   --   --   --   --   --  5.3*  --    < > = values in this interval not displayed.    Physical Exam  Blood pressure (!) 76/62, pulse 94, temperature 100 F (37.8 C), resp. rate (!) 28, height 5' 7"  (1.702 m), weight 117 kg, SpO2 96 %. GEN: Intubated, sedated, ill-appearing, globalized edema ENT: ETT in place EYES: Eyes closed CV: Continuous hum noted, S1 and S2 difficult to appreciate PULM: Coarse breath sounds bilaterally ABD: Soft SKIN: Numerous IVs and catheters noted; R femoral Temp HD cath noted EXT: Anasarca as  above  Assessment 67M AKI after LVAD/AVF surgery 3/3, persistent severe shock and AHRF, failing diuresis  AKI, likely ATN after LVAD/AVR 3/3 and persistent severe shock; inadequate response to diuretics Severe systolc CHF s/p LVAD 3/3 AI s/p AVF 3/3 Mixed shock on 3 pressors, inotrope VF/VT on amio Mild hyperkalemia OSA/CPAP Anemia  Plan Start CRRT, all 4K, 1000/1500/400 Pre/D/Post, no heparin, 0 to 50 mL net neg to start Add fixed dose heparin to CRRT if clotting is a problem Will follow along   Colin Walton  02/24/2022, 10:46 AM

## 2022-02-24 NOTE — Progress Notes (Signed)
ANTICOAGULATION CONSULT NOTE - Initial Consult ? ?Pharmacy Consult for heparin  ?Indication:  LVAD HM3 implanted 3/3 ? ?No Known Allergies ? ?Patient Measurements: ?Height: 5' 7"  (170.2 cm) ?Weight: 117 kg (257 lb 15 oz) ?IBW/kg (Calculated) : 66.1 ? ?Vital Signs: ?Temp: 100 ?F (37.8 ?C) (03/05 0930) ?Temp Source: Core (03/05 0800) ?BP: 76/62 (03/05 0816) ?Pulse Rate: 94 (03/05 0930) ? ?Labs: ?Recent Labs  ?  03/12/2022 ?1836 03/10/2022 ?1841 02/23/22 ?0300 02/23/22 ?0645 02/23/22 ?1657 02/23/22 ?1804 02/23/22 ?2127 02/23/22 ?2145 02/24/22 ?0051 02/24/22 ?1021 02/24/22 ?1173  ?HGB 11.7*   < > 8.7*   < > 7.6*   < > 8.6*   < > 8.2* 7.9* 8.5*  ?HCT 35.4*   < > 26.6*   < > 23.8*   < > 25.7*   < > 24.0* 24.1* 25.0*  ?PLT 135*  132*   < > 127*  --  141*  --  142*  --   --  158  --   ?APTT 47*  46*  --   --   --   --   --   --   --   --   --   --   ?LABPROT 10.2*  10.3*  --  14.2  --   --   --   --   --   --  15.8*  --   ?INR 0.7*  0.7*  --  1.1  --   --   --   --   --   --  1.3*  --   ?CREATININE 0.96   < > 1.07   < > 1.33*  --  1.37*  --   --  1.68*  --   ? < > = values in this interval not displayed.  ? ? ?Estimated Creatinine Clearance: 65.1 mL/min (A) (by C-G formula based on SCr of 1.68 mg/dL (H)). ? ? ?Medical History: ?Past Medical History:  ?Diagnosis Date  ? Chronic systolic CHF (congestive heart failure) ([place])   ? Hyperlipidemia   ? Hypertension   ? Mild mitral regurgitation 2016  ? NICM (nonischemic cardiomyopathy) ([place])   ? Obesity   ? Sleep apnea 12/24/12  ? Sleep study February 2014  ? ? ? ? ?Assessment: ?50yom admitted with heart failure s/p LVAD HM3 implanted 3/3.  Bleeding post op required novo7 - improved CT remain in place with minimal drainage, h/h  low stable PTC 150s stable LDH elevated post op 300-400 ? ?Goal of Therapy:  ?Heparin level < 0.3 for now immediately post op ?INR 2-2.5 ?Monitor platelets by anticoagulation protocol: Yes ?  ?Plan:  ?Start fixed dose heparin 500uts/hr  ?No up titration for  now ?Heparin level and CBC daily  ?Monitor s/s bleeding ? ? ?Bonnita Nasuti Pharm.D. CPP, BCPS ?Clinical Pharmacist ?[phone] ?02/24/2022 11:18 AM  ? ? ? ?

## 2022-02-24 NOTE — Progress Notes (Signed)
OT Cancellation Note ? ?Patient Details ?Name: Devontae Wierman ?MRN: [id number] ?DOB: 1971-05-07 ? ? ?Cancelled Treatment:    Reason Eval/Treat Not Completed: Patient not medically ready.  Pt still on vent.  Will reattempt. ? ?Jilliane Kazanjian C., OTR/L ?Acute Rehabilitation Services ?Pager [phone] ?Office [phone] ? ? ?Synda Bagent M ?02/24/2022, 8:25 AM ?

## 2022-02-24 NOTE — Plan of Care (Signed)
?  Problem: Clinical Measurements: ?Goal: Cardiovascular complication will be avoided ?Outcome: Not Progressing ?  ?Problem: Elimination: ?Goal: Will not experience complications related to urinary retention ?Outcome: Not Progressing ?Note: Extremely minimal UOP; CVVDHF started ?  ?Problem: Fluid Volume: ?Goal: Risk for excess fluid volume will decrease ?Outcome: Not Progressing ?Note: CVVDHF started today for attempting fluid removal ?  ?

## 2022-02-24 NOTE — Progress Notes (Addendum)
Patient ID: Colin Walton, male   DOB: 03/12/1971, 51 y.o.   MRN: [id number]     Advanced Heart Failure Rounding Note  PCP-Cardiologist: Lauree Chandler, MD   Subjective:    2/17 admitted for a/c Biventricular HF, NYHA IIIb-IV symptoms, w/ low output. Stated on milrinone. LVAD work up initiated.  2/23 RHC/LHC on milrinone 0.125 mcg + Norepi 2 mcg w/ 2v CAD, Severe NICM EF < 20%, Elevated filling pressures with markedly reduced CO with severe AI 2/28 diuretics held for AKI  3/1 Multiple teeth extractions  3/3 HM-3 VAD placed with bioprosthetic AVR and SVG (off outflow graft) to RCA. Complicated OR case with multiple episodes VT/VF. Persistently coagulopathic.  MAP upper 60s this morning on NE 65, epinephrine 8, milrinone 0.25, vasopressin 0.04.  Weight up 4 lbs.  I/Os positive, only made 1645 cc UOP in 24 hrs, UOP has been dropping off overnight (15 cc last hour).  Creatinine up to 1.68.  He remains on NO 30 ppm.   WBCs 27 => 38.5, Tm 100.4.   On vent, FiO2 0.8.   Post-op had both AF and VT, now on amiodarone gtt 60.   [place]: CVP 19 PA 70/31 SVR 537 PAPI 2.05 CI 3.5 Co-ox 71%  LVAD Interrogation HM 3: Speed: 5600 Flow: 4.7 PI: 4.2 Power: 4.4. Multiple PI events during day yesterday, none overnight.     Objective:   Weight Range: 117 kg Body mass index is 40.4 kg/m.   Vital Signs:   Temp:  [99.7 F (37.6 C)-100.8 F (38.2 C)] 100 F (37.8 C) (03/05 0816) Pulse Rate:  [90-189] 94 (03/05 0816) Resp:  [0-30] 28 (03/05 0816) BP: (51-106)/(12-85) 76/62 (03/05 0816) SpO2:  [84 %-100 %] 94 % (03/05 0816) Arterial Line BP: (59-88)/(51-75) 78/64 (03/05 0815) FiO2 (%):  [70 %-100 %] 80 % (03/05 0816) Weight:  [850 kg] 117 kg (03/05 0600) Last BM Date : 03/16/2022  Weight change: Filed Weights   02/24/2022 0433 02/23/22 0645 02/24/22 0600  Weight: 105.8 kg 115.2 kg 117 kg    Intake/Output:   Intake/Output Summary (Last 24 hours) at 02/24/2022 0826 Last data filed at  02/24/2022 0800 Gross per 24 hour  Intake 6569.86 ml  Output 3595 ml  Net 2974.86 ml    Physical Exam   General: Well appearing this am. NAD.  HEENT: Normal. Neck: Supple, JVP 16+ cm. Carotids OK.  Cardiac:  Mechanical heart sounds with LVAD hum present.  Lungs:  CTAB, normal effort.  Abdomen:  NT, ND, no HSM. No bruits or masses. +BS  LVAD exit site: Well-healed and incorporated. Dressing dry and intact. No erythema or drainage. Stabilization device present and accurately applied. Driveline dressing changed daily per sterile technique. Extremities:  Warm and dry. No cyanosis, clubbing, rash. 1+ ankle edema.  Neuro:  Sedated on vent.    Telemetry   NSR vs accelerated junctional in 90s (personally reviewed)   Labs    CBC Recent Labs    02/23/22 0300 02/23/22 0645 02/23/22 2127 02/23/22 2145 02/24/22 0412 02/24/22 0418  WBC 27.0*   < > 35.9*  --  38.5*  --   NEUTROABS 23.0*  --   --   --  32.0*  --   HGB 8.7*   < > 8.6*   < > 7.9* 8.5*  HCT 26.6*   < > 25.7*   < > 24.1* 25.0*  MCV 90.5   < > 91.5  --  91.6  --   PLT 127*   < >  142*  --  158  --    < > = values in this interval not displayed.   Basic Metabolic Panel Recent Labs    02/23/22 0300 02/23/22 0645 02/23/22 1657 02/23/22 1804 02/23/22 2127 02/23/22 2145 02/24/22 0412 02/24/22 0418  NA 133*   < > 133*   < > 130*   < > 132* 132*  K 5.7*   < > 5.4*   < > 5.2*   < > 5.5* 5.4*  CL 101   < > 101  --  98  --  99  --   CO2 24   < > 24  --  23  --  24  --   GLUCOSE 132*   < > 134*  --  164*  --  114*  --   BUN 14   < > 16  --  18  --  21*  --   CREATININE 1.07   < > 1.33*  --  1.37*  --  1.68*  --   CALCIUM 8.2*   < > 8.1*  --  8.0*  --  8.2*  --   MG 2.3  --  2.0  --   --   --  2.0  --   PHOS 3.9  --   --   --   --   --  5.3*  --    < > = values in this interval not displayed.   Liver Function Tests Recent Labs    02/23/22 0300 02/24/22 0412  AST 148* 124*  ALT 30 25  ALKPHOS 52 56  BILITOT 3.8*  4.4*  PROT 5.0* 5.1*  ALBUMIN 3.4* 2.7*     No results for input(s): LIPASE, AMYLASE in the last 72 hours. Cardiac Enzymes No results for input(s): CKTOTAL, CKMB, CKMBINDEX, TROPONINI in the last 72 hours.  BNP: BNP (last 3 results) Recent Labs    02/02/2022 1649 02/11/22 0345 02/23/22 0500  BNP 1,303.2* 166.8* 616.6*    ProBNP (last 3 results) No results for input(s): PROBNP in the last 8760 hours.   D-Dimer Recent Labs    03/10/2022 1836  DDIMER 1.04*   Hemoglobin A1C No results for input(s): HGBA1C in the last 72 hours.   Fasting Lipid Panel No results for input(s): CHOL, HDL, LDLCALC, TRIG, CHOLHDL, LDLDIRECT in the last 72 hours.  Thyroid Function Tests No results for input(s): TSH, T4TOTAL, T3FREE, THYROIDAB in the last 72 hours.  Invalid input(s): FREET3   Other results:   Imaging    No results found.   Medications:     Scheduled Medications:  (feeding supplement) PROSource Plus  30 mL Oral BID BM   sodium chloride   Intravenous Once   sodium chloride   Intravenous Once   acetaminophen  1,000 mg Oral Q6H   Or   acetaminophen (TYLENOL) oral liquid 160 mg/5 mL  1,000 mg Per Tube Q6H   allopurinol  300 mg Per Tube Daily   arformoterol  15 mcg Nebulization BID   aspirin EC  325 mg Oral Daily   Or   aspirin  324 mg Per Tube Daily   Or   aspirin  300 mg Rectal Daily   atorvastatin  80 mg Per Tube Daily   bisacodyl  10 mg Oral Daily   Or   bisacodyl  10 mg Rectal Daily   chlorhexidine  15 mL Mouth Rinse BID   chlorhexidine gluconate (MEDLINE KIT)  15 mL Mouth Rinse BID  Chlorhexidine Gluconate Cloth  6 each Topical Daily   docusate  100 mg Per Tube BID   mouth rinse  15 mL Mouth Rinse 10 times per day   pantoprazole sodium  40 mg Per Tube Daily   polyethylene glycol  17 g Per Tube Daily   revefenacin  175 mcg Nebulization Daily   sodium chloride flush  3 mL Intravenous Q12H   sodium zirconium cyclosilicate  5 g Oral Once     Infusions:  sodium chloride 20 mL/hr at 02/24/22 0800   sodium chloride     sodium chloride     amiodarone 60 mg/hr (02/24/22 0800)    ceFAZolin (ANCEF) IV Stopped (02/24/22 0535)   dexmedetomidine (PRECEDEX) IV infusion Stopped (02/23/22 1745)   epinephrine 8 mcg/min (02/24/22 0800)   fentaNYL infusion INTRAVENOUS 300 mcg/hr (02/24/22 0800)   furosemide (LASIX) 200 mg in dextrose 5% 100 mL (8m/mL) infusion 12 mg/hr (02/24/22 0800)   insulin 1.8 Units/hr (02/24/22 0800)   lactated ringers     lactated ringers 20 mL/hr at 02/24/22 0800   midazolam 7 mg/hr (02/24/22 0800)   milrinone     norepinephrine (LEVOPHED) Adult infusion 65 mcg/min (02/24/22 0800)   vasopressin 0.04 Units/min (02/24/22 0800)    PRN Medications: sodium chloride, dextrose, fentaNYL, midazolam, morphine injection, ondansetron (ZOFRAN) IV, oxyCODONE, sodium chloride flush, traMADol   Assessment/Plan   1. Acute on Chronic Biventricular Heart Failure-> cardiogenic shock - cMRI 8/21 EF 24% Moderate AI. No LGE or infiltrative process. - Echo 02/07/2022 EF < 20% with mild LV dilation, RV severely HK with moderate dilation and D-shaped septum, AI moderate to severe.  - Suspect CM related to HTN +/- ETOH. AI may be contributing.  - R/LHC 02/07/2022 3v CAD/ RHC with elevated filling pressures.  - Not transplant candidate due to smoking and lung disease - 3/3 HM-3 VAD placed with AVR and SVG (off outflow graft) to RCA - He remains on epinephrine 8, NE 65, milrinone 0.25, vasopressin 0.04, NO 30 ppm. Cardiac output good this morning but MAP is marginal, upper 60s to 70.  Vasoplegia with low SVR.  Decrease milrinone to 0.125.  Discussed with Drs BCyndia Bentand STamala Julian plan to add methylene blue this morning.  With low SVR, low grade fever and rising WBCs, will also cover with broad spectrum abx.  - AKI with poor response so far to Lasix gtt. CVP up to 19, weight rising.  Increase Lasix gtt to 12 mg/hr this morning.  If no response  (worried there will not be with high pressor doses), will need CVVH.  Will need HD catheter placement and will consult nephrology this morning.   2. HM-3 VAD - VAD interrogated personally. Parameters stable. - Start low dose heparin gtt after HD catheter today.   3. Acute hypoxic/post-op respiratory failure - Pre-op PFTs with severe obstruction and restriction, emphysema on CT chest.  Significant underlying COPD - Remains on FiO2 0.8, suspect very slow vent wean with volume overload and underlying lung disease, may need trach.  CCM managing vent. - Will try to increase diuresis today, if no response will need CVVH for volume removal.   4. Acute coagulopathy - given multiple units of product in OR + 1/2 dose Factor 7 - follow closely - transfuse hgb < 8. Hgb 7.9 today, will hold off on extra volume for now but will need transfusion with further drop.   5. Moderate-severe aortic regurgitation - Moderate to severe AI on echo 02/05/2022.  Suspect AI is  not cause of cardiomyopathy but is likely making symptoms worse.  - Bioprosthetic AVR with LVAD.   6. CAD - cath 3v CAD LAD 40% D1 90%, OM 90% mRCA 80% - SVG from outflow graft to RCA at time of VAD  7. VT/VF - continue IV amio gtt, can decrease to 30 mg/hr.  - Keep K >4.0 Mg > 2.0    8. OSA   9. Atrial fibrillation - Noted post-op.  On amiodarone gtt now and ?NSR vs accelerated junctional rhythm.   10. ID - post-op fever Tm 100.4, WBCs 27 => 38.5.  May be post-op inflammatory state but with very low SVR/shock, will cover with broad spectrum abx.  Has had vancomycin as surgical prophylaxis, will add cefepime.    11. AKI - Creatinine up to 1.68 with poor UOP.  - Increase Lasix gtt to 12 mg/hr, with high pressor requirement/low SVR suspect response will not be vigorous.  Consult renal and place HD catheter for CVVH.   CRITICAL CARE Performed by: Loralie Champagne  Total critical care time: 45 minutes  Critical care time was exclusive of  separately billable procedures and treating other patients.  Critical care was necessary to treat or prevent imminent or life-threatening deterioration.  Critical care was time spent personally by me (independent of midlevel providers or residents) on the following activities: development of treatment plan with patient and/or surrogate as well as nursing, discussions with consultants, evaluation of patient's response to treatment, examination of patient, obtaining history from patient or surrogate, ordering and performing treatments and interventions, ordering and review of laboratory studies, ordering and review of radiographic studies, pulse oximetry and re-evaluation of patient's condition.  Length of Stay: [place], MD  02/24/2022, 8:26 AM  Advanced Heart Failure Team Pager [phone] (M-F; 7a - 5p)  Please contact [facility] for night-coverage after hours (5p -7a ) and weekends on [email or web address]   .

## 2022-02-24 NOTE — Progress Notes (Signed)
Patient ID: Colin Walton, male   DOB: Mar 02, 1971, 51 y.o.   MRN: [id number] HeartMate 3 Rounding Note  Subjective:    Remains on high dose vasopressors to maintain MAP upper 60's.  Milrinone 0.25, epi 8, NE 65, vaso 0.04.  CVP 19 PA 70/31 SVR 537 PAPI 2.05 CI 3.5 Co-ox 71%  Remains heavily sedated to maintain synchrony with vent. 70% FiO2 7.22, PCO2 61, PO2 85, -3, 93%  +2883 cc/24 hrs despite lasix drip at 4. Oliguric now to 15 cc last hr and creat up to 1.68.    LVAD INTERROGATION:  HeartMate IIl LVAD:  Flow 4.7 liters/min, speed 5600, power 4.4, PI 4.2.  no PI events overnight.  Objective:    Vital Signs:   Temp:  [99.7 F (37.6 C)-100.8 F (38.2 C)] 100.2 F (37.9 C) (03/05 0830) Pulse Rate:  [90-189] 95 (03/05 0830) Resp:  [0-30] 28 (03/05 0830) BP: (51-106)/(12-85) 76/62 (03/05 0816) SpO2:  [84 %-100 %] 96 % (03/05 0830) Arterial Line BP: (59-88)/(51-75) 78/64 (03/05 0830) FiO2 (%):  [70 %-100 %] 80 % (03/05 0816) Weight:  [376 kg] 117 kg (03/05 0600) Last BM Date : 02/25/2022 Mean arterial Pressure 65  Intake/Output:   Intake/Output Summary (Last 24 hours) at 02/24/2022 0858 Last data filed at 02/24/2022 0800 Gross per 24 hour  Intake 6569.86 ml  Output 3595 ml  Net 2974.86 ml     Physical Exam: General:  anasarca. Intubated and sedated HEENT: no bleeding from mouth. Cor: distant heart sounds with LVAD hum present. Lungs: rhonchi bilat Abdomen: soft, protuberant but at baseline. Hypoactive bowel sounds. Extremities: warm, edematous Neuro: sedated on vent. Reportedly followed commands yesterday when he woke up  Telemetry: sinus vs accelerated junction with retrograde p-waves.  Labs: Basic Metabolic Panel: Recent Labs  Lab 03/07/2022 1836 02/24/2022 1841 03/05/2022 2352 02/23/22 0001 02/23/22 0300 02/23/22 0645 02/23/22 1014 02/23/22 1022 02/23/22 1657 02/23/22 1804 02/23/22 2127 02/23/22 2145 02/24/22 0039 02/24/22 0412 02/24/22 0418  NA 135    < >  --    < > 133*   < > 132*   < > 133*   < > 130* 132* 132* 132* 132*  K 4.8   < >  --    < > 5.7*   < > 5.8*   < > 5.4*   < > 5.2* 5.2* 5.3* 5.5* 5.4*  CL 104  --   --   --  101  --  102  --  101  --  98  --   --  99  --   CO2 26  --   --   --  24  --  24  --  24  --  23  --   --  24  --   GLUCOSE 154*  --   --   --  132*  --  169*  --  134*  --  164*  --   --  114*  --   BUN 13  --   --   --  14  --  15  --  16  --  18  --   --  21*  --   CREATININE 0.96  --  1.10  --  1.07  --  1.14  --  1.33*  --  1.37*  --   --  1.68*  --   CALCIUM 8.4*  --   --   --  8.2*  --  8.0*  --  8.1*  --  8.0*  --   --  8.2*  --   MG 2.0  --  2.6*  --  2.3  --   --   --  2.0  --   --   --   --  2.0  --   PHOS  --   --   --   --  3.9  --   --   --   --   --   --   --   --  5.3*  --    < > = values in this interval not displayed.    Liver Function Tests: Recent Labs  Lab 02/23/22 0300 02/24/22 0412  AST 148* 124*  ALT 30 25  ALKPHOS 52 56  BILITOT 3.8* 4.4*  PROT 5.0* 5.1*  ALBUMIN 3.4* 2.7*   No results for input(s): LIPASE, AMYLASE in the last 168 hours. No results for input(s): AMMONIA in the last 168 hours.  CBC: Recent Labs  Lab 03/04/2022 2352 02/23/22 0001 02/23/22 0300 02/23/22 0645 02/23/22 1657 02/23/22 1804 02/23/22 2127 02/23/22 2145 02/24/22 0039 02/24/22 0412 02/24/22 0418  WBC 21.9*  --  27.0*  --  33.9*  --  35.9*  --   --  38.5*  --   NEUTROABS  --   --  23.0*  --   --   --   --   --   --  32.0*  --   HGB 9.0*   < > 8.7*   < > 7.6*   < > 8.6* 11.6* 8.2* 7.9* 8.5*  HCT 27.2*   < > 26.6*   < > 23.8*   < > 25.7* 34.0* 24.0* 24.1* 25.0*  MCV 91.0  --  90.5  --  91.5  --  91.5  --   --  91.6  --   PLT 123*  --  127*  --  141*  --  142*  --   --  158  --    < > = values in this interval not displayed.    INR: Recent Labs  Lab 03/10/2022 1836 02/23/22 0300 02/24/22 0412  INR 0.7*   0.7* 1.1 1.3*    Other results: EKG:   Imaging: DG CHEST PORT 1 VIEW  Result  Date: 02/24/2022 CLINICAL DATA:  LVAD.  CHF.  Hypertension. EXAM: PORTABLE CHEST 1 VIEW COMPARISON:  02/23/2022 FINDINGS: LVAD is again noted projecting over the left lower chest. ET tube tip is above the carina. The nasogastric tube courses below the diaphragm. Swan-Ganz catheter tip projects over the right main pulmonary artery. Status post median sternotomy with aortic valve replacement there are bilateral chest tubes and mediastinal drain in place. No pneumothorax visualized. Mild interstitial edema is stable from previous exam. IMPRESSION: 1. Stable support apparatus. 2. No change in mild interstitial edema. 3. No pneumothorax. Electronically Signed   By: Kerby Moors M.D.   On: 02/24/2022 08:45   DG Chest Port 1 View  Result Date: 02/23/2022 CLINICAL DATA:  Status post CABG EXAM: PORTABLE CHEST 1 VIEW COMPARISON:  03/02/2022 FINDINGS: Endotracheal tube with the tip 4 cm above the carina. Nasogastric tube coursing below the diaphragm. Swan-Ganz catheter with the tip projecting over the right main pulmonary artery. Bilateral chest tubes in unchanged position. No pneumothorax. Bilateral mild interstitial thickening. Bilateral small pleural effusions. Bibasilar atelectasis. Stable cardiomegaly. Aortic valve replacement and median sternotomy. No acute osseous abnormality. IMPRESSION: 1. Support lines and tubing in satisfactory position. 2.  Mild pulmonary vascular congestion. Bilateral small pleural effusions with bibasilar atelectasis. No pneumothorax. 3. Stable cardiomegaly. Electronically Signed   By: Kathreen Devoid M.D.   On: 02/23/2022 09:01   DG Chest Port 1 View  Result Date: 03/09/2022 CLINICAL DATA:  Postop. Status post CABG aortic valve replacement. Left ventricular assist device present. EXAM: PORTABLE CHEST 1 VIEW COMPARISON:  Preoperative radiograph 02/13/2022 FINDINGS: Patient is post median sternotomy with prosthetic cardiac valve. Endotracheal tube tip is approximately 6.6 cm from the carina  just below the clavicular heads. Enteric tube is in place with tip and side-port below the diaphragm. Left ventricular assist device in place. There are bilateral chest tubes. Right internal jugular Swan-Ganz catheter tip overlies the main pulmonary outflow tract. Right upper lobe collapse. Ill-defined retrocardiac opacity likely atelectasis and pleural fluid. Cardiomegaly is similar to preoperative exam. No visualized pneumothorax. IMPRESSION: 1. Post median sternotomy with prosthetic cardiac valve. Left ventricular device in place. Support apparatus as described. 2. Right upper lobe collapse. 3. Retrocardiac opacity likely atelectasis and pleural fluid. 4. Bilateral chest tubes in place. No visualized pneumothorax. Electronically Signed   By: Keith Rake M.D.   On: 03/19/2022 18:54     Medications:     Scheduled Medications:  (feeding supplement) PROSource Plus  30 mL Oral BID BM   sodium chloride   Intravenous Once   sodium chloride   Intravenous Once   acetaminophen  1,000 mg Oral Q6H   Or   acetaminophen (TYLENOL) oral liquid 160 mg/5 mL  1,000 mg Per Tube Q6H   allopurinol  300 mg Per Tube Daily   arformoterol  15 mcg Nebulization BID   aspirin EC  325 mg Oral Daily   Or   aspirin  324 mg Per Tube Daily   Or   aspirin  300 mg Rectal Daily   atorvastatin  80 mg Per Tube Daily   bisacodyl  10 mg Oral Daily   Or   bisacodyl  10 mg Rectal Daily   chlorhexidine  15 mL Mouth Rinse BID   chlorhexidine gluconate (MEDLINE KIT)  15 mL Mouth Rinse BID   Chlorhexidine Gluconate Cloth  6 each Topical Daily   docusate  100 mg Per Tube BID   mouth rinse  15 mL Mouth Rinse 10 times per day   pantoprazole sodium  40 mg Per Tube Daily   polyethylene glycol  17 g Per Tube Daily   revefenacin  175 mcg Nebulization Daily   sodium chloride flush  3 mL Intravenous Q12H   sodium zirconium cyclosilicate  5 g Oral Once    Infusions:  sodium chloride 20 mL/hr at 02/24/22 0800   sodium chloride      sodium chloride     amiodarone 60 mg/hr (02/24/22 0800)    ceFAZolin (ANCEF) IV Stopped (02/24/22 0535)   dexmedetomidine (PRECEDEX) IV infusion Stopped (02/23/22 1745)   epinephrine 8 mcg/min (02/24/22 0800)   fentaNYL infusion INTRAVENOUS 300 mcg/hr (02/24/22 0800)   furosemide (LASIX) 200 mg in dextrose 5% 100 mL (69m/mL) infusion 12 mg/hr (02/24/22 0800)   insulin 1.8 Units/hr (02/24/22 0800)   lactated ringers     lactated ringers 20 mL/hr at 02/24/22 0800   midazolam 7 mg/hr (02/24/22 0800)   milrinone 0.125 mcg/kg/min (02/24/22 0846)   norepinephrine (LEVOPHED) Adult infusion 65 mcg/min (02/24/22 0800)   vasopressin 0.04 Units/min (02/24/22 0800)    PRN Medications: sodium chloride, dextrose, fentaNYL, midazolam, morphine injection, ondansetron (ZOFRAN) IV, oxyCODONE, sodium chloride flush, traMADol  Assessment:   Acute on chronic biventricular heart failure with cardiogenic shock and LVEF< 20% 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   POD 2 s/p HM 3 LVAD, AVR with 25 mm pericardial valve and CABG x 1 to PDA with SVG.   Intraop coagulopathy requiring multiple units of FFP, cryo, plts still with bleeding from needle holes and raw surface with abnormal TEG after that and required half dose Factor Vll to achieve adequate hemostasis to leave the OR.    Preop PFT's with severe obstructive and restrictive lung disease and severe decrease in diffusion capacity with bullous disease of upper lobes on chest CT. Baseline PCO2 48 on preop ABG and in OR after intubation PCO2 55. Suspect he will be difficult to extubate and keep off vent. CXR postop showed RUL collapse which is improved this am but right lung not fully aerated.    Postop atrial fib and VT. On IV amio.   OSA on CPAP   Morbid obesity with BMI 40.  Plan/Discussion:    He is critically ill on multiple high dose vasopressors and marginal  MAP, marginal blood gas and oliguria with rising creatinine. Wt is 25 lbs over preop and 4 lbs up from yesterday. Lasix drip increased to 12 mg/hr this am and if no response he will need CRRT to remove volume or else we may not be able to ventilate and oxygenate him. CCM planning to insert HD catheter in case.  I think we can start heparin today after catheter is in.  Keep all chest tubes in .  Low grade fever and rising leukocytosis to 38.5. This early postop I suspect it is inflammatory but with low SVR requiring high dose vasopressors I agree with adding cefepime to vanc empirically.    I reviewed the LVAD parameters from today, and compared the results to the patient's prior recorded data.  No programming changes were made.  The LVAD is functioning within specified parameters. LVAD interrogation was negative for any significant power changes, alarms or PI events/speed drops.  LVAD equipment check completed and is in good working order.  Back-up equipment present.    Length of Stay: [address]  Fernande Boyden [facility] 02/24/2022, 8:58 AM

## 2022-02-24 NOTE — Procedures (Signed)
Central Venous Catheter Insertion Procedure Note ? ?Colin Walton  ?[id number]  ?October 04, 1971 ? ?Date:02/24/22  ?Time:10:14 AM  ? ?Provider Performing:Deepa Barthel C Katrinka Blazing  ? ?Procedure: Insertion of Non-tunneled Central Venous Catheter(36556) with US guidance (74163)  ? ?Indication(s) ?Hemodialysis ? ?Consent ?Risks of the procedure as well as the alternatives and risks of each were explained to the patient and/or caregiver.  Consent for the procedure was obtained and is signed in the bedside chart ? ?Anesthesia ?Topical only with 1% lidocaine  ? ?Timeout ?Verified patient identification, verified procedure, site/side was marked, verified correct patient position, special equipment/implants available, medications/allergies/relevant history reviewed, required imaging and test results available. ? ?Sterile Technique ?Maximal sterile technique including full sterile barrier drape, hand hygiene, sterile gown, sterile gloves, mask, hair covering, sterile ultrasound probe cover (if used). ? ?Procedure Description ?Area of catheter insertion was cleaned with chlorhexidine and draped in sterile fashion.  With real-time ultrasound guidance a HD catheter was placed into the right femoral vein. Nonpulsatile blood flow and easy flushing noted in all ports.  The catheter was sutured in place and sterile dressing applied. ? ?Complications/Tolerance ?None; patient tolerated the procedure well. ?Chest X-ray is ordered to verify placement for internal jugular or subclavian cannulation.   Chest x-ray is not ordered for femoral cannulation. ? ?EBL ?Minimal ? ?Specimen(s) ?None ? ?

## 2022-02-24 NOTE — Progress Notes (Signed)
RT NOTES: NO tank #2 changed to full tank, 2200 psi. Tank #1 has 1100 psi.  ?

## 2022-02-24 NOTE — Progress Notes (Signed)
Patient ID: Colin Walton, male   DOB: 10-30-1971, 51 y.o.   MRN: [id number] ?TCTS Evening Rounds: ? ?CRRT started today since no response to increased lasix. Keeping even at this point. ? ?Received methylene blue today for vasoplegic shock refractory to increasing NE and decreased milrinone to 0.125. Transfused 1 unit PRBC's and receiving some 25% albumin. MAP seems a little better since that at 68-72.  ? ?CI 3.6, CVP 19, PA tracing has a lot of whip but mean 34. ? ?ABG 7.26, PCO2 64, PO2 69, 91% ? ?BMET ?   ?Component Value Date/Time  ? NA 134 (L) 02/24/2022 1800  ? NA 143 11/25/2019 0913  ? K 5.3 (H) 02/24/2022 1800  ? CL 99 02/24/2022 1630  ? CO2 25 02/24/2022 1630  ? GLUCOSE 121 (H) 02/24/2022 1630  ? BUN 22 (H) 02/24/2022 1630  ? BUN 8 11/25/2019 0913  ? CREATININE 2.13 (H) 02/24/2022 1630  ? CREATININE 1.21 12/15/2015 1108  ? CALCIUM 7.7 (L) 02/24/2022 1630  ? GFRNONAA 37 (L) 02/24/2022 1630  ? GFRNONAA 86 06/11/2013 1144  ? ?Hgb 8.2. ?

## 2022-02-25 ENCOUNTER — Inpatient Hospital Stay (HOSPITAL_COMMUNITY): Payer: 59

## 2022-02-25 DIAGNOSIS — I5023 Acute on chronic systolic (congestive) heart failure: Secondary | ICD-10-CM | POA: Diagnosis not present

## 2022-02-25 LAB — CBC WITH DIFFERENTIAL/PLATELET
Abs Immature Granulocytes: 0.32 10*3/uL — ABNORMAL HIGH (ref 0.00–0.07)
Basophils Absolute: 0.1 10*3/uL (ref 0.0–0.1)
Basophils Relative: 0 %
Eosinophils Absolute: 0 10*3/uL (ref 0.0–0.5)
Eosinophils Relative: 0 %
HCT: 24.2 % — ABNORMAL LOW (ref 39.0–52.0)
Hemoglobin: 8.1 g/dL — ABNORMAL LOW (ref 13.0–17.0)
Immature Granulocytes: 1 %
Lymphocytes Relative: 3 %
Lymphs Abs: 0.7 10*3/uL (ref 0.7–4.0)
MCH: 30.7 pg (ref 26.0–34.0)
MCHC: 33.5 g/dL (ref 30.0–36.0)
MCV: 91.7 fL (ref 80.0–100.0)
Monocytes Absolute: 3 10*3/uL — ABNORMAL HIGH (ref 0.1–1.0)
Monocytes Relative: 12 %
Neutro Abs: 21.9 10*3/uL — ABNORMAL HIGH (ref 1.7–7.7)
Neutrophils Relative %: 84 %
Platelets: 142 10*3/uL — ABNORMAL LOW (ref 150–400)
RBC: 2.64 MIL/uL — ABNORMAL LOW (ref 4.22–5.81)
RDW: 15.2 % (ref 11.5–15.5)
WBC: 25.9 10*3/uL — ABNORMAL HIGH (ref 4.0–10.5)
nRBC: 0 % (ref 0.0–0.2)

## 2022-02-25 LAB — POCT I-STAT 7, (LYTES, BLD GAS, ICA,H+H)
Acid-base deficit: 1 mmol/L (ref 0.0–2.0)
Acid-base deficit: 1 mmol/L (ref 0.0–2.0)
Acid-base deficit: 6 mmol/L — ABNORMAL HIGH (ref 0.0–2.0)
Acid-base deficit: 4 mmol/L — ABNORMAL HIGH (ref 0.0–2.0)
Acid-base deficit: 6 mmol/L — ABNORMAL HIGH (ref 0.0–2.0)
Acid-base deficit: 1 mmol/L (ref 0.0–2.0)
Acid-base deficit: 10 mmol/L — ABNORMAL HIGH (ref 0.0–2.0)
Acid-base deficit: 3 mmol/L — ABNORMAL HIGH (ref 0.0–2.0)
Bicarbonate: 26.5 mmol/L (ref 20.0–28.0)
Bicarbonate: 26.4 mmol/L (ref 20.0–28.0)
Bicarbonate: 21.8 mmol/L (ref 20.0–28.0)
Bicarbonate: 23.5 mmol/L (ref 20.0–28.0)
Bicarbonate: 22 mmol/L (ref 20.0–28.0)
Bicarbonate: 26.6 mmol/L (ref 20.0–28.0)
Bicarbonate: 18 mmol/L — ABNORMAL LOW (ref 20.0–28.0)
Bicarbonate: 25.5 mmol/L (ref 20.0–28.0)
Calcium, Ion: 1.18 mmol/L (ref 1.15–1.40)
Calcium, Ion: 1.15 mmol/L (ref 1.15–1.40)
Calcium, Ion: 1.09 mmol/L — ABNORMAL LOW (ref 1.15–1.40)
Calcium, Ion: 1.08 mmol/L — ABNORMAL LOW (ref 1.15–1.40)
Calcium, Ion: 1.09 mmol/L — ABNORMAL LOW (ref 1.15–1.40)
Calcium, Ion: 1.07 mmol/L — ABNORMAL LOW (ref 1.15–1.40)
Calcium, Ion: 0.94 mmol/L — ABNORMAL LOW (ref 1.15–1.40)
Calcium, Ion: 1.04 mmol/L — ABNORMAL LOW (ref 1.15–1.40)
HCT: 24 % — ABNORMAL LOW (ref 39.0–52.0)
HCT: 24 % — ABNORMAL LOW (ref 39.0–52.0)
HCT: 23 % — ABNORMAL LOW (ref 39.0–52.0)
HCT: 22 % — ABNORMAL LOW (ref 39.0–52.0)
HCT: 23 % — ABNORMAL LOW (ref 39.0–52.0)
HCT: 21 % — ABNORMAL LOW (ref 39.0–52.0)
HCT: 18 % — ABNORMAL LOW (ref 39.0–52.0)
HCT: 24 % — ABNORMAL LOW (ref 39.0–52.0)
Hemoglobin: 8.2 g/dL — ABNORMAL LOW (ref 13.0–17.0)
Hemoglobin: 8.2 g/dL — ABNORMAL LOW (ref 13.0–17.0)
Hemoglobin: 7.8 g/dL — ABNORMAL LOW (ref 13.0–17.0)
Hemoglobin: 7.5 g/dL — ABNORMAL LOW (ref 13.0–17.0)
Hemoglobin: 7.8 g/dL — ABNORMAL LOW (ref 13.0–17.0)
Hemoglobin: 7.1 g/dL — ABNORMAL LOW (ref 13.0–17.0)
Hemoglobin: 6.1 g/dL — CL (ref 13.0–17.0)
Hemoglobin: 8.2 g/dL — ABNORMAL LOW (ref 13.0–17.0)
O2 Saturation: 90 %
O2 Saturation: 91 %
O2 Saturation: 93 %
O2 Saturation: 92 %
O2 Saturation: 90 %
O2 Saturation: 85 %
O2 Saturation: 90 %
O2 Saturation: 88 %
Patient temperature: 35.9
Patient temperature: 35.7
Patient temperature: 36
Patient temperature: 36.1
Patient temperature: 36.4
Patient temperature: 36.9
Patient temperature: 36.3
Patient temperature: 36
Potassium: 4.9 mmol/L (ref 3.5–5.1)
Potassium: 5 mmol/L (ref 3.5–5.1)
Potassium: 5.2 mmol/L — ABNORMAL HIGH (ref 3.5–5.1)
Potassium: 5.1 mmol/L (ref 3.5–5.1)
Potassium: 5 mmol/L (ref 3.5–5.1)
Potassium: 4.7 mmol/L (ref 3.5–5.1)
Potassium: 3.8 mmol/L (ref 3.5–5.1)
Potassium: 4.7 mmol/L (ref 3.5–5.1)
Sodium: 136 mmol/L (ref 135–145)
Sodium: 136 mmol/L (ref 135–145)
Sodium: 135 mmol/L (ref 135–145)
Sodium: 137 mmol/L (ref 135–145)
Sodium: 137 mmol/L (ref 135–145)
Sodium: 139 mmol/L (ref 135–145)
Sodium: 144 mmol/L (ref 135–145)
Sodium: 138 mmol/L (ref 135–145)
TCO2: 28 mmol/L (ref 22–32)
TCO2: 28 mmol/L (ref 22–32)
TCO2: 24 mmol/L (ref 22–32)
TCO2: 25 mmol/L (ref 22–32)
TCO2: 24 mmol/L (ref 22–32)
TCO2: 29 mmol/L (ref 22–32)
TCO2: 20 mmol/L — ABNORMAL LOW (ref 22–32)
TCO2: 27 mmol/L (ref 22–32)
pCO2 arterial: 57.4 mmHg — ABNORMAL HIGH (ref 32–48)
pCO2 arterial: 54.2 mmHg — ABNORMAL HIGH (ref 32–48)
pCO2 arterial: 55.8 mmHg — ABNORMAL HIGH (ref 32–48)
pCO2 arterial: 57.1 mmHg — ABNORMAL HIGH (ref 32–48)
pCO2 arterial: 58.4 mmHg — ABNORMAL HIGH (ref 32–48)
pCO2 arterial: 64.1 mmHg — ABNORMAL HIGH (ref 32–48)
pCO2 arterial: 50.3 mmHg — ABNORMAL HIGH (ref 32–48)
pCO2 arterial: 61.9 mmHg — ABNORMAL HIGH (ref 32–48)
pH, Arterial: 7.267 — ABNORMAL LOW (ref 7.35–7.45)
pH, Arterial: 7.288 — ABNORMAL LOW (ref 7.35–7.45)
pH, Arterial: 7.194 — CL (ref 7.35–7.45)
pH, Arterial: 7.219 — ABNORMAL LOW (ref 7.35–7.45)
pH, Arterial: 7.18 — CL (ref 7.35–7.45)
pH, Arterial: 7.225 — ABNORMAL LOW (ref 7.35–7.45)
pH, Arterial: 7.158 — CL (ref 7.35–7.45)
pH, Arterial: 7.217 — ABNORMAL LOW (ref 7.35–7.45)
pO2, Arterial: 63 mmHg — ABNORMAL LOW (ref 83–108)
pO2, Arterial: 66 mmHg — ABNORMAL LOW (ref 83–108)
pO2, Arterial: 78 mmHg — ABNORMAL LOW (ref 83–108)
pO2, Arterial: 73 mmHg — ABNORMAL LOW (ref 83–108)
pO2, Arterial: 71 mmHg — ABNORMAL LOW (ref 83–108)
pO2, Arterial: 60 mmHg — ABNORMAL LOW (ref 83–108)
pO2, Arterial: 73 mmHg — ABNORMAL LOW (ref 83–108)
pO2, Arterial: 64 mmHg — ABNORMAL LOW (ref 83–108)

## 2022-02-25 LAB — BPAM RBC
Blood Product Expiration Date: 202303102359
Blood Product Expiration Date: 202303102359
Blood Product Expiration Date: 202303132359
Blood Product Expiration Date: 202304012359
ISSUE DATE / TIME: 202303031428
ISSUE DATE / TIME: 202303031428
ISSUE DATE / TIME: 202303041847
ISSUE DATE / TIME: 202303051426
Unit Type and Rh: 6200
Unit Type and Rh: 6200
Unit Type and Rh: 6200
Unit Type and Rh: 6200

## 2022-02-25 LAB — RENAL FUNCTION PANEL
Albumin: 2.7 g/dL — ABNORMAL LOW (ref 3.5–5.0)
Anion gap: 14 (ref 5–15)
BUN: 14 mg/dL (ref 6–20)
CO2: 20 mmol/L — ABNORMAL LOW (ref 22–32)
Calcium: 7.6 mg/dL — ABNORMAL LOW (ref 8.9–10.3)
Chloride: 102 mmol/L (ref 98–111)
Creatinine, Ser: 1.75 mg/dL — ABNORMAL HIGH (ref 0.61–1.24)
GFR, Estimated: 47 mL/min — ABNORMAL LOW (ref >60)
Glucose, Bld: 62 mg/dL — ABNORMAL LOW (ref 70–99)
Phosphorus: 4.9 mg/dL — ABNORMAL HIGH (ref 2.5–4.6)
Potassium: 4.9 mmol/L (ref 3.5–5.1)
Sodium: 136 mmol/L (ref 135–145)

## 2022-02-25 LAB — COMPREHENSIVE METABOLIC PANEL
ALT: 17 U/L (ref 0–44)
AST: 109 U/L — ABNORMAL HIGH (ref 15–41)
Albumin: 2.9 g/dL — ABNORMAL LOW (ref 3.5–5.0)
Alkaline Phosphatase: 59 U/L (ref 38–126)
Anion gap: 9 (ref 5–15)
BUN: 16 mg/dL (ref 6–20)
CO2: 24 mmol/L (ref 22–32)
Calcium: 7.9 mg/dL — ABNORMAL LOW (ref 8.9–10.3)
Chloride: 103 mmol/L (ref 98–111)
Creatinine, Ser: 1.62 mg/dL — ABNORMAL HIGH (ref 0.61–1.24)
GFR, Estimated: 52 mL/min — ABNORMAL LOW (ref >60)
Glucose, Bld: 102 mg/dL — ABNORMAL HIGH (ref 70–99)
Potassium: 5 mmol/L (ref 3.5–5.1)
Sodium: 136 mmol/L (ref 135–145)
Total Bilirubin: 7.2 mg/dL — ABNORMAL HIGH (ref 0.3–1.2)
Total Protein: 5.7 g/dL — ABNORMAL LOW (ref 6.5–8.1)

## 2022-02-25 LAB — TYPE AND SCREEN
ABO/RH(D): A POS
Antibody Screen: NEGATIVE
Unit division: 0
Unit division: 0
Unit division: 0
Unit division: 0

## 2022-02-25 LAB — SURGICAL PATHOLOGY

## 2022-02-25 LAB — GLUCOSE, CAPILLARY
Glucose-Capillary: 108 mg/dL — ABNORMAL HIGH (ref 70–99)
Glucose-Capillary: 119 mg/dL — ABNORMAL HIGH (ref 70–99)
Glucose-Capillary: 104 mg/dL — ABNORMAL HIGH (ref 70–99)
Glucose-Capillary: 108 mg/dL — ABNORMAL HIGH (ref 70–99)
Glucose-Capillary: 101 mg/dL — ABNORMAL HIGH (ref 70–99)
Glucose-Capillary: 76 mg/dL (ref 70–99)
Glucose-Capillary: 59 mg/dL — ABNORMAL LOW (ref 70–99)
Glucose-Capillary: 73 mg/dL (ref 70–99)
Glucose-Capillary: 140 mg/dL — ABNORMAL HIGH (ref 70–99)
Glucose-Capillary: 91 mg/dL (ref 70–99)
Glucose-Capillary: 147 mg/dL — ABNORMAL HIGH (ref 70–99)
Glucose-Capillary: 63 mg/dL — ABNORMAL LOW (ref 70–99)
Glucose-Capillary: 98 mg/dL (ref 70–99)
Glucose-Capillary: 72 mg/dL (ref 70–99)
Glucose-Capillary: 59 mg/dL — ABNORMAL LOW (ref 70–99)
Glucose-Capillary: 104 mg/dL — ABNORMAL HIGH (ref 70–99)
Glucose-Capillary: 112 mg/dL — ABNORMAL HIGH (ref 70–99)
Glucose-Capillary: 50 mg/dL — ABNORMAL LOW (ref 70–99)
Glucose-Capillary: 105 mg/dL — ABNORMAL HIGH (ref 70–99)
Glucose-Capillary: 74 mg/dL (ref 70–99)

## 2022-02-25 LAB — CBC
HCT: 25.1 % — ABNORMAL LOW (ref 39.0–52.0)
Hemoglobin: 8.1 g/dL — ABNORMAL LOW (ref 13.0–17.0)
MCH: 30.8 pg (ref 26.0–34.0)
MCHC: 32.3 g/dL (ref 30.0–36.0)
MCV: 95.4 fL (ref 80.0–100.0)
Platelets: 121 10*3/uL — ABNORMAL LOW (ref 150–400)
RBC: 2.63 MIL/uL — ABNORMAL LOW (ref 4.22–5.81)
RDW: 15.5 % (ref 11.5–15.5)
WBC: 24.2 10*3/uL — ABNORMAL HIGH (ref 4.0–10.5)
nRBC: 1.2 % — ABNORMAL HIGH (ref 0.0–0.2)

## 2022-02-25 LAB — PROTIME-INR
INR: 1.2 (ref 0.8–1.2)
Prothrombin Time: 15.4 seconds — ABNORMAL HIGH (ref 11.4–15.2)

## 2022-02-25 LAB — PREPARE RBC (CROSSMATCH)

## 2022-02-25 LAB — PHOSPHORUS: Phosphorus: 4.3 mg/dL (ref 2.5–4.6)

## 2022-02-25 LAB — HEPARIN LEVEL (UNFRACTIONATED): Heparin Unfractionated: <0.1 IU/mL — ABNORMAL LOW (ref 0.30–0.70)

## 2022-02-25 LAB — LACTATE DEHYDROGENASE: LDH: 418 U/L — ABNORMAL HIGH (ref 98–192)

## 2022-02-25 LAB — MAGNESIUM: Magnesium: 2.4 mg/dL (ref 1.7–2.4)

## 2022-02-25 MED ORDER — SENNOSIDES 8.8 MG/5ML PO SYRP
10.0000 mL | ORAL_SOLUTION | Freq: Every day | ORAL | Status: DC
  Administered 2022-02-25: 10 mL

## 2022-02-25 MED ORDER — SODIUM BICARBONATE 8.4 % IV SOLN
100.0000 meq | Freq: Once | INTRAVENOUS | Status: AC
  Administered 2022-02-25: 100 meq via INTRAVENOUS
  Filled 2022-02-25: qty 100

## 2022-02-25 MED ORDER — METOCLOPRAMIDE HCL 5 MG/ML IJ SOLN
5.0000 mg | Freq: Three times a day (TID) | INTRAMUSCULAR | Status: DC
  Administered 2022-02-25 (×2): 5 mg via INTRAVENOUS
  Filled 2022-02-25 (×2): qty 2

## 2022-02-25 MED ORDER — SODIUM BICARBONATE 8.4 % IV SOLN: 100.0000 meq | Freq: Once | INTRAVENOUS | Status: AC

## 2022-02-25 MED ORDER — DEXTROSE 50 % IV SOLN
1.0000 | Freq: Once | INTRAVENOUS | Status: AC
  Administered 2022-02-25: 50 mL via INTRAVENOUS

## 2022-02-25 MED ORDER — PROSOURCE TF PO LIQD
45.0000 mL | Freq: Two times a day (BID) | ORAL | Status: DC
  Administered 2022-02-25: 45 mL
  Filled 2022-02-25: qty 45

## 2022-02-25 MED ORDER — SENNOSIDES 8.8 MG/5ML PO SYRP
10.0000 mL | ORAL_SOLUTION | Freq: Every day | ORAL | Status: DC
  Filled 2022-02-25: qty 10

## 2022-02-25 MED ORDER — VITAL 1.5 CAL PO LIQD
1000.0000 mL | ORAL | Status: DC
Start: 2022-02-25 — End: 2022-02-26
  Administered 2022-02-25: 1000 mL

## 2022-02-25 MED ORDER — DEXTROSE 10 % IV SOLN: INTRAVENOUS | Status: DC

## 2022-02-25 MED ORDER — SODIUM BICARBONATE 8.4 % IV SOLN
INTRAVENOUS | Status: AC
  Administered 2022-02-25: 100 meq via INTRAVENOUS
  Filled 2022-02-25: qty 100

## 2022-02-25 MED ORDER — FOLIC ACID 1 MG PO TABS
1.0000 mg | ORAL_TABLET | Freq: Every day | ORAL | Status: DC
  Administered 2022-02-25: 1 mg
  Filled 2022-02-25: qty 1

## 2022-02-25 MED ORDER — B COMPLEX-C PO TABS
1.0000 | ORAL_TABLET | Freq: Every day | ORAL | Status: DC
  Administered 2022-02-25: 1
  Filled 2022-02-25: qty 1

## 2022-02-25 MED ORDER — DEXTROSE 50 % IV SOLN: 50.0000 mL | Freq: Once | INTRAVENOUS | Status: AC

## 2022-02-25 MED ORDER — VITAL HIGH PROTEIN PO LIQD: 1000.0000 mL | ORAL | Status: DC

## 2022-02-25 MED ORDER — DEXTROSE 50 % IV SOLN
INTRAVENOUS | Status: AC
  Administered 2022-02-25: 50 mL via INTRAVENOUS
  Filled 2022-02-25: qty 50

## 2022-02-25 MED ORDER — SODIUM CHLORIDE 0.9% IV SOLUTION: Freq: Once | INTRAVENOUS | Status: AC

## 2022-02-25 MED ORDER — PROSOURCE TF PO LIQD
90.0000 mL | Freq: Two times a day (BID) | ORAL | Status: DC
  Administered 2022-02-25: 90 mL
  Filled 2022-02-25: qty 90

## 2022-02-25 MED FILL — Lidocaine HCl Local Preservative Free (PF) Inj 2%: INTRAMUSCULAR | Qty: 15 | Status: AC

## 2022-02-25 MED FILL — Potassium Chloride Inj 2 mEq/ML: INTRAVENOUS | Qty: 40 | Status: AC

## 2022-02-25 MED FILL — Thrombin (Recombinant) For Soln 20000 Unit: CUTANEOUS | Qty: 1 | Status: AC

## 2022-02-25 MED FILL — Heparin Sodium (Porcine) Inj 1000 Unit/ML: Qty: 1000 | Status: AC

## 2022-02-25 NOTE — Progress Notes (Addendum)
LVAD Coordinator Rounding Note: ? ?Admitted 01/24/2022 due to CHF.  ? ?HM 3 LVAD implanted on 03/04/2022 by Dr Cyndia Bent under DT criteria. ? ?Patient received methylene blue yesterday.  MAP in 70s today  ? ?CVVH started 3.4, I/Os still positive yesterday and weight up.  ?  ?WBCs 27 => 38.5 => 26, afebrile on CVVH.  On empiric vancomycin/cefepime.   ?  ?On vent, FiO2 0.8. CXR with mild pulmonary edema.  ?  ?Post-op had both AF and VT, now on amiodarone gtt 30.  ? ? ?Vital signs: ?Temp: 96.8 ?HR: 85 ?Doppler Pressure:not done ?Arterial line: 87/69 (76) ?O2 Sat: 95% on 40% FiO2 ?Wt: 263.2 lbs   ? ? ?LVAD interrogation reveals:  ? ?Speed: 5600 ?Flow: 4.6 ?Power:  4.2 ?PI: 4.0  ?Alarms: none ?Events:  none today 90+ on 3/4 ?Hematocrit: 24 ?Fixed speed: 5600 ?Low speed limit: 5300 ? ? ?Drive Line: Existing VAD dressing removed and site care performed using sterile technique. Mom observed dressing change today. Drive line exit site cleaned with Chlora prep applicators x 2, allowed to dry, and Sorbaview dressing with Silverlon patch applied. Exit site healing and unincorporated, the velour is fully implanted at exit site. 1 suture intact. No redness, tenderness, foul odor or rash noted. Moderate amount of sanguineous drainage. Drive line anchor secure. Daily dressing changes by Nurse Davonna Belling or [place] coordinator. Next dressing change 03-13-22. ? ?Labs:  ?LDH trend: 418 ? ?INR trend: 1.2 ? ?Anticoagulation Plan: ?-INR Goal: 2-2.5 ?-ASA Dose: 325 mg until INR therapeutic ? ?Blood Products:  ?- Intra op: ?02/25/2022: ?1 u PLT ?1 Cryo ?6 FFP ?1850 cc of cell saver ?02/23/22>>1 u PRBC ?02/24/22>>1 u PRBC ? ?Gtts: ?Levo: 50 mcg/min ?Fent: 225 mcg/hr ?Heparin 500 u/hr ?Versed 6 mg/hr ?Milrinone 0.125 mcg/kg/min ?Vaso 0.04 u/hr ?Amio 30 mg/hr ?Epi 10 mcg/min ? ?Device: ?-N/A ? ?Arrythmias: VT/VF during surgery; Post-Op>>AF and VT ? ?Respiratory: intubated on 80 % FiO2 w/PEEP of 8 ? ?Renal:  ?-BUN/CRT: 16/1.62 ? ?Adverse Events on VAD: ?-CVVH started  02/24/22 ? ?Patient Education: ?Pt intubated/heavily sedated. Mom observed dressing change today. Education inappropriate at this time.  ? ?Plan/Recommendations:  ? ?1. Call VAD coordinator for any VAD equipment or drive line issues ?2. Daily drive line dressing change per nurse champion or VAD coordinator. Next dressing change due Mar 13, 2022. ? ?Tanda Rockers RN, BSN ?VAD Coordinator ?24/7 Pager [phone] ? ? ? ?

## 2022-02-25 NOTE — Progress Notes (Signed)
[facility] ROUNDING NOTE   Subjective:   Interval History: This is a 51 year old gentleman with a very complicated history cardiogenic shock and biventricular congestive heart failure progressed to LVAD placement with bio prosthetic aortic valve 03/07/2022.  His operative course has been complicated by V. tach/V-fib.  He is requiring high-dose norepinephrine and vasopressin epinephrine and milrinone.  He has been unresponsive to Lasix.  And CRRT was initiated on 02/24/2022.  Blood pressure 88/69 pulse 84 temperature 96 O2 sats 98% 80% FiO2  Sodium 136 potassium 5 chloride 103 CO2 24 BUN 16 creatinine 1.6 glucose 1002 calcium 7.9 phosphorus 4.3 albumin 2.9 hemoglobin 8.1  Objective:  Vital signs in last 24 hours:  Temp:  [95.7 F (35.4 C)-100.2 F (37.9 C)] 96.1 F (35.6 C) (03/06 0745) Pulse Rate:  [83-101] 84 (03/06 0745) Resp:  [0-32] 28 (03/06 0745) BP: (73-79)/(55) 79/55 (03/05 1527) SpO2:  [90 %-99 %] 93 % (03/06 0800) Arterial Line BP: (62-110)/(50-78) 92/70 (03/06 0745) FiO2 (%):  [50 %-80 %] 80 % (03/06 0800) Weight:  [119.4 kg] 119.4 kg (03/06 0630)  Weight change: 2.4 kg Filed Weights   02/23/22 0645 02/24/22 0600 02/25/22 0630  Weight: 115.2 kg 117 kg 119.4 kg    Intake/Output: I/O last 3 completed shifts: In: 9333.4 [I.V.:7050.4; Blood:738.8; NG/GT:580; IV Piggyback:954.2] Out: 6038 [Urine:726; Emesis/NG output:1300; PYKDX:8338; Chest Tube:1010]   Intake/Output this shift:  Total I/O In: 158.5 [I.V.:158.5] Out: 496 [Urine:6; Emesis/NG output:250; Other:220; Chest Tube:20]  CVS- RRR RS- CTA ETT in place ABD- BS present soft non-distended EXT-anasarca right femoral hemodialysis catheter   Basic Metabolic Panel: Recent Labs  Lab 03/12/2022 2352 02/23/22 0001 02/23/22 0300 02/23/22 0645 02/23/22 1657 02/23/22 1804 02/23/22 2127 02/23/22 2145 02/24/22 0412 02/24/22 0418 02/24/22 1630 02/24/22 1800 02/24/22 2029 02/25/22 0158  02/25/22 0426 02/25/22 0433  NA  --    < > 133*   < > 133*   < > 130*   < > 132*   < > 135 134* 135 136 136 136  K  --    < > 5.7*   < > 5.4*   < > 5.2*   < > 5.5*   < > 5.2* 5.3* 5.1 4.9 5.0 5.0  CL  --   --  101   < > 101  --  98  --  99  --  99  --   --   --   --  103  CO2  --   --  24   < > 24  --  23  --  24  --  25  --   --   --   --  24  GLUCOSE  --   --  132*   < > 134*  --  164*  --  114*  --  121*  --   --   --   --  102*  BUN  --   --  14   < > 16  --  18  --  21*  --  22*  --   --   --   --  16  CREATININE 1.10  --  1.07   < > 1.33*  --  1.37*  --  1.68*  --  2.13*  --   --   --   --  1.62*  CALCIUM  --   --  8.2*   < > 8.1*  --  8.0*  --  8.2*  --  7.7*  --   --   --   --  7.9*  MG 2.6*  --  2.3  --  2.0  --   --   --  2.0  --   --   --   --   --   --  2.4  PHOS  --   --  3.9  --   --   --   --   --  5.3*  --  5.8*  --   --   --   --  4.3   < > = values in this interval not displayed.    Liver Function Tests: Recent Labs  Lab 02/23/22 0300 02/24/22 0412 02/24/22 1630 02/25/22 0433  AST 148* 124*  --  109*  ALT 30 25  --  17  ALKPHOS 52 56  --  59  BILITOT 3.8* 4.4*  --  7.2*  PROT 5.0* 5.1*  --  5.7*  ALBUMIN 3.4* 2.7* 2.9* 2.9*   No results for input(s): LIPASE, AMYLASE in the last 168 hours. No results for input(s): AMMONIA in the last 168 hours.  CBC: Recent Labs  Lab 02/23/22 0300 02/23/22 0645 02/23/22 1657 02/23/22 1804 02/23/22 2127 02/23/22 2145 02/24/22 0412 02/24/22 0418 02/24/22 1800 02/24/22 2029 02/25/22 0158 02/25/22 0426 02/25/22 0433  WBC 27.0*  --  33.9*  --  35.9*  --  38.5*  --   --  30.9*  --   --  25.9*  NEUTROABS 23.0*  --   --   --   --   --  32.0*  --   --   --   --   --  21.9*  HGB 8.7*   < > 7.6*   < > 8.6*   < > 7.9*   < > 8.2* 8.1*   8.2* 8.2* 8.2* 8.1*  HCT 26.6*   < > 23.8*   < > 25.7*   < > 24.1*   < > 24.0* 24.1*   24.0* 24.0* 24.0* 24.2*  MCV 90.5  --  91.5  --  91.5  --  91.6  --   --  92.3  --   --  91.7  PLT 127*   --  141*  --  142*  --  158  --   --  144*  --   --  142*   < > = values in this interval not displayed.    Cardiac Enzymes: No results for input(s): CKTOTAL, CKMB, CKMBINDEX, TROPONINI in the last 168 hours.  BNP: Invalid input(s): POCBNP  CBG: Recent Labs  Lab 02/24/22 2348 02/25/22 0156 02/25/22 0339 02/25/22 0423 02/25/22 0613  GLUCAP 119* 108* 108* 104* 101*    Microbiology: Results for orders placed or performed during the hospital encounter of 02/06/2022  Resp Panel by RT-PCR (Flu A&B, Covid) Nasopharyngeal Swab     Status: None   Collection Time: 01/27/2022  4:09 PM   Specimen: Nasopharyngeal Swab; Nasopharyngeal(NP) swabs in vial transport medium  Result Value Ref Range Status   SARS Coronavirus 2 by RT PCR NEGATIVE NEGATIVE Final    Comment: (NOTE) SARS-CoV-2 target nucleic acids are NOT DETECTED.  The SARS-CoV-2 RNA is generally detectable in upper respiratory specimens during the acute phase of infection. The lowest concentration of SARS-CoV-2 viral copies this assay can detect is 138 copies/mL. A negative result does not preclude SARS-Cov-2 infection and should not be used as the sole basis for treatment or other patient management  decisions. A negative result may occur with  improper specimen collection/handling, submission of specimen other than nasopharyngeal swab, presence of viral mutation(s) within the areas targeted by this assay, and inadequate number of viral copies(<138 copies/mL). A negative result must be combined with clinical observations, patient history, and epidemiological information. The expected result is Negative.  Fact Sheet for Patients:  EntrepreneurPulse.com.au  Fact Sheet for Healthcare Providers:  IncredibleEmployment.be  This test is no t yet approved or cleared by the [place] FDA and  has been authorized for detection and/or diagnosis of SARS-CoV-2 by FDA under an Emergency Use  Authorization (EUA). This EUA will remain  in effect (meaning this test can be used) for the duration of the COVID-19 declaration under Section 564(b)(1) of the Act, 21 U.S.C.section 360bbb-3(b)(1), unless the authorization is terminated  or revoked sooner.       Influenza A by PCR NEGATIVE NEGATIVE Final   Influenza B by PCR NEGATIVE NEGATIVE Final    Comment: (NOTE) The Xpert Xpress SARS-CoV-2/FLU/RSV plus assay is intended as an aid in the diagnosis of influenza from Nasopharyngeal swab specimens and should not be used as a sole basis for treatment. Nasal washings and aspirates are unacceptable for Xpert Xpress SARS-CoV-2/FLU/RSV testing.  Fact Sheet for Patients: EntrepreneurPulse.com.au  Fact Sheet for Healthcare Providers: IncredibleEmployment.be  This test is not yet approved or cleared by the [place] FDA and has been authorized for detection and/or diagnosis of SARS-CoV-2 by FDA under an Emergency Use Authorization (EUA). This EUA will remain in effect (meaning this test can be used) for the duration of the COVID-19 declaration under Section 564(b)(1) of the Act, 21 U.S.C. section 360bbb-3(b)(1), unless the authorization is terminated or revoked.  Performed at [facility], [place] [address], [place], [place] [address]   MRSA Next Gen by PCR, Nasal     Status: None   Collection Time: 02/13/2022  4:32 PM   Specimen: Nasal Mucosa; Nasal Swab  Result Value Ref Range Status   MRSA by PCR Next Gen NOT DETECTED NOT DETECTED Final    Comment: (NOTE) The GeneXpert MRSA Assay (FDA approved for NASAL specimens only), is one component of a comprehensive MRSA colonization surveillance program. It is not intended to diagnose MRSA infection nor to guide or monitor treatment for MRSA infections. Test performance is not FDA approved in patients less than 85 years old. Performed at [facility], [place] [address], [place],  [place] [address]   MRSA Next Gen by PCR, Nasal     Status: None   Collection Time: 02/21/22  8:36 PM   Specimen: Nasal Mucosa; Nasal Swab  Result Value Ref Range Status   MRSA by PCR Next Gen NOT DETECTED NOT DETECTED Final    Comment: (NOTE) The GeneXpert MRSA Assay (FDA approved for NASAL specimens only), is one component of a comprehensive MRSA colonization surveillance program. It is not intended to diagnose MRSA infection nor to guide or monitor treatment for MRSA infections. Test performance is not FDA approved in patients less than 56 years old. Performed at [facility], [place] [address] [place], [place] [address]     Coagulation Studies: Recent Labs    03/14/2022 1836 02/23/22 0300 02/24/22 0412 02/25/22 0433  LABPROT 10.2*   10.3* 14.2 15.8* 15.4*  INR 0.7*   0.7* 1.1 1.3* 1.2    Urinalysis: No results for input(s): COLORURINE, LABSPEC, PHURINE, GLUCOSEU, HGBUR, BILIRUBINUR, KETONESUR, PROTEINUR, UROBILINOGEN, NITRITE, LEUKOCYTESUR in the last 72 hours.  Invalid input(s): APPERANCEUR    Imaging: DG CHEST PORT 1 VIEW  Result Date: 02/25/2022 CLINICAL DATA:  51 year old male with history of left ventricular assist device. EXAM: PORTABLE CHEST 1 VIEW COMPARISON:  Chest x-ray 02/24/2022. FINDINGS: LVAD projecting over the left ventricular apex. Right internal jugular Cordis through which a Swan-Ganz catheter has been passed into the distal pulmonic trunk. An endotracheal tube is in place with tip 6.2 cm above the carina. Bilateral chest tubes are stable in position with tips and side ports projecting over the thorax bilaterally. A nasogastric tube is seen extending into the stomach, however, the tip of the nasogastric tube extends below the lower margin of the image. Status post median sternotomy for aortic valve replacement with a stented bioprosthesis. Lung volumes are low. Bibasilar opacities (left-greater-than-right) favored to predominantly reflect subsegmental  atelectasis, although airspace consolidation in the left lower lobe is not excluded. Small left pleural effusion. Right costophrenic sulcus is incompletely imaged. Cephalization of the pulmonary vasculature with slightly indistinct interstitial markings, concerning for mild interstitial pulmonary edema. Moderate cardiomegaly. IMPRESSION: 1. Postoperative changes and support apparatus, as above. 2. Mild interstitial pulmonary edema. 3. Moderate cardiomegaly. Electronically Signed   By: Vinnie Langton M.D.   On: 02/25/2022 07:52   DG CHEST PORT 1 VIEW  Result Date: 02/24/2022 CLINICAL DATA:  LVAD.  CHF.  Hypertension. EXAM: PORTABLE CHEST 1 VIEW COMPARISON:  02/23/2022 FINDINGS: LVAD is again noted projecting over the left lower chest. ET tube tip is above the carina. The nasogastric tube courses below the diaphragm. Swan-Ganz catheter tip projects over the right main pulmonary artery. Status post median sternotomy with aortic valve replacement there are bilateral chest tubes and mediastinal drain in place. No pneumothorax visualized. Mild interstitial edema is stable from previous exam. IMPRESSION: 1. Stable support apparatus. 2. No change in mild interstitial edema. 3. No pneumothorax. Electronically Signed   By: Kerby Moors M.D.   On: 02/24/2022 08:45     Medications:     prismasol BGK 4/2.5 1,200 mL/hr at 02/25/22 0412    prismasol BGK 4/2.5 600 mL/hr at 02/25/22 0440   sodium chloride 10 mL/hr at 02/25/22 0800   sodium chloride     sodium chloride     amiodarone 30 mg/hr (02/25/22 0800)   ceFEPime (MAXIPIME) IV 2 g (02/24/22 2110)   dexmedetomidine (PRECEDEX) IV infusion Stopped (02/23/22 1745)   epinephrine 8 mcg/min (02/25/22 0800)   fentaNYL infusion INTRAVENOUS 250 mcg/hr (02/25/22 0800)   heparin 500 Units/hr (02/25/22 0800)   insulin 0.9 Units/hr (02/25/22 0800)   lactated ringers     lactated ringers 20 mL/hr at 02/25/22 0800   midazolam 6 mg/hr (02/25/22 0800)   norepinephrine  (LEVOPHED) Adult infusion 50 mcg/min (02/25/22 0820)   phenylephrine (NEO-SYNEPHRINE) Adult infusion Stopped (02/24/22 1500)   prismasol BGK 4/2.5 2,000 mL/hr at 02/25/22 0820   vancomycin Stopped (02/24/22 1727)   vasopressin 0.04 Units/min (02/25/22 0800)    (feeding supplement) PROSource Plus  30 mL Oral BID BM   sodium chloride   Intravenous Once   sodium chloride   Intravenous Once   allopurinol  300 mg Per Tube Daily   arformoterol  15 mcg Nebulization BID   aspirin EC  325 mg Oral Daily   Or   aspirin  324 mg Per Tube Daily   Or   aspirin  300 mg Rectal Daily   atorvastatin  80 mg Per Tube Daily   bisacodyl  10 mg Oral Daily   Or  bisacodyl  10 mg Rectal Daily   chlorhexidine gluconate (MEDLINE KIT)  15 mL Mouth Rinse BID   Chlorhexidine Gluconate Cloth  6 each Topical Daily   docusate  100 mg Per Tube BID   mouth rinse  15 mL Mouth Rinse 10 times per day   pantoprazole (PROTONIX) IV  40 mg Intravenous QHS   polyethylene glycol  17 g Per Tube Daily   revefenacin  175 mcg Nebulization Daily   sodium chloride flush  3 mL Intravenous Q12H   sodium chloride, dextrose, fentaNYL, heparin, midazolam, morphine injection, ondansetron (ZOFRAN) IV, oxyCODONE, sodium chloride flush, traMADol  Assessment/ Plan:  Acute kidney injury secondary to ATN after LVAD and AVR 03/17/2022 with severe persistent shock.  Initiated CRRT for 02/24/2022. Congestive heart failure biventricular failure prognosis poor.  LVAD in place Shock requiring multiple pressors Anemia transfuse as needed no ESA's at this point.  LOS: [place] _0 _1 :30 AM

## 2022-02-25 NOTE — Progress Notes (Signed)
? ?NAMEDelton Walton, MRN:  [id number], DOB:  03-Apr-1971, LOS: 17 ?ADMISSION DATE:  02/11/2022, CONSULTATION DATE:  02/23/22 ?REFERRING MD:  TCTS, CHIEF COMPLAINT:  SOB  ? ?History of Present Illness:  ?51 year old man w/ hx of HTN, NICM, OSA on CPAP, smoking/EtOH abuse who presented with decompensated heart failure. Despite optimization remained in a symptomatic low slow state so underwent CABG x 1 outflow graft to RCA, aortic valve replacement and Heartmate 3 LVAD on 03/16/2022.  Procedure complicated by persistent coagulopathy and multiple runs of VF/VT.  Required amio, multiple blood preducts.  Eventually weaned off ECMO and brought to [facility].  Overnight issues with sedation and vent desynchrony requiring multitude of pushes of versed/fentanyl ? ?PCCM consulted to assist with vent management. ? ?Preop workup ?CT Chest bullous upper lobe emphysema with lower lungs preserved ?PFTs look mixed obstructive/restrictive with partially correcting but still low DLCO ? ?Pertinent  Medical History  ?NICM ?HLD ?HTN ?OSA ? ?Significant Hospital Events: ?Including procedures, antibiotic start and stop dates in addition to other pertinent events   ?Copied from CHF team note ?2/17 admitted for a/c Biventricular HF, NYHA IIIb-IV symptoms, w/ low output. Stated on milrinone. LVAD work up initiated.  ?2/23 RHC/LHC on milrinone 0.125 mcg + Norepi 2 mcg w/ 2v CAD, Severe NICM EF < 20%, Elevated filling pressures with markedly reduced CO with severe AI ?2/28 diuretics held for AKI  ?3/1 Multiple teeth extractions  ?3/3 HM-3 VAD placed with AVR and SVG (off outflow graft) to RCA, multiple episodes VT/VF, persistently coagulopathic  ?3/5 CRRT started, methylene blue, ? ?Interim History / Subjective:  ?Afebrile, WBC improving ?Slight improvement in NE requirements after methylene blue IVP ?MAPs 70's ?CVP 23 ?SVRs in 576, CI 3.2, CO 6.8 ?Remains on epi 9, NE 50, vaso 0.04, NO 30, amio 30, insulin gtt off (CBG 70), fent 250/ versed 6 ?HF  stopping milrinone today ?Continues on CRRT, starting to pull today, goal 50-123ml/hr ?Remains on FiO2 80/ 8 peep  ?No bowel sounds, 1L out/ 24hrs  ? ?Objective   ?Blood pressure (!) 79/55, pulse 84, temperature (!) 96.4 ?F (35.8 ?C), resp. rate (!) 32, height 5\' 7"  (1.702 m), weight 119.4 kg, SpO2 95 %. ?PAP: (64-92)/(24-39) 77/31 ?CVP:  [13 mmHg-39 mmHg] 24 mmHg ?CO:  [6.8 L/min-8.3 L/min] 6.8 L/min ?CI:  [3.2 L/min/m2-3.8 L/min/m2] 3.2 L/min/m2  ?Vent Mode: PRVC ?FiO2 (%):  [50 %-80 %] 80 % ?Set Rate:  [28 bmp-32 bmp] 32 bmp ?Vt Set:  [520 mL] 520 mL ?PEEP:  [8 cmH20] 8 cmH20 ?Plateau Pressure:  [27 cmH20-30 cmH20] 30 cmH20  ? ?Intake/Output Summary (Last 24 hours) at 02/25/2022 0915 ?Last data filed at 02/25/2022 0900 ?Gross per 24 hour  ?Intake 5783.23 ml  ?Output 5154 ml  ?Net 629.23 ml  ? ?Filed Weights  ? 02/23/22 0645 02/24/22 0600 02/25/22 0630  ?Weight: 115.2 kg 117 kg 119.4 kg  ? ?Examination: ?General:  critically ill adult male sedated on MV ?HEENT: MM pink/moist, ETT/ OGT, scleral icterus, pupils 4/reactive ?Neuro:  sedated, eyes open with mouth care/ suctioning, no spont movement ?CV:  junctional, mechanical hum present, LVAD with CT x 3 (pleural x 2, mediastinal) ?PULM:  MV supported breaths, clear/ diminished in bases, scant secretions, plat 27, dp 19 ?GI:  mildly distended, no BS, foley  ?Extremities: warm/dry, anasarca   ? ?ABG stable- PH 7.288/ 54/66 ?Labs: K 5, sCr 2.13 > 1.62, Mag2.4, WBC 30> 25, Hgb stable  ?LFT stable, t.bili up 4.4 >  7.2 ?LDH stable ?INR 1.2 ?Heparin level low ?CXR > some pulm edema, ETT 6.2 above carina ? ?UOP 262ml/ 24hrs ?772ml/ 56ml out from OGT ?Stable CT output, cumulative 530 ml/ 24hrs  ? ?Resolved Hospital Problem list   ?N/A ? ?Assessment & Plan:  ?Postoperative ventilator management ?Mixed ischemic and nonischmemic CM, aortic regurgitation, Postop status 03/08/2022: HM-3 VAD placed with AVR and SVG (off outflow graft) to RCA ?Post cardiotomy shock persistent, elevated  WBC, abx added per CHF team 3/5 ?Postop agitation, pain, and delirium- stable ?Periop VT/VF- on amiodarone ?Hyperkalemia, progressive renal dysfunction, oliguria, volume overload- CRRT initiated 3/5 ?Baseline emphysema and mixed obstructive restrictive lung disease ?OSA on CPAP ?Baseline tobacco and EtOH abuse ? ?- LVAD, diuretics and inotropes per TCTS and CHF teams ?- CRRT per nephrology, goal UF 50-177ml/ hr  ?- full MV support, likely not to make any headway with oxygenation until able to get fluid off ?- PAD protocol with fentanyl/ versed gtts w/bowel regimen ?- advance ETT by 1 cm ?- Continue brovana/yupelri ?- Abx per CHF team> cefepime/ vanc ?- suspicious for postop ileus, will check KUB ?- hypoglycemia- insulin gtt held ? ? ?Best Practice (right click and "Reselect all SmartList Selections" daily)  ?Per TCTS ? ?CCT 35 mins ? ? ?Posey Boyer, ACNP ?[facility] ?02/25/2022, 11:16 AM ? ?See Amion for pager ?If no response to pager, please call PCCM consult pager ?After 7:00 pm call Elink   ? ? ? ? ?

## 2022-02-25 NOTE — Progress Notes (Signed)
PT Cancellation Note ? ?Patient Details ?Name: Colin Walton ?MRN: [id number] ?DOB: Dec 13, 1971 ? ? ?Cancelled Treatment:    Reason Eval/Treat Not Completed: Patient not medically ready Pt on FiO2 80/8 PEEP and is sedated. Will check back in for when pt able to participate in therapy evaluation) ? ?Lillia Pauls, PT, DPT ?Acute Rehabilitation Services ?Pager [phone] ?Office [phone] ? ? ? ?Norval Morton ?02/25/2022, 12:29 PM ?

## 2022-02-25 NOTE — Progress Notes (Signed)
ANTICOAGULATION CONSULT NOTE ? ?Pharmacy Consult for heparin  ?Indication:  LVAD HM3 implanted 3/3 ? ?No Known Allergies ? ?Patient Measurements: ?Height: 5\' 7"  (170.2 cm) ?Weight: 119.4 kg (263 lb 3.7 oz) ?IBW/kg (Calculated) : 66.1 ? ?Vital Signs: ?Temp: 96.1 ?F (35.6 ?C) (03/06 0745) ?Pulse Rate: 84 (03/06 0745) ? ?Labs: ?Recent Labs  ?  03/09/2022 ?1836 03/16/2022 ?1841 02/23/22 ?0300 02/23/22 ?0645 02/24/22 ?04/26/22 02/24/22 ?0418 02/24/22 ?1630 02/24/22 ?1800 02/24/22 ?2029 02/25/22 ?0158 02/25/22 ?04/27/22 02/25/22 ?04/27/22  ?HGB 11.7*   < > 8.7*   < > 7.9*   < >  --    < > 8.1*  8.2* 8.2* 8.2* 8.1*  ?HCT 35.4*   < > 26.6*   < > 24.1*   < >  --    < > 24.1*  24.0* 24.0* 24.0* 24.2*  ?PLT 135*  132*   < > 127*   < > 158  --   --   --  144*  --   --  142*  ?APTT 47*  46*  --   --   --   --   --   --   --   --   --   --   --   ?LABPROT 10.2*  10.3*  --  14.2  --  15.8*  --   --   --   --   --   --  15.4*  ?INR 0.7*  0.7*  --  1.1  --  1.3*  --   --   --   --   --   --  1.2  ?HEPARINUNFRC  --   --   --   --   --   --   --   --   --   --   --  <0.10*  ?CREATININE 0.96   < > 1.07   < > 1.68*  --  2.13*  --   --   --   --  1.62*  ? < > = values in this interval not displayed.  ? ? ? ?Estimated Creatinine Clearance: 68.2 mL/min (A) (by C-G formula based on SCr of 1.62 mg/dL (H)). ? ? ?Medical History: ?Past Medical History:  ?Diagnosis Date  ? Chronic systolic CHF (congestive heart failure) (HCC)   ? Hyperlipidemia   ? Hypertension   ? Mild mitral regurgitation 2016  ? NICM (nonischemic cardiomyopathy) (HCC)   ? Obesity   ? Sleep apnea 12/24/12  ? Sleep study February 2014  ? ? ? ? ?Assessment: ?16 yoM with CHF now s/p LVAD HM3 implant 3/3. Pt with postop coagulopathy requiring novo7. Low dose heparin started 3/5. ? ?Heparin level undetectable as expected, CBC stable and LDH stable. ? ? ?Goal of Therapy:  ?Heparin level < 0.3 for now immediately post op ?INR 2-2.5 ?Monitor platelets by anticoagulation protocol: Yes ?  ?Plan:   ?Heparin 500 units/h ?Daily heparin level and CBC ? ?54, PharmD, BCPS, BCCP ?Clinical Pharmacist ?[phone] ?Please check AMION for all [facility] numbers ?02/25/2022 ? ? ? ?

## 2022-02-25 NOTE — Procedures (Signed)
Cortrak ? ?Person Inserting Tube:  Osa Craver, RD ?Tube Type:  Cortrak - 43 inches ?Tube Size:  10 ?Tube Location:  Right nare ?Initial Placement:  Stomach ?Secured by:  Callas ?Technique Used to Measure Tube Placement:  Marking at nare/corner of mouth ?Cortrak Secured At:  95 cm ? ?Cortrak Tube Team Note: ? ?Consult received to place a Cortrak feeding tube.  ? ?X-ray is required, abdominal x-ray has been ordered by the Cortrak team. Please confirm tube placement before using the Cortrak tube.  ? ?If the tube becomes dislodged please keep the tube and contact the Cortrak team at [email or web address] (password [email or web address]) for replacement.  ?If after hours and replacement cannot be delayed, place a NG tube and confirm placement with an abdominal x-ray.  ? ? ?Romelle Starcher MS, RDN, LDN, CNSC ?Registered Dietitian III ?Clinical Nutrition ?RD Pager and On-Call Pager Number Located in [place]  ? ? ?

## 2022-02-25 NOTE — Addendum Note (Signed)
Addendum  created 02/25/22 0857 by Adair Laundry, CRNA  ? Order list changed, Pharmacy for encounter modified  ?  ?

## 2022-02-25 NOTE — Progress Notes (Signed)
Patient ID: Colin Walton, male   DOB: 10-May-1971, 51 y.o.   MRN: [id number] HeartMate 3 Rounding Note  Subjective:    Hemodynamics still very labile with low SVR 500's. Some improvement after Methylene blue yesterday but not long lived. Have had to go up on NE today when trying to pull off volume with CRRT.  Now NE 72 mcg, epi 12, vaso 0.04. Milrinone turned off since CI good.  Co-ox 78 this am. PA 80/29 CVP 19 CI 3.2 SVR 576  LVAD INTERROGATION:  HeartMate IIl LVAD:  Flow 4.7 liters/min, speed 5600, power 4.2, PI 4.1.    Objective:    Vital Signs:   Temp:  [95.7 F (35.4 C)-97.5 F (36.4 C)] 97.5 F (36.4 C) (03/06 1415) Pulse Rate:  [83-96] 93 (03/06 1415) Resp:  [0-33] 32 (03/06 1415) BP: (79)/(55) 79/55 (03/05 1527) SpO2:  [89 %-99 %] 92 % (03/06 1415) Arterial Line BP: (71-110)/(52-78) 76/58 (03/06 1415) FiO2 (%):  [50 %-80 %] 80 % (03/06 1200) Weight:  [119.4 kg] 119.4 kg (03/06 0630) Last BM Date : 02/28/2022 Mean arterial Pressure 64  Intake/Output:   Intake/Output Summary (Last 24 hours) at 02/25/2022 1441 Last data filed at 02/25/2022 1400 Gross per 24 hour  Intake 5428.91 ml  Output 5644 ml  Net -215.09 ml     Physical Exam: General:  anasarca, on vent HEENT: intubated Cor: Distant heart sounds with LVAD hum present. Lungs: clear Abdomen: protuberant, soft, few bowel sounds. Extremities: marked edema Neuro: sedated on vent, not responsive.  Telemetry: NSR vs accelerated junctional.  Labs: Basic Metabolic Panel: Recent Labs  Lab 02/24/2022 2352 02/23/22 0001 02/23/22 0300 02/23/22 0645 02/23/22 1657 02/23/22 1804 02/23/22 2127 02/23/22 2145 02/24/22 0412 02/24/22 0418 02/24/22 1630 02/24/22 1800 02/25/22 0158 02/25/22 0426 02/25/22 0433 02/25/22 1155 02/25/22 1245  NA  --    < > 133*   < > 133*   < > 130*   < > 132*   < > 135   < > 136 136 136 135 137  K  --    < > 5.7*   < > 5.4*   < > 5.2*   < > 5.5*   < > 5.2*   < > 4.9 5.0 5.0 5.2*  5.1  CL  --   --  101   < > 101  --  98  --  99  --  99  --   --   --  103  --   --   CO2  --   --  24   < > 24  --  23  --  24  --  25  --   --   --  24  --   --   GLUCOSE  --   --  132*   < > 134*  --  164*  --  114*  --  121*  --   --   --  102*  --   --   BUN  --   --  14   < > 16  --  18  --  21*  --  22*  --   --   --  16  --   --   CREATININE 1.10  --  1.07   < > 1.33*  --  1.37*  --  1.68*  --  2.13*  --   --   --  1.62*  --   --   CALCIUM  --   --  8.2*   < > 8.1*  --  8.0*  --  8.2*  --  7.7*  --   --   --  7.9*  --   --   MG 2.6*  --  2.3  --  2.0  --   --   --  2.0  --   --   --   --   --  2.4  --   --   PHOS  --   --  3.9  --   --   --   --   --  5.3*  --  5.8*  --   --   --  4.3  --   --    < > = values in this interval not displayed.    Liver Function Tests: Recent Labs  Lab 02/23/22 0300 02/24/22 0412 02/24/22 1630 02/25/22 0433  AST 148* 124*  --  109*  ALT 30 25  --  17  ALKPHOS 52 56  --  59  BILITOT 3.8* 4.4*  --  7.2*  PROT 5.0* 5.1*  --  5.7*  ALBUMIN 3.4* 2.7* 2.9* 2.9*   No results for input(s): LIPASE, AMYLASE in the last 168 hours. No results for input(s): AMMONIA in the last 168 hours.  CBC: Recent Labs  Lab 02/23/22 0300 02/23/22 0645 02/23/22 1657 02/23/22 1804 02/23/22 2127 02/23/22 2145 02/24/22 0412 02/24/22 0418 02/24/22 2029 02/25/22 0158 02/25/22 0426 02/25/22 0433 02/25/22 1155 02/25/22 1245  WBC 27.0*  --  33.9*  --  35.9*  --  38.5*  --  30.9*  --   --  25.9*  --   --   NEUTROABS 23.0*  --   --   --   --   --  32.0*  --   --   --   --  21.9*  --   --   HGB 8.7*   < > 7.6*   < > 8.6*   < > 7.9*   < > 8.1*   8.2* 8.2* 8.2* 8.1* 7.8* 7.5*  HCT 26.6*   < > 23.8*   < > 25.7*   < > 24.1*   < > 24.1*   24.0* 24.0* 24.0* 24.2* 23.0* 22.0*  MCV 90.5  --  91.5  --  91.5  --  91.6  --  92.3  --   --  91.7  --   --   PLT 127*  --  141*  --  142*  --  158  --  144*  --   --  142*  --   --    < > = values in this interval not displayed.     INR: Recent Labs  Lab 03/18/2022 1836 02/23/22 0300 02/24/22 0412 02/25/22 0433  INR 0.7*   0.7* 1.1 1.3* 1.2    Other results: EKG:   Imaging: DG Abd 1 View  Result Date: 02/25/2022 CLINICAL DATA:  Ileus EXAM: ABDOMEN - 1 VIEW COMPARISON:  02/13/2022 FINDINGS: Nonobstructive bowel gas pattern. Enteric tube terminates within the gastric body. Examination is somewhat limited secondary to poor penetration related to patient habitus. Partially visualized postsurgical changes within the chest, as detailed on same day dedicated chest radiograph. IMPRESSION: Nonobstructive bowel gas pattern. Enteric tube terminates within the gastric body. Electronically Signed   By: Davina Poke D.O.   On: 02/25/2022 10:28   DG CHEST PORT 1 VIEW  Result Date: 02/25/2022 CLINICAL DATA:  51 year old male with history of  left ventricular assist device. EXAM: PORTABLE CHEST 1 VIEW COMPARISON:  Chest x-ray 02/24/2022. FINDINGS: LVAD projecting over the left ventricular apex. Right internal jugular Cordis through which a Swan-Ganz catheter has been passed into the distal pulmonic trunk. An endotracheal tube is in place with tip 6.2 cm above the carina. Bilateral chest tubes are stable in position with tips and side ports projecting over the thorax bilaterally. A nasogastric tube is seen extending into the stomach, however, the tip of the nasogastric tube extends below the lower margin of the image. Status post median sternotomy for aortic valve replacement with a stented bioprosthesis. Lung volumes are low. Bibasilar opacities (left-greater-than-right) favored to predominantly reflect subsegmental atelectasis, although airspace consolidation in the left lower lobe is not excluded. Small left pleural effusion. Right costophrenic sulcus is incompletely imaged. Cephalization of the pulmonary vasculature with slightly indistinct interstitial markings, concerning for mild interstitial pulmonary edema. Moderate  cardiomegaly. IMPRESSION: 1. Postoperative changes and support apparatus, as above. 2. Mild interstitial pulmonary edema. 3. Moderate cardiomegaly. Electronically Signed   By: Vinnie Langton M.D.   On: 02/25/2022 07:52   DG CHEST PORT 1 VIEW  Result Date: 02/24/2022 CLINICAL DATA:  LVAD.  CHF.  Hypertension. EXAM: PORTABLE CHEST 1 VIEW COMPARISON:  02/23/2022 FINDINGS: LVAD is again noted projecting over the left lower chest. ET tube tip is above the carina. The nasogastric tube courses below the diaphragm. Swan-Ganz catheter tip projects over the right main pulmonary artery. Status post median sternotomy with aortic valve replacement there are bilateral chest tubes and mediastinal drain in place. No pneumothorax visualized. Mild interstitial edema is stable from previous exam. IMPRESSION: 1. Stable support apparatus. 2. No change in mild interstitial edema. 3. No pneumothorax. Electronically Signed   By: Kerby Moors M.D.   On: 02/24/2022 08:45     Medications:     Scheduled Medications:  sodium chloride   Intravenous Once   sodium chloride   Intravenous Once   allopurinol  300 mg Per Tube Daily   arformoterol  15 mcg Nebulization BID   aspirin EC  325 mg Oral Daily   Or   aspirin  324 mg Per Tube Daily   Or   aspirin  300 mg Rectal Daily   atorvastatin  80 mg Per Tube Daily   chlorhexidine gluconate (MEDLINE KIT)  15 mL Mouth Rinse BID   Chlorhexidine Gluconate Cloth  6 each Topical Daily   docusate  100 mg Per Tube BID   feeding supplement (PROSource TF)  45 mL Per Tube BID   feeding supplement (VITAL HIGH PROTEIN)  1,000 mL Per Tube Q24H   mouth rinse  15 mL Mouth Rinse 10 times per day   metoCLOPramide (REGLAN) injection  5 mg Intravenous Q8H   pantoprazole (PROTONIX) IV  40 mg Intravenous QHS   polyethylene glycol  17 g Per Tube Daily   revefenacin  175 mcg Nebulization Daily   sennosides  10 mL Per Tube Daily   sodium chloride flush  3 mL Intravenous Q12H    Infusions:    prismasol BGK 4/2.5 1,200 mL/hr at 02/25/22 1406    prismasol BGK 4/2.5 600 mL/hr at 02/25/22 1413   sodium chloride Stopped (02/25/22 1031)   sodium chloride     sodium chloride     amiodarone 30 mg/hr (02/25/22 1400)   ceFEPime (MAXIPIME) IV 2 g (02/25/22 0949)   dexmedetomidine (PRECEDEX) IV infusion Stopped (02/23/22 1745)   epinephrine 12 mcg/min (02/25/22 1400)   fentaNYL infusion  INTRAVENOUS 200 mcg/hr (02/25/22 1400)   heparin 500 Units/hr (02/25/22 1400)   insulin Stopped (02/25/22 1410)   lactated ringers     lactated ringers 20 mL/hr at 02/25/22 1400   midazolam 6 mg/hr (02/25/22 1400)   norepinephrine (LEVOPHED) Adult infusion 72 mcg/min (02/25/22 1403)   phenylephrine (NEO-SYNEPHRINE) Adult infusion Stopped (02/24/22 1500)   prismasol BGK 4/2.5 2,000 mL/hr at 02/25/22 1314   vancomycin Stopped (02/24/22 1727)   vasopressin 0.04 Units/min (02/25/22 1400)    PRN Medications: sodium chloride, dextrose, fentaNYL, heparin, midazolam, morphine injection, ondansetron (ZOFRAN) IV, oxyCODONE, sodium chloride flush, traMADol   Assessment:   Acute on chronic biventricular heart failure with cardiogenic shock and LVEF< 20% 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   POD 3 s/p HM 3 LVAD, AVR with 25 mm pericardial valve and CABG x 1 to PDA with SVG.   Intraop coagulopathy requiring multiple units of FFP, cryo, plts still with bleeding from needle holes and raw surface with abnormal TEG after that and required half dose Factor Vll to achieve adequate hemostasis to leave the OR.    Preop PFT's with severe obstructive and restrictive lung disease and severe decrease in diffusion capacity with bullous disease of upper lobes on chest CT. Baseline PCO2 48 on preop ABG and in OR after intubation PCO2 55. Acute postop hypoxemic and hypercarbic respiratory with vent dependency. Suspect he will be difficult to  extubate and keep off vent. CXR postop showed RUL collapse which is improved.  Acute kidney injury with oliguria now on CVVH.    Postop atrial fib and VT. On IV amio.   OSA on CPAP   Morbid obesity with BMI 40.  Low grade fever and leukocytosis that may be inflammatory but with low SVR/shock he was started on broad spectrum coverage with cefepime and vanc.  Liver dysfunction with TBili up to 7.2 likely related to high right sided pressures and liver congestion. Has also had hypoglycemia today.  Acute postop blood loss anemia. Would try to keep Hgb >8 but want to avoid more fluid at this time. Plan/Discussion:    He remains in critical condition with multisystem organ dysfunction. VAD flows and CI have been very stable. He had significant RV dysfunction preop but hemodynamics have been stable despite high PAP and CVP. Would like to continue to get volume off as tolerated. Unfortunately as soon as we did this today he had increase pressor requirement. Hopefully this vasoplegia will improve soon. Minimize sedation as tolerated to avoid vasodilation.   Cortrak tube being inserted for tube feeds.   Heparin for anticoagulation for now.  I reviewed the LVAD parameters from today, and compared the results to the patient's prior recorded data.  No programming changes were made.  The LVAD is functioning within specified parameters. LVAD interrogation was negative for any significant power changes, alarms or PI events/speed drops.  LVAD equipment check completed and is in good working order.  Back-up equipment present.   Length of Stay: [place] 02/25/2022, 2:41 PM

## 2022-02-25 NOTE — Progress Notes (Signed)
OT Cancellation Note ? ?Patient Details ?Name: Colin Walton ?MRN: [id number] ?DOB: Sep 11, 1971 ? ? ?Cancelled Treatment:    Reason Eval/Treat Not Completed: Medical issues which prohibited therapy (Intubated and sedated. Will return as schedule allows.) ? ?Lataya Varnell M Brendia Dampier ?Silviano Neuser MSOT, OTR/L ?Acute Rehab ?Pager: [phone] ?Office: [phone] ?02/25/2022, 11:07 AM ?

## 2022-02-25 NOTE — Progress Notes (Signed)
Patient ID: Colin Walton, male   DOB: Nov 03, 1971, 51 y.o.   MRN: [id number]     Advanced Heart Failure Rounding Note  PCP-Cardiologist: Lauree Chandler, MD   Subjective:    2/17 admitted for a/c Biventricular HF, NYHA IIIb-IV symptoms, w/ low output. Stated on milrinone. LVAD work up initiated.  2/23 RHC/LHC on milrinone 0.125 mcg + Norepi 2 mcg w/ 2v CAD, Severe NICM EF < 20%, Elevated filling pressures with markedly reduced CO with severe AI 2/28 diuretics held for AKI  3/1 Multiple teeth extractions  3/3 HM-3 VAD placed with bioprosthetic AVR and SVG (off outflow graft) to RCA. Complicated OR case with multiple episodes VT/VF. Persistently coagulopathic. 3/5 CVVH started  Patient received methylene blue yesterday.  MAP in 70s today on epinephrine 8, milrinone 0.125, NE 50, vasopressin 0.04, NO 30.   CVVH started 3.4, I/Os still positive yesterday and weight up.   WBCs 27 => 38.5 => 26, afebrile on CVVH.  On empiric vancomycin/cefepime.    On vent, FiO2 0.8. CXR with mild pulmonary edema.   Post-op had both AF and VT, now on amiodarone gtt 30.   Swan: CVP >20 PA 77/31 PAPI 2.05 CI 3.2 Co-ox 71%  LVAD Interrogation HM 3: Speed: 5600 Flow: 4.7 PI: 4.1 Power: 4.2. No PI events   Objective:   Weight Range: 119.4 kg Body mass index is 41.23 kg/m.   Vital Signs:   Temp:  [95.7 F (35.4 C)-100.2 F (37.9 C)] 96.1 F (35.6 C) (03/06 0745) Pulse Rate:  [83-101] 84 (03/06 0745) Resp:  [0-32] 28 (03/06 0745) BP: (73-79)/(55) 79/55 (03/05 1527) SpO2:  [90 %-99 %] 93 % (03/06 0800) Arterial Line BP: (62-110)/(50-78) 92/70 (03/06 0745) FiO2 (%):  [50 %-80 %] 80 % (03/06 0800) Weight:  [119.4 kg] 119.4 kg (03/06 0630) Last BM Date : 03/12/2022  Weight change: Filed Weights   02/23/22 0645 02/24/22 0600 02/25/22 0630  Weight: 115.2 kg 117 kg 119.4 kg    Intake/Output:   Intake/Output Summary (Last 24 hours) at 02/25/2022 0817 Last data filed at 02/25/2022  0800 Gross per 24 hour  Intake 5836.74 ml  Output 4934 ml  Net 902.74 ml    Physical Exam   General: Sedated on vent  HEENT: Normal. Neck: Supple, JVP 16 cm. Carotids OK.  Cardiac:  Mechanical heart sounds with LVAD hum present.  Lungs:  CTAB, normal effort.  Abdomen:  NT, ND, no HSM. No bruits or masses. +BS  LVAD exit site: Well-healed and incorporated. Dressing dry and intact. No erythema or drainage. Stabilization device present and accurately applied. Driveline dressing changed daily per sterile technique. Extremities:  Warm and dry. No cyanosis, clubbing, rash. 1+ ankle edema.  Neuro:  Alert & oriented x 3. Cranial nerves grossly intact. Moves all 4 extremities w/o difficulty. Affect pleasant     Telemetry   NSR vs accelerated junctional in 90s (personally reviewed)   Labs    CBC Recent Labs    02/24/22 0412 02/24/22 0418 02/24/22 2029 02/25/22 0158 02/25/22 0426 02/25/22 0433  WBC 38.5*  --  30.9*  --   --  25.9*  NEUTROABS 32.0*  --   --   --   --  21.9*  HGB 7.9*   < > 8.1*   8.2*   < > 8.2* 8.1*  HCT 24.1*   < > 24.1*   24.0*   < > 24.0* 24.2*  MCV 91.6  --  92.3  --   --  91.7  PLT 158  --  144*  --   --  142*   < > = values in this interval not displayed.   Basic Metabolic Panel Recent Labs    02/24/22 0412 02/24/22 0418 02/24/22 1630 02/24/22 1800 02/25/22 0426 02/25/22 0433  NA 132*   < > 135   < > 136 136  K 5.5*   < > 5.2*   < > 5.0 5.0  CL 99  --  99  --   --  103  CO2 24  --  25  --   --  24  GLUCOSE 114*  --  121*  --   --  102*  BUN 21*  --  22*  --   --  16  CREATININE 1.68*  --  2.13*  --   --  1.62*  CALCIUM 8.2*  --  7.7*  --   --  7.9*  MG 2.0  --   --   --   --  2.4  PHOS 5.3*  --  5.8*  --   --  4.3   < > = values in this interval not displayed.   Liver Function Tests Recent Labs    02/24/22 0412 02/24/22 1630 02/25/22 0433  AST 124*  --  109*  ALT 25  --  17  ALKPHOS 56  --  59  BILITOT 4.4*  --  7.2*  PROT 5.1*  --   5.7*  ALBUMIN 2.7* 2.9* 2.9*     No results for input(s): LIPASE, AMYLASE in the last 72 hours. Cardiac Enzymes No results for input(s): CKTOTAL, CKMB, CKMBINDEX, TROPONINI in the last 72 hours.  BNP: BNP (last 3 results) Recent Labs    02/02/2022 1649 02/11/22 0345 02/23/22 0500  BNP 1,303.2* 166.8* 616.6*    ProBNP (last 3 results) No results for input(s): PROBNP in the last 8760 hours.   D-Dimer Recent Labs    03/03/2022 1836  DDIMER 1.04*   Hemoglobin A1C No results for input(s): HGBA1C in the last 72 hours.   Fasting Lipid Panel No results for input(s): CHOL, HDL, LDLCALC, TRIG, CHOLHDL, LDLDIRECT in the last 72 hours.  Thyroid Function Tests No results for input(s): TSH, T4TOTAL, T3FREE, THYROIDAB in the last 72 hours.  Invalid input(s): FREET3   Other results:   Imaging    DG CHEST PORT 1 VIEW  Result Date: 02/25/2022 CLINICAL DATA:  51 year old male with history of left ventricular assist device. EXAM: PORTABLE CHEST 1 VIEW COMPARISON:  Chest x-ray 02/24/2022. FINDINGS: LVAD projecting over the left ventricular apex. Right internal jugular Cordis through which a Swan-Ganz catheter has been passed into the distal pulmonic trunk. An endotracheal tube is in place with tip 6.2 cm above the carina. Bilateral chest tubes are stable in position with tips and side ports projecting over the thorax bilaterally. A nasogastric tube is seen extending into the stomach, however, the tip of the nasogastric tube extends below the lower margin of the image. Status post median sternotomy for aortic valve replacement with a stented bioprosthesis. Lung volumes are low. Bibasilar opacities (left-greater-than-right) favored to predominantly reflect subsegmental atelectasis, although airspace consolidation in the left lower lobe is not excluded. Small left pleural effusion. Right costophrenic sulcus is incompletely imaged. Cephalization of the pulmonary vasculature with slightly  indistinct interstitial markings, concerning for mild interstitial pulmonary edema. Moderate cardiomegaly. IMPRESSION: 1. Postoperative changes and support apparatus, as above. 2. Mild interstitial pulmonary edema. 3. Moderate cardiomegaly. Electronically Signed   By:  Vinnie Langton M.D.   On: 02/25/2022 07:52     Medications:     Scheduled Medications:  (feeding supplement) PROSource Plus  30 mL Oral BID BM   sodium chloride   Intravenous Once   sodium chloride   Intravenous Once   allopurinol  300 mg Per Tube Daily   arformoterol  15 mcg Nebulization BID   aspirin EC  325 mg Oral Daily   Or   aspirin  324 mg Per Tube Daily   Or   aspirin  300 mg Rectal Daily   atorvastatin  80 mg Per Tube Daily   bisacodyl  10 mg Oral Daily   Or   bisacodyl  10 mg Rectal Daily   chlorhexidine gluconate (MEDLINE KIT)  15 mL Mouth Rinse BID   Chlorhexidine Gluconate Cloth  6 each Topical Daily   docusate  100 mg Per Tube BID   mouth rinse  15 mL Mouth Rinse 10 times per day   pantoprazole (PROTONIX) IV  40 mg Intravenous QHS   polyethylene glycol  17 g Per Tube Daily   revefenacin  175 mcg Nebulization Daily   sodium chloride flush  3 mL Intravenous Q12H    Infusions:   prismasol BGK 4/2.5 1,200 mL/hr at 02/25/22 0412    prismasol BGK 4/2.5 600 mL/hr at 02/25/22 0440   sodium chloride 10 mL/hr at 02/25/22 0800   sodium chloride     sodium chloride     amiodarone 30 mg/hr (02/25/22 0800)   ceFEPime (MAXIPIME) IV 2 g (02/24/22 2110)   dexmedetomidine (PRECEDEX) IV infusion Stopped (02/23/22 1745)   epinephrine 8 mcg/min (02/25/22 0800)   fentaNYL infusion INTRAVENOUS 250 mcg/hr (02/25/22 0800)   heparin 500 Units/hr (02/25/22 0800)   insulin 0.9 Units/hr (02/25/22 0800)   lactated ringers     lactated ringers 20 mL/hr at 02/25/22 0800   midazolam 6 mg/hr (02/25/22 0800)   norepinephrine (LEVOPHED) Adult infusion 50 mcg/min (02/25/22 0800)   phenylephrine (NEO-SYNEPHRINE) Adult infusion  Stopped (02/24/22 1500)   prismasol BGK 4/2.5 2,000 mL/hr at 02/25/22 0412   vancomycin Stopped (02/24/22 1727)   vasopressin 0.04 Units/min (02/25/22 0800)    PRN Medications: sodium chloride, dextrose, fentaNYL, heparin, midazolam, morphine injection, ondansetron (ZOFRAN) IV, oxyCODONE, sodium chloride flush, traMADol   Assessment/Plan   1. Acute on Chronic Biventricular Heart Failure-> cardiogenic shock - cMRI 8/21 EF 24% Moderate AI. No LGE or infiltrative process. - Echo 02/17/2022 EF < 20% with mild LV dilation, RV severely HK with moderate dilation and D-shaped septum, AI moderate to severe.  - Suspect CM related to HTN +/- ETOH. AI may be contributing.  - R/LHC 02/15/2022 3v CAD/ RHC with elevated filling pressures.  - Not transplant candidate due to smoking and lung disease - 3/3 HM-3 VAD placed with AVR and SVG (off outflow graft) to RCA - He remains on epinephrine 8, NE 50, milrinone 0.125, vasopressin 0.04, NO 30 ppm. He had methylene blue yesterday, NE dose down today. Cardiac output good this morning with MAP 70s.  Has had suspected post-op vasoplegia with low SVR.  Stop milrinone today.  With low SVR, low grade fever and rising WBCs, also covering with broad spectrum abx.  - Now on CVVH with AKI and poor diuresis.  With improved MAP, need to pull fluid today. Start with aiming for net UF 50-100 cc/hr.   2. HM-3 VAD - VAD interrogated personally. Parameters stable. - On heparin gtt.    3. Acute hypoxic/post-op  respiratory failure - Pre-op PFTs with severe obstruction and restriction, emphysema on CT chest.  Significant underlying COPD - Remains on FiO2 0.8, suspect very slow vent wean with volume overload and underlying lung disease, may need trach.  CCM managing vent. - Push volume removal today.   4. Acute coagulopathy - given multiple units of product in OR + 1/2 dose Factor 7 - follow closely - transfuse hgb < 8. Hgb 8.1 today.    5. Moderate-severe aortic  regurgitation - Moderate to severe AI on echo 02/15/2022.  Suspect AI is not cause of cardiomyopathy but is likely making symptoms worse.  - Bioprosthetic AVR with LVAD.   6. CAD - cath 3v CAD LAD 40% D1 90%, OM 90% mRCA 80% - SVG from outflow graft to RCA at time of VAD  7. VT/VF - continue IV amio gtt 30 mg/hr.  - Keep K >4.0 Mg > 2.0    8. OSA   9. Atrial fibrillation - Noted post-op.  On amiodarone gtt now and ?NSR vs accelerated junctional rhythm.   10. ID - post-op fever Tm 100.4, WBCs 27 => 38.5 => 26.  May be post-op inflammatory state but with very low SVR/shock, will cover with broad spectrum abx. On cefepime/vanco.    11. AKI - CVVH begun 3/5, as above pull net negative 50-100 cc/hr today.    CRITICAL CARE Performed by: Loralie Champagne  Total critical care time: 45 minutes  Critical care time was exclusive of separately billable procedures and treating other patients.  Critical care was necessary to treat or prevent imminent or life-threatening deterioration.  Critical care was time spent personally by me (independent of midlevel providers or residents) on the following activities: development of treatment plan with patient and/or surrogate as well as nursing, discussions with consultants, evaluation of patient's response to treatment, examination of patient, obtaining history from patient or surrogate, ordering and performing treatments and interventions, ordering and review of laboratory studies, ordering and review of radiographic studies, pulse oximetry and re-evaluation of patient's condition.  Length of Stay: 17  Loralie Champagne, MD  02/25/2022, 8:17 AM  Advanced Heart Failure Team Pager [phone] (M-F; 7a - 5p)  Please contact [facility] for night-coverage after hours (5p -7a ) and weekends on [email or web address]   .

## 2022-02-25 NOTE — Progress Notes (Addendum)
Nutrition Follow-up ? ?DOCUMENTATION CODES:  ? ?Not applicable ? ?INTERVENTION:  ?Once Cortrak NGT placed and ready for use, ? ?Trickle tube feeds using Vital 1.5 cal formula at rate of 20 ml/hr.  ? ?Provide B complex with C once daily per tube. ? ?Folic acid 1 mg once daily per tube.  ? ?NUTRITION DIAGNOSIS:  ? ?Increased nutrient needs related to chronic illness as evidenced by estimated needs; ongoing ? ?GOAL:  ? ?Provide needs based on ASPEN/SCCM guidelines; to be met by TF ? ?MONITOR:  ? ?TF tolerance, Vent status, Labs, Weight trends, Skin, I & O's ? ?REASON FOR ASSESSMENT:  ? ?Consult ?LVAD Eval ? ?ASSESSMENT:  ? ?51 yo male admitted with acute on chronic biventricular heart failure with work-up for LVAD. PMH includes HTN, OSA on CPAP, tobacco use/EtOH ? ?3/01 Multiple Dental Extractions ?3/03 Implantation of HeartMate 3 LVAD, Coronary artery bypass grafting x 1: SVG to PDA, AVR with 25 mm pericardial valve, intubated ?3/5 CRRT started, methylene blue ? ?Patient is currently intubated on ventilator support ?MV: 17.2 L/min ?Temp (24hrs), Avg:96.6 ?F (35.9 ?C), Min:95.7 ?F (35.4 ?C), Max:97.5 ?F (36.4 ?C) ? ?Pt remains on high dose pressors and vent settings per MD. Pt with postoperative shock, volume overload. Plans for Cortrak NGT post pyloric placement today. Per MD trial reglan and trickle tube feeds. RD to order B complex with C MVI and folic acid as pt now on CRRT. ? ?Once able to advance past trickle rate, recommend Vital 1.5 cal formula and increase by 10 ml every 4 hours to goal rate of 55 ml/hr with 90 ml Prosource TF BID to provide 2140 kcal and 133 grams of protein. ? ?Labs and medications reviewed.  ?OGT in place to suction output 750 ml x 24 hours.  ? ?Diet Order:   ?Diet Order   ? ? None  ? ?  ? ? ?EDUCATION NEEDS:  ? ?Education needs have been addressed ? ?Skin:  Skin Assessment: Skin Integrity Issues: ?Skin Integrity Issues:: Incisions ?Incisions: chest, R leg ? ?Last BM:  3/3 ? ?Height:  ? ?Ht  Readings from Last 1 Encounters:  ?03/01/2022 5' 7"  (1.702 m)  ? ? ?Weight:  ? ?Wt Readings from Last 1 Encounters:  ?02/25/22 119.4 kg  ? ?BMI:  Body mass index is 41.23 kg/m?. ? ?Estimated Nutritional Needs:  ? ?Kcal:  1800-2100 ? ?Protein:  130-140 grams ? ?Fluid:  >/= 2 L ? ?Corrin Parker, MS, RD, LDN ?RD pager number/after hours weekend pager number on Amion. ? ?

## 2022-02-25 NOTE — Progress Notes (Deleted)
Nutrition Follow-up ? ?DOCUMENTATION CODES:  ? ?Not applicable ? ?INTERVENTION:  ?Once Cortrak NGT placed and ready for use, ? ?Trickle tube feeds using Vital 1.5 cal formula at rate of 20 ml/hr.  ? ?Provide B complex with C once daily per tube. ? ?Folic acid 1 mg once daily per tube.  ? ?NUTRITION DIAGNOSIS:  ? ?Increased nutrient needs related to chronic illness as evidenced by estimated needs; ongoing ? ?GOAL:  ? ?Provide needs based on ASPEN/SCCM guidelines; to be met by TF ? ?MONITOR:  ? ?TF tolerance, Vent status, Labs, Weight trends, Skin, I & O's ? ?REASON FOR ASSESSMENT:  ? ?Consult ?LVAD Eval ? ?ASSESSMENT:  ? ?51 yo male admitted with acute on chronic biventricular heart failure with work-up for LVAD. PMH includes HTN, OSA on CPAP, tobacco use/EtOH ? ?3/01 Multiple Dental Extractions ?3/03 Implantation of HeartMate 3 LVAD, Coronary artery bypass grafting x 1: SVG to PDA, AVR with 25 mm pericardial valve, intubated ?3/5 CRRT started, methylene blue ? ?Patient is currently intubated on ventilator support ?MV: 17.2 L/min ?Temp (24hrs), Avg:96.6 ?F (35.9 ?C), Min:95.7 ?F (35.4 ?C), Max:97.5 ?F (36.4 ?C) ? ?Pt remains on high dose pressors and vent settings per MD. Pt with postoperative shock, volume overload. Plans for Cortrak NGT placement today. Per MD trial reglan and trickle tube feeds.  ? ?Once able to advance past trickle rate, recommend Vital 1.5 cal formula and increase by 10 ml every 4 hours to goal rate of 55 ml/hr with 90 ml Prosource TF BID to provide 2140 kcal and 133 grams of protein. ? ?Labs and medications reviewed.  ? ?Diet Order:   ?Diet Order   ? ? None  ? ?  ? ? ?EDUCATION NEEDS:  ? ?Education needs have been addressed ? ?Skin:  Skin Assessment: Skin Integrity Issues: ?Skin Integrity Issues:: Incisions ?Incisions: chest, R leg ? ?Last BM:  3/3 ? ?Height:  ? ?Ht Readings from Last 1 Encounters:  ?02/27/2022 5' 7"  (1.702 m)  ? ? ?Weight:  ? ?Wt Readings from Last 1 Encounters:  ?02/25/22 119.4  kg  ? ?BMI:  Body mass index is 41.23 kg/m?. ? ?Estimated Nutritional Needs:  ? ?Kcal:  1800-2100 ? ?Protein:  130-140 grams ? ?Fluid:  >/= 2 L ? ?Corrin Parker, MS, RD, LDN ?RD pager number/after hours weekend pager number on Amion. ? ?

## 2022-02-26 ENCOUNTER — Encounter (HOSPITAL_COMMUNITY): Payer: Self-pay | Admitting: Surgery

## 2022-02-26 ENCOUNTER — Inpatient Hospital Stay (HOSPITAL_COMMUNITY): Payer: 59

## 2022-02-26 DIAGNOSIS — I5023 Acute on chronic systolic (congestive) heart failure: Secondary | ICD-10-CM | POA: Diagnosis not present

## 2022-02-26 LAB — CBC WITH DIFFERENTIAL/PLATELET
Abs Immature Granulocytes: 0.79 10*3/uL — ABNORMAL HIGH (ref 0.00–0.07)
Basophils Absolute: 0 10*3/uL (ref 0.0–0.1)
Basophils Relative: 0 %
Eosinophils Absolute: 0 10*3/uL (ref 0.0–0.5)
Eosinophils Relative: 0 %
HCT: 24.2 % — ABNORMAL LOW (ref 39.0–52.0)
Hemoglobin: 7.9 g/dL — ABNORMAL LOW (ref 13.0–17.0)
Immature Granulocytes: 4 %
Lymphocytes Relative: 4 %
Lymphs Abs: 0.8 10*3/uL (ref 0.7–4.0)
MCH: 31.3 pg (ref 26.0–34.0)
MCHC: 32.6 g/dL (ref 30.0–36.0)
MCV: 96 fL (ref 80.0–100.0)
Monocytes Absolute: 3 10*3/uL — ABNORMAL HIGH (ref 0.1–1.0)
Monocytes Relative: 13 %
Neutro Abs: 17.9 10*3/uL — ABNORMAL HIGH (ref 1.7–7.7)
Neutrophils Relative %: 79 %
Platelets: UNDETERMINED 10*3/uL (ref 150–400)
RBC: 2.52 MIL/uL — ABNORMAL LOW (ref 4.22–5.81)
RDW: 15.8 % — ABNORMAL HIGH (ref 11.5–15.5)
WBC: 22.5 10*3/uL — ABNORMAL HIGH (ref 4.0–10.5)
nRBC: 2 % — ABNORMAL HIGH (ref 0.0–0.2)

## 2022-02-26 LAB — TYPE AND SCREEN
ABO/RH(D): A POS
Antibody Screen: NEGATIVE
Unit division: 0

## 2022-02-26 LAB — COOXEMETRY PANEL
Carboxyhemoglobin: 1.6 % — ABNORMAL HIGH (ref 0.5–1.5)
Carboxyhemoglobin: 1.4 % (ref 0.5–1.5)
Methemoglobin: 1.3 % (ref 0.0–1.5)
Methemoglobin: <0.7 % (ref 0.0–1.5)
O2 Saturation: 77.8 %
O2 Saturation: 60 %
Total hemoglobin: <6.9 g/dL — CL (ref 12.0–16.0)
Total hemoglobin: 11.3 g/dL — ABNORMAL LOW (ref 12.0–16.0)

## 2022-02-26 LAB — RENAL FUNCTION PANEL
Albumin: 2.3 g/dL — ABNORMAL LOW (ref 3.5–5.0)
Albumin: 2.1 g/dL — ABNORMAL LOW (ref 3.5–5.0)
Anion gap: 12 (ref 5–15)
Anion gap: 12 (ref 5–15)
BUN: 12 mg/dL (ref 6–20)
BUN: 9 mg/dL (ref 6–20)
CO2: 23 mmol/L (ref 22–32)
CO2: 20 mmol/L — ABNORMAL LOW (ref 22–32)
Calcium: 6.9 mg/dL — ABNORMAL LOW (ref 8.9–10.3)
Calcium: 6.5 mg/dL — ABNORMAL LOW (ref 8.9–10.3)
Chloride: 103 mmol/L (ref 98–111)
Chloride: 105 mmol/L (ref 98–111)
Creatinine, Ser: 1.81 mg/dL — ABNORMAL HIGH (ref 0.61–1.24)
Creatinine, Ser: 1.8 mg/dL — ABNORMAL HIGH (ref 0.61–1.24)
GFR, Estimated: 45 mL/min — ABNORMAL LOW (ref >60)
GFR, Estimated: 46 mL/min — ABNORMAL LOW (ref >60)
Glucose, Bld: 95 mg/dL (ref 70–99)
Glucose, Bld: 111 mg/dL — ABNORMAL HIGH (ref 70–99)
Phosphorus: 4 mg/dL (ref 2.5–4.6)
Phosphorus: 4.7 mg/dL — ABNORMAL HIGH (ref 2.5–4.6)
Potassium: 4.9 mmol/L (ref 3.5–5.1)
Potassium: 5.4 mmol/L — ABNORMAL HIGH (ref 3.5–5.1)
Sodium: 138 mmol/L (ref 135–145)
Sodium: 137 mmol/L (ref 135–145)

## 2022-02-26 LAB — GLUCOSE, CAPILLARY
Glucose-Capillary: 107 mg/dL — ABNORMAL HIGH (ref 70–99)
Glucose-Capillary: 112 mg/dL — ABNORMAL HIGH (ref 70–99)
Glucose-Capillary: 49 mg/dL — ABNORMAL LOW (ref 70–99)
Glucose-Capillary: 49 mg/dL — ABNORMAL LOW (ref 70–99)
Glucose-Capillary: 51 mg/dL — ABNORMAL LOW (ref 70–99)
Glucose-Capillary: 55 mg/dL — ABNORMAL LOW (ref 70–99)
Glucose-Capillary: 58 mg/dL — ABNORMAL LOW (ref 70–99)
Glucose-Capillary: 62 mg/dL — ABNORMAL LOW (ref 70–99)
Glucose-Capillary: 91 mg/dL (ref 70–99)
Glucose-Capillary: 95 mg/dL (ref 70–99)

## 2022-02-26 LAB — POCT I-STAT 7, (LYTES, BLD GAS, ICA,H+H)
Acid-base deficit: 6 mmol/L — ABNORMAL HIGH (ref 0.0–2.0)
Bicarbonate: 21.9 mmol/L (ref 20.0–28.0)
Calcium, Ion: 1.01 mmol/L — ABNORMAL LOW (ref 1.15–1.40)
HCT: 22 % — ABNORMAL LOW (ref 39.0–52.0)
Hemoglobin: 7.5 g/dL — ABNORMAL LOW (ref 13.0–17.0)
O2 Saturation: 90 %
Patient temperature: 36.3
Potassium: 4.6 mmol/L (ref 3.5–5.1)
Sodium: 139 mmol/L (ref 135–145)
TCO2: 24 mmol/L (ref 22–32)
pCO2 arterial: 59.2 mmHg — ABNORMAL HIGH (ref 32–48)
pH, Arterial: 7.172 — CL (ref 7.35–7.45)
pO2, Arterial: 72 mmHg — ABNORMAL LOW (ref 83–108)

## 2022-02-26 LAB — PROTIME-INR
INR: 2 — ABNORMAL HIGH (ref 0.8–1.2)
Prothrombin Time: 22.9 seconds — ABNORMAL HIGH (ref 11.4–15.2)

## 2022-02-26 LAB — BPAM RBC
Blood Product Expiration Date: 202303132359
ISSUE DATE / TIME: 202303062011
Unit Type and Rh: 6200

## 2022-02-26 LAB — HEPARIN LEVEL (UNFRACTIONATED): Heparin Unfractionated: <0.1 IU/mL — ABNORMAL LOW (ref 0.30–0.70)

## 2022-02-26 LAB — MAGNESIUM: Magnesium: 2.4 mg/dL (ref 1.7–2.4)

## 2022-02-26 LAB — LACTATE DEHYDROGENASE: LDH: 1090 U/L — ABNORMAL HIGH (ref 98–192)

## 2022-02-26 MED ORDER — SODIUM BICARBONATE 8.4 % IV SOLN
100.0000 meq | Freq: Once | INTRAVENOUS | Status: AC
Start: 2022-02-26 — End: 2022-02-26
  Administered 2022-02-26: 100 meq via INTRAVENOUS
  Filled 2022-02-26: qty 100

## 2022-02-27 MED FILL — Lidocaine HCl (Cardiac) IV PF Soln 100 MG/5ML (2%): INTRAVENOUS | Qty: 5 | Status: AC

## 2022-02-27 MED FILL — Sodium Bicarbonate IV Soln 8.4%: INTRAVENOUS | Qty: 50 | Status: AC

## 2022-02-27 MED FILL — Heparin Sodium (Porcine) Inj 1000 Unit/ML: INTRAMUSCULAR | Qty: 30 | Status: AC

## 2022-02-27 MED FILL — Heparin Sodium (Porcine) Inj 1000 Unit/ML: INTRAMUSCULAR | Qty: 20 | Status: AC

## 2022-02-27 MED FILL — Mannitol IV Soln 20%: INTRAVENOUS | Qty: 500 | Status: AC

## 2022-02-27 MED FILL — Sodium Chloride IV Soln 0.9%: INTRAVENOUS | Qty: 3000 | Status: AC

## 2022-02-27 MED FILL — Electrolyte-R (PH 7.4) Solution: INTRAVENOUS | Qty: 3000 | Status: AC

## 2022-03-01 LAB — CULTURE, BLOOD (ROUTINE X 2)
Culture: NO GROWTH
Culture: NO GROWTH
Special Requests: ADEQUATE
Special Requests: ADEQUATE

## 2022-03-05 ENCOUNTER — Telehealth (HOSPITAL_COMMUNITY): Payer: Self-pay | Admitting: Licensed Clinical Social Worker

## 2022-03-05 NOTE — Telephone Encounter (Signed)
CSW contacted patient's mother to offer support and follow up with grief resources. Message left. Lasandra Beech, LCSW, CCSW-MCS [phone] ? ?

## 2022-03-20 LAB — ECHO INTRAOPERATIVE TEE
AR max vel: 2.82 cm2
AV Area VTI: 2.51 cm2
AV Area mean vel: 2.52 cm2
AV Mean grad: 9 mmHg
AV Peak grad: 17.6 mmHg
Ao pk vel: 2.1 m/s
Height: 67 in
MV VTI: 6.9 cm2
Weight: 3731.95 oz

## 2022-03-23 NOTE — TOC CM/SW Note (Signed)
HF TOC CM spoke to pt's mother and dtr and updated pt's disability paperwork was completed. She is requesting a copy. TOC CM sent message to LVAD RN. Explained to mother to follow up on other benefits that his employer provides such as hospital indemnity or critical illness policy. Isidoro Donning RN3 CCM, Heart Failure TOC CM [phone]  ?

## 2022-03-23 NOTE — Progress Notes (Signed)
Patient ID: Colin Walton, male   DOB: 1971-03-09, 51 y.o.   MRN: [id number] ? ? ? ?Progress Note from the Palliative Medicine Team at [facility] ? ? ?Patient Name: Colin Walton        ?Date: 03-06-2022 ?DOB: September 23, 1971  Age: 51 y.o. MRN#: [id number] ?Attending Physician: Jolaine Artist, MD ?Primary Care Physician: Riki Sheer, NP ?Admit Date: 02/14/2022 ? ? ?Medical records reviewed  ? ?51 y.o. male   admitted on 01/30/2022 with acute on chronic biventricular heart failure secondary to NICM.  Moderate-severe aortic regurgitation, hypertension, OSA on CPAP obesity, h/o tobacco/EtOH use, gout.  Admitted for treatment and stabilization.  ? ?LVAD placed 03/18/2022.  Unfortunately patient has continued to decline within the context of full medical support. ? ?This NP visited patient at the bedside as a follow up for palliative medicine needs and emotional support.  Family has been updated by the heart failure team on patient's limited prognosis. ? ?Decision made with attending team to continue with current supportive measures until patient passes. ? ?Family is gathered at the bedside, anticipating the passing of "[name] their father, son and friend. ? ?Education offered on the natural trajectory and expectations at end-of-life, even as patient is fully supported with medical interventions. ? ?Encouragement and positive thoughts offered to patient and family.  Chaplain support involved. ? ?Questions and concerns addressed   ? ? ?Wadie Lessen NP  ?Palliative Medicine Team Team Phone # [phone] ?Pager [phone] ?  ?

## 2022-03-23 NOTE — Progress Notes (Signed)
? ?  Patient with continues deterioration throughout the day despite full support.  ? ?Now with MSOF and severe vasoplegia.  ? ?Discussed with family and decision made to proceed with transition to comfort care.  ? ?CVVHD and inhaled NO disconnected.  ? ?With family at bedside, we bolused fentanyl and versed to ensure comfort. All drips stopped. I personally extubated patient and VAD coordinators deactivated LVAD.  ? ?Patient quickly became apneic and pulseless.  ? ?I pronounced him at 5:33pm.  ? ?Additional CCT 45 mins.  ? ?Arvilla Meres, MD  ?5:47 PM ? ?

## 2022-03-23 NOTE — Progress Notes (Signed)
ANTICOAGULATION CONSULT NOTE ? ?Pharmacy Consult for heparin  ?Indication:  LVAD HM3 implanted 3/3 ? ?No Known Allergies ? ?Patient Measurements: ?Height: 5\' 7"  (170.2 cm) ?Weight: 119.4 kg (263 lb 3.7 oz) ?IBW/kg (Calculated) : 66.1 ? ?Vital Signs: ?Temp: 98.4 ?F (36.9 ?C) (03/07 0815) ?Pulse Rate: 89 (03/07 0815) ? ?Labs: ?Recent Labs  ?  02/24/22 ?0412 02/24/22 ?0418 02/25/22 ?0433 02/25/22 ?1155 02/25/22 ?1602 02/25/22 ?1807 02/25/22 ?2305 03/02/2022 ?0230 03/09/2022 ?0420 03/07/2022 ?0421  ?HGB 7.9*   < > 8.1*   < >  --    < > 8.1* 7.5* 7.9*  --   ?HCT 24.1*   < > 24.2*   < >  --    < > 25.1* 22.0* 24.2*  --   ?PLT 158   < > 142*  --   --   --  121*  --  PLATELET CLUMPS NOTED ON SMEAR, UNABLE TO ESTIMATE  --   ?LABPROT 15.8*  --  15.4*  --   --   --   --   --  22.9*  --   ?INR 1.3*  --  1.2  --   --   --   --   --  2.0*  --   ?HEPARINUNFRC  --   --  <0.10*  --   --   --   --   --   --  <0.10*  ?CREATININE 1.68*   < > 1.62*  --  1.75*  --   --   --   --  1.81*  ? < > = values in this interval not displayed.  ? ? ? ?Estimated Creatinine Clearance: 61 mL/min (A) (by C-G formula based on SCr of 1.81 mg/dL (H)). ? ? ?Medical History: ?Past Medical History:  ?Diagnosis Date  ? Chronic systolic CHF (congestive heart failure) (HCC)   ? Hyperlipidemia   ? Hypertension   ? Mild mitral regurgitation 2016  ? NICM (nonischemic cardiomyopathy) (HCC)   ? Obesity   ? Sleep apnea 12/24/12  ? Sleep study February 2014  ? ? ? ? ?Assessment: ?52 yoM with CHF now s/p LVAD HM3 implant 3/3. Pt with postop coagulopathy requiring novo7. Low dose heparin started 3/5. ? ?Heparin level undetectable as expected, H/H low but stable, pltc clumped, LDH >1000. INR 2 this morning. ? ? ?Goal of Therapy:  ?Heparin level < 0.3 for now immediately post op ?INR 2-2.5 ?Monitor platelets by anticoagulation protocol: Yes ?  ?Plan:  ?Heparin 500 units/h ?Daily heparin level and CBC ? ? ?54, PharmD, BCPS, BCCP ?Clinical Pharmacist ?[phone] ?Please  check AMION for all [facility] numbers ?03/21/2022 ? ? ? ?

## 2022-03-23 NOTE — Progress Notes (Signed)
Brief Nutrition Note:  ? ?Pt remains on vent support, on CRRT, profound vasoplegia post VAD that is worsening.  ?MAP in 40s most of the day despite max medical therapy.  ?RD discontinued TF order as not appropriate at this time.  ? ?Noted poor prognosis at this time, unlikely to survive the day, possible comfort care today.  ? ?Romelle Starcher MS, RDN, LDN, CNSC ?Registered Dietitian III ?Clinical Nutrition ?RD Pager and On-Call Pager Number Located in [place]  ? ?

## 2022-03-23 NOTE — Progress Notes (Signed)
PT Cancellation Note ? ?Patient Details ?Name: Colin Walton ?MRN: [id number] ?DOB: 1971-05-27 ? ? ?Cancelled Treatment:    Reason Eval/Treat Not Completed: Patient not medically ready Holding PT eval as pt with MAPs in 40s and maxed out on meds, not appropriate for therapy at this time. Will follow. ? ? ?[name] ?03/10/2022, 11:18 AM ?[place], PT, DPT ?Acute Rehabilitation Services ?Pager [phone] ?Office [phone] ? ? ? ?

## 2022-03-23 NOTE — Progress Notes (Signed)
eLink Physician-Brief Progress Note ?Patient Name: Colin Walton ?DOB: March 12, 1971 ?MRN: [id number] ? ? ?Date of Service ? 03/03/2022  ?HPI/Events of Note ? Hypoglycemia - Blood glucose = 49.  ?eICU Interventions ? Plan: ?Increase D10W IV infusion to 50 mL/hour.  ? ? ? ?Intervention Category ?Major Interventions: Other: ? ?Ennio Houp Dennard Nip ?03/04/2022, 1:10 AM ?

## 2022-03-23 NOTE — Progress Notes (Signed)
This chaplain is present for F/U spiritual care with the Pt. family. The chaplain listened reflectively as the Pt. daughter-[name] emotionally shared pieces of her story with the Pt.  ? ?The chaplain is appreciative of the RN attentiveness to the family in addition to Pt. care. ? ?Chaplain Stephanie Acre ?[phone] ?

## 2022-03-23 NOTE — Death Summary Note (Addendum)
°  Advanced Heart Failure Death Summary  Death Summary   Patient ID: Colin Walton MRN: [id number], DOB/AGE: 28-Nov-1971 51 y.o. Admit date: 02/17/2022 D/C date:     06-Mar-2022   Primary Discharge Diagnoses:  Acute on Chronic End-Stage Biventricular Systolic Heart Failure, NYHA Class IV w/ Low-Output (Inotrope Dependent) S/p HM3 LVAD  Obstructive CAD s/p CABG  Severe Aortic Regurgitation s/p Bioprosthetic Aortic Valve Replacement  Perioperative VT/VF Arrest  Acute Blood Loss Anemia/ Coagulopathy Requiring multiple blood products  Severe Refractory Distributive Shock/Vasoplegia despite high dose pressor support  Multi-System Organ Failure Acute Hypoxic Respiratory Failure Requiring Mechanical Ventilation  Acute Anuric Renal Failure Requiring CVVHD Post-operative Atrial Fibrillation  COPD Tobacco Abuse  Hypertension  OSA Obesity    Hospital Course:   51 y/o male w/ chronic systolic heart failure/ NICM, dating back to 2013, hypertension, aortic insuffiencey, OSA, h/o tobacco and ETOH use and COPD.  - cath 03/2013 no CAD - Echo 11/20 EF 30-35% - Echo 4/21 EF 20-25% moderate RV dysfunction (worse since previous)  - TEE on 04/18/20. Procedure c/b by severe respiratory distress and laryngospasm. LVEF 25% RV moderately HK. Aortic valve not interrogated completely due to respiratory distress. AI appeared to be moderate.  - cMRI 8/21 EF 24% Moderate AI. No LGE or infiltrative process. - Echo 03/22/21 EF 25-30% mod AI Moderate RV dysfunction  Admitted on 02/12/2022 for a/c CHF w/ marked volume overload, NYHA Class IV symptoms and signs of low output. Started on IV lasix and milrinone. Echo EF < 20% RV severely HK. AI moderate to severe. Felt to be end-staged heart failure needing advanced therapies. Not candidate for cardiac transplant given active tobacco use and COPD. VAD w/u initiated. RHC/LHC on milrinone 0.125 mcg + Norepi 2 mcg w/ 2v CAD, Severe NICM EF < 20%, Elevated filling pressures with  markedly reduced CO with severe AI. He was further optimized w/ up titration of milrinone and continued diuresis prior to VAD. Also underwent multiple dental extractions before implant given dental carries.   Went to OR on 3/3 for HM3- LVAD + bioprosthetic AVR and CABG x 1 w/ SVG (off outflow graft) to RCA. Complicated OR case with multiple episodes VT/VF. Persistently coagulopathic. Given multiple units of product in OR + 1/2 dose Factor 7.   Post-operative course c/b severe vasoplegia and distributive shock with MSOF, including hypoxic respiratory failure, unable to wean off vent and anuric renal failure requiring CVVHD. He was placed empirically on broad spectrum antibiotics. Blood cultures no growth. He continued w/ profound, refractory, vasoplegia despite maximum support on multiple pressors and failed methylene blue. Given refractory shock, exhausting all measures, family made decision on 3/7 to withdraw care. CVVHD and inhaled NO disconnected. He was bolused w/ fentanyl and versed to ensure comfort.  All drips discontinued. He was extubated and LVAD deactivated. Patient quickly became apneic and pulseless. TOD 5:33 pm, Mar 06, 2022.  Significant Diagnostic Studies Echo 02/16/2022: EF < 20% RV severely HK. AI moderate to severe [facility] 02/13/2022: (on milrinone 0.125 mcg + Norepi 2 mcg) 2v CAD, Severe NICM EF < 20%, Elevated filling pressures with markedly reduced CO with severe AI  Consultations  Advanced Heart Failure CT Surgery  Pulmonary/Critical Care Medicine Nephrology  Palliative Care   Duration of Discharge Encounter: Greater than 35 minutes   Signed, Lyda Jester, PA-C 02/27/2022, 7:16 AM   Agree with above.   [name], MD  10:39 PM

## 2022-03-23 NOTE — Progress Notes (Signed)
OT Cancellation Note ? ?Patient Details ?Name: Colin Walton ?MRN: [id number] ?DOB: 09-20-1971 ? ? ?Cancelled Treatment:    Reason Eval/Treat Not Completed: Medical issues which prohibited therapy. Holding OT eval as pt with MAPs in 40s and maxed out on meds. Will return as schedule allows.  ? ?Colin Grist Anden Bartolo ?Loraina Stauffer MSOT, OTR/L ?Acute Rehab ?Pager: [phone] ?Office: [phone] ?03/17/2022, 12:40 PM ?

## 2022-03-23 NOTE — Progress Notes (Addendum)
Patient ID: Colin Walton, male   DOB: April 13, 1971, 51 y.o.   MRN: [id number]     Advanced Heart Failure Rounding Note  PCP-Cardiologist: Lauree Chandler, MD   Subjective:    2/17 admitted for a/c Biventricular HF, NYHA IIIb-IV symptoms, w/ low output. Stated on milrinone. LVAD work up initiated.  2/23 RHC/LHC on milrinone 0.125 mcg + Norepi 2 mcg w/ 2v CAD, Severe NICM EF < 20%, Elevated filling pressures with markedly reduced CO with severe AI 2/28 diuretics held for AKI  3/1 Multiple teeth extractions  3/3 HM-3 VAD placed with bioprosthetic AVR and SVG (off outflow graft) to RCA. Complicated OR case with multiple episodes VT/VF. Persistently coagulopathic. 3/5 CVVH started. Received methylene blue   Remains critically ill. Continues to struggle w/ profound vasoplegia despite high pressor support, currently on Epi 12, NE 100, NEO 45, VP 0.04. SVR remains in the 500s.   Intubated and sedated. FiO2 100%   Swan #s: CVP 18 PA 50/27 (37) PAPI 1.43  CI 3.14 Co-ox 60%  3.2L in fluid removal yesterday off CVVH. Unable to pull currently w/ low BP.   WBCs 27 => 38.5 => 26=>22K. AF.  On empiric vancomycin/cefepime.     LVAD Interrogation HM 3: Speed: 5600 Flow: 4.8 PI: 3.9 Power: 4.2. No PI events   Objective:   Weight Range: 119.4 kg Body mass index is 41.23 kg/m.   Vital Signs:   Temp:  [96.3 F (35.7 C)-98.8 F (37.1 C)] 98.8 F (37.1 C) (03/07 0715) Pulse Rate:  [78-98] 83 (03/07 0715) Resp:  [9-33] 32 (03/07 0715) BP: (77)/(59) 77/59 (03/06 2030) SpO2:  [86 %-97 %] 86 % (03/07 0757) Arterial Line BP: (52-88)/(44-69) 52/45 (03/07 0715) FiO2 (%):  [80 %-100 %] 100 % (03/07 0757) Last BM Date : 03/15/2022  Weight change: Filed Weights   02/23/22 0645 02/24/22 0600 02/25/22 0630  Weight: 115.2 kg 117 kg 119.4 kg    Intake/Output:   Intake/Output Summary (Last 24 hours) at 03/17/22 0807 Last data filed at 03/17/2022 0700 Gross per 24 hour  Intake 5578.51 ml   Output 3547 ml  Net 2031.51 ml    Physical Exam   General: Critically ill, intubated and sedated on multiple drips  HEENT: + ETT, + cortrak  Neck: Supple, JVP 16 cm. + Rt IJ Swan Carotids OK.  Cardiac:  Mechanical heart sounds with LVAD hum present. + CTs  Lungs:  intubated, clear anteriorly  Abdomen:  NT, ND, no HSM. No bruits or masses. +BS  LVAD exit site: Well-healed and incorporated. Dressing dry and intact. No erythema or drainage. Stabilization device present and accurately applied. Driveline dressing changed daily per sterile technique. Extremities:  Warm and dry. No cyanosis, clubbing, rash. 1+ ankle edema. + SCDs  Neuro:  intubated and sedated    Telemetry   NSR vs accelerated junctional in 70s (personally reviewed)   Labs    CBC Recent Labs    02/25/22 0433 02/25/22 1155 02/25/22 2305 03/17/22 0230 2022-03-17 0420  WBC 25.9*  --  24.2*  --  22.5*  NEUTROABS 21.9*  --   --   --  17.9*  HGB 8.1*   < > 8.1* 7.5* 7.9*  HCT 24.2*   < > 25.1* 22.0* 24.2*  MCV 91.7  --  95.4  --  96.0  PLT 142*  --  121*  --  PLATELET CLUMPS NOTED ON SMEAR, UNABLE TO ESTIMATE   < > = values in this interval not displayed.  Basic Metabolic Panel Recent Labs    02/25/22 0433 02/25/22 1155 02/25/22 1602 02/25/22 1807 03/12/22 0230 Mar 12, 2022 0420 2022-03-12 0421  NA 136   < > 136   < > 139  --  138  K 5.0   < > 4.9   < > 4.6  --  4.9  CL 103  --  102  --   --   --  103  CO2 24  --  20*  --   --   --  23  GLUCOSE 102*  --  62*  --   --   --  95  BUN 16  --  14  --   --   --  12  CREATININE 1.62*  --  1.75*  --   --   --  1.81*  CALCIUM 7.9*  --  7.6*  --   --   --  6.9*  MG 2.4  --   --   --   --  2.4  --   PHOS 4.3  --  4.9*  --   --   --  4.0   < > = values in this interval not displayed.   Liver Function Tests Recent Labs    02/24/22 0412 02/24/22 1630 02/25/22 0433 02/25/22 1602 03-12-2022 0421  AST 124*  --  109*  --   --   ALT 25  --  17  --   --   ALKPHOS 56   --  59  --   --   BILITOT 4.4*  --  7.2*  --   --   PROT 5.1*  --  5.7*  --   --   ALBUMIN 2.7*   < > 2.9* 2.7* 2.3*   < > = values in this interval not displayed.     No results for input(s): LIPASE, AMYLASE in the last 72 hours. Cardiac Enzymes No results for input(s): CKTOTAL, CKMB, CKMBINDEX, TROPONINI in the last 72 hours.  BNP: BNP (last 3 results) Recent Labs    02/12/2022 1649 02/11/22 0345 02/23/22 0500  BNP 1,303.2* 166.8* 616.6*    ProBNP (last 3 results) No results for input(s): PROBNP in the last 8760 hours.   D-Dimer No results for input(s): DDIMER in the last 72 hours.  Hemoglobin A1C No results for input(s): HGBA1C in the last 72 hours.   Fasting Lipid Panel No results for input(s): CHOL, HDL, LDLCALC, TRIG, CHOLHDL, LDLDIRECT in the last 72 hours.  Thyroid Function Tests No results for input(s): TSH, T4TOTAL, T3FREE, THYROIDAB in the last 72 hours.  Invalid input(s): FREET3   Other results:   Imaging    DG Abd 1 View  Result Date: 02/25/2022 CLINICAL DATA:  Ileus EXAM: ABDOMEN - 1 VIEW COMPARISON:  02/16/2022 FINDINGS: Nonobstructive bowel gas pattern. Enteric tube terminates within the gastric body. Examination is somewhat limited secondary to poor penetration related to patient habitus. Partially visualized postsurgical changes within the chest, as detailed on same day dedicated chest radiograph. IMPRESSION: Nonobstructive bowel gas pattern. Enteric tube terminates within the gastric body. Electronically Signed   By: Davina Poke D.O.   On: 02/25/2022 10:28   DG CHEST PORT 1 VIEW  Result Date: Mar 12, 2022 CLINICAL DATA:  LVAD EXAM: PORTABLE CHEST 1 VIEW COMPARISON:  Yesterday FINDINGS: Cardiomegaly with CABG and aortic valve replacement.  LVAD. Swan-Ganz catheter from right IJ approach with tip at the right main pulmonary artery. Endotracheal tube with tip just below the clavicular heads. Feeding  and enteric tubes in place and at least reaching  the diaphragm. Chest tubes in place. Unchanged haziness of the bilateral chest with possible small pleural effusions. Apical emphysema which accounts for asymmetric lucency at the right apex. IMPRESSION: 1. Unremarkable hardware positioning. 2. Continued hazy appearance of the lungs likely reflecting edema. 3. Emphysema. Electronically Signed   By: Jorje Guild M.D.   On: March 06, 2022 06:58   DG Abd Portable 1V  Result Date: 02/25/2022 CLINICAL DATA:  Feeding tube placement. EXAM: PORTABLE ABDOMEN - 1 VIEW COMPARISON:  Abdominal x-ray 02/25/2022. FINDINGS: LVAD again noted unchanged in position. Prosthetic heart valve and median sternotomy wires are again seen. Heart is enlarged. Small left pleural effusion and left basilar infiltrates are unchanged. Right chest tube and mediastinal drain in place. Enteric tube tip is at the level of the gastric antrum. IMPRESSION: 1. Enteric tube tip at the level of the gastric antrum. 2. Stable cardiomegaly and postoperative changes including LVAD. 3. Stable left basilar atelectasis/airspace disease with small left pleural effusion. Electronically Signed   By: Ronney Asters M.D.   On: 02/25/2022 15:47     Medications:     Scheduled Medications:  sodium chloride   Intravenous Once   sodium chloride   Intravenous Once   allopurinol  300 mg Per Tube Daily   arformoterol  15 mcg Nebulization BID   aspirin EC  325 mg Oral Daily   Or   aspirin  324 mg Per Tube Daily   Or   aspirin  300 mg Rectal Daily   atorvastatin  80 mg Per Tube Daily   B-complex with vitamin C  1 tablet Per Tube Daily   chlorhexidine gluconate (MEDLINE KIT)  15 mL Mouth Rinse BID   Chlorhexidine Gluconate Cloth  6 each Topical Daily   docusate  100 mg Per Tube BID   feeding supplement (PROSource TF)  90 mL Per Tube BID   feeding supplement (VITAL 1.5 CAL)  1,000 mL Per Tube H29J   folic acid  1 mg Per Tube Daily   mouth rinse  15 mL Mouth Rinse 10 times per day   metoCLOPramide (REGLAN)  injection  5 mg Intravenous Q8H   pantoprazole (PROTONIX) IV  40 mg Intravenous QHS   polyethylene glycol  17 g Per Tube Daily   revefenacin  175 mcg Nebulization Daily   sennosides  10 mL Per Tube Daily   sodium chloride flush  3 mL Intravenous Q12H    Infusions:   prismasol BGK 4/2.5 1,200 mL/hr at 2022-03-06 0715    prismasol BGK 4/2.5 600 mL/hr at 2022/03/06 0720   sodium chloride Stopped (02/25/22 1656)   sodium chloride     sodium chloride     amiodarone 30 mg/hr (03/06/2022 0700)   ceFEPime (MAXIPIME) IV 2 g (02/25/22 2221)   dexmedetomidine (PRECEDEX) IV infusion Stopped (02/23/22 1745)   dextrose 50 mL/hr at 2022/03/06 0801   epinephrine 12 mcg/min (2022/03/06 0700)   fentaNYL infusion INTRAVENOUS 175 mcg/hr (03/06/2022 0700)   heparin 500 Units/hr (2022/03/06 0700)   lactated ringers     lactated ringers 20 mL/hr at 06-Mar-2022 0700   midazolam 4 mg/hr (2022/03/06 0700)   norepinephrine (LEVOPHED) Adult infusion 100 mcg/min (06-Mar-2022 0759)   phenylephrine (NEO-SYNEPHRINE) Adult infusion 45 mcg/min (03/06/2022 0723)   prismasol BGK 4/2.5 2,000 mL/hr at 06-Mar-2022 0720   vancomycin Stopped (02/25/22 1557)   vasopressin 0.04 Units/min (03/06/2022 0700)    PRN Medications: sodium chloride, dextrose, fentaNYL, heparin, midazolam, morphine  injection, ondansetron (ZOFRAN) IV, oxyCODONE, sodium chloride flush, traMADol   Assessment/Plan   1. Acute on Chronic Biventricular Heart Failure-> cardiogenic shock - cMRI 8/21 EF 24% Moderate AI. No LGE or infiltrative process. - Echo 02/18/2022 EF < 20% with mild LV dilation, RV severely HK with moderate dilation and D-shaped septum, AI moderate to severe.  - Suspect CM related to HTN +/- ETOH. AI may be contributing.  - R/LHC 01/24/2022 3v CAD/ RHC with elevated filling pressures.  - Not transplant candidate due to smoking and lung disease - 3/3 HM-3 VAD placed with AVR and SVG (off outflow graft) to RCA - Continues w/ profound vasoplegia despite high pressor  support  - He remains on epinephrine 12, NE 100, NEO 45, vasopressin 0.04, NO 30 ppm. He had methylene blue on 3/5.  With low SVR, low grade fever and rising WBCs, also covering with broad spectrum abx.  - Now on CVVH with AKI and poor diuresis. Currently unable to pull w/ worsening hypotension.   2. HM-3 VAD - VAD interrogated personally. Parameters stable. - On heparin gtt.    3. Acute hypoxic/post-op respiratory failure - Pre-op PFTs with severe obstruction and restriction, emphysema on CT chest.  Significant underlying COPD - Remains on FiO2 100%, suspect very slow vent wean with volume overload and underlying lung disease. CCM managing vent. - Unable to pull volume off CVVH currently w/ hypotension   4. Acute coagulopathy - given multiple units of product in OR + 1/2 dose Factor 7 - follow closely - transfuse hgb < 8. Hgb 7.9 today.    5. Moderate-severe aortic regurgitation - Moderate to severe AI on echo 02/01/2022.  Suspect AI is not cause of cardiomyopathy but is likely making symptoms worse.  - Bioprosthetic AVR with LVAD.   6. CAD - cath 3v CAD LAD 40% D1 90%, OM 90% mRCA 80% - SVG from outflow graft to RCA at time of VAD  7. VT/VF - continue IV amio gtt 30 mg/hr.  - Keep K >4.0 Mg > 2.0    8. OSA   9. Atrial fibrillation - Noted post-op.  On amiodarone gtt now and ?NSR vs accelerated junctional rhythm.   10. ID - post-op fever Tm 100.4, WBCs 27 => 38.5 => 26.  May be post-op inflammatory state but with very low SVR/shock, will cover with broad spectrum abx. On cefepime/vanco.    11. AKI - CVVH begun 3/5   Remains critically ill w/ profound post-operative vasoplegia, refractory to high dose pressor support. Little left to offer. Family planning to arrive today. Anticipate withdrawal of care after family has a chance to visit.     Length of Stay: [address], PA-C  Mar 06, 2022, 8:07 AM  Advanced Heart Failure Team Pager [phone] (M-F; 7a - 5p)  Please  contact [facility] for night-coverage after hours (5p -7a ) and weekends on [email or web address]  Agree with above.   He remains hypotensive and markedly acidotic and vasoplegic despite multiple pressors, bicarb and methylene blue.   Still with low-grade fevers. On broad spectrum abx   VAD parameters ok   General: Critically ill appearing on vent  HEENT: normal  + ETT Neck: supple. RIJ swan Cor: LVAD hum.  + CTs  Lungs: Coarse Abdomen: obese + distended. No hepatosplenomegaly. No bruits or masses. Good bowel sounds. Driveline site clean. Anchor in place.  Extremities: Warm 3+ edema  Neuro: intubated/sedated  He has severe distributive shock with MSOF that has not been responsive to  any therapies. I think we have exhausted all options to try and save him. I have d/w family and Dr. Tamala Julian. Favor transition to comfort care.   VAD interrogated personally. Parameters stable.  CRITICAL CARE Performed by: [name]  Total critical care time: 45 minutes  Critical care time was exclusive of separately billable procedures and treating other patients.  Critical care was necessary to treat or prevent imminent or life-threatening deterioration.  Critical care was time spent personally by me (independent of midlevel providers or residents) on the following activities: development of treatment plan with patient and/or surrogate as well as nursing, discussions with consultants, evaluation of patient's response to treatment, examination of patient, obtaining history from patient or surrogate, ordering and performing treatments and interventions, ordering and review of laboratory studies, ordering and review of radiographic studies, pulse oximetry and re-evaluation of patient's condition.  [name], MD  12:13 PM

## 2022-03-23 NOTE — Progress Notes (Signed)
LVAD Coordinator Rounding Note: ? ?Admitted 01/29/2022 due to CHF.  ? ?HM 3 LVAD implanted on 02/25/2022 by Dr Laneta Simmers under DT criteria. ? ?3/5 CVVH started. Received methylene blue  ? ?Remains critically ill. Continues to struggle w/ profound vasoplegia despite high pressor support. ? ? ?Vital signs: ?Temp: 97 ?HR: 72 ?Doppler Pressure:not done ?Arterial line: 52/47 (49) ?O2 Sat: 83% on 100% FiO2 ?Wt: 263.2 lbs   ? ? ?LVAD interrogation reveals:  ? ?Speed: 5600 ?Flow: 4.8 ?Power:  4.2 ?PI: 3.9  ?Alarms: none ?Events:  none  ?Hematocrit: 24 ?Fixed speed: 5600 ?Low speed limit: 5300 ? ? ?Drive Line: Daily dressing changes by Nurse Alla Feeling or VAD coordinator. Next dressing change 03/15/2022. ? ?Labs:  ?LDH trend: 418>1,090 ? ?INR trend: 1.2>2.0 ? ?Anticoagulation Plan: ?-INR Goal: 2-2.5 ?-ASA Dose: 325 mg until INR therapeutic ? ?Blood Products:  ?- Intra op: ?2022/02/25: ?1 u PLT ?1 Cryo ?6 FFP ?1850 cc of cell saver ?02/23/22>>1 u PRBC ?02/24/22>>1 u PRBC ?02/25/22>>1 u PRBC ? ?Gtts: ?Levo: 100 mcg/min ?Fent: 175 mcg/hr ?Heparin 500 u/hr ?Versed 3 mg/hr ?Milrinone 0.125 mcg/kg/min ?Vaso 0.04 u/hr ?Amio 30 mg/hr ?Epi 12 mcg/min ?Neo 45 mcg/min ? ?Device: ?-N/A ? ?Arrythmias: VT/VF during surgery; Post-Op>>AF and VT ? ?Respiratory: intubated on 100 % FiO2 w/PEEP of 8 ? ?Renal:  ?-BUN/CRT: 16/1.62>1.81 ? ?Adverse Events on VAD: ?-CVVH started 02/24/22 ? ?Patient Education: ?Pt intubated/heavily sedated. Education inappropriate at this time.  ? ?Plan/Recommendations:  ? ?1. Call VAD coordinator for any VAD equipment or drive line issues ?2. Daily drive line dressing change per nurse champion or VAD coordinator. Next dressing change due 02/24/2022. ? ?Carlton Adam RN, BSN ?VAD Coordinator ?24/7 Pager [phone] ? ? ? ?

## 2022-03-23 NOTE — Progress Notes (Signed)
CSW met at bedside with patient's mother, adult children and family members. Patient's mother spoke at length about patient and the previous loss of her other son in 2020. She shared she is aware of the grief resources and stated she will plan to follow up with counseling as she "can't wrap my head around what is happening here". She stated that "J always wanted peace and he would not want to remain on all these machines". CSW provided supportive intervention and will be available as needed. Raquel Sarna, [place], [place] ? ?

## 2022-03-23 NOTE — Progress Notes (Signed)
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  His operative course has been complicated by V. tach/V-fib.  He is requiring high-dose norepinephrine and vasopressin epinephrine and milrinone.  He has been unresponsive to Lasix.  And CRRT was initiated on 02/24/2022.  Blood pressure 52/54 pulse 83 temperature 98.8 O2 sats 91% FiO2 90%  Sodium 138 potassium 4.9 chloride 103 CO2 23 BUN 12 creatinine 1.8 glucose 95 phosphorus 4 hemoglobin 7.9  Objective:  Vital signs in last 24 hours:  Temp:  [96.1 F (35.6 C)-98.8 F (37.1 C)] 98.8 F (37.1 C) (03/07 0715) Pulse Rate:  [78-98] 83 (03/07 0715) Resp:  [9-33] 32 (03/07 0715) BP: (77)/(59) 77/59 (03/06 2030) SpO2:  [86 %-97 %] 91 % (03/07 0715) Arterial Line BP: (52-89)/(44-69) 52/45 (03/07 0715) FiO2 (%):  [80 %-90 %] 90 % (03/07 0335)  Weight change:  Filed Weights   02/23/22 0645 02/24/22 0600 02/25/22 0630  Weight: 115.2 kg 117 kg 119.4 kg    Intake/Output: I/O last 3 completed shifts: In: 8016.3 [I.V.:7244.7; Blood:315; NG/GT:455; IV Piggyback:1.7] Out: 7142 [Urine:151; Emesis/NG output:700; Other:5721; Chest Tube:570]   Intake/Output this shift:  No intake/output data recorded.  CVS- RRR RS- CTA ETT in place ABD- BS present soft non-distended EXT-anasarca right femoral hemodialysis catheter   Basic Metabolic Panel: Recent Labs  Lab 02/23/22 0300 02/23/22 0645 02/23/22 1657 02/23/22 1804 02/24/22 0412 02/24/22 0418 02/24/22 1630 02/24/22 1800 02/25/22 0433 02/25/22 1155 02/25/22 1602 02/25/22 1807 02/25/22 2129 02/25/22 2304 2022/03/12 0230 03-12-22 0420 03/12/2022 0421  NA 133*   < > 133*   < > 132*   < > 135   < > 136   < > 136 139 144 138 139  --  138  K 5.7*   < > 5.4*   < > 5.5*   <  > 5.2*   < > 5.0   < > 4.9 4.7 3.8 4.7 4.6  --  4.9  CL 101   < > 101   < > 99  --  99  --  103  --  102  --   --   --   --   --  103  CO2 24   < > 24   < > 24  --  25  --  24  --  20*  --   --   --   --   --  23  GLUCOSE 132*   < > 134*   < > 114*  --  121*  --  102*  --  62*  --   --   --   --   --  95  BUN 14   < > 16   < > 21*  --  22*  --  16  --  14  --   --   --   --   --  12  CREATININE 1.07   < > 1.33*   < > 1.68*  --  2.13*  --  1.62*  --  1.75*  --   --   --   --   --  1.81*  CALCIUM 8.2*   < > 8.1*   < > 8.2*  --  7.7*  --  7.9*  --  7.6*  --   --   --   --   --  6.9*  MG 2.3  --  2.0  --  2.0  --   --   --  2.4  --   --   --   --   --   --  2.4  --   PHOS 3.9  --   --   --  5.3*  --  5.8*  --  4.3  --  4.9*  --   --   --   --   --  4.0   < > = values in this interval not displayed.     Liver Function Tests: Recent Labs  Lab 02/23/22 0300 02/24/22 0412 02/24/22 1630 02/25/22 0433 02/25/22 1602 03-01-22 0421  AST 148* 124*  --  109*  --   --   ALT 30 25  --  17  --   --   ALKPHOS 52 56  --  59  --   --   BILITOT 3.8* 4.4*  --  7.2*  --   --   PROT 5.0* 5.1*  --  5.7*  --   --   ALBUMIN 3.4* 2.7* 2.9* 2.9* 2.7* 2.3*    No results for input(s): LIPASE, AMYLASE in the last 168 hours. No results for input(s): AMMONIA in the last 168 hours.  CBC: Recent Labs  Lab 02/23/22 0300 02/23/22 0645 02/24/22 0412 02/24/22 0418 02/24/22 2029 02/25/22 0158 02/25/22 0433 02/25/22 1155 02/25/22 2129 02/25/22 2304 02/25/22 2305 Mar 01, 2022 0230 2022/03/01 0420  WBC 27.0*   < > 38.5*  --  30.9*  --  25.9*  --   --   --  24.2*  --  22.5*  NEUTROABS 23.0*  --  32.0*  --   --   --  21.9*  --   --   --   --   --  17.9*  HGB 8.7*   < > 7.9*   < > 8.1*   8.2*   < > 8.1*   < > 6.1* 8.2* 8.1* 7.5* 7.9*  HCT 26.6*   < > 24.1*   < > 24.1*   24.0*   < > 24.2*   < > 18.0* 24.0* 25.1* 22.0* 24.2*  MCV 90.5   < > 91.6  --  92.3  --  91.7  --   --   --  95.4  --  96.0  PLT 127*   < > 158  --   144*  --  142*  --   --   --  121*  --  PLATELET CLUMPS NOTED ON SMEAR, UNABLE TO ESTIMATE   < > = values in this interval not displayed.     Cardiac Enzymes: No results for input(s): CKTOTAL, CKMB, CKMBINDEX, TROPONINI in the last 168 hours.  BNP: Invalid input(s): POCBNP  CBG: Recent Labs  Lab March 01, 2022 0008 01-Mar-2022 0048 03-01-2022 0228 01-Mar-2022 0404 2022-03-01 0605  GLUCAP 49* 95 58* 91 41*     Microbiology: Results for orders placed or performed during the hospital encounter of 01/23/2022  Resp Panel by RT-PCR (Flu A&B, Covid) Nasopharyngeal Swab     Status: None   Collection Time: 01/29/2022  4:09 PM   Specimen: Nasopharyngeal Swab; Nasopharyngeal(NP) swabs in vial transport medium  Result Value Ref Range Status   SARS Coronavirus 2 by RT PCR NEGATIVE NEGATIVE Final    Comment: (Walton) SARS-CoV-2 target nucleic acids are NOT DETECTED.  The SARS-CoV-2 RNA is generally detectable in upper respiratory specimens during the acute phase of infection.  The lowest concentration of SARS-CoV-2 viral copies this assay can detect is 138 copies/mL. A negative result does not preclude SARS-Cov-2 infection and should not be used as the sole basis for treatment or other patient management decisions. A negative result may occur with  improper specimen collection/handling, submission of specimen other than nasopharyngeal swab, presence of viral mutation(s) within the areas targeted by this assay, and inadequate number of viral copies(<138 copies/mL). A negative result must be combined with clinical observations, patient history, and epidemiological information. The expected result is Negative.  Fact Sheet for Patients:  EntrepreneurPulse.com.au  Fact Sheet for Healthcare Providers:  IncredibleEmployment.be  This test is no t yet approved or cleared by the [place] FDA and  has been authorized for detection and/or diagnosis of SARS-CoV-2 by FDA  under an Emergency Use Authorization (EUA). This EUA will remain  in effect (meaning this test can be used) for the duration of the COVID-19 declaration under Section 564(b)(1) of the Act, 21 U.S.C.section 360bbb-3(b)(1), unless the authorization is terminated  or revoked sooner.       Influenza A by PCR NEGATIVE NEGATIVE Final   Influenza B by PCR NEGATIVE NEGATIVE Final    Comment: (Walton) The Xpert Xpress SARS-CoV-2/FLU/RSV plus assay is intended as an aid in the diagnosis of influenza from Nasopharyngeal swab specimens and should not be used as a sole basis for treatment. Nasal washings and aspirates are unacceptable for Xpert Xpress SARS-CoV-2/FLU/RSV testing.  Fact Sheet for Patients: EntrepreneurPulse.com.au  Fact Sheet for Healthcare Providers: IncredibleEmployment.be  This test is not yet approved or cleared by the [place] FDA and has been authorized for detection and/or diagnosis of SARS-CoV-2 by FDA under an Emergency Use Authorization (EUA). This EUA will remain in effect (meaning this test can be used) for the duration of the COVID-19 declaration under Section 564(b)(1) of the Act, 21 U.S.C. section 360bbb-3(b)(1), unless the authorization is terminated or revoked.  Performed at [facility], [place] [address], [place], [place] [address]   MRSA Next Gen by PCR, Nasal     Status: None   Collection Time: 02/15/2022  4:32 PM   Specimen: Nasal Mucosa; Nasal Swab  Result Value Ref Range Status   MRSA by PCR Next Gen NOT DETECTED NOT DETECTED Final    Comment: (Walton) The GeneXpert MRSA Assay (FDA approved for NASAL specimens only), is one component of a comprehensive MRSA colonization surveillance program. It is not intended to diagnose MRSA infection nor to guide or monitor treatment for MRSA infections. Test performance is not FDA approved in patients less than 38 years old. Performed at [facility], [place]  [address] [place], [place] [address]   MRSA Next Gen by PCR, Nasal     Status: None   Collection Time: 02/21/22  8:36 PM   Specimen: Nasal Mucosa; Nasal Swab  Result Value Ref Range Status   MRSA by PCR Next Gen NOT DETECTED NOT DETECTED Final    Comment: (Walton) The GeneXpert MRSA Assay (FDA approved for NASAL specimens only), is one component of a comprehensive MRSA colonization surveillance program. It is not intended to diagnose MRSA infection nor to guide or monitor treatment for MRSA infections. Test performance is not FDA approved in patients less than 3 years old. Performed at [facility], [place] [address], [place], [place] [address]   Culture, blood (routine x 2)     Status: None (Preliminary result)   Collection Time: 02/24/22 11:01 AM   Specimen:  BLOOD RIGHT HAND  Result Value Ref Range Status   Specimen Description BLOOD RIGHT HAND  Final   Special Requests   Final    BOTTLES DRAWN AEROBIC AND ANAEROBIC Blood Culture adequate volume   Culture   Final    NO GROWTH < 24 HOURS Performed at [facility], [address], [place], [place] [address]    Report Status PENDING  Incomplete  Culture, blood (routine x 2)     Status: None (Preliminary result)   Collection Time: 02/24/22 11:01 AM   Specimen: BLOOD  Result Value Ref Range Status   Specimen Description BLOOD RIGHT FEMORAL  Final   Special Requests   Final    BOTTLES DRAWN AEROBIC AND ANAEROBIC Blood Culture adequate volume   Culture   Final    NO GROWTH < 24 HOURS Performed at [facility], [place] [address], [place], [place] [address]    Report Status PENDING  Incomplete    Coagulation Studies: Recent Labs    02/24/22 0412 02/25/22 0433 March 11, 2022 0420  LABPROT 15.8* 15.4* 22.9*  INR 1.3* 1.2 2.0*     Urinalysis: No results for input(s): COLORURINE, LABSPEC, PHURINE, GLUCOSEU, HGBUR, BILIRUBINUR, KETONESUR, PROTEINUR, UROBILINOGEN, NITRITE, LEUKOCYTESUR in the last 72 hours.  Invalid  input(s): APPERANCEUR    Imaging: DG Abd 1 View  Result Date: 02/25/2022 CLINICAL DATA:  Ileus EXAM: ABDOMEN - 1 VIEW COMPARISON:  02/11/2022 FINDINGS: Nonobstructive bowel gas pattern. Enteric tube terminates within the gastric body. Examination is somewhat limited secondary to poor penetration related to patient habitus. Partially visualized postsurgical changes within the chest, as detailed on same day dedicated chest radiograph. IMPRESSION: Nonobstructive bowel gas pattern. Enteric tube terminates within the gastric body. Electronically Signed   By: Davina Poke D.O.   On: 02/25/2022 10:28   DG CHEST PORT 1 VIEW  Result Date: 11-Mar-2022 CLINICAL DATA:  LVAD EXAM: PORTABLE CHEST 1 VIEW COMPARISON:  Yesterday FINDINGS: Cardiomegaly with CABG and aortic valve replacement.  LVAD. Swan-Ganz catheter from right IJ approach with tip at the right main pulmonary artery. Endotracheal tube with tip just below the clavicular heads. Feeding and enteric tubes in place and at least reaching the diaphragm. Chest tubes in place. Unchanged haziness of the bilateral chest with possible small pleural effusions. Apical emphysema which accounts for asymmetric lucency at the right apex. IMPRESSION: 1. Unremarkable hardware positioning. 2. Continued hazy appearance of the lungs likely reflecting edema. 3. Emphysema. Electronically Signed   By: Jorje Guild M.D.   On: 03-11-2022 06:58   DG CHEST PORT 1 VIEW  Result Date: 02/25/2022 CLINICAL DATA:  51 year old male with history of left ventricular assist device. EXAM: PORTABLE CHEST 1 VIEW COMPARISON:  Chest x-ray 02/24/2022. FINDINGS: LVAD projecting over the left ventricular apex. Right internal jugular Cordis through which a Swan-Ganz catheter has been passed into the distal pulmonic trunk. An endotracheal tube is in place with tip 6.2 cm above the carina. Bilateral chest tubes are stable in position with tips and side ports projecting over the thorax bilaterally. A  nasogastric tube is seen extending into the stomach, however, the tip of the nasogastric tube extends below the lower margin of the image. Status post median sternotomy for aortic valve replacement with a stented bioprosthesis. Lung volumes are low. Bibasilar opacities (left-greater-than-right) favored to predominantly reflect subsegmental atelectasis, although airspace consolidation in the left lower lobe is not excluded. Small left pleural effusion. Right costophrenic sulcus is incompletely imaged. Cephalization of the pulmonary vasculature  with slightly indistinct interstitial markings, concerning for mild interstitial pulmonary edema. Moderate cardiomegaly. IMPRESSION: 1. Postoperative changes and support apparatus, as above. 2. Mild interstitial pulmonary edema. 3. Moderate cardiomegaly. Electronically Signed   By: Vinnie Langton M.D.   On: 02/25/2022 07:52   DG Abd Portable 1V  Result Date: 02/25/2022 CLINICAL DATA:  Feeding tube placement. EXAM: PORTABLE ABDOMEN - 1 VIEW COMPARISON:  Abdominal x-ray 02/25/2022. FINDINGS: LVAD again noted unchanged in position. Prosthetic heart valve and median sternotomy wires are again seen. Heart is enlarged. Small left pleural effusion and left basilar infiltrates are unchanged. Right chest tube and mediastinal drain in place. Enteric tube tip is at the level of the gastric antrum. IMPRESSION: 1. Enteric tube tip at the level of the gastric antrum. 2. Stable cardiomegaly and postoperative changes including LVAD. 3. Stable left basilar atelectasis/airspace disease with small left pleural effusion. Electronically Signed   By: Ronney Asters M.D.   On: 02/25/2022 15:47     Medications:     prismasol BGK 4/2.5 1,200 mL/hr at 03-08-22 0715    prismasol BGK 4/2.5 600 mL/hr at Mar 08, 2022 0720   sodium chloride Stopped (02/25/22 1656)   sodium chloride     sodium chloride     amiodarone 30 mg/hr (03/08/2022 0700)   ceFEPime (MAXIPIME) IV 2 g (02/25/22 2221)    dexmedetomidine (PRECEDEX) IV infusion Stopped (02/23/22 1745)   dextrose 50 mL/hr at Mar 08, 2022 0700   epinephrine 12 mcg/min (08-Mar-2022 0700)   fentaNYL infusion INTRAVENOUS 175 mcg/hr (03/08/2022 0700)   heparin 500 Units/hr (2022/03/08 0700)   lactated ringers     lactated ringers 20 mL/hr at March 08, 2022 0700   midazolam 4 mg/hr (03/08/2022 0700)   norepinephrine (LEVOPHED) Adult infusion 100 mcg/min (03/08/2022 0700)   phenylephrine (NEO-SYNEPHRINE) Adult infusion 45 mcg/min (Mar 08, 2022 0723)   prismasol BGK 4/2.5 2,000 mL/hr at March 08, 2022 0720   vancomycin Stopped (02/25/22 1557)   vasopressin 0.04 Units/min (2022-03-08 0700)    sodium chloride   Intravenous Once   sodium chloride   Intravenous Once   allopurinol  300 mg Per Tube Daily   arformoterol  15 mcg Nebulization BID   aspirin EC  325 mg Oral Daily   Or   aspirin  324 mg Per Tube Daily   Or   aspirin  300 mg Rectal Daily   atorvastatin  80 mg Per Tube Daily   B-complex with vitamin C  1 tablet Per Tube Daily   chlorhexidine gluconate (MEDLINE KIT)  15 mL Mouth Rinse BID   Chlorhexidine Gluconate Cloth  6 each Topical Daily   docusate  100 mg Per Tube BID   feeding supplement (PROSource TF)  90 mL Per Tube BID   feeding supplement (VITAL 1.5 CAL)  1,000 mL Per Tube Z66Q   folic acid  1 mg Per Tube Daily   mouth rinse  15 mL Mouth Rinse 10 times per day   metoCLOPramide (REGLAN) injection  5 mg Intravenous Q8H   pantoprazole (PROTONIX) IV  40 mg Intravenous QHS   polyethylene glycol  17 g Per Tube Daily   revefenacin  175 mcg Nebulization Daily   sennosides  10 mL Per Tube Daily   sodium chloride flush  3 mL Intravenous Q12H   sodium chloride, dextrose, fentaNYL, heparin, midazolam, morphine injection, ondansetron (ZOFRAN) IV, oxyCODONE, sodium chloride flush, traMADol  Assessment/ Plan:  Acute kidney injury secondary to ATN after LVAD and AVR 02/27/2022 with severe persistent shock.  Initiated CRRT for 02/24/2022. Congestive  heart  failure biventricular failure prognosis poor.  LVAD in place Shock requiring multiple pressors Anemia transfuse as needed no ESA's at this point.  LOS: [place] @TODAY @7 :47 AM

## 2022-03-23 NOTE — Progress Notes (Signed)
Patient ID: Colin Walton, male   DOB: [birth year], 51 y.o.   MRN: [id number] ? ?TCTS ? ?He is in a downward spiral with multisystem organ failure with very low SVR and MAP 40's despite NE 100, neo 45, vaso 0.04 and epi 12. He is anuric on CRRT but can not remove volume due to low SVR. Vent dependent with sats 80's to 90 and PCO2 60  on 100% FiO2, 8 PEEP, RR 32. He has severe underlying lung disease. He is hypothermic and hypoglycemic. I agree with AHF and CCM that there is nothing else to offer this gentleman and this is not a survivable situation. Transition to comfort care is the best option now. I discussed with his mother who said she was very grateful for everyone's care. ? ? ?

## 2022-03-23 NOTE — Progress Notes (Signed)
? ?NAMEDaryn Hicks, MRN:  [id number], DOB:  [birth year], LOS: 18 ?ADMISSION DATE:  02/02/2022, CONSULTATION DATE:  02/23/22 ?REFERRING MD:  TCTS, CHIEF COMPLAINT:  SOB  ? ?History of Present Illness:  ?51 year old man w/ hx of HTN, NICM, OSA on CPAP, smoking/EtOH abuse who presented with decompensated heart failure. Despite optimization remained in a symptomatic low slow state so underwent CABG x 1 outflow graft to RCA, aortic valve replacement and Heartmate 3 LVAD on Mar 04, 2022.  Procedure complicated by persistent coagulopathy and multiple runs of VF/VT.  Required amio, multiple blood preducts.  Eventually weaned off ECMO and brought to [facility].  Overnight issues with sedation and vent desynchrony requiring multitude of pushes of versed/fentanyl ? ?PCCM consulted to assist with vent management. ? ?Preop workup ?CT Chest bullous upper lobe emphysema with lower lungs preserved ?PFTs look mixed obstructive/restrictive with partially correcting but still low DLCO ? ?Pertinent  Medical History  ?NICM ?HLD ?HTN ?OSA ? ?Significant Hospital Events: ?Including procedures, antibiotic start and stop dates in addition to other pertinent events   ?Copied from CHF team note ?2/17 admitted for a/c Biventricular HF, NYHA IIIb-IV symptoms, w/ low output. Stated on milrinone. LVAD work up initiated.  ?2/23 RHC/LHC on milrinone 0.125 mcg + Norepi 2 mcg w/ 2v CAD, Severe NICM EF < 20%, Elevated filling pressures with markedly reduced CO with severe AI ?2/28 diuretics held for AKI  ?3/1 Multiple teeth extractions  ?3/3 HM-3 VAD placed with AVR and SVG (off outflow graft) to RCA, multiple episodes VT/VF, persistently coagulopathic  ?3/5 CRRT started, methylene blue IVP briefly responded ?3/6  Afebrile, WBC improving, MAPs 70's, CVP 23, SVRs in 576, CI 3.2, CO 6.8, epi 9, NE 50, vaso 0.04, NO 30, amio 30, fent 250/ versed 6, milrinone stopped, unable to pull fluid on CRRT given hypotension, Neo added, no BS, increased OGT output, KUB c/w  ileus, cortrak placed but unable to get postplyoric ? ?Interim History / Subjective:  ?Continues to decline despite all aggressive care.  MAPs in 40s despite 4 pressors, SVR worsening 378, CI 3.8, CO 6.8, coox now in 60's, unable to pull any fluid with CRRT given hypotension, persistent hypoglycemia now on D10 ? ?Objective   ?Blood pressure (!) 77/59, pulse 89, temperature 98.4 ?F (36.9 ?C), resp. rate (!) 32, height 5\' 7"  (1.702 m), weight 119.4 kg, SpO2 (!) 86 %. ?PAP: (48-84)/(23-32) 50/28 ?CVP:  [13 mmHg-35 mmHg] 18 mmHg ?PCWP:  [27 mmHg-28 mmHg] 28 mmHg ?CO:  [6.3 L/min-10.1 L/min] 6.8 L/min ?CI:  [2.9 L/min/m2-4.7 L/min/m2] 3.1 L/min/m2  ?Vent Mode: PRVC ?FiO2 (%):  [80 %-100 %] 100 % ?Set Rate:  [32 bmp] 32 bmp ?Vt Set:  [520 mL] 520 mL ?PEEP:  [8 cmH20] 8 cmH20 ?Plateau Pressure:  [27 cmH20-29 cmH20] 28 cmH20  ? ?Intake/Output Summary (Last 24 hours) at 03/18/2022 0848 ?Last data filed at 02/25/2022 0800 ?Gross per 24 hour  ?Intake 5848.54 ml  ?Output 3552 ml  ?Net 2296.54 ml  ? ?Filed Weights  ? 02/23/22 0645 02/24/22 0600 02/25/22 0630  ?Weight: 115.2 kg 117 kg 119.4 kg  ? ?Examination: ?General:  critically ill adult male sedated on MV ?HEENT: MM pink/moist, ETT/ OGT, scleral icterus, pupils 4/reactive ?Neuro:  sedated, eyes open with mouth care/ suctioning, no spont movement ?CV:  junctional, mechanical hum present, LVAD with CT x 3 (pleural x 2, mediastinal) ?PULM:  MV supported breaths, clear/ diminished in bases, scant secretions, plat 27, dp 19 ?GI:  mildly distended, no  BS, foley  ?Extremities: warm/dry, anasarca   ? ?ABG 7.172/ 59/ 72/ 24 ?Labs: K 4.9, sCr 1.75> 1.81, Mag2.4, WBC 24> 22, Hgb 7.9 ?LDH 418> 1090 ?UOP 21 ml/ 24hrs ?449ml/ 24hr out from OGT ?Decreased CT output, cumulative 392ml/ 24hrs ? ?Resolved Hospital Problem list   ?N/A ? ?Assessment & Plan:  ?Postoperative ventilator management ?Mixed ischemic and nonischmemic CM, aortic regurgitation, Postop status 03/12/2022: HM-3 VAD placed with AVR  and SVG (off outflow graft) to RCA ?Post cardiotomy shock persistent, elevated WBC, abx added per CHF team 3/5 ?Postop agitation, pain, and delirium- stable ?Periop VT/VF- on amiodarone ?Hyperkalemia, progressive renal dysfunction, oliguria, volume overload- CRRT initiated 3/5 ?Baseline emphysema and mixed obstructive restrictive lung disease ?OSA on CPAP ?Baseline tobacco and EtOH abuse ?- per HF / TCTS ?- LVAD/ CTs per HF ?- continues to decompensate with refractory vasoplegia and MODS despite all aggressive interventions with high dose vasopressors x 4.  Family has been called in as nothing else to offer, unlikely he will survive the day despite continued full support.   ?- continue cefepime/ vanc.  Blood cultures with ngtd ?- CRRT per nephrology, unable to tolerate any fluid removal thus far, near anuric now ?- cont full MV support, now at FiO2 1.0/ peep 8, cont scheduled and prn BD ?- PAD protocol with fentanyl/ versed gtts w/bowel regimen ?- Unable to get cortrak post-plyoric yesterday with plans to start trickle feeds and keep OGT for decompression.  Remains NPO ?- continue D10 gtt  ? ?Mother updated at bedside this am by Dr. Katrinka Blazing.  ? ?CCT 35 mins ? ? ?Posey Boyer, ACNP ?[facility] ?03/28/22, 8:48 AM ? ?See Amion for pager ?If no response to pager, please call PCCM consult pager ?After 7:00 pm call Elink   ? ? ? ? ?

## 2022-03-23 NOTE — Progress Notes (Signed)
eLink Physician-Brief Progress Note ?Patient Name: Colin Walton ?DOB: 04-24-1971 ?MRN: [id number] ? ? ?Date of Service ? 03/05/2022  ?HPI/Events of Note ? ABG = 7.17/59/72/21.9.  ?eICU Interventions ? Plan: ?NaHCO3 100 meq IV now.   ? ? ? ?Intervention Category ?Major Interventions: Acid-Base disturbance - evaluation and management;Respiratory failure - evaluation and management ? ?[name] ?03/14/2022, 3:03 AM ?

## 2022-03-23 NOTE — Progress Notes (Signed)
Palliative:  Emotional support offered to family gathered for Colin Walton. They understand that he is actively dying. Mother Colin Walton understands and relies on her faith for strength. She expresses peace in knowing that Colin Walton will be in heaven and at peace when his time comes. She also expresses that she would not be able to be the person to make a decision to stop any interventions that could be prolonging him - discussed that sometimes the medical team makes this call for [place] if the interventions are no longer beneficial and causing pain/suffering. Emotional support provided.   No charge  Yong Channel, NP Palliative Medicine Team Pager [phone] (Please see [email or web address] for schedule) Team Phone [phone]

## 2022-03-23 NOTE — Progress Notes (Signed)
This chaplain responded to PMT referral for EOL spiritual care. The chaplain re-introduced herself to the Pt. mother-[name] and daughter-[name]. The chaplain understands the Pt. sons [name] and [name] are at the bedside also.  ? ?Colin Walton is offering support to the grandchildren and Colin Walton' friends are supporting her as she grieves the anticipated loss of her second son. The chaplain understands Colin Walton' faith in God supports her belief in healing or time of death. ?In the moments of reflective listening and storytelling, Colin Walton shares the Pt. lived by his favorite saying is "[name]". ? ?The chaplain offered F/U spiritual care and recognized the importance of time together with family. ? ?Chaplain Sallyanne Kuster ?[phone] ?

## 2022-03-23 DEATH — deceased
